# Patient Record
Sex: Female | Born: 1960
Health system: Southern US, Community
[De-identification: ages and names within clinical notes are randomized; demographics above are authoritative.]

## PROBLEM LIST (undated history)

## (undated) DIAGNOSIS — F1020 Alcohol dependence, uncomplicated: Secondary | ICD-10-CM

## (undated) DIAGNOSIS — Z87898 Personal history of other specified conditions: Secondary | ICD-10-CM

## (undated) DIAGNOSIS — F17200 Nicotine dependence, unspecified, uncomplicated: Secondary | ICD-10-CM

## (undated) HISTORY — PX: CRANIOTOMY FOR TUMOR: SUR345

---

## 2019-12-10 ENCOUNTER — Telehealth: Payer: Self-pay | Admitting: Physician Assistant

## 2019-12-10 ENCOUNTER — Inpatient Hospital Stay (HOSPITAL_COMMUNITY): Payer: Self-pay

## 2019-12-10 ENCOUNTER — Emergency Department (HOSPITAL_COMMUNITY): Payer: Self-pay

## 2019-12-10 ENCOUNTER — Encounter (HOSPITAL_COMMUNITY): Payer: Self-pay | Admitting: Neurology

## 2019-12-10 ENCOUNTER — Inpatient Hospital Stay (HOSPITAL_COMMUNITY)
Admission: EM | Admit: 2019-12-10 | Discharge: 2019-12-14 | DRG: 065 | Disposition: A | Discharge status: Rehabilitation Facility | Payer: Self-pay | Attending: Internal Medicine | Admitting: Internal Medicine

## 2019-12-10 DIAGNOSIS — F1020 Alcohol dependence, uncomplicated: Secondary | ICD-10-CM | POA: Diagnosis present

## 2019-12-10 DIAGNOSIS — R000 Tachycardia, unspecified: Secondary | ICD-10-CM | POA: Diagnosis present

## 2019-12-10 DIAGNOSIS — R4701 Aphasia: Secondary | ICD-10-CM | POA: Diagnosis present

## 2019-12-10 DIAGNOSIS — I618 Other nontraumatic intracerebral hemorrhage: Principal | ICD-10-CM | POA: Diagnosis present

## 2019-12-10 DIAGNOSIS — Z20822 Contact with and (suspected) exposure to covid-19: Secondary | ICD-10-CM | POA: Diagnosis present

## 2019-12-10 DIAGNOSIS — F17200 Nicotine dependence, unspecified, uncomplicated: Secondary | ICD-10-CM | POA: Diagnosis present

## 2019-12-10 DIAGNOSIS — Z8669 Personal history of other diseases of the nervous system and sense organs: Secondary | ICD-10-CM

## 2019-12-10 DIAGNOSIS — F17210 Nicotine dependence, cigarettes, uncomplicated: Secondary | ICD-10-CM | POA: Diagnosis present

## 2019-12-10 DIAGNOSIS — I629 Nontraumatic intracranial hemorrhage, unspecified: Secondary | ICD-10-CM

## 2019-12-10 DIAGNOSIS — R29712 NIHSS score 12: Secondary | ICD-10-CM | POA: Diagnosis present

## 2019-12-10 DIAGNOSIS — R29810 Facial weakness: Secondary | ICD-10-CM | POA: Diagnosis present

## 2019-12-10 DIAGNOSIS — I671 Cerebral aneurysm, nonruptured: Secondary | ICD-10-CM | POA: Diagnosis present

## 2019-12-10 DIAGNOSIS — Z833 Family history of diabetes mellitus: Secondary | ICD-10-CM

## 2019-12-10 DIAGNOSIS — I610 Nontraumatic intracerebral hemorrhage in hemisphere, subcortical: Secondary | ICD-10-CM

## 2019-12-10 DIAGNOSIS — E785 Hyperlipidemia, unspecified: Secondary | ICD-10-CM | POA: Diagnosis present

## 2019-12-10 DIAGNOSIS — G9389 Other specified disorders of brain: Secondary | ICD-10-CM | POA: Diagnosis present

## 2019-12-10 DIAGNOSIS — G8191 Hemiplegia, unspecified affecting right dominant side: Secondary | ICD-10-CM | POA: Diagnosis present

## 2019-12-10 DIAGNOSIS — R471 Dysarthria and anarthria: Secondary | ICD-10-CM | POA: Diagnosis present

## 2019-12-10 DIAGNOSIS — I619 Nontraumatic intracerebral hemorrhage, unspecified: Secondary | ICD-10-CM | POA: Diagnosis present

## 2019-12-10 DIAGNOSIS — I161 Hypertensive emergency: Secondary | ICD-10-CM | POA: Diagnosis present

## 2019-12-10 DIAGNOSIS — Z8673 Personal history of transient ischemic attack (TIA), and cerebral infarction without residual deficits: Secondary | ICD-10-CM

## 2019-12-10 DIAGNOSIS — E871 Hypo-osmolality and hyponatremia: Secondary | ICD-10-CM | POA: Diagnosis present

## 2019-12-10 DIAGNOSIS — I10 Essential (primary) hypertension: Secondary | ICD-10-CM | POA: Diagnosis present

## 2019-12-10 HISTORY — DX: Alcohol dependence, uncomplicated: F10.20

## 2019-12-10 HISTORY — DX: Nicotine dependence, unspecified, uncomplicated: F17.200

## 2019-12-10 HISTORY — DX: Personal history of other specified conditions: Z87.898

## 2019-12-10 LAB — I-STAT CHEM 8, ED
BUN: 15 mg/dL (ref 6–20)
Calcium, Ion: 1.07 mmol/L — ABNORMAL LOW (ref 1.15–1.40)
Chloride: 103 mmol/L (ref 98–111)
Creatinine, Ser: 0.6 mg/dL (ref 0.44–1.00)
Glucose, Bld: 94 mg/dL (ref 70–99)
HCT: 36 % (ref 36.0–46.0)
Hemoglobin: 12.2 g/dL (ref 12.0–15.0)
Potassium: 4.7 mmol/L (ref 3.5–5.1)
Sodium: 132 mmol/L — ABNORMAL LOW (ref 135–145)
TCO2: 21 mmol/L — ABNORMAL LOW (ref 22–32)

## 2019-12-10 LAB — COMPREHENSIVE METABOLIC PANEL
ALT: 14 U/L (ref 0–44)
AST: 20 U/L (ref 15–41)
Albumin: 3.8 g/dL (ref 3.5–5.0)
Alkaline Phosphatase: 60 U/L (ref 38–126)
Anion gap: 12 (ref 5–15)
BUN: 13 mg/dL (ref 6–20)
CO2: 20 mmol/L — ABNORMAL LOW (ref 22–32)
Calcium: 9 mg/dL (ref 8.9–10.3)
Chloride: 99 mmol/L (ref 98–111)
Creatinine, Ser: 0.72 mg/dL (ref 0.44–1.00)
GFR, Estimated: >60 mL/min (ref >60)
Glucose, Bld: 98 mg/dL (ref 70–99)
Potassium: 4.3 mmol/L (ref 3.5–5.1)
Sodium: 131 mmol/L — ABNORMAL LOW (ref 135–145)
Total Bilirubin: 0.6 mg/dL (ref 0.3–1.2)
Total Protein: 6.7 g/dL (ref 6.5–8.1)

## 2019-12-10 LAB — DIFFERENTIAL
Abs Immature Granulocytes: 0.01 10*3/uL (ref 0.00–0.07)
Basophils Absolute: 0.1 10*3/uL (ref 0.0–0.1)
Basophils Relative: 1 %
Eosinophils Absolute: 0.1 10*3/uL (ref 0.0–0.5)
Eosinophils Relative: 1 %
Immature Granulocytes: 0 %
Lymphocytes Relative: 37 %
Lymphs Abs: 2.3 10*3/uL (ref 0.7–4.0)
Monocytes Absolute: 0.6 10*3/uL (ref 0.1–1.0)
Monocytes Relative: 10 %
Neutro Abs: 3.2 10*3/uL (ref 1.7–7.7)
Neutrophils Relative %: 51 %

## 2019-12-10 LAB — CBC
HCT: 35.2 % — ABNORMAL LOW (ref 36.0–46.0)
Hemoglobin: 11.6 g/dL — ABNORMAL LOW (ref 12.0–15.0)
MCH: 31.2 pg (ref 26.0–34.0)
MCHC: 33 g/dL (ref 30.0–36.0)
MCV: 94.6 fL (ref 80.0–100.0)
Platelets: 229 10*3/uL (ref 150–400)
RBC: 3.72 MIL/uL — ABNORMAL LOW (ref 3.87–5.11)
RDW: 14.2 % (ref 11.5–15.5)
WBC: 6.2 10*3/uL (ref 4.0–10.5)
nRBC: 0 % (ref 0.0–0.2)

## 2019-12-10 LAB — RESP PANEL BY RT-PCR (FLU A&B, COVID) ARPGX2
Influenza A by PCR: NEGATIVE
Influenza B by PCR: NEGATIVE
SARS Coronavirus 2 by RT PCR: NEGATIVE

## 2019-12-10 LAB — ETHANOL: Alcohol, Ethyl (B): <10 mg/dL (ref <10)

## 2019-12-10 LAB — PROTIME-INR
INR: 1 (ref 0.8–1.2)
Prothrombin Time: 12.3 seconds (ref 11.4–15.2)

## 2019-12-10 LAB — APTT: aPTT: 29 seconds (ref 24–36)

## 2019-12-10 LAB — MRSA PCR SCREENING: MRSA by PCR: NEGATIVE

## 2019-12-10 LAB — I-STAT BETA HCG BLOOD, ED (MC, WL, AP ONLY): I-stat hCG, quantitative: 6 m[IU]/mL — ABNORMAL HIGH (ref <5)

## 2019-12-10 LAB — CBG MONITORING, ED: Glucose-Capillary: 93 mg/dL (ref 70–99)

## 2019-12-10 LAB — HIV ANTIBODY (ROUTINE TESTING W REFLEX): HIV Screen 4th Generation wRfx: NONREACTIVE

## 2019-12-10 MED ORDER — LORAZEPAM 2 MG/ML IJ SOLN: 1.0000 mg | INTRAMUSCULAR | Status: DC | PRN

## 2019-12-10 MED ORDER — FOLIC ACID 1 MG PO TABS
1.0000 mg | ORAL_TABLET | Freq: Every day | ORAL | Status: DC
  Administered 2019-12-11 – 2019-12-14 (×4): 1 mg via ORAL
  Filled 2019-12-10 (×4): qty 1

## 2019-12-10 MED ORDER — ADULT MULTIVITAMIN W/MINERALS CH
1.0000 | ORAL_TABLET | Freq: Every day | ORAL | Status: DC
  Administered 2019-12-11 – 2019-12-14 (×4): 1 via ORAL
  Filled 2019-12-10 (×4): qty 1

## 2019-12-10 MED ORDER — DEXMEDETOMIDINE HCL IN NACL 400 MCG/100ML IV SOLN
0.2000 ug/kg/h | INTRAVENOUS | Status: DC
  Filled 2019-12-10: qty 100

## 2019-12-10 MED ORDER — NICOTINE 14 MG/24HR TD PT24
14.0000 mg | MEDICATED_PATCH | Freq: Every day | TRANSDERMAL | Status: DC
  Administered 2019-12-10 – 2019-12-13 (×4): 14 mg via TRANSDERMAL
  Filled 2019-12-10 (×6): qty 1

## 2019-12-10 MED ORDER — CLEVIDIPINE BUTYRATE 0.5 MG/ML IV EMUL
INTRAVENOUS | Status: AC
  Administered 2019-12-10: 2 mg/h via INTRAVENOUS
  Filled 2019-12-10: qty 50

## 2019-12-10 MED ORDER — SENNOSIDES-DOCUSATE SODIUM 8.6-50 MG PO TABS
1.0000 | ORAL_TABLET | Freq: Two times a day (BID) | ORAL | Status: DC
  Administered 2019-12-11 – 2019-12-14 (×7): 1 via ORAL
  Filled 2019-12-10 (×7): qty 1

## 2019-12-10 MED ORDER — THIAMINE HCL 100 MG PO TABS
100.0000 mg | ORAL_TABLET | Freq: Every day | ORAL | Status: DC
  Administered 2019-12-11 – 2019-12-14 (×4): 100 mg via ORAL
  Filled 2019-12-10 (×4): qty 1

## 2019-12-10 MED ORDER — CLEVIDIPINE BUTYRATE 0.5 MG/ML IV EMUL: 0.0000 mg/h | INTRAVENOUS | Status: DC

## 2019-12-10 MED ORDER — CHLORHEXIDINE GLUCONATE CLOTH 2 % EX PADS
6.0000 | MEDICATED_PAD | Freq: Every day | CUTANEOUS | Status: DC
  Administered 2019-12-10 – 2019-12-12 (×3): 6 via TOPICAL

## 2019-12-10 MED ORDER — ACETAMINOPHEN 325 MG PO TABS
650.0000 mg | ORAL_TABLET | ORAL | Status: DC | PRN
  Administered 2019-12-12 – 2019-12-13 (×2): 650 mg via ORAL
  Filled 2019-12-10 (×2): qty 2

## 2019-12-10 MED ORDER — STROKE: EARLY STAGES OF RECOVERY BOOK: Freq: Once | Status: DC

## 2019-12-10 MED ORDER — ACETAMINOPHEN 650 MG RE SUPP: 650.0000 mg | RECTAL | Status: DC | PRN

## 2019-12-10 MED ORDER — ACETAMINOPHEN 160 MG/5ML PO SOLN: 650.0000 mg | ORAL | Status: DC | PRN

## 2019-12-10 MED ORDER — PANTOPRAZOLE SODIUM 40 MG IV SOLR
40.0000 mg | Freq: Every day | INTRAVENOUS | Status: DC
  Administered 2019-12-10: 40 mg via INTRAVENOUS

## 2019-12-10 NOTE — Code Documentation (Addendum)
Stroke Response Nurse Documentation Code Documentation  Melissa Colon is a 59 y.o. female arriving to [facility] ED via [facility] on 12/10/2019. Code stroke was activated by ED. Patient from home where she was LKW at 0430 and now complaining of Right hemiparesis. On No antithrombotic. Stroke team at the bedside on patient arrival. Labs drawn and patient cleared for CT by Dr. Hyacinth Meeker. Patient to CT with team. NIHSS 17, see documentation for details and code stroke times. Patient with left gaze preference , right hemianopia, right facial droop, right arm weakness, right leg weakness, right decreased sensation, Expressive aphasia , dysarthria  and right neglect on exam. The following imaging was completed:  CT. Patient is not a candidate for tPA due to Intercranial hemorrhage.  20mg  Labetalol given IV and Cleviprex gtt started for BP control. Care/Plan. Bedside handoff with ED RN.     Stroke Response RN

## 2019-12-10 NOTE — Telephone Encounter (Signed)
   Received page to the answering service from "April Manyon" phone [phone] regarding this patient. Pager states "regarding cold stroke." Patient does not to appear to be a patient on file. Chart review indicates the patient is actively being evaluated in the [facility] emergency department room 17 for a code stroke.  I tried to call the number back but got voicemail. LMOM to call us back if needed. I called our answering service to get more information - they listened to the message and April had identified herself as an Dance movement psychotherapist. I tried to call April's number again and she answered. She stated they'd had our phone number in their truck as a number they could call to notify about a code stroke but were mistaken. They have since dropped off the patient for evaluation and no further action is needed from our end.  Laurann Montana, PA-C

## 2019-12-10 NOTE — ED Notes (Signed)
20mg  labetalol given per MD verbal order

## 2019-12-10 NOTE — ED Provider Notes (Signed)
[facility] EMERGENCY DEPARTMENT Provider Note   CSN: [id number] Arrival date & time: 12/10/19  1457     History Chief Complaint  Patient presents with  . Code Stroke    Melissa Colon is a 59 y.o. female.  HPI   59 year old female, there is no clear medical history, there is no prior medical records on this patient, but according to the paramedics evaluation at the scene the patient was last seen around 4:30 in the morning by a child, she was unresponsive with right-sided weakness when they next saw her a short time ago.  The patient cannot speak, she has dense hemiparesis, level 5 caveat applies.  No past medical history on file.  There are no problems to display for this patient.     OB History   No obstetric history on file.     No family history on file.  Social History   Tobacco Use  . Smoking status: Not on file  Substance Use Topics  . Alcohol use: Not on file  . Drug use: Not on file    Home Medications Prior to Admission medications   Not on File    Allergies    Patient has no allergy information on record.  Review of Systems   Review of Systems  Unable to perform ROS: Acuity of condition    Physical Exam Updated Vital Signs There were no vitals taken for this visit.  Physical Exam Vitals and nursing note reviewed.  Constitutional:      General: She is in acute distress.     Appearance: She is well-developed.  HENT:     Head: Normocephalic and atraumatic.     Mouth/Throat:     Pharynx: No oropharyngeal exudate.  Eyes:     General: No scleral icterus.       Right eye: No discharge.        Left eye: No discharge.     Conjunctiva/sclera: Conjunctivae normal.     Pupils: Pupils are equal, round, and reactive to light.  Neck:     Thyroid: No thyromegaly.     Vascular: No JVD.     Comments: No carotid bruit auscultated Cardiovascular:     Rate and Rhythm: Normal rate and regular rhythm.     Heart sounds: Normal  heart sounds. No murmur heard.  No friction rub. No gallop.   Pulmonary:     Effort: Pulmonary effort is normal. No respiratory distress.     Breath sounds: Normal breath sounds. No wheezing or rales.  Abdominal:     General: Bowel sounds are normal. There is no distension.     Palpations: Abdomen is soft. There is no mass.     Tenderness: There is no abdominal tenderness.  Musculoskeletal:        General: No tenderness. Normal range of motion.     Cervical back: Normal range of motion and neck supple.     Right lower leg: No edema.     Left lower leg: No edema.  Lymphadenopathy:     Cervical: No cervical adenopathy.  Skin:    General: Skin is warm and dry.     Findings: No erythema or rash.  Neurological:     Mental Status: She is alert.     Coordination: Coordination normal.     Comments: The patient has right-sided facial droop, she has no gaze preference, she is unable to answer questions or speak at all, she has right arm weakness though she  does have a mild preserved grip, she cannot lift her arm off the bed, she has no movement in the right leg.  Left lower and upper extremity are normal  Psychiatric:        Behavior: Behavior normal.     ED Results / Procedures / Treatments   Labs (all labs ordered are listed, but only abnormal results are displayed) Labs Reviewed  CBC - Abnormal; Notable for the following components:      Result Value   RBC 3.72 (*)    Hemoglobin 11.6 (*)    HCT 35.2 (*)    All other components within normal limits  I-STAT CHEM 8, ED - Abnormal; Notable for the following components:   Sodium 132 (*)    Calcium, Ion 1.07 (*)    TCO2 21 (*)    All other components within normal limits  I-STAT BETA HCG BLOOD, ED (MC, WL, AP ONLY) - Abnormal; Notable for the following components:   I-stat hCG, quantitative 6.0 (*)    All other components within normal limits  RESP PANEL BY RT-PCR (FLU A&B, COVID) ARPGX2  ETHANOL  PROTIME-INR  APTT  DIFFERENTIAL   COMPREHENSIVE METABOLIC PANEL  RAPID URINE DRUG SCREEN, HOSP PERFORMED  URINALYSIS, ROUTINE W REFLEX MICROSCOPIC  HIV ANTIBODY (ROUTINE TESTING W REFLEX)  CBG MONITORING, ED    EKG EKG Interpretation  Date/Time:  Thursday December 10 2019 15:29:12 EST Ventricular Rate:  88 PR Interval:    QRS Duration: 89 QT Interval:  364 QTC Calculation: 441 R Axis:   81 Text Interpretation: Sinus rhythm No old tracing to compare Confirmed by Noemi Chapel [phone]) on 12/10/2019 3:33:53 PM   Radiology CT HEAD CODE STROKE WO CONTRAST  Result Date: 12/10/2019 CLINICAL DATA:  Code stroke.  Neurological deficit, not specified. EXAM: CT HEAD WITHOUT CONTRAST TECHNIQUE: Contiguous axial images were obtained from the base of the skull through the vertex without intravenous contrast. COMPARISON:  None. FINDINGS: Brain: Distant right occipital craniectomy. Encephalomalacia the right cerebellar hemisphere. Chronic calcification of the left cerebellar nuclei. Acute intraparenchymal hemorrhage in the left thalamus measuring approximately 2.5 x 1.3 x 2.1 Cm (volume = 3.6 cm^3) mild surrounding edema. Elsewhere, cerebral hemispheres show generalized atrophy, markedly advanced for age. Chronic small-vessel ischemic changes of the deep white matter. Old bilateral occipital insults with dystrophic calcification. 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 No hydrocephalus or extra-axial collection. Vascular: There is atherosclerotic calcification of the major vessels at the base of the brain. Skull: Right occipital craniectomy as previously noted. Sinuses/Orbits: Clear/normal Other: None ASPECTS ([place] Stroke Program Early CT Score) - Ganglionic level infarction (caudate, lentiform nuclei, internal capsule, insula, M1-M3 cortex): 7 - Supraganglionic infarction (M4-M6 cortex): 3 Total score (0-10 with 10 being normal): 10 IMPRESSION: 1. Acute intraparenchymal hemorrhage in the left thalamus measuring  approximately 2.5 x 1.3 x 2.1 cm (volume 3.6 cm^3). Mild surrounding edema. 2. Old bilateral occipital insults with dystrophic calcification. 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 Chronic small-vessel changes of the hemispheric white matter. 3. Distant right occipital craniectomy. Encephalomalacia the right cerebellar hemisphere. 4. ASPECTS is 10. 5. These results were communicated to Dr. Lorrin Goodell At 3:12 pmon 11/25/2021by text page via the [facility] messaging system. Electronically Signed   By: Nelson Chimes M.D.   On: 12/10/2019 15:14    Procedures .Critical Care Performed by: Noemi Chapel, MD Authorized by: Noemi Chapel, MD   Critical care provider statement:    Critical care time (minutes):  35   Critical  care time was exclusive of:  Separately billable procedures and treating other patients and teaching time   Critical care was necessary to treat or prevent imminent or life-threatening deterioration of the following conditions:  CNS failure or compromise   Critical care was time spent personally by me on the following activities:  Blood draw for specimens, development of treatment plan with patient or surrogate, discussions with consultants, evaluation of patient's response to treatment, examination of patient, obtaining history from patient or surrogate, ordering and performing treatments and interventions, ordering and review of laboratory studies, ordering and review of radiographic studies, pulse oximetry, re-evaluation of patient's condition and review of old charts   (including critical care time)  Medications Ordered in ED Medications  clevidipine (CLEVIPREX) 0.5 MG/ML infusion (has no administration in time range)    ED Course  I have reviewed the triage vital signs and the nursing notes.  Pertinent labs & imaging results that were available during my care of the patient were reviewed by me and considered in my medical decision making (see chart for details).     MDM Rules/Calculators/A&P                          This patient is critically ill and activated as a code stroke on arrival, she met criteria for large vessel occlusion however when she went to the CT scanner it was clear that she had an intracranial hemorrhage of her left thalamus.  This explains the patient's right-sided deficits.  Neurology is at the bedside recommending Cleviprex for strict blood pressure control, she will need to go to the ICU this evening.  Labs pending  Pt EKG is normal  Pt is now speaking as best she can - answering questions with some slurred speech - occasionally having trouble findings words, BP is currently around 013 systolic on the cleviprex drip.  White blood cell count is normal, hemoglobin 11.6, lytes are normal.  Pt states not on daily meds - no ASA / no thinners, no headaches.  Final Clinical Impression(s) / ED Diagnoses Final diagnoses:  Intracranial hemorrhage ([place])  Hemorrhagic stroke [facility])    Rx / DC Orders ED Discharge Orders    None       Noemi Chapel, MD 12/10/19 1537

## 2019-12-10 NOTE — H&P (Addendum)
Admission H&P    Chief Complaint: code stroke, right sided weakness and aphasia  HPI: Melissa BailiffDebbie Colon is an 59 y.o. 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 Apparently, the code stroke was not called or there was a glitch in the system. Neuro team was activated by ED charge nurse and pt was already in CT scan.  Pt has right sided weakness and left gaze preference. She is dysarthric but not aphasic. She is able to answer questions correctly.  The patient denied being on any blood thinners at home. NP went through entire list of blood thinners and she answered no.   LKN: 0430 tPA Given: no, left thalamus hemorrhage noted on CT.  ICH score is 0 NIHSS: 12   Past Medical History:  Diagnosis Date  . Alcohol dependence (HCC)   . H/O brain tumor   . Smoker     Family History  Problem Relation Age of Onset  . Diabetes Mother      Social History:  reports that she has been smoking. She has never used smokeless tobacco. She reports current alcohol use of about 8.0 standard drinks of alcohol per week. No history on file for drug use.  Allergies: No Known Allergies  (Not in a hospital admission)   ROS: Right sided weakness  Physical Examination: Blood pressure 122/66, pulse 81, temperature 98.2 F (36.8 C), temperature source Oral, resp. rate 16, SpO2 100 %.  HEENT-  Normocephalic, no lesions, without obvious abnormality.  Normal external eye and conjunctiva. Normal external ears. Normal external nose, mucus membranes. Normal pharynx. Neck supple with no masses, nodes, nodules or enlargement. Cardiovascular - RRR  Lungs - normal respiratory effort.  Abdomen -soft Extremities -warm, good perfusion  Neurologic Examination: II-PERRL III, IV, VI-ocular mobility normal with left gaze preference but able to cross midline. VSensation in intact BL VII-smile is not symmetrical  with noted right droop.  VIII-hearing intact to voice IX, X-palate elevates, phonation intact with dysarthria XI- XII-tongue is midline without fasciculations. Motor-Moves left side spontaneously. No spontaneous movements RUE, only able to wiggles her toes in RLE. Babinski does not provoke right leg movement. Bulk is normal. RUE and RLE tone was flaccid.  DTRs 1+ all over and symmetric. Cerebellar: no tremors, no ataxia. Gait-deferred.   Results for orders placed or performed during the hospital encounter of 12/10/19 (from the past 48 hour(s))  CBG monitoring, ED     Status: None   Collection Time: 12/10/19  3:00 PM  Result Value Ref Range   Glucose-Capillary 93 70 - 99 mg/dL    Comment: Glucose reference range applies only to samples taken after fasting for at least 8 hours.  Ethanol     Status: None   Collection Time: 12/10/19  3:01 PM  Result Value Ref Range   Alcohol, Ethyl (B) <10 <10 mg/dL    Comment: (NOTE) Lowest detectable limit for serum alcohol is 10 mg/dL.  For medical purposes only. Performed at [facility], [address] [place], [place] [address]   Protime-INR     Status: None   Collection Time: 12/10/19  3:01 PM  Result Value Ref Range   Prothrombin Time 12.3 11.4 - 15.2 seconds   INR 1.0 0.8 - 1.2    Comment: (NOTE) INR goal varies based on device and disease states. Performed at [facility], [address] [place], [place] [address]   APTT     Status: None   Collection Time: 12/10/19  3:01 PM  Result Value Ref Range   aPTT 29 24 - 36 seconds    Comment: Performed at [facility], [address], [place], [place] [address]  CBC     Status: Abnormal   Collection Time: 12/10/19  3:01 PM  Result Value Ref Range   WBC 6.2 4.0 - 10.5 K/uL   RBC 3.72 (L) 3.87 - 5.11 MIL/uL   Hemoglobin 11.6 (L) 12.0 - 15.0 g/dL   HCT 50.0 (L) 36 - 46 %   MCV 94.6 80.0 - 100.0 fL   MCH 31.2 26.0 - 34.0 pg   MCHC 33.0 30.0 - 36.0 g/dL   RDW 37.0 48.8  - 89.1 %   Platelets 229 150 - 400 K/uL   nRBC 0.0 0.0 - 0.2 %    Comment: Performed at [facility], [address] [place], [place] [address]  Differential     Status: None   Collection Time: 12/10/19  3:01 PM  Result Value Ref Range   Neutrophils Relative % 51 %   Neutro Abs 3.2 1.7 - 7.7 K/uL   Lymphocytes Relative 37 %   Lymphs Abs 2.3 0.7 - 4.0 K/uL   Monocytes Relative 10 %   Monocytes Absolute 0.6 0.1 - 1.0 K/uL   Eosinophils Relative 1 %   Eosinophils Absolute 0.1 0.0 - 0.5 K/uL   Basophils Relative 1 %   Basophils Absolute 0.1 0.0 - 0.1 K/uL   Immature Granulocytes 0 %   Abs Immature Granulocytes 0.01 0.00 - 0.07 K/uL    Comment: Performed at [facility], [address], [place], [place] [address]  Comprehensive metabolic panel     Status: Abnormal   Collection Time: 12/10/19  3:01 PM  Result Value Ref Range   Sodium 131 (L) 135 - 145 mmol/L   Potassium 4.3 3.5 - 5.1 mmol/L   Chloride 99 98 - 111 mmol/L   CO2 20 (L) 22 - 32 mmol/L   Glucose, Bld 98 70 - 99 mg/dL    Comment: Glucose reference range applies only to samples taken after fasting for at least 8 hours.   BUN 13 6 - 20 mg/dL   Creatinine, Ser 8.00 0.44 - 1.00 mg/dL   Calcium 9.0 8.9 - 34.9 mg/dL   Total Protein 6.7 6.5 - 8.1 g/dL   Albumin 3.8 3.5 - 5.0 g/dL   AST 20 15 - 41 U/L   ALT 14 0 - 44 U/L   Alkaline Phosphatase 60 38 - 126 U/L   Total Bilirubin 0.6 0.3 - 1.2 mg/dL   GFR, Estimated >17 >91 mL/min    Comment: (NOTE) Calculated using the CKD-EPI Creatinine Equation (2021)    Anion gap 12 5 - 15    Comment: Performed at [facility], [address] [place], [place] [address]  I-Stat beta hCG blood, ED     Status: Abnormal   Collection Time: 12/10/19  3:05 PM  Result Value Ref Range   I-stat hCG, quantitative 6.0 (H) <5 mIU/mL   Comment 3            Comment:   GEST. AGE      CONC.  (mIU/mL)   <=1 WEEK        5 - 50     2 WEEKS       50 - 500  3 WEEKS       100 -  10,000     4 WEEKS     1,000 - 30,000        FEMALE AND NON-PREGNANT FEMALE:     LESS THAN 5 mIU/mL   I-stat chem 8, ED     Status: Abnormal   Collection Time: 12/10/19  3:10 PM  Result Value Ref Range   Sodium 132 (L) 135 - 145 mmol/L   Potassium 4.7 3.5 - 5.1 mmol/L   Chloride 103 98 - 111 mmol/L   BUN 15 6 - 20 mg/dL   Creatinine, Ser 3.81 0.44 - 1.00 mg/dL   Glucose, Bld 94 70 - 99 mg/dL    Comment: Glucose reference range applies only to samples taken after fasting for at least 8 hours.   Calcium, Ion 1.07 (L) 1.15 - 1.40 mmol/L   TCO2 21 (L) 22 - 32 mmol/L   Hemoglobin 12.2 12.0 - 15.0 g/dL   HCT 01.7 36 - 46 %   CT HEAD CODE STROKE WO CONTRAST  Result Date: 12/10/2019 CLINICAL DATA:  Code stroke.  Neurological deficit, not specified. EXAM: CT HEAD WITHOUT CONTRAST TECHNIQUE: Contiguous axial images were obtained from the base of the skull through the vertex without intravenous contrast. COMPARISON:  None. FINDINGS: Brain: Distant right occipital craniectomy. Encephalomalacia the right cerebellar hemisphere. Chronic calcification of the left cerebellar nuclei. Acute intraparenchymal hemorrhage in the left thalamus measuring approximately 2.5 x 1.3 x 2.1 Cm (volume = 3.6 cm^3) mild surrounding edema. Elsewhere, cerebral hemispheres show generalized atrophy, markedly advanced for age. Chronic small-vessel ischemic changes of the deep white matter. Old bilateral occipital insults with dystrophic calcification. 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 No hydrocephalus or extra-axial collection. Vascular: There is atherosclerotic calcification of the major vessels at the base of the brain. Skull: Right occipital craniectomy as previously noted. Sinuses/Orbits: Clear/normal Other: None ASPECTS ([place] Stroke Program Early CT Score) - Ganglionic level infarction (caudate, lentiform nuclei, internal capsule, insula, M1-M3 cortex): 7 - Supraganglionic infarction (M4-M6 cortex): 3  Total score (0-10 with 10 being normal): 10 IMPRESSION: 1. Acute intraparenchymal hemorrhage in the left thalamus measuring approximately 2.5 x 1.3 x 2.1 cm (volume 3.6 cm^3). Mild surrounding edema. 2. Old bilateral occipital insults with dystrophic calcification. 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 Chronic small-vessel changes of the hemispheric white matter. 3. Distant right occipital craniectomy. Encephalomalacia the right cerebellar hemisphere. 4. ASPECTS is 10. 5. These results were communicated to Dr. Derry Lory At 3:12 pmon 11/25/2021by text page via the [facility] messaging system. Electronically Signed   By: Paulina Fusi M.D.   On: 12/10/2019 15:14    Assessment: 59 y.o. female presenting with L thalamic hemorrhage about 3.6cc on CT Head with an ICH score of 0. No anticoagulation.  Left Thalamus ICH with an ICH score of 0. - HgbA1c, fasting lipid panel -  MRI, MRA  of the brain without contrast - PT consult, OT consult, Speech consult - Echocardiogram - Carotid dopplers - SBP less than 140-on Cleveprex, Platelets above 100K and INR less than 1.4 (is 1.0). - Telemetry - NPO until swallow eval - Repeat CTH in 6 hours, around 9pm.  EtOH dependence: Drinks 8 beers a day - CIWA protocol with Thiamine - Counseled on the improtance of quitting  Smoker: - Nicotine patch PRN   Note reviewed and edited by Dr. Rutha Bouchard Jimmye Norman, NP 12/10/2019, 4:47 PM   This patient is critically ill and at significant risk  of neurological worsening, death and care requires constant monitoring of vital signs, hemodynamics,respiratory and cardiac monitoring, neurological assessment, discussion with family, other specialists and medical decision making of high complexity. I spent 40 minutes of neurocritical care time  in the care of  this patient. This was time spent independent of any time provided by nurse practitioner or PA.  Erick Blinks [facility] Pager Number  [phone] 12/10/2019  4:53 PM

## 2019-12-11 ENCOUNTER — Inpatient Hospital Stay (HOSPITAL_COMMUNITY): Payer: Self-pay

## 2019-12-11 DIAGNOSIS — I6389 Other cerebral infarction: Secondary | ICD-10-CM

## 2019-12-11 DIAGNOSIS — I161 Hypertensive emergency: Secondary | ICD-10-CM | POA: Diagnosis present

## 2019-12-11 DIAGNOSIS — F1020 Alcohol dependence, uncomplicated: Secondary | ICD-10-CM | POA: Diagnosis present

## 2019-12-11 DIAGNOSIS — F17200 Nicotine dependence, unspecified, uncomplicated: Secondary | ICD-10-CM | POA: Diagnosis present

## 2019-12-11 DIAGNOSIS — I340 Nonrheumatic mitral (valve) insufficiency: Secondary | ICD-10-CM

## 2019-12-11 DIAGNOSIS — I610 Nontraumatic intracerebral hemorrhage in hemisphere, subcortical: Secondary | ICD-10-CM

## 2019-12-11 LAB — URINALYSIS, ROUTINE W REFLEX MICROSCOPIC
Bilirubin Urine: NEGATIVE
Glucose, UA: NEGATIVE mg/dL
Hgb urine dipstick: NEGATIVE
Ketones, ur: NEGATIVE mg/dL
Leukocytes,Ua: NEGATIVE
Nitrite: NEGATIVE
Protein, ur: NEGATIVE mg/dL
Specific Gravity, Urine: 1.012 (ref 1.005–1.030)
pH: 6 (ref 5.0–8.0)

## 2019-12-11 LAB — RAPID URINE DRUG SCREEN, HOSP PERFORMED
Amphetamines: NOT DETECTED
Barbiturates: NOT DETECTED
Benzodiazepines: NOT DETECTED
Cocaine: NOT DETECTED
Opiates: NOT DETECTED
Tetrahydrocannabinol: NOT DETECTED

## 2019-12-11 LAB — ECHOCARDIOGRAM COMPLETE
Area-P 1/2: 2.56 cm2
S' Lateral: 2.5 cm
Weight: 1760.15 oz

## 2019-12-11 MED ORDER — PANTOPRAZOLE SODIUM 40 MG PO TBEC
40.0000 mg | DELAYED_RELEASE_TABLET | Freq: Every day | ORAL | Status: DC
  Administered 2019-12-11 – 2019-12-13 (×3): 40 mg via ORAL
  Filled 2019-12-11 (×3): qty 1

## 2019-12-11 MED ORDER — HYDRALAZINE HCL 20 MG/ML IJ SOLN: 10.0000 mg | Freq: Three times a day (TID) | INTRAMUSCULAR | Status: DC | PRN

## 2019-12-11 MED ORDER — AMLODIPINE BESYLATE 5 MG PO TABS
5.0000 mg | ORAL_TABLET | Freq: Every day | ORAL | Status: DC
  Administered 2019-12-11 – 2019-12-14 (×4): 5 mg via ORAL
  Filled 2019-12-11 (×4): qty 1

## 2019-12-11 MED ORDER — LABETALOL HCL 5 MG/ML IV SOLN
20.0000 mg | INTRAVENOUS | Status: DC | PRN
  Administered 2019-12-11: 20 mg via INTRAVENOUS
  Filled 2019-12-11: qty 4

## 2019-12-11 NOTE — Evaluation (Signed)
Physical Therapy Evaluation Patient Details Name: Melissa Colon MRN: [id number] DOB: 1960/08/01 Today's Date: 12/11/2019   History of Present Illness  59 yo female admitted to ED on 11/25 with dense R hemiparesis, Imaging reveals ICH in L thalamus. 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  Clinical Impression   Pt presents with R sided weakness, impaired RUE/LE coordination, difficulty performing mobility tasks, impaired sitting and standing balance, and decreased activity tolerance. Pt to benefit from acute PT to address deficits. Pt ambulated short room distance, requiring min-mod +2 for all mobility very limited by RLE incoordination and impaired balance in standing activity. PT recommending CIR post-acutely to maximize functional recovery. PT to progress mobility as tolerated, and will continue to follow acutely.     Follow Up Recommendations CIR    Equipment Recommendations  Other (comment) (TBD next session)    Recommendations for Other Services       Precautions / Restrictions Precautions Precautions: (P) Fall Precaution Comments: (P) R hemiparesis and incoordination Restrictions Weight Bearing Restrictions: No      Mobility  Bed Mobility Overal bed mobility: Needs Assistance Bed Mobility: Supine to Sit     Supine to sit: Min assist;HOB elevated;+2 for safety/equipment     General bed mobility comments: Min assist for completing translation of RLE to EOB, placing RUE next to trunk.    Transfers Overall transfer level: Needs assistance Equipment used: 2 person hand held assist Transfers: Sit to/from Stand Sit to Stand: Mod assist;+2 physical assistance         General transfer comment: Mod +2 for power up, steadying, and RLE blocking. R lateral leaning corrected with VCs.  Ambulation/Gait Ambulation/Gait assistance: Mod assist;+2 physical assistance Gait Distance (Feet): 5 Feet Assistive device: 2 person hand  held assist Gait Pattern/deviations: Step-to pattern;Decreased step length - right;Decreased weight shift to right;Narrow base of support Gait velocity: decr   General Gait Details: Mod +2 for steadying, weight shifting L and R for forward progression LEs, RLE blocking during RLE stance phase, physically assisting RLE during swing phase especially with fatigue. VC for upright posture as pt with preference for R lateral leaning, placing R foot flat during R stance as pt holds foot in supination. Very incoordinated R swing phase.  Stairs            Wheelchair Mobility    Modified Rankin (Stroke Patients Only) Modified Rankin (Stroke Patients Only) Pre-Morbid Rankin Score: No symptoms Modified Rankin: Moderately severe disability     Balance Overall balance assessment: Needs assistance Sitting-balance support: No upper extremity supported;Feet supported Sitting balance-Leahy Scale: Fair Sitting balance - Comments: posterior leaning with donning socks, requiring min-mod assist to correct Postural control: Posterior lean Standing balance support: Bilateral upper extremity supported;During functional activity Standing balance-Leahy Scale: Poor Standing balance comment: reliant on external assist, R lateral leaning                             Pertinent Vitals/Pain Pain Assessment: (P) No/denies pain    Home Living Family/patient expects to be discharged to:: (P) Private residence Living Arrangements: (P) Spouse/significant other Available Help at Discharge: (P) Family;Available 24 hours/day Type of Home: (P) Mobile home Home Access: (P) Stairs to enter Entrance Stairs-Rails: (P) Right;Left Entrance Stairs-Number of Steps: (P) 5 Home Layout: (P) One level Home Equipment: (P) Cane - single point Additional Comments: (P) Lives with her spouse, son, brother in Social worker and sister in Social worker.  Pt reports that none of them work during the day, and can be available to assist her as  needed     Prior Function Level of Independence: (P) Independent         Comments: (P) Pt reports she was fully independent.  Does not drive, does not work      Higher education careers adviser   Dominant Hand: (P) Right    Extremity/Trunk Assessment   Upper Extremity Assessment Upper Extremity Assessment: (P) RUE deficits/detail RUE Deficits / Details: (P) full AROM, however, she demonstrates significant deficits with proprioception.  She is unable to modulate the amount of force she needs to perform a task.  overshoots and undershoots - ataxia noted  RUE Sensation: (P) decreased light touch;decreased proprioception RUE Coordination: (P) decreased fine motor;decreased gross motor    Lower Extremity Assessment Lower Extremity Assessment:  RLE Deficits / Details: at least 3/5 strength knee flex/ext, hip flex/ex, DF/PF. Pt limited by RLE incoordination: + heel-to-shin incoordination, poor proprioceptive control in swing and stance phase of gait RLE Sensation: decreased proprioception;decreased light touch RLE Coordination: decreased gross motor;decreased fine motor    Cervical / Trunk Assessment Cervical / Trunk Assessment: (P) Normal  Communication   Communication: (P) Expressive difficulties  Cognition Arousal/Alertness: (P) Awake/alert Behavior During Therapy: (P) WFL for tasks assessed/performed;Anxious Overall Cognitive Status: (P) Impaired/Different from baseline Area of Impairment: (P) Orientation;Following commands;Safety/judgement;Problem solving                 Orientation Level: (P) Disoriented to;Place     Following Commands: (P) Follows one step commands with increased time Safety/Judgement: (P) Decreased awareness of safety   Problem Solving: (P) Slow processing;Decreased initiation;Requires verbal cues;Requires tactile cues;Difficulty sequencing General Comments: (P) Pt oriented to being in a hospital, not [facility] or [facility]. Pt requires increased time and effort to  verbally and physically respond to questions and mobility commands, respectively. Demonstrates decreased responsiveness and command following when up, suspect due to anxiety.      General Comments General comments (skin integrity, edema, etc.): vss    Exercises     Assessment/Plan    PT Assessment Patient needs continued PT services  PT Problem List Decreased strength;Decreased mobility;Decreased activity tolerance;Decreased balance;Decreased coordination;Decreased cognition;Decreased knowledge of use of DME;Impaired sensation       PT Treatment Interventions DME instruction;Therapeutic activities;Gait training;Therapeutic exercise;Patient/family education;Balance training;Functional mobility training;Neuromuscular re-education    PT Goals (Current goals can be found in the Care Plan section)  Acute Rehab PT Goals Patient Stated Goal: get better PT Goal Formulation: With patient Time For Goal Achievement: 12/25/19 Potential to Achieve Goals: Good    Frequency Min 4X/week   Barriers to discharge        Co-evaluation PT/OT/SLP Co-Evaluation/Treatment: Yes Reason for Co-Treatment: (P) For patient/therapist safety;To address functional/ADL transfers PT goals addressed during session: Mobility/safety with mobility;Balance OT goals addressed during session: (P) ADL's and self-care       AM-PAC PT "6 Clicks" Mobility  Outcome Measure Help needed turning from your back to your side while in a flat bed without using bedrails?: A Little Help needed moving from lying on your back to sitting on the side of a flat bed without using bedrails?: A Little Help needed moving to and from a bed to a chair (including a wheelchair)?: A Lot Help needed standing up from a chair using your arms (e.g., wheelchair or bedside chair)?: A Lot Help needed to walk in hospital room?: A Lot Help needed climbing 3-5 steps with a railing? :  Total 6 Click Score: 13    End of Session Equipment Utilized  During Treatment: Gait belt Activity Tolerance: Patient limited by fatigue Patient left: in chair;with call bell/phone within reach;with chair alarm set Nurse Communication: Mobility status PT Visit Diagnosis: Other abnormalities of gait and mobility (R26.89);Hemiplegia and hemiparesis Hemiplegia - Right/Left: Right Hemiplegia - dominant/non-dominant: Dominant Hemiplegia - caused by: Nontraumatic intracerebral hemorrhage    Time: 1011-1040 PT Time Calculation (min) (ACUTE ONLY): 29 min   Charges:   PT Evaluation $PT Eval Moderate Complexity: 1 Mod           E, PT Acute Rehabilitation Services Pager [phone]  Office [phone]     D  12/11/2019, 11:30 AM

## 2019-12-11 NOTE — Progress Notes (Signed)
STROKE TEAM PROGRESS NOTE   INTERVAL HISTORY I personally reviewed history of presenting illness, electronic medical records and imaging films in PACS.  She presented with sudden onset of slurred speech and right hemiparesis and CT scan of the head showed 2.5 x 1.3 x 2.1 cm left thalamic hemorrhage with mild cytotoxic edema.  Old encephalomalacia in the right cerebellar hemisphere from prior occipital craniectomy for tumor.  Blood pressure was mildly elevated and she briefly required Cardene drip is presently of.  He states she is improving but still has mild dysarthria and right hemiparesis She denies any prior history of hypertension and was on no medications prior to admission.  Urine drug screen was negative Vitals:   12/11/19 0500 12/11/19 0600 12/11/19 0700 12/11/19 0800  BP: 139/70 138/73 139/66 140/83  Pulse: 60 65 (!) 57 79  Resp: 14 14 12 15   Temp:      TempSrc:      SpO2: 100% 100% 100% 98%  Weight:       CBC:  Recent Labs  Lab 12/10/19 1501 12/10/19 1510  WBC 6.2  --   NEUTROABS 3.2  --   HGB 11.6* 12.2  HCT 35.2* 36.0  MCV 94.6  --   PLT 229  --    Basic Metabolic Panel:  Recent Labs  Lab 12/10/19 1501 12/10/19 1510  NA 131* 132*  K 4.3 4.7  CL 99 103  CO2 20*  --   GLUCOSE 98 94  BUN 13 15  CREATININE 0.72 0.60  CALCIUM 9.0  --    Lipid Panel: No results for input(s): CHOL, TRIG, HDL, CHOLHDL, VLDL, LDLCALC in the last 168 hours. HgbA1c: No results for input(s): HGBA1C in the last 168 hours. Urine Drug Screen:  Recent Labs  Lab 12/11/19 0620  LABOPIA NONE DETECTED  COCAINSCRNUR NONE DETECTED  LABBENZ NONE DETECTED  AMPHETMU NONE DETECTED  THCU NONE DETECTED  LABBARB NONE DETECTED    Alcohol Level  Recent Labs  Lab 12/10/19 1501  ETH <10    IMAGING past 24 hours CT HEAD WO CONTRAST  Result Date: 12/10/2019 CLINICAL DATA:  Intracranial hemorrhage.  Headache. EXAM: CT HEAD WITHOUT CONTRAST TECHNIQUE: Contiguous axial images were obtained  from the base of the skull through the vertex without intravenous contrast. COMPARISON:  CT head without contrast 12/10/2019. MR head without contrast 12/10/2019 FINDINGS: Brain: Left thalamic hemorrhage is stable, measuring 2.4 cm maximally. Surrounding edema is as expected. No new hemorrhage or expansion is present. Moderate atrophy and white matter changes are present. Remote infarct of the left parietal lobe is again noted. Postsurgical encephalomalacia is present in the right cerebellum. Left cerebellar calcifications are stable. The ventricles are proportionate to the degree of atrophy. No significant extraaxial fluid collection is present. Vascular: Atherosclerotic calcifications are present within the cavernous internal carotid arteries. No hyperdense vessel is present. Skull: Right occipital craniotomy is noted. Left parietal burr hole noted. Calvarium otherwise intact. No significant extracranial soft tissue lesion is present. Sinuses/Orbits: The paranasal sinuses and mastoid air cells are clear. The globes and orbits are within normal limits. IMPRESSION: 1. Stable left thalamic hemorrhage. 2. No new hemorrhage or expansion. 3. Stable atrophy and white matter disease. 4. Remote infarct of the left parietal lobe. 5. Postsurgical encephalomalacia in the right cerebellum. Electronically Signed   By: 12/12/2019 M.D.   On: 12/10/2019 21:23   MR MRA HEAD WO CONTRAST  Result Date: 12/10/2019 CLINICAL DATA:  Code stroke. Neurologic deficit. Cerebral hemorrhage. Right hemiparesis.  Left gaze preference. EXAM: MRI HEAD WITHOUT CONTRAST MRA HEAD WITHOUT CONTRAST TECHNIQUE: Multiplanar, multiecho pulse sequences of the brain and surrounding structures were obtained without intravenous contrast. Angiographic images of the head were obtained using MRA technique without contrast. COMPARISON:  None. CT head without contrast 12/10/2019 FINDINGS: MRI HEAD FINDINGS Brain: Acute hemorrhage in the left thalamus  measuring 2.4 cm maximally is stable. Surrounding edema extends into the left cerebral peduncle and superiorly into the posterior limb left internal capsule. There is some focal mass effect. No additional hemorrhage is evident. Mass lesion is evident. Moderate generalized atrophy is present. Periventricular and subcortical T2 hyperintensities are present bilaterally. White matter changes extend into the brainstem. Chronic encephalomalacia is noted in the right cerebellum with prior craniotomy. Calcifications are again noted at the undersurface of the left cerebellum. The ventricles are proportionate to the degree of atrophy. No significant extraaxial fluid collection is present. Internal auditory canals are unremarkable. Vascular: Flow is present in the major intracranial arteries. Skull and upper cervical spine: Marrow spaces demonstrate extensive T1 shortening. Question prior radiation. Craniocervical junction is otherwise normal. Sinuses/Orbits: Mild mucosal thickening is present throughout the anterior paranasal sinuses. Fluid is present in the mastoid air cells bilaterally, left greater than right. The globes and orbits are within normal limits. MRA HEAD FINDINGS Internal carotid minimal atherosclerotic irregularity is present within arteries bilaterally the cavernous. 1.5 mm posterior communicating artery aneurysms are noted bilaterally. ICA termini are otherwise normal. The A1 and M1 segments are within normal limits. The anterior communicating artery is patent. ACA and MCA branch vessels are within normal limits. The right vertebral artery is dominant. Left V4 segment is hypoplastic. PICA origin is noted on the left. Basilar artery is within limits. High-grade proximal left P2 segment stenosis is present. Distal left PCA branch vessels are not visualized. Mild distal atherosclerotic changes are present in the right PCA branches which are otherwise intact. IMPRESSION: 1. Stable 2.4 cm acute hemorrhage in the  left thalamus with edema extending into the left cerebral peduncle and posterior limb left internal capsule. 2. Moderate generalized atrophy and white matter disease likely reflects the sequela of chronic microvascular ischemia. 3. 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 4. High-grade proximal left P2 segment stenosis. This could correlate with the thalamic hemorrhage. No infarct present in the more distal left PCA territory. 5. No other significant proximal stenosis, aneurysm, or branch vessel occlusion within the Circle of Willis. No vascular malformation. Electronically Signed   By: Marin Robertshristopher  Mattern M.D.   On: 12/10/2019 18:30   MR BRAIN WO CONTRAST  Result Date: 12/10/2019 CLINICAL DATA:  Code stroke. Neurologic deficit. Cerebral hemorrhage. Right hemiparesis. Left gaze preference. EXAM: MRI HEAD WITHOUT CONTRAST MRA HEAD WITHOUT CONTRAST TECHNIQUE: Multiplanar, multiecho pulse sequences of the brain and surrounding structures were obtained without intravenous contrast. Angiographic images of the head were obtained using MRA technique without contrast. COMPARISON:  None. CT head without contrast 12/10/2019 FINDINGS: MRI HEAD FINDINGS Brain: Acute hemorrhage in the left thalamus measuring 2.4 cm maximally is stable. Surrounding edema extends into the left cerebral peduncle and superiorly into the posterior limb left internal capsule. There is some focal mass effect. No additional hemorrhage is evident. Mass lesion is evident. Moderate generalized atrophy is present. Periventricular and subcortical T2 hyperintensities are present bilaterally. White matter changes extend into the brainstem. Chronic encephalomalacia is noted in the right cerebellum with prior craniotomy. Calcifications are again noted at the undersurface of the left cerebellum. The ventricles are proportionate to  the degree of atrophy. No significant extraaxial fluid collection is present. Internal auditory canals are  unremarkable. Vascular: Flow is present in the major intracranial arteries. Skull and upper cervical spine: Marrow spaces demonstrate extensive T1 shortening. Question prior radiation. Craniocervical junction is otherwise normal. Sinuses/Orbits: Mild mucosal thickening is present throughout the anterior paranasal sinuses. Fluid is present in the mastoid air cells bilaterally, left greater than right. The globes and orbits are within normal limits. MRA HEAD FINDINGS Internal carotid minimal atherosclerotic irregularity is present within arteries bilaterally the cavernous. 1.5 mm posterior communicating artery aneurysms are noted bilaterally. ICA termini are otherwise normal. The A1 and M1 segments are within normal limits. The anterior communicating artery is patent. ACA and MCA branch vessels are within normal limits. The right vertebral artery is dominant. Left V4 segment is hypoplastic. PICA origin is noted on the left. Basilar artery is within limits. High-grade proximal left P2 segment stenosis is present. Distal left PCA branch vessels are not visualized. Mild distal atherosclerotic changes are present in the right PCA branches which are otherwise intact. IMPRESSION: 1. Stable 2.4 cm acute hemorrhage in the left thalamus with edema extending into the left cerebral peduncle and posterior limb left internal capsule. 2. Moderate generalized atrophy and white matter disease likely reflects the sequela of chronic microvascular ischemia. 3. 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 4. High-grade proximal left P2 segment stenosis. This could correlate with the thalamic hemorrhage. No infarct present in the more distal left PCA territory. 5. No other significant proximal stenosis, aneurysm, or branch vessel occlusion within the Circle of Willis. No vascular malformation. Electronically Signed   By: Marin Roberts M.D.   On: 12/10/2019 18:30   CT HEAD CODE STROKE WO CONTRAST  Result Date:  12/10/2019 CLINICAL DATA:  Code stroke.  Neurological deficit, not specified. EXAM: CT HEAD WITHOUT CONTRAST TECHNIQUE: Contiguous axial images were obtained from the base of the skull through the vertex without intravenous contrast. COMPARISON:  None. FINDINGS: Brain: Distant right occipital craniectomy. Encephalomalacia the right cerebellar hemisphere. Chronic calcification of the left cerebellar nuclei. Acute intraparenchymal hemorrhage in the left thalamus measuring approximately 2.5 x 1.3 x 2.1 Cm (volume = 3.6 cm^3) mild surrounding edema. Elsewhere, cerebral hemispheres show generalized atrophy, markedly advanced for age. Chronic small-vessel ischemic changes of the deep white matter. Old bilateral occipital insults with dystrophic calcification. 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 No hydrocephalus or extra-axial collection. Vascular: There is atherosclerotic calcification of the major vessels at the base of the brain. Skull: Right occipital craniectomy as previously noted. Sinuses/Orbits: Clear/normal Other: None ASPECTS (Alberta Stroke Program Early CT Score) - Ganglionic level infarction (caudate, lentiform nuclei, internal capsule, insula, M1-M3 cortex): 7 - Supraganglionic infarction (M4-M6 cortex): 3 Total score (0-10 with 10 being normal): 10 IMPRESSION: 1. Acute intraparenchymal hemorrhage in the left thalamus measuring approximately 2.5 x 1.3 x 2.1 cm (volume 3.6 cm^3). Mild surrounding edema. 2. Old bilateral occipital insults with dystrophic calcification. 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 Chronic small-vessel changes of the hemispheric white matter. 3. Distant right occipital craniectomy. Encephalomalacia the right cerebellar hemisphere. 4. ASPECTS is 10. 5. These results were communicated to Dr. Derry Lory At 3:12 pmon 11/25/2021by text page via the [facility] messaging system. Electronically Signed   By: Paulina Fusi M.D.   On: 12/10/2019 15:14    PHYSICAL  EXAM Pleasant middle-age Caucasian lady not in distress. . Afebrile. Head is nontraumatic. Neck is supple without bruit.    Cardiac exam no murmur  or gallop. Lungs are clear to auscultation. Distal pulses are well felt. Neurological Exam :  Awake alert oriented to time place and person.  Mild dysarthria.  No aphasia.  Follows commands well.  Extraocular movements full range without nystagmus.  Blinks to threat bilaterally.  Right lower facial weakness.  Tongue midline.  Motor system exam shows right hemiplegia with 2/5 right upper extremity and 3/5 right lower extremity strength.  Sensation is diminished on the right compared to the left.  Coordination impaired on the right proportionate to the degree of weakness.  Right plantar upgoing left downgoing.  Gait not tested. ASSESSMENT/PLAN Melissa Colon is a 59 y.o. 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 CT showed L thalamic hemorrhage.   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 Old B occipital insults w/ calcification. 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 Small vessel disease. 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   MRI  Stable L thalamic hemorrhage w/ edema into L cerebral peduncle and PLIC. Moderate Small vessel disease. Atrophy.   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 High-grade L P2 stenosis.    CT head repeat stable  Carotid Doppler  pending   2D Echo pending   LDL pending  HgbA1c pending  VTE prophylaxis - SCDs   No antithrombotic prior to admission  Therapy recommendations:  pending   Disposition:  pending   Monitor in ICU x 24h  Hypertension  Home meds:  None, no hx HTN  BP as high as 174/78 on admission   SBP goal < 140  Treated w/ Cleviprex, wean as able  Added Norvasc 5  prn  hydralazine, labetalol   BP Stable . Long-term BP goal normotensive  Other Stroke Risk Factors  Cigarette smoker, advised to stop smoking. On nicotine patch  ETOH abuse, alcohol level <10, advised to drink no more than 1 drink(s) a day. On CIWA protocol.  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  Start Norvasc 5 mg daily and add IV labetalol 20 mg and hydralazine 10 mg as needed to meet goal.  Physical occupational therapy.  Mobilize out of bed.  Check echocardiogram, lipid profile, hemoglobin A1c and carotid Dopplers.  Patient counseled to quit smoking.  Use nicotine patch.  Long discussion patient and RN at the bedside and answered questions. This patient is critically ill and at significant risk of neurological worsening, death and care requires constant monitoring of vital signs, hemodynamics,respiratory and cardiac monitoring, extensive review of multiple databases, frequent neurological assessment, discussion with family, other specialists and medical decision making of high complexity.I have made any additions or clarifications directly to the above note.This critical care time does not reflect procedure time, or teaching time or supervisory time of PA/NP/Med Resident etc but could involve care discussion time.  I spent 30 minutes of neurocritical care time  in the care of  this patient.    Delia Heady, MD  To contact Stroke Continuity provider, please refer to [email or web address]. After hours, contact General Neurology

## 2019-12-11 NOTE — Progress Notes (Signed)
Carotid artery duplex completed. Refer to "CV Proc" under chart review to view preliminary results.  12/11/2019 3:22 PM Eula Fried., MHA, RVT, RDCS, RDMS

## 2019-12-11 NOTE — Progress Notes (Signed)
  Echocardiogram 2D Echocardiogram has been performed.  Pieter Partridge 12/11/2019, 9:48 AM

## 2019-12-11 NOTE — Progress Notes (Signed)
Rehab Admissions Coordinator Note:  Patient was screened by Clois Dupes for appropriateness for an Inpatient Acute Rehab Consult per therapy recommendation. .  At this time, we are recommending Inpatient Rehab consult. I will place order per protocol.  Clois Dupes RN MSN 12/11/2019, 12:27 PM  I can be reached at [phone].

## 2019-12-11 NOTE — Evaluation (Signed)
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   Clinical Impression   Pt admitted with above. She demonstrates the below listed deficits and will benefit from continued OT to maximize safety and independence with BADLs.  Pt presents to OT with Rt hemiparesis with ataxia, and impaired sensation, impaired balance, visual deficits, impaired cognition, impaired balance.  She currently requires set up assist to max A for ADLs and mod A +2 for functional mobility.  She lives with her spouse, son, brother in law, and sister in Social worker.  SHe reports she was fully independent with ADLs and functional mobility PTA.  She does not drive, and does not work.  Recommend CIR level rehab at discharge.  Will follow.       Follow Up Recommendations  CIR;Supervision/Assistance - 24 hour    Equipment Recommendations  3 in 1 bedside commode;Tub/shower seat    Recommendations for Other Services Rehab consult     Precautions / Restrictions Precautions Precautions: Fall Precaution Comments: R hemiparesis and incoordination Restrictions Weight Bearing Restrictions: No      Mobility Bed Mobility Overal bed mobility: Needs Assistance Bed Mobility: Supine to Sit     Supine to sit: Min assist;HOB elevated;+2 for safety/equipment     General bed mobility comments: Min assist for completing translation of RLE to EOB, placing RUE next to trunk.    Transfers Overall transfer level: Needs assistance Equipment used: 2 person hand held assist Transfers: Sit to/from Stand Sit to Stand: Mod assist;+2 physical assistance         General transfer comment: Mod +2 for power up, steadying, and RLE blocking. R lateral leaning  corrected with VCs.    Balance Overall balance assessment: Needs assistance Sitting-balance support: No upper extremity supported;Feet supported Sitting balance-Leahy Scale: Fair Sitting balance - Comments: posterior leaning with donning socks, requiring min-mod assist to correct Postural control: Posterior lean Standing balance support: Bilateral upper extremity supported;During functional activity Standing balance-Leahy Scale: Poor Standing balance comment: reliant on external assist, R lateral leaning                           ADL either performed or assessed with clinical judgement   ADL Overall ADL's : Needs assistance/impaired Eating/Feeding: Set up Eating/Feeding Details (indicate cue type and reason): frequent spills  Grooming: Wash/dry hands;Wash/dry face;Oral care;Brushing hair;Minimal assistance;Sitting   Upper Body Bathing: Moderate assistance;Sitting   Lower Body Bathing: Moderate assistance;Sit to/from stand Lower Body Bathing Details (indicate cue type and reason): for balance in sitting and standing  Upper Body Dressing : Moderate assistance;Sitting   Lower Body Dressing: Moderate assistance;Sit to/from stand Lower Body Dressing Details (indicate cue type and reason): able to don socks with  up to mod A to sitting balance  Toilet Transfer: Moderate assistance;+2 for physical assistance;+2 for safety/equipment;BSC   Toileting- Clothing Manipulation and Hygiene: Maximal assistance;Sit to/from stand       Functional mobility during ADLs: Moderate assistance;+2 for safety/equipment;Total assistance       Vision Baseline Vision/History: Wears glasses Wears Glasses: At all times Patient Visual Report: Blurring of vision Vision Assessment?: Yes Eye Alignment: Impaired (comment) Ocular Range of Motion: Restricted looking up Tracking/Visual Pursuits: Decreased smoothness of horizontal tracking  Visual Fields: No apparent deficits Diplopia Assessment:  Disappears with one eye closed Depth Perception: Overshoots Additional Comments: Pt demonstrates nystagmus with all gaze Rt eye.  Decreased vertical eye movements Rt eye.  Pt endorses horizontal diplopia with all directions of gaze      Perception Perception Perception Tested?: Yes   Praxis Praxis Praxis tested?: Within functional limits    Pertinent Vitals/Pain Pain Assessment: No/denies pain     Hand Dominance Right   Extremity/Trunk Assessment Upper Extremity Assessment Upper Extremity Assessment: RUE deficits/detail RUE Deficits / Details: full AROM, however, she demonstrates significant deficits with proprioception.  She is unable to modulate the amount of force she needs to perform a task.  overshoots and undershoots - ataxia noted  RUE Sensation: decreased light touch;decreased proprioception RUE Coordination: decreased fine motor;decreased gross motor   Lower Extremity Assessment Lower Extremity Assessment: Defer to PT evaluation RLE Deficits / Details: at least 3/5 strength knee flex/ext, hip flex/ex, DF/PF. Pt limited by RLE incoordination: + heel-to-shin incoordination, poor proprioceptive control in swing and stance phase of gait RLE Sensation: decreased proprioception;decreased light touch RLE Coordination: decreased gross motor;decreased fine motor   Cervical / Trunk Assessment Cervical / Trunk Assessment: Normal   Communication Communication Communication: Expressive difficulties   Cognition Arousal/Alertness: Awake/alert Behavior During Therapy: WFL for tasks assessed/performed;Anxious Overall Cognitive Status: Impaired/Different from baseline Area of Impairment: Orientation;Following commands;Safety/judgement;Problem solving                 Orientation Level: Disoriented to;Place     Following Commands: Follows one step commands with increased time Safety/Judgement: Decreased awareness of safety   Problem Solving: Slow processing;Decreased  initiation;Requires verbal cues;Requires tactile cues;Difficulty sequencing General Comments: Pt oriented to being in a hospital, not [facility] or [facility]. Pt requires increased time and effort to verbally and physically respond to questions and mobility commands, respectively. Demonstrates decreased responsiveness and command following when up, suspect due to anxiety.   General Comments  vss    Exercises Exercises: Other exercises Other Exercises Other Exercises: Nasal occlusion performed to Rt lens of glasses to reduce diplopia while allowing for binocular eye movements. Pt reports decreased diplopia with glasses in place    Shoulder Instructions      Home Living Family/patient expects to be discharged to:: Private residence Living Arrangements: Spouse/significant other Available Help at Discharge: Family;Available 24 hours/day Type of Home: Mobile home Home Access: Stairs to enter Entrance Stairs-Number of Steps: 5 Entrance Stairs-Rails: Right;Left Home Layout: One level     Bathroom Shower/Tub: Walk-in shower;Tub only   Bathroom Toilet: Standard     Home Equipment: [employer] - single point   Additional Comments: Lives with her spouse, son, brother in Social worker and sister in Social worker.  Pt reports that none of them work during the day, and can be available to assist her as needed       Prior Functioning/Environment Level of Independence: Independent        Comments: Pt reports she was fully independent.  Does not drive, does not work         OT Problem List: Decreased activity tolerance;Impaired balance (sitting and/or standing);Impaired vision/perception;Decreased coordination;Decreased cognition;Decreased safety awareness;Decreased knowledge of use of DME or AE;Impaired sensation;Impaired UE functional use      OT Treatment/Interventions: Self-care/ADL training;Therapeutic exercise;Neuromuscular education;DME and/or AE instruction;Therapeutic activities;Cognitive  remediation/compensation;Visual/perceptual remediation/compensation;Patient/family education;Balance training    OT Goals(Current goals can be found in the care plan section) Acute Rehab OT Goals Patient Stated Goal: get better OT Goal Formulation: With  patient Time For Goal Achievement: 12/25/19 Potential to Achieve Goals: Good ADL Goals Pt Will Perform Eating: with set-up;sitting;with adaptive utensils Pt Will Perform Grooming: with min assist;standing Pt Will Perform Upper Body Bathing: with set-up;with supervision;sitting Pt Will Perform Lower Body Bathing: with min assist;sit to/from stand Pt Will Perform Upper Body Dressing: with min assist;sitting Pt Will Perform Lower Body Dressing: with min assist;sit to/from stand Pt Will Transfer to Toilet: with min assist;ambulating;regular height toilet;grab bars Pt Will Perform Toileting - Clothing Manipulation and hygiene: with min assist;sit to/from stand Additional ADL Goal #1: Pt will be independent with exercises for vision  OT Frequency: Min 2X/week   Barriers to D/C:            Co-evaluation PT/OT/SLP Co-Evaluation/Treatment: Yes Reason for Co-Treatment: For patient/therapist safety;To address functional/ADL transfers PT goals addressed during session: Mobility/safety with mobility;Balance OT goals addressed during session: ADL's and self-care      AM-PAC OT "6 Clicks" Daily Activity     Outcome Measure Help from another person eating meals?: A Little Help from another person taking care of personal grooming?: A Little Help from another person toileting, which includes using toliet, bedpan, or urinal?: A Lot Help from another person bathing (including washing, rinsing, drying)?: A Lot Help from another person to put on and taking off regular upper body clothing?: A Lot Help from another person to put on and taking off regular lower body clothing?: A Lot 6 Click Score: 14   End of Session Equipment Utilized During  Treatment: Gait belt Nurse Communication: Mobility status  Activity Tolerance: Patient tolerated treatment well Patient left: in chair;with call bell/phone within reach;with chair alarm set  OT Visit Diagnosis: Unsteadiness on feet (R26.81);Ataxia, unspecified (R27.0)                Time: 1014-1040 OT Time Calculation (min): 26 min Charges:  OT General Charges $OT Visit: 1 Visit OT Evaluation $OT Eval Moderate Complexity: 1 Mod  Eber Jones., OTR/L Acute Rehabilitation Services Pager [phone] Office [phone]   Jeani Hawking M 12/11/2019, 11:38 AM

## 2019-12-11 NOTE — Progress Notes (Signed)
Pt belongings upon arriving to the floor are:  Clothes (shirt, pants, sweater, socks) Shoes Earrings (placed in denture cup) Glasses

## 2019-12-12 LAB — LIPID PANEL
Cholesterol: 179 mg/dL (ref 0–200)
HDL: 69 mg/dL (ref >40)
LDL Cholesterol: 92 mg/dL (ref 0–99)
Total CHOL/HDL Ratio: 2.6 RATIO
Triglycerides: 90 mg/dL (ref <150)
VLDL: 18 mg/dL (ref 0–40)

## 2019-12-12 LAB — BASIC METABOLIC PANEL
Anion gap: 10 (ref 5–15)
BUN: 9 mg/dL (ref 6–20)
CO2: 21 mmol/L — ABNORMAL LOW (ref 22–32)
Calcium: 8.6 mg/dL — ABNORMAL LOW (ref 8.9–10.3)
Chloride: 103 mmol/L (ref 98–111)
Creatinine, Ser: 0.6 mg/dL (ref 0.44–1.00)
GFR, Estimated: >60 mL/min (ref >60)
Glucose, Bld: 123 mg/dL — ABNORMAL HIGH (ref 70–99)
Potassium: 4 mmol/L (ref 3.5–5.1)
Sodium: 134 mmol/L — ABNORMAL LOW (ref 135–145)

## 2019-12-12 LAB — CBC
HCT: 33.9 % — ABNORMAL LOW (ref 36.0–46.0)
Hemoglobin: 11.3 g/dL — ABNORMAL LOW (ref 12.0–15.0)
MCH: 31.5 pg (ref 26.0–34.0)
MCHC: 33.3 g/dL (ref 30.0–36.0)
MCV: 94.4 fL (ref 80.0–100.0)
Platelets: 228 10*3/uL (ref 150–400)
RBC: 3.59 MIL/uL — ABNORMAL LOW (ref 3.87–5.11)
RDW: 14.7 % (ref 11.5–15.5)
WBC: 5.5 10*3/uL (ref 4.0–10.5)
nRBC: 0 % (ref 0.0–0.2)

## 2019-12-12 LAB — HEMOGLOBIN A1C
Hgb A1c MFr Bld: 5.3 % (ref 4.8–5.6)
Mean Plasma Glucose: 105.41 mg/dL

## 2019-12-12 MED ORDER — HEPARIN SODIUM (PORCINE) 5000 UNIT/ML IJ SOLN
5000.0000 [IU] | Freq: Two times a day (BID) | INTRAMUSCULAR | Status: DC
  Administered 2019-12-12 – 2019-12-14 (×4): 5000 [IU] via SUBCUTANEOUS
  Filled 2019-12-12 (×4): qty 1

## 2019-12-12 NOTE — Evaluation (Signed)
Speech Language Pathology Evaluation Patient Details Name: Melissa Colon MRN: [id number] DOB: 10-01-1960 Today's Date: 12/12/2019 Time:  -     Problem List:  Patient Active Problem List   Diagnosis Date Noted  . Hypertensive emergency 12/11/2019  . Alcohol dependence (HCC) 12/11/2019  . Tobacco use disorder 12/11/2019  . 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 Alcohol dependence (HCC)   . H/O brain tumor   . Smoker    Past Surgical History:  Past Surgical History:  Procedure Laterality Date  . CRANIOTOMY FOR TUMOR     HPI:  [name] is an 59 y.o. 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 Surrounding edema extends into the left cerebral peduncle and superiorly into the posterior limb left internal capsule. There is some focal mass effect. No additional hemorrhage is evident. Mass lesion is evident  Assessment / Plan / Recommendation Clinical Impression  Pt administered the SLUMS ([employer] [place] [employer] Mental Status Examination) with a score of 17/26 yielded eliminating the written portion of the examination d/t dominant hand affected by CVA.  Pt exhibited deficits in the following areas: attention, memory, organization, and expression/comprehension.  Pt had difficulty repeating numbers backwards greater than 3 numbers, naming various animals within time constraint, and following multi-step directives without repetition and/or extended time.  She exhibited expressive aphasia during initiation tasks and/or requiring a certain term during sentences-conversation.  Noted phonemic and semantic paraphasias within speech with transposed words such as "[facility]" for hospital name  being "[facility]" and awareness fluctuated within tasks.  Pt was able to complete auditory comprehension tasks such as answering questions from a paragraph, but  processing of information was delayed overall.  Slight dysarthria present during communicative interactions, so intelligibility judged to be 75% accurate overall for functional communication within simple paragraphs.  ST recommended to follow up for deficits listed above while in acute setting.  Thank you for this consult.    SLP Assessment  SLP Recommendation/Assessment: Patient needs continued Speech Lanaguage Pathology Services SLP Visit Diagnosis: Aphasia (R47.01);Dysarthria and anarthria (R47.1);Cognitive communication deficit (R41.841)    Follow Up Recommendations  Inpatient Rehab;Other (comment) (if patient in agreement )    Frequency and Duration min 2x/week  1 week      SLP Evaluation Cognition  Overall Cognitive Status: No family/caregiver present to determine baseline cognitive functioning Arousal/Alertness: Awake/alert Attention: Sustained Sustained Attention: Impaired Sustained Attention Impairment: Verbal basic;Functional basic Memory: Impaired Memory Impairment: Retrieval deficit;Decreased recall of new information;Decreased short term memory Decreased Short Term Memory: Verbal basic;Functional basic Immediate Memory Recall: Sock;Blue;Bed Memory Recall Sock: With Cue Memory Recall Blue: With Cue Memory Recall Bed: With Cue Awareness: Appears intact Behaviors: Perseveration;Confabulation       Comprehension  Auditory Comprehension Overall Auditory Comprehension: Impaired Yes/No Questions: Within Functional Limits Commands: Impaired Conversation: Simple Other Conversation Comments: articulatory errors, phonemic/semantic paraphasias Interfering Components: Processing speed;Working Radio broadcast assistant: Dietitian: Exceptions to  [employer] Reading Comprehension Reading Status: Not tested    Expression Expression Primary Mode of Expression: Verbal Verbal Expression Overall Verbal Expression: Impaired Initiation: Impaired Level of Generative/Spontaneous Verbalization: Phrase;Sentence Repetition: Impaired Level of Impairment: Phrase level Naming: Impairment Responsive: 76-100% accurate Confrontation: Within functional  limits Convergent: 50-74% accurate Divergent: 50-74% accurate Verbal Errors: Semantic paraphasias;Phonemic paraphasias;Perseveration Non-Verbal Means of Communication: Not applicable Written Expression Dominant Hand: Right Written Expression: Unable to assess (comment) (dominant hand affected)   Oral / Motor  Oral Motor/Sensory Function Overall Oral Motor/Sensory Function: Mild impairment Facial ROM: Reduced right Facial Symmetry: Abnormal symmetry right Facial Strength: Within Functional Limits Facial Sensation: Reduced right Lingual ROM: Reduced right Lingual Symmetry: Abnormal symmetry right Lingual Strength: Reduced Lingual Sensation: Reduced Motor Speech Overall Motor Speech: Appears within functional limits for tasks assessed Respiration: Within functional limits Phonation: Normal Resonance: Within functional limits Articulation: Impaired Level of Impairment: Sentence Intelligibility: Intelligibility reduced Word: 75-100% accurate Phrase: 50-74% accurate Sentence: 50-74% accurate Conversation: 50-74% accurate Motor Planning: Witnin functional limits Motor Speech Errors: Not applicable                      Tressie Stalker, M.S., CCC-SLP 12/12/2019, 5:02 PM

## 2019-12-12 NOTE — Progress Notes (Signed)
Inpatient Rehab Admissions Coordinator:   Met with patient at bedside to discuss potential CIR admission. Pt. Stated interest, but wants time to think about the financial aspect. I will have financial counselor reach out to her and follow up with pt. Tomorrow.   Clemens Catholic, [place], [place] Admissions Coordinator  [phone] ([place]) [phone] (office)

## 2019-12-12 NOTE — PMR Pre-admission (Signed)
PMR Admission Coordinator Pre-Admission Assessment  Patient: Melissa Colon is an 59 y.o., female MRN: [id number] DOB: 12-25-1960 Height:   Weight: 49.9 kg              Insurance Information HMO:     PPO:      PCP:      IPA:      80/20:      OTHER:  PRIMARY: Uninsured *pt and her husband have been informed of daily cost of care ($3,500). They would like to proceed.   Financial Counselor: Cathlean Marseilles      Phone#: [phone]  *Per [facility] note, pt has been referred to [facility]  The "Data Collection Information Summary" for patients in Inpatient Rehabilitation Facilities with attached "Privacy Act Statement-Health Care Records" was provided and verbally reviewed with: N/A  Emergency Contact Information Contact Information    Name Relation Home Work Mobile   Melissa Colon Brother   [phone]   Madaline Guthrie Niece   [phone]     Current Medical History  Patient Admitting Diagnosis: ICH History of Present Illness: Melissa Colon is an 59 y.o. 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 Pt. Presented to ED with right sided weakness, dysarthria and left gaze preference. Code stroke was called. 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 This could correlate with the thalamic hemorrhage. No infarct present in the more distal left PCA territory.  Carotid Doppler was unremarkable.  2D Echo was performed and revealed  EF 60 - 65%.No cardiac source of emboli identified. Pt. Presents with functional deficits in mobility and ADLs, dysarthria,  and mild cognitive impairment and therapies are recommending CIR following discharge. Pt is to admit to CIR on 12/14/19.  Complete NIHSS TOTAL: 13 Glasgow Coma Scale Score: 15  Past Medical History  Past Medical History:  Diagnosis Date  . Alcohol dependence (HCC)   . H/O brain tumor   . Smoker     Family History  family history includes Diabetes in her mother.  Prior Rehab/Hospitalizations:  Has the patient had prior rehab or hospitalizations prior to admission? No  Has the patient had major surgery during 100 days prior to admission? No  Current Medications   Current Facility-Administered Medications:  .   stroke: mapping our early stages of recovery book, , Does not apply, Once, Erick Blinks, MD .  acetaminophen (TYLENOL) tablet 650 mg, 650 mg, Oral, Q4H PRN, 650 mg at 12/13/19 0625 **OR** acetaminophen (TYLENOL) 160 MG/5ML solution 650 mg, 650 mg, Per Tube, Q4H PRN **OR** acetaminophen (TYLENOL) suppository 650 mg, 650 mg, Rectal, Q4H PRN, Erick Blinks, MD .  amLODipine (NORVASC) tablet 5 mg, 5 mg, Oral, Daily, Micki Riley, MD, 5 mg at 12/14/19 0921 .  dextromethorphan-guaiFENesin (MUCINEX DM) 30-600 MG per 12 hr tablet 1 tablet, 1 tablet, Oral, BID PRN, Amin, Ankit Chirag, MD .  folic acid (FOLVITE) tablet 1 mg, 1 mg, Oral, Daily, Kirby-Graham, Beather Arbour, NP, 1 mg at 12/14/19 0920 .  heparin injection 5,000 Units, 5,000 Units, Subcutaneous, Q12H, Marvel Plan, MD, 5,000 Units at 12/14/19 0920 .  hydrALAZINE (APRESOLINE) injection 10 mg, 10 mg,  Intravenous, Q8H PRN, Micki RileySethi, Pramod S, MD .  labetalol (NORMODYNE) injection 20 mg, 20 mg, Intravenous, Q2H PRN, Micki RileySethi, Pramod S, MD, 20 mg at 12/11/19 0805 .  LORazepam (ATIVAN) injection 1-2 mg, 1-2 mg, Intravenous, Q1H PRN, Kirby-Graham, Beather ArbourKaren J, NP .  multivitamin with minerals tablet 1 tablet, 1 tablet, Oral, Daily, Kirby-Graham, Beather ArbourKaren J, NP, 1 tablet at 12/14/19 0921 .  nicotine (NICODERM CQ - dosed in mg/24 hours)  patch 14 mg, 14 mg, Transdermal, Daily, Kirby-Graham, Beather ArbourKaren J, NP, 14 mg at 12/13/19 2217 .  pantoprazole (PROTONIX) EC tablet 40 mg, 40 mg, Oral, QHS, Micki RileySethi, Pramod S, MD, 40 mg at 12/13/19 2216 .  senna-docusate (Senokot-S) tablet 1 tablet, 1 tablet, Oral, BID, Erick BlinksKhaliqdina, Salman, MD, 1 tablet at 12/14/19 0920 .  senna-docusate (Senokot-S) tablet 1 tablet, 1 tablet, Oral, QHS PRN, Amin, Ankit Chirag, MD .  thiamine tablet 100 mg, 100 mg, Oral, Daily, Kirby-Graham, Beather ArbourKaren J, NP, 100 mg at 12/14/19 0920  Patients Current Diet:  Diet Order            Diet regular Room service appropriate? Yes with Assist; Fluid consistency: Thin  Diet effective now                 Precautions / Restrictions Precautions Precautions: Fall Precaution Comments: R hemiparesis and incoordination Restrictions Weight Bearing Restrictions: No   Has the patient had 2 or more falls or a fall with injury in the past year?No  Prior Activity Level Limited Community (1-2x/wk): Went out for appointments and errands  Prior Functional Level Prior Function Level of Independence: Independent Comments: Pt reports she was fully independent.  Does not drive, does not work   Self Care: Did the patient need help bathing, dressing, using the toilet or eating?  Independent  Indoor Mobility: Did the patient need assistance with walking from room to room (with or without device)? Independent  Stairs: Did the patient need assistance with internal or external stairs (with or without device)? Independent  Functional Cognition: Did the patient need help planning regular tasks such as shopping or remembering to take medications? Independent  Home Assistive Devices / Equipment Home Assistive Devices/Equipment: None Home Equipment: Cane - single point  Prior Device Use: Indicate devices/aids used by the patient prior to current illness, exacerbation or injury? None of the above  Current Functional Level Cognition   Arousal/Alertness: Awake/alert Overall Cognitive Status: No family/caregiver present to determine baseline cognitive functioning Orientation Level: Oriented X4 Following Commands: Follows one step commands with increased time Safety/Judgement: Decreased awareness of safety General Comments: Pt oriented to [facility] a [facility], [facility]. Pt requires increased time and effort to verbally and physically respond to questions and mobility commands, respectively. Demonstrates decreased responsiveness and command following when up, suspect due to anxiety. Attention: Sustained Sustained Attention: Impaired Sustained Attention Impairment: Verbal basic, Functional basic Memory: Impaired Memory Impairment: Retrieval deficit, Decreased recall of new information, Decreased short term memory Decreased Short Term Memory: Verbal basic, Functional basic Awareness: Appears intact Behaviors: Perseveration, Confabulation    Extremity Assessment (includes Sensation/Coordination)  Upper Extremity Assessment: RUE deficits/detail RUE Deficits / Details: full AROM, however, she demonstrates significant deficits with proprioception.  She is unable to modulate the amount of force she needs to perform a task.  overshoots and undershoots - ataxia noted  RUE Sensation: decreased light touch, decreased proprioception RUE Coordination: decreased fine motor, decreased gross motor  Lower Extremity Assessment: Defer to PT evaluation RLE Deficits / Details: at least  3/5 strength knee flex/ext, hip flex/ex, DF/PF. Pt limited by RLE incoordination: + heel-to-shin incoordination, poor proprioceptive control in swing and stance phase of gait RLE Sensation: decreased proprioception, decreased light touch RLE Coordination: decreased gross motor, decreased fine motor    ADLs  Overall ADL's : Needs assistance/impaired Eating/Feeding: Set up Eating/Feeding Details (indicate cue type and reason): frequent spills   Grooming: Wash/dry hands, Wash/dry face, Oral care, Brushing hair, Minimal assistance, Sitting Upper Body Bathing: Moderate assistance, Sitting Lower Body Bathing: Moderate assistance, Sit to/from stand Lower Body Bathing Details (indicate cue type and reason): for balance in sitting and standing  Upper Body Dressing : Moderate assistance, Sitting Lower Body Dressing: Moderate assistance, Sit to/from stand Lower Body Dressing Details (indicate cue type and reason): able to don socks with  up to mod A to sitting balance  Toilet Transfer: Moderate assistance, +2 for physical assistance, +2 for safety/equipment, BSC Toileting- Clothing Manipulation and Hygiene: Maximal assistance, Sit to/from stand Functional mobility during ADLs: Moderate assistance, +2 for safety/equipment, Total assistance    Mobility  Overal bed mobility: Needs Assistance Bed Mobility: Supine to Sit Supine to sit: Min assist, HOB elevated, +2 for safety/equipment General bed mobility comments: Min assist for completing translation of RLE to EOB, placing RUE next to trunk.    Transfers  Overall transfer level: Needs assistance Equipment used: 2 person hand held assist Transfers: Sit to/from Stand Sit to Stand: Mod assist, +2 physical assistance General transfer comment: Mod +2 for power up, steadying, and RLE blocking. R lateral leaning corrected with VCs.    Ambulation / Gait / Stairs / Wheelchair Mobility  Ambulation/Gait Ambulation/Gait assistance: Mod assist, +2 physical assistance Gait Distance (Feet): 5 Feet Assistive device: 2 person hand held assist Gait Pattern/deviations: Step-to pattern, Decreased step length - right, Decreased weight shift to right, Narrow base of support General Gait Details: Mod +2 for steadying, weight shifting L and R for forward progression LEs, RLE blocking during RLE stance phase, physically assisting RLE during swing phase especially with fatigue. VC for upright posture as pt with  preference for R lateral leaning, placing R foot flat during R stance as pt holds foot in supination. Very incoordinated R swing phase. Gait velocity: decr    Posture / Balance Dynamic Sitting Balance Sitting balance - Comments: posterior leaning with donning socks, requiring min-mod assist to correct Balance Overall balance assessment: Needs assistance Sitting-balance support: No upper extremity supported, Feet supported Sitting balance-Leahy Scale: Fair Sitting balance - Comments: posterior leaning with donning socks, requiring min-mod assist to correct Postural control: Posterior lean Standing balance support: Bilateral upper extremity supported, During functional activity Standing balance-Leahy Scale: Poor Standing balance comment: reliant on external assist, R lateral leaning    Special needs/care consideration Pt. Is on CIWA protocol but has demonstrated no signs of withdrawal and has not required ativan since admission.      Previous Home Environment (from acute therapy documentation) Living Arrangements: Spouse/significant other  Lives With: Spouse, Family Available Help at Discharge: Family, Available 24 hours/day Type of Home: House Home Layout: One level Home Access: Stairs to enter Entrance Stairs-Rails: Right, Left Entrance Stairs-Number of Steps: 5 Bathroom Shower/Tub: Walk-in shower, Tub only Firefighter: Standard Bathroom Accessibility: Yes How Accessible: Accessible via walker Home Care Services: No Additional Comments: Lives with her spouse, son, brother in law and sister in Social worker.  Pt reports that none of them work during the day, and can be available to assist her as needed   Discharge Living  Setting Plans for Discharge Living Setting: Patient's home Type of Home at Discharge: Mobile home Discharge Home Layout: One level Discharge Home Access: Stairs to enter Entrance Stairs-Rails: Can reach both Entrance Stairs-Number of Steps: 5 (5) Discharge Bathroom  Shower/Tub: Walk-in shower Discharge Bathroom Toilet: Standard Discharge Bathroom Accessibility: Yes How Accessible: Accessible via walker Does the patient have any problems obtaining your medications?: No  Social/Family/Support Systems Patient Roles: Other (Comment) Contact Information: [phone]  Anticipated Caregiver: Melissa Colon (brother in law) Anticipated Caregiver's Contact Information: [phone] Ability/Limitations of Caregiver: none Caregiver Availability: 24/7 Discharge Plan Discussed with Primary Caregiver: Yes Is Caregiver In Agreement with Plan?: Yes Does Caregiver/Family have Issues with Lodging/Transportation while Pt is in Rehab?: No   Goals Patient/Family Goal for Rehab: PT/OT/SLP Min A Expected length of stay: 12-14 days Pt/Family Agrees to Admission and willing to participate: Yes Program Orientation Provided & Reviewed with Pt/Caregiver Including Roles  & Responsibilities: Yes   Decrease burden of Care through IP rehab admission: NA   Possible need for SNF placement upon discharge: not anticipated; pt has great support from husband and brother at DC.   Patient Condition: This patient's condition remains as documented in the consult dated 12/14/19, in which the Rehabilitation Physician determined and documented that the patient's condition is appropriate for intensive rehabilitative care in an inpatient rehabilitation facility. Will admit to inpatient rehab today.  Preadmission Screen Completed By: Megan Salon with day of admit updates provided by:  Cheri Rous, OT, 12/14/2019 10:32 AM ______________________________________________________________________   Discussed status with Dr. Carlis Abbott on 12/14/19 at 10:31AM and received approval for admission today.  Admission Coordinator: Day of admit updates provided by: Cheri Rous, time 10:31AM/Date 12/14/19

## 2019-12-12 NOTE — Progress Notes (Signed)
STROKE TEAM PROGRESS NOTE   INTERVAL HISTORY No family at the bedside. Pt lying in bed, awake alert, orientated. Still has right facial droop and right sided weakness numbness, but stable. Denies HA. She denies hx of HTN, currently BP well controlled on norvasc 5mg .   Vitals:   12/12/19 0400 12/12/19 0500 12/12/19 0600 12/12/19 0700  BP: 109/67 124/78 (!) 82/45 108/70  Pulse: 66 75 89 77  Resp: 18 14 18 15   Temp: 97.8 F (36.6 C)     TempSrc: Oral     SpO2: 98% 100% 99% 100%  Weight:       CBC:  Recent Labs  Lab 12/10/19 1501 12/10/19 1510  WBC 6.2  --   NEUTROABS 3.2  --   HGB 11.6* 12.2  HCT 35.2* 36.0  MCV 94.6  --   PLT 229  --    Basic Metabolic Panel:  Recent Labs  Lab 12/10/19 1501 12/10/19 1510  NA 131* 132*  K 4.3 4.7  CL 99 103  CO2 20*  --   GLUCOSE 98 94  BUN 13 15  CREATININE 0.72 0.60  CALCIUM 9.0  --    Lipid Panel: No results for input(s): CHOL, TRIG, HDL, CHOLHDL, VLDL, LDLCALC in the last 168 hours. HgbA1c: No results for input(s): HGBA1C in the last 168 hours. Urine Drug Screen:  Recent Labs  Lab 12/11/19 0620  LABOPIA NONE DETECTED  COCAINSCRNUR NONE DETECTED  LABBENZ NONE DETECTED  AMPHETMU NONE DETECTED  THCU NONE DETECTED  LABBARB NONE DETECTED    Alcohol Level  Recent Labs  Lab 12/10/19 1501  ETH <10    IMAGING past 24 hours ECHOCARDIOGRAM COMPLETE  Result Date: 12/11/2019    ECHOCARDIOGRAM REPORT   Patient Name:   Melissa Colon Date of Exam: 12/11/2019 Medical Rec #:  Dayton Bailiff         Height: Accession #:    12/13/2019        Weight:       110.0 lb Date of Birth:  [birth year]         BSA:          1.356 m Patient Age:    59 years          BP:           140/83 mmHg Patient Gender: F                 HR:           79 bpm. Exam Location:  Inpatient Procedure: 2D Echo, Cardiac Doppler and Color Doppler Indications:    CVA  History:        Patient has no prior history of Echocardiogram examinations.                 Stroke; Risk  Factors:Current Smoker. ETOH dependencce.  Sonographer:    [id number] Referring Phys: 08/15/[address] [facility]  Sonographer Comments: Image acquisition challenging due to respiratory motion. IMPRESSIONS  1. Left ventricular ejection fraction, by estimation, is 60 to 65%. The left ventricle has normal function. The left ventricle has no regional wall motion abnormalities. Left ventricular diastolic parameters are consistent with Grade I diastolic dysfunction (impaired relaxation).  2. Right ventricular systolic function is normal. The right ventricular size is normal. There is normal pulmonary artery systolic pressure.  3. The mitral valve is normal in structure. Mild mitral valve regurgitation. No evidence of mitral stenosis.  4. The aortic valve is normal in structure. Aortic valve  regurgitation is not visualized. No aortic stenosis is present.  5. The inferior vena cava is normal in size with greater than 50% respiratory variability, suggesting right atrial pressure of 3 mmHg. FINDINGS  Left Ventricle: Left ventricular ejection fraction, by estimation, is 60 to 65%. The left ventricle has normal function. The left ventricle has no regional wall motion abnormalities. The left ventricular internal cavity size was normal in size. There is  no left ventricular hypertrophy. Left ventricular diastolic parameters are consistent with Grade I diastolic dysfunction (impaired relaxation). Normal left ventricular filling pressure. Right Ventricle: The right ventricular size is normal. No increase in right ventricular wall thickness. Right ventricular systolic function is normal. There is normal pulmonary artery systolic pressure. The tricuspid regurgitant velocity is 2.47 m/s, and  with an assumed right atrial pressure of 3 mmHg, the estimated right ventricular systolic pressure is 27.4 mmHg. Left Atrium: Left atrial size was normal in size. Right Atrium: Right atrial size was normal in size. Pericardium: There is no  evidence of pericardial effusion. Mitral Valve: The mitral valve is normal in structure. Mild mitral valve regurgitation, with centrally-directed jet. No evidence of mitral valve stenosis. Tricuspid Valve: The tricuspid valve is normal in structure. Tricuspid valve regurgitation is trivial. No evidence of tricuspid stenosis. Aortic Valve: The aortic valve is normal in structure. Aortic valve regurgitation is not visualized. No aortic stenosis is present. Pulmonic Valve: The pulmonic valve was normal in structure. Pulmonic valve regurgitation is not visualized. No evidence of pulmonic stenosis. Aorta: The aortic root is normal in size and structure. Venous: The inferior vena cava is normal in size with greater than 50% respiratory variability, suggesting right atrial pressure of 3 mmHg. IAS/Shunts: No atrial level shunt detected by color flow Doppler.  LEFT VENTRICLE PLAX 2D LVIDd:         3.70 cm  Diastology LVIDs:         2.50 cm  LV e' medial:    5.66 cm/s LV PW:         1.00 cm  LV E/e' medial:  11.7 LV IVS:        0.90 cm  LV e' lateral:   9.03 cm/s LVOT diam:     2.10 cm  LV E/e' lateral: 7.3 LV SV:         88 LV SV Index:   65 LVOT Area:     3.46 cm  RIGHT VENTRICLE RV Basal diam:  2.60 cm RV S prime:     7.94 cm/s TAPSE (M-mode): 2.4 cm LEFT ATRIUM             Index       RIGHT ATRIUM          Index LA diam:        2.40 cm 1.77 cm/m  RA Area:     9.05 cm LA Vol (A2C):   24.4 ml 18.00 ml/m RA Volume:   17.10 ml 12.61 ml/m LA Vol (A4C):   19.3 ml 14.24 ml/m LA Biplane Vol: 22.1 ml 16.30 ml/m  AORTIC VALVE LVOT Vmax:   102.00 cm/s LVOT Vmean:  72.900 cm/s LVOT VTI:    0.253 m  AORTA Ao Root diam: 2.90 cm MITRAL VALVE               TRICUSPID VALVE MV Area (PHT): 2.56 cm    TR Peak grad:   24.4 mmHg MV Decel Time: 296 msec    TR Vmax:  247.00 cm/s MV E velocity: 66.00 cm/s MV A velocity: 75.80 cm/s  SHUNTS MV E/A ratio:  0.87        Systemic VTI:  0.25 m                            Systemic Diam: 2.10  cm Thurmon FairMihai Croitoru MD Electronically signed by Thurmon FairMihai Croitoru MD Signature Date/Time: 12/11/2019/11:35:52 AM    Final    VAS US CAROTID  Result Date: 12/11/2019 Carotid Arterial Duplex Study Indications:  Left thalamus ICH. Risk Factors: Current smoker. Performing Technologist: Gertie FeyMichelle Simonetti MHA, RDMS, RVT, RDCS  Examination Guidelines: A complete evaluation includes B-mode imaging, spectral Doppler, color Doppler, and power Doppler as needed of all accessible portions of each vessel. Bilateral testing is considered an integral part of a complete examination. Limited examinations for reoccurring indications may be performed as noted.  Right Carotid Findings: +----------+--------+--------+--------+-----------------------+--------+           PSV cm/sEDV cm/sStenosisPlaque Description     Comments +----------+--------+--------+--------+-----------------------+--------+ CCA Prox  109     16                                              +----------+--------+--------+--------+-----------------------+--------+ CCA Distal99      27              smooth and heterogenous         +----------+--------+--------+--------+-----------------------+--------+ ICA Prox  70      28              smooth and heterogenous         +----------+--------+--------+--------+-----------------------+--------+ ICA Distal116     42                                              +----------+--------+--------+--------+-----------------------+--------+ ECA       96      11                                              +----------+--------+--------+--------+-----------------------+--------+ +----------+--------+-------+----------------+-------------------+           PSV cm/sEDV cmsDescribe        Arm Pressure (mmHG) +----------+--------+-------+----------------+-------------------+ Subclavian150            Multiphasic, WNL                     +----------+--------+-------+----------------+-------------------+ +---------+--------+--+--------+--+---------+ VertebralPSV cm/s68EDV cm/s23Antegrade +---------+--------+--+--------+--+---------+  Left Carotid Findings: +----------+--------+--------+--------+-----------------------+--------+           PSV cm/sEDV cm/sStenosisPlaque Description     Comments +----------+--------+--------+--------+-----------------------+--------+ CCA Prox  123     23                                              +----------+--------+--------+--------+-----------------------+--------+ CCA Distal91      28                                              +----------+--------+--------+--------+-----------------------+--------+  ICA Prox  60      18              smooth and heterogenous         +----------+--------+--------+--------+-----------------------+--------+ ICA Distal80      29                                              +----------+--------+--------+--------+-----------------------+--------+ ECA       81      14                                              +----------+--------+--------+--------+-----------------------+--------+ +----------+--------+--------+----------------+-------------------+           PSV cm/sEDV cm/sDescribe        Arm Pressure (mmHG) +----------+--------+--------+----------------+-------------------+ [id number]              Multiphasic, WNL                    +----------+--------+--------+----------------+-------------------+ +---------+--------+--+--------+-+---------+ VertebralPSV cm/s50EDV cm/s8Antegrade +---------+--------+--+--------+-+---------+   Summary: Right Carotid: Velocities in the right ICA are consistent with a 1-39% stenosis. Left Carotid: Velocities in the left ICA are consistent with a 1-39% stenosis. Vertebrals:  Bilateral vertebral arteries demonstrate antegrade flow. Subclavians: Normal flow hemodynamics were seen in  bilateral subclavian              arteries. *See table(s) above for measurements and observations.     Preliminary     PHYSICAL EXAM   Temp:  [97.7 F (36.5 C)-98 F (36.7 C)] 97.8 F (36.6 C) (11/27 0400) Pulse Rate:  [66-95] 95 (11/27 0900) Resp:  [13-21] 21 (11/27 0900) BP: (82-137)/(45-99) 137/69 (11/27 0900) SpO2:  [97 %-100 %] 97 % (11/27 0900)  General - Well nourished, well developed, in no apparent distress.  Ophthalmologic - fundi not visualized due to noncooperation.  Cardiovascular - Regular rhythm and rate.  Neuro - Awake alert oriented to time, place and person.  Mild dysarthria.  No aphasia.  Follows commands well.  Extraocular movements full range without nystagmus, seems to have mild left gaze preference.  Visual field full.  Right lower facial weakness.  Tongue midline.  Motor system exam shows right 3/5 right UE proximal and 4/5 distal finger gripping. RLE 3/5 proximal and 4+/5 distal ankle PF/DF.  Sensation is decreased on the right compared to the left.  FTN ataxia on the right out of proportion to the degree of weakness.  Right plantar upgoing left downgoing.  Gait not tested.   ASSESSMENT/PLAN Melissa Colon is a 59 y.o. 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 CT showed L thalamic hemorrhage.   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   MRI  Stable L thalamic hemorrhage w/ edema into L cerebral peduncle and PLIC. Moderate Small vessel disease. Atrophy.   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    CT head repeat stable  Carotid Doppler - unremarkable  2D Echo -  EF 60 - 65%. No cardiac source of emboli identified.   LDL -  92  HgbA1c - 5.3  VTE prophylaxis - heparin subq   No antithrombotic prior to admission, no antithrombotics now due to ICH  Therapy recommendations: CIR  Disposition:  pending   ? Hypertension  Home meds:  None, no hx HTN  BP as high as 174/78 on admission   SBP goal < 140  on Norvasc 5  BP Stable . Long-term BP goal normotensive  ?  Hemorrhagic conversion  No history of hypertension  BP well controlled  CT head old left parietal cortical and subcortical infarcts  MRA head showed high-grade left P2 stenosis  Cannot rule out possible left PCA/thalamic infarct with hemorrhagic conversion  Hyperlipidemia  Home meds, none  LDL 92, goal less than 70  No statin now given ICH  Consider statin on discharge  Tobacco abuse  Current smoker  Smoking cessation counseling provided  Nicotine patch provided  Pt is willing to quit  Other Stroke Risk Factors  ETOH abuse, alcohol level <10, advised to drink no more than 1 drink(s) a day. On CIWA protocol.  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 This patient's care requires constant monitoring of vital signs, hemodynamics, respiratory and cardiac monitoring, review of multiple databases, neurological assessment, discussion with family, other specialists and medical decision making of high complexity. I spent 30 minutes of neurocritical care time in the care of this patient.   Marvel Plan, MD PhD Stroke Neurology 12/12/2019 5:04 PM    To contact Stroke Continuity provider, please refer to [email or web address]. After hours, contact General Neurology

## 2019-12-13 DIAGNOSIS — F1029 Alcohol dependence with unspecified alcohol-induced disorder: Secondary | ICD-10-CM

## 2019-12-13 DIAGNOSIS — F17200 Nicotine dependence, unspecified, uncomplicated: Secondary | ICD-10-CM

## 2019-12-13 LAB — BASIC METABOLIC PANEL
Anion gap: 11 (ref 5–15)
BUN: 14 mg/dL (ref 6–20)
CO2: 21 mmol/L — ABNORMAL LOW (ref 22–32)
Calcium: 8.8 mg/dL — ABNORMAL LOW (ref 8.9–10.3)
Chloride: 106 mmol/L (ref 98–111)
Creatinine, Ser: 0.59 mg/dL (ref 0.44–1.00)
GFR, Estimated: >60 mL/min (ref >60)
Glucose, Bld: 98 mg/dL (ref 70–99)
Potassium: 4.3 mmol/L (ref 3.5–5.1)
Sodium: 138 mmol/L (ref 135–145)

## 2019-12-13 LAB — CBC
HCT: 32.8 % — ABNORMAL LOW (ref 36.0–46.0)
Hemoglobin: 10.9 g/dL — ABNORMAL LOW (ref 12.0–15.0)
MCH: 31.5 pg (ref 26.0–34.0)
MCHC: 33.2 g/dL (ref 30.0–36.0)
MCV: 94.8 fL (ref 80.0–100.0)
Platelets: 236 10*3/uL (ref 150–400)
RBC: 3.46 MIL/uL — ABNORMAL LOW (ref 3.87–5.11)
RDW: 14.9 % (ref 11.5–15.5)
WBC: 5.7 10*3/uL (ref 4.0–10.5)
nRBC: 0 % (ref 0.0–0.2)

## 2019-12-13 MED ORDER — DM-GUAIFENESIN ER 30-600 MG PO TB12: 1.0000 | ORAL_TABLET | Freq: Two times a day (BID) | ORAL | Status: DC | PRN

## 2019-12-13 MED ORDER — SENNOSIDES-DOCUSATE SODIUM 8.6-50 MG PO TABS: 1.0000 | ORAL_TABLET | Freq: Every evening | ORAL | Status: DC | PRN

## 2019-12-13 NOTE — Progress Notes (Signed)
STROKE TEAM PROGRESS NOTE   INTERVAL HISTORY No family at bedside.  Patient neuro stable, no acute event overnight, no complaints.  PT/OT recommend CIR.  Vitals:   12/12/19 2000 12/12/19 2117 12/13/19 0038 12/13/19 0419  BP: (!) 142/79 122/82 114/64 131/72  Pulse: (!) 114 89 88 88  Resp: 18 18 18 18   Temp: 97.7 F (36.5 C) 97.8 F (36.6 C) 98.9 F (37.2 C) 98.8 F (37.1 C)  TempSrc: Oral Oral Oral Oral  SpO2: 96% 98% 97% 97%  Weight:       CBC:  Recent Labs  Lab 12/10/19 1501 12/10/19 1510 12/12/19 0721 12/13/19 0255  WBC 6.2   < > 5.5 5.7  NEUTROABS 3.2  --   --   --   HGB 11.6*   < > 11.3* 10.9*  HCT 35.2*   < > 33.9* 32.8*  MCV 94.6   < > 94.4 94.8  PLT 229   < > 228 236   < > = values in this interval not displayed.   Basic Metabolic Panel:  Recent Labs  Lab 12/12/19 0721 12/13/19 0255  NA 134* 138  K 4.0 4.3  CL 103 106  CO2 21* 21*  GLUCOSE 123* 98  BUN 9 14  CREATININE 0.60 0.59  CALCIUM 8.6* 8.8*   Lipid Panel:  Recent Labs  Lab 12/12/19 0721  CHOL 179  TRIG 90  HDL 69  CHOLHDL 2.6  VLDL 18  LDLCALC 92   HgbA1c:  Recent Labs  Lab 12/12/19 0721  HGBA1C 5.3   Urine Drug Screen:  Recent Labs  Lab 12/11/19 0620  LABOPIA NONE DETECTED  COCAINSCRNUR NONE DETECTED  LABBENZ NONE DETECTED  AMPHETMU NONE DETECTED  THCU NONE DETECTED  LABBARB NONE DETECTED    Alcohol Level  Recent Labs  Lab 12/10/19 1501  ETH <10    IMAGING past 24 hours No results found.  PHYSICAL EXAM   Temp:  [97.7 F (36.5 C)-99.2 F (37.3 C)] 98.8 F (37.1 C) (11/28 0419) Pulse Rate:  [77-118] 88 (11/28 0419) Resp:  [12-24] 18 (11/28 0419) BP: (108-150)/(61-93) 131/72 (11/28 0419) SpO2:  [96 %-100 %] 97 % (11/28 0419)  General - Well nourished, well developed, in no apparent distress.  Ophthalmologic - fundi not visualized due to noncooperation.  Cardiovascular - Regular rhythm and rate.  Neuro - Awake alert oriented to time, place and person.   Mild dysarthria.  No aphasia.  Follows commands well.  Extraocular movements full range without nystagmus, seems to have mild left gaze preference.  Visual field full.  Right lower facial weakness.  Tongue midline.  Motor system exam shows right 3/5 right UE proximal and 4/5 distal finger gripping. RLE 3/5 proximal and 4+/5 distal ankle PF/DF.  Sensation is decreased on the right compared to the left.  FTN ataxia on the right out of proportion to the degree of weakness.  Right plantar upgoing left downgoing.  Gait not tested.   ASSESSMENT/PLAN [name] is a 59 y.o. 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 CT showed L thalamic hemorrhage.   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 hemorrhagic conversion  CT head L thalamic hemorrhage w/ mild edema. 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   MRI  Stable L thalamic hemorrhage w/ edema into L  cerebral peduncle and PLIC. Moderate Small vessel disease. Atrophy.   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 High-grade L P2 stenosis.    CT head repeat stable  Carotid Doppler - unremarkable  2D Echo - EF 60 - 65%. No cardiac source of emboli identified.   LDL - 92  HgbA1c - 5.3  VTE prophylaxis - heparin subq   No antithrombotic prior to admission, no antithrombotics now due to ICH  Therapy recommendations: CIR  Disposition:  pending   ? Hypertension  Home meds:  None, no hx HTN  BP as high as 174/78 on admission   SBP goal < 160  On Norvasc 5  BP Stable . Long-term BP goal normotensive  ?  Hemorrhagic conversion  No history of hypertension  BP well controlled  CT head old left parietal cortical and subcortical infarcts  MRA head showed high-grade left P2 stenosis  Cannot rule out possible left PCA/thalamic infarct  with hemorrhagic conversion  Hyperlipidemia  Home meds, none  LDL 92, goal less than 70  No statin now given ICH  Consider statin on discharge  Tobacco abuse  Current smoker  Smoking cessation counseling provided  Nicotine patch provided  Pt is willing to quit  Other Stroke Risk Factors  ETOH abuse, alcohol level <10, advised to drink no more than 1 drink(s) a day. On CIWA protocol.  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 Please call with questions. Pt will follow up with stroke clinic NP at [facility] in about 4 weeks. Thanks for the consult.  Marvel Plan, MD PhD Stroke Neurology 12/13/2019 5:47 PM  To contact Stroke Continuity provider, please refer to [email or web address]. After hours, contact General Neurology

## 2019-12-13 NOTE — Progress Notes (Signed)
Inpatient Rehab Admissions Coordinator:    I offered a CIR bed to this patient this AM and she declined. She changed her mind shortly after, but I do not have a bed available for her any longer today. Will plan to admit her tomorrow.   Megan Salon, MS, CCC-SLP Rehab Admissions Coordinator  [phone] (celll) [phone] (office)

## 2019-12-13 NOTE — Progress Notes (Addendum)
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  CT head showed left-sided thalamic stroke confirmed on MRI brain.  Repeat CT head was stable.  MRI showed high-grade P2 stenosis.  Echocardiogram EF 60 to 65%, LDL 92, A1c 5.2, carotid Dopplers unremarkable.  PT recommended CIR therefore awaiting placement.  Possible statin at discharge.   Assessment & Plan:   Principal Problem:   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 Active Problems:   Hypertensive emergency   Alcohol dependence (HCC)   Tobacco use disorder   Left-sided thalamic stroke -CT head showed left-sided thalamic stroke confirmed on MRI brain.  Repeat CT head was stable.  MRI showed high-grade P2 stenosis.  Echocardiogram EF 60 to 65%, LDL 92, A1c 5.2, carotid Dopplers unremarkable.  PT recommended CIR therefore awaiting placement.  Possible statin at discharge. -Awaiting CIR placement, CIR team following  Essential hypertension -No prior history.  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  Pending placement CIR, patient unable to make decision.  TOC team  following Spoke with patient's brother, if patient does not go to CIR by tomorrow he will be able to take her home tomorrow      There is no height or weight on file to calculate BMI.      Subjective: Feels okay no complaints  Examination:  Constitutional: Not in acute distress Respiratory: Clear to auscultation bilaterally Cardiovascular: Normal sinus rhythm, no rubs Abdomen: Nontender nondistended good bowel sounds Musculoskeletal: No edema noted Skin: No rashes seen Neurologic: CN 2-12 grossly intact.  Right upper extremity strength in right lower extremity strength 3/5, left upper and lower extremity strength 4/5 Psychiatric: Normal judgment and insight. Alert and oriented x 3. Normal mood.  Objective: Vitals:   12/12/19 2000 12/12/19 2117 12/13/19 0038 12/13/19 0419  BP: (!) 142/79 122/82 114/64 131/72  Pulse: (!) 114 89 88 88  Resp: 18 18 18 18   Temp: 97.7 F (36.5 C) 97.8 F (36.6 C) 98.9 F (37.2 C) 98.8 F (37.1 C)  TempSrc: Oral Oral Oral Oral  SpO2: 96% 98% 97% 97%  Weight:        Intake/Output Summary (Last 24 hours) at 12/13/2019 0806 Last data filed at 12/13/2019 0600 Gross per 24 hour  Intake 120 ml  Output --  Net 120 ml   Filed Weights   12/10/19 1700  Weight: 49.9 kg     Data Reviewed:   CBC: Recent Labs  Lab 12/10/19 1501 12/10/19 1510 12/12/19 0721 12/13/19  0255  WBC 6.2  --  5.5 5.7  NEUTROABS 3.2  --   --   --   HGB 11.6* 12.2 11.3* 10.9*  HCT 35.2* 36.0 33.9* 32.8*  MCV 94.6  --  94.4 94.8  PLT 229  --  228 236   Basic Metabolic Panel: Recent Labs  Lab 12/10/19 1501 12/10/19 1510 12/12/19 0721 12/13/19 0255  NA 131* 132* 134* 138  K 4.3 4.7 4.0 4.3  CL 99 103 103 106  CO2 20*  --  21* 21*  GLUCOSE 98 94 123* 98  BUN 13 15 9 14   CREATININE 0.72 0.60 0.60 0.59  CALCIUM 9.0  --  8.6* 8.8*   GFR: CrCl cannot be calculated (Unknown ideal weight.). Liver Function Tests: Recent Labs  Lab 12/10/19 1501  AST 20   ALT 14  ALKPHOS 60  BILITOT 0.6  PROT 6.7  ALBUMIN 3.8   No results for input(s): LIPASE, AMYLASE in the last 168 hours. No results for input(s): AMMONIA in the last 168 hours. Coagulation Profile: Recent Labs  Lab 12/10/19 1501  INR 1.0   Cardiac Enzymes: No results for input(s): CKTOTAL, CKMB, CKMBINDEX, TROPONINI in the last 168 hours. BNP (last 3 results) No results for input(s): PROBNP in the last 8760 hours. HbA1C: Recent Labs    12/12/19 0721  HGBA1C 5.3   CBG: Recent Labs  Lab 12/10/19 1500  GLUCAP 93   Lipid Profile: Recent Labs    12/12/19 0721  CHOL 179  HDL 69  LDLCALC 92  TRIG 90  CHOLHDL 2.6   Thyroid Function Tests: No results for input(s): TSH, T4TOTAL, FREET4, T3FREE, THYROIDAB in the last 72 hours. Anemia Panel: No results for input(s): VITAMINB12, FOLATE, FERRITIN, TIBC, IRON, RETICCTPCT in the last 72 hours. Sepsis Labs: No results for input(s): PROCALCITON, LATICACIDVEN in the last 168 hours.  Recent Results (from the past 240 hour(s))  Resp Panel by RT-PCR (Flu A&B, Covid) Nasopharyngeal Swab     Status: None   Collection Time: 12/10/19  3:05 PM   Specimen: Nasopharyngeal Swab; Nasopharyngeal(NP) swabs in vial transport medium  Result Value Ref Range Status   SARS Coronavirus 2 by RT PCR NEGATIVE NEGATIVE Final    Comment: (NOTE) SARS-CoV-2 target nucleic acids are NOT DETECTED.  The SARS-CoV-2 RNA is generally detectable in upper respiratory specimens during the acute phase of infection. The lowest concentration of SARS-CoV-2 viral copies this assay can detect is 138 copies/mL. A negative result does not preclude SARS-Cov-2 infection and should not be used as the sole basis for treatment or other patient management decisions. A negative result may occur with  improper specimen collection/handling, submission of specimen other than nasopharyngeal swab, presence of viral mutation(s) within the areas targeted by this assay, and  inadequate number of viral copies(<138 copies/mL). A negative result must be combined with clinical observations, patient history, and epidemiological information. The expected result is Negative.  Fact Sheet for Patients:  12/12/19  Fact Sheet for Healthcare Providers:  BloggerCourse.com  This test is no t yet approved or cleared by the [employer] FDA and  has been authorized for detection and/or diagnosis of SARS-CoV-2 by FDA under an Emergency Use Authorization (EUA). This EUA will remain  in effect (meaning this test can be used) for the duration of the COVID-19 declaration under Section 564(b)(1) of the Act, 21 U.S.C.section 360bbb-3(b)(1), unless the authorization is terminated  or revoked sooner.       Influenza A by PCR NEGATIVE NEGATIVE  Final   Influenza B by PCR NEGATIVE NEGATIVE Final    Comment: (NOTE) The Xpert Xpress SARS-CoV-2/FLU/RSV plus assay is intended as an aid in the diagnosis of influenza from Nasopharyngeal swab specimens and should not be used as a sole basis for treatment. Nasal washings and aspirates are unacceptable for Xpert Xpress SARS-CoV-2/FLU/RSV testing.  Fact Sheet for Patients: BloggerCourse.com  Fact Sheet for Healthcare Providers: SeriousBroker.it  This test is not yet approved or cleared by the [place] FDA and has been authorized for detection and/or diagnosis of SARS-CoV-2 by FDA under an Emergency Use Authorization (EUA). This EUA will remain in effect (meaning this test can be used) for the duration of the COVID-19 declaration under Section 564(b)(1) of the Act, 21 U.S.C. section 360bbb-3(b)(1), unless the authorization is terminated or revoked.  Performed at [facility], [address], [place], [place] [address]   MRSA PCR Screening     Status: None   Collection Time: 12/10/19  6:42 PM   Specimen: Nasal  Mucosa; Nasopharyngeal  Result Value Ref Range Status   MRSA by PCR NEGATIVE NEGATIVE Final    Comment:        The GeneXpert MRSA Assay (FDA approved for NASAL specimens only), is one component of a comprehensive MRSA colonization surveillance program. It is not intended to diagnose MRSA infection nor to guide or monitor treatment for MRSA infections. Performed at [facility], [address], [place], [place] [address]          Radiology Studies: ECHOCARDIOGRAM COMPLETE  Result Date: 12/11/2019    ECHOCARDIOGRAM REPORT   Patient Name:   ANALAYA HOEY Date of Exam: 12/11/2019 Medical Rec #:  [id number]         Height: Accession #:    [id number]        Weight:       110.0 lb Date of Birth:  Oct 27, 1960         BSA:          1.356 m Patient Age:    59 years          BP:           140/83 mmHg Patient Gender: F                 HR:           79 bpm. Exam Location:  Inpatient Procedure: 2D Echo, Cardiac Doppler and Color Doppler Indications:    CVA  History:        Patient has no prior history of Echocardiogram examinations.                 Stroke; Risk Factors:Current Smoker. ETOH dependencce.  Sonographer:    Lavenia Atlas Referring Phys: [id number] [facility]  Sonographer Comments: Image acquisition challenging due to respiratory motion. IMPRESSIONS  1. Left ventricular ejection fraction, by estimation, is 60 to 65%. The left ventricle has normal function. The left ventricle has no regional wall motion abnormalities. Left ventricular diastolic parameters are consistent with Grade I diastolic dysfunction (impaired relaxation).  2. Right ventricular systolic function is normal. The right ventricular size is normal. There is normal pulmonary artery systolic pressure.  3. The mitral valve is normal in structure. Mild mitral valve regurgitation. No evidence of mitral stenosis.  4. The aortic valve is normal in structure. Aortic valve regurgitation is not visualized. No aortic  stenosis is present.  5. The inferior vena cava is normal in size with greater  than 50% respiratory variability, suggesting right atrial pressure of 3 mmHg. FINDINGS  Left Ventricle: Left ventricular ejection fraction, by estimation, is 60 to 65%. The left ventricle has normal function. The left ventricle has no regional wall motion abnormalities. The left ventricular internal cavity size was normal in size. There is  no left ventricular hypertrophy. Left ventricular diastolic parameters are consistent with Grade I diastolic dysfunction (impaired relaxation). Normal left ventricular filling pressure. Right Ventricle: The right ventricular size is normal. No increase in right ventricular wall thickness. Right ventricular systolic function is normal. There is normal pulmonary artery systolic pressure. The tricuspid regurgitant velocity is 2.47 m/s, and  with an assumed right atrial pressure of 3 mmHg, the estimated right ventricular systolic pressure is 27.4 mmHg. Left Atrium: Left atrial size was normal in size. Right Atrium: Right atrial size was normal in size. Pericardium: There is no evidence of pericardial effusion. Mitral Valve: The mitral valve is normal in structure. Mild mitral valve regurgitation, with centrally-directed jet. No evidence of mitral valve stenosis. Tricuspid Valve: The tricuspid valve is normal in structure. Tricuspid valve regurgitation is trivial. No evidence of tricuspid stenosis. Aortic Valve: The aortic valve is normal in structure. Aortic valve regurgitation is not visualized. No aortic stenosis is present. Pulmonic Valve: The pulmonic valve was normal in structure. Pulmonic valve regurgitation is not visualized. No evidence of pulmonic stenosis. Aorta: The aortic root is normal in size and structure. Venous: The inferior vena cava is normal in size with greater than 50% respiratory variability, suggesting right atrial pressure of 3 mmHg. IAS/Shunts: No atrial level shunt detected by  color flow Doppler.  LEFT VENTRICLE PLAX 2D LVIDd:         3.70 cm  Diastology LVIDs:         2.50 cm  LV e' medial:    5.66 cm/s LV PW:         1.00 cm  LV E/e' medial:  11.7 LV IVS:        0.90 cm  LV e' lateral:   9.03 cm/s LVOT diam:     2.10 cm  LV E/e' lateral: 7.3 LV SV:         88 LV SV Index:   65 LVOT Area:     3.46 cm  RIGHT VENTRICLE RV Basal diam:  2.60 cm RV S prime:     7.94 cm/s TAPSE (M-mode): 2.4 cm LEFT ATRIUM             Index       RIGHT ATRIUM          Index LA diam:        2.40 cm 1.77 cm/m  RA Area:     9.05 cm LA Vol (A2C):   24.4 ml 18.00 ml/m RA Volume:   17.10 ml 12.61 ml/m LA Vol (A4C):   19.3 ml 14.24 ml/m LA Biplane Vol: 22.1 ml 16.30 ml/m  AORTIC VALVE LVOT Vmax:   102.00 cm/s LVOT Vmean:  72.900 cm/s LVOT VTI:    0.253 m  AORTA Ao Root diam: 2.90 cm MITRAL VALVE               TRICUSPID VALVE MV Area (PHT): 2.56 cm    TR Peak grad:   24.4 mmHg MV Decel Time: 296 msec    TR Vmax:        247.00 cm/s MV E velocity: 66.00 cm/s MV A velocity: 75.80 cm/s  SHUNTS MV E/A ratio:  0.87  Systemic VTI:  0.25 m                            Systemic Diam: 2.10 cm Thurmon Fair MD Electronically signed by Thurmon Fair MD Signature Date/Time: 12/11/2019/11:35:52 AM    Final    VAS US CAROTID  Result Date: 12/11/2019 Carotid Arterial Duplex Study Indications:  Left thalamus ICH. Risk Factors: Current smoker. Performing Technologist: Gertie Fey MHA, RDMS, RVT, RDCS  Examination Guidelines: A complete evaluation includes B-mode imaging, spectral Doppler, color Doppler, and power Doppler as needed of all accessible portions of each vessel. Bilateral testing is considered an integral part of a complete examination. Limited examinations for reoccurring indications may be performed as noted.  Right Carotid Findings: +----------+--------+--------+--------+-----------------------+--------+           PSV cm/sEDV cm/sStenosisPlaque Description     Comments  +----------+--------+--------+--------+-----------------------+--------+ CCA Prox  109     16                                              +----------+--------+--------+--------+-----------------------+--------+ CCA Distal99      27              smooth and heterogenous         +----------+--------+--------+--------+-----------------------+--------+ ICA Prox  70      28              smooth and heterogenous         +----------+--------+--------+--------+-----------------------+--------+ ICA Distal116     42                                              +----------+--------+--------+--------+-----------------------+--------+ ECA       96      11                                              +----------+--------+--------+--------+-----------------------+--------+ +----------+--------+-------+----------------+-------------------+           PSV cm/sEDV cmsDescribe        Arm Pressure (mmHG) +----------+--------+-------+----------------+-------------------+ Subclavian150            Multiphasic, WNL                    +----------+--------+-------+----------------+-------------------+ +---------+--------+--+--------+--+---------+ VertebralPSV cm/s68EDV cm/s23Antegrade +---------+--------+--+--------+--+---------+  Left Carotid Findings: +----------+--------+--------+--------+-----------------------+--------+           PSV cm/sEDV cm/sStenosisPlaque Description     Comments +----------+--------+--------+--------+-----------------------+--------+ CCA Prox  123     23                                              +----------+--------+--------+--------+-----------------------+--------+ CCA Distal91      28                                              +----------+--------+--------+--------+-----------------------+--------+ ICA Prox  60      18  smooth and heterogenous          +----------+--------+--------+--------+-----------------------+--------+ ICA Distal80      29                                              +----------+--------+--------+--------+-----------------------+--------+ ECA       81      14                                              +----------+--------+--------+--------+-----------------------+--------+ +----------+--------+--------+----------------+-------------------+           PSV cm/sEDV cm/sDescribe        Arm Pressure (mmHG) +----------+--------+--------+----------------+-------------------+ [id number]              Multiphasic, WNL                    +----------+--------+--------+----------------+-------------------+ +---------+--------+--+--------+-+---------+ VertebralPSV cm/s50EDV cm/s8Antegrade +---------+--------+--+--------+-+---------+   Summary: Right Carotid: Velocities in the right ICA are consistent with a 1-39% stenosis. Left Carotid: Velocities in the left ICA are consistent with a 1-39% stenosis. Vertebrals:  Bilateral vertebral arteries demonstrate antegrade flow. Subclavians: Normal flow hemodynamics were seen in bilateral subclavian              arteries. *See table(s) above for measurements and observations.     Preliminary         Scheduled Meds: .  stroke: mapping our early stages of recovery book   Does not apply Once  . amLODipine  5 mg Oral Daily  . Chlorhexidine Gluconate Cloth  6 each Topical Daily  . folic acid  1 mg Oral Daily  . heparin injection (subcutaneous)  5,000 Units Subcutaneous Q12H  . multivitamin with minerals  1 tablet Oral Daily  . nicotine  14 mg Transdermal Daily  . pantoprazole  40 mg Oral QHS  . senna-docusate  1 tablet Oral BID  . thiamine  100 mg Oral Daily   Continuous Infusions:   LOS: 3 days   Time spent= 35 mins     Joline Maxcy, MD [facility]  If 7PM-7AM, please contact night-coverage  12/13/2019, 8:06 AM

## 2019-12-13 NOTE — TOC Initial Note (Signed)
Transition of Care [facility]) - Initial/Assessment Note    Patient Details  Name: Melissa Colon MRN: [id number] Date of Birth: [birth year]  [facility] CM/SW Contact:    Kermit Balo, RN Phone Number: 12/13/2019, 12:31 PM  Clinical Narrative:                 Pt and spouse are wanting CIR. CIR will have a bed for her tomorrow.  TOC following for any further needs.   Expected Discharge Plan: IP Rehab Facility Barriers to Discharge: Inadequate or no insurance, Barriers Unresolved (comment), Continued Medical Work up   Patient Goals and CMS Choice   CMS Medicare.gov Compare Post Acute Care list provided to:: Patient Choice offered to / list presented to : Patient, Spouse  Expected Discharge Plan and Services Expected Discharge Plan: IP Rehab Facility   Discharge Planning Services: CM Consult Post Acute Care Choice: IP Rehab                                        Prior Living Arrangements/Services   Lives with:: Spouse                   Activities of Daily Living Home Assistive Devices/Equipment: None ADL Screening (condition at time of admission) Patient's cognitive ability adequate to safely complete daily activities?: Yes Is the patient deaf or have difficulty hearing?: No Does the patient have difficulty seeing, even when wearing glasses/contacts?: No Does the patient have difficulty concentrating, remembering, or making decisions?: No Patient able to express need for assistance with ADLs?: No Does the patient have difficulty dressing or bathing?: No Independently performs ADLs?: Yes (appropriate for developmental age) Does the patient have difficulty walking or climbing stairs?: Yes Weakness of Legs: Right Weakness of Arms/Hands: Right  Permission Sought/Granted                  Emotional Assessment              Admission diagnosis:  Intracranial hemorrhage (HCC) [I62.9] Hemorrhagic stroke (HCC) [I61.9] ICH  (intracerebral hemorrhage) (HCC) [I61.9] Patient Active Problem List   Diagnosis Date Noted  . Hypertensive emergency 12/11/2019  . Alcohol dependence (HCC) 12/11/2019  . Tobacco use disorder 12/11/2019  . 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 12/10/2019   PCP:  Pcp, No Pharmacy:   [facility] - [place], [place] - [address] [place] [address] Phone: [phone] Fax: [phone]     Social Determinants of Health (SDOH) Interventions    Readmission Risk Interventions No flowsheet data found.

## 2019-12-14 ENCOUNTER — Encounter (HOSPITAL_COMMUNITY): Payer: Self-pay | Admitting: Physical Medicine & Rehabilitation

## 2019-12-14 ENCOUNTER — Other Ambulatory Visit: Payer: Self-pay

## 2019-12-14 ENCOUNTER — Inpatient Hospital Stay (HOSPITAL_COMMUNITY)
Admission: RE | Admit: 2019-12-14 | Discharge: 2020-01-05 | DRG: 056 | Disposition: A | Discharge status: Home / Self Care | Payer: Self-pay | Source: Intra-hospital | Attending: Physical Medicine & Rehabilitation | Admitting: Physical Medicine & Rehabilitation

## 2019-12-14 DIAGNOSIS — Z6820 Body mass index (BMI) 20.0-20.9, adult: Secondary | ICD-10-CM

## 2019-12-14 DIAGNOSIS — Z8673 Personal history of transient ischemic attack (TIA), and cerebral infarction without residual deficits: Secondary | ICD-10-CM

## 2019-12-14 DIAGNOSIS — Z716 Tobacco abuse counseling: Secondary | ICD-10-CM

## 2019-12-14 DIAGNOSIS — Z7141 Alcohol abuse counseling and surveillance of alcoholic: Secondary | ICD-10-CM

## 2019-12-14 DIAGNOSIS — F1020 Alcohol dependence, uncomplicated: Secondary | ICD-10-CM | POA: Diagnosis present

## 2019-12-14 DIAGNOSIS — E43 Unspecified severe protein-calorie malnutrition: Secondary | ICD-10-CM | POA: Diagnosis present

## 2019-12-14 DIAGNOSIS — I610 Nontraumatic intracerebral hemorrhage in hemisphere, subcortical: Secondary | ICD-10-CM

## 2019-12-14 DIAGNOSIS — Z86011 Personal history of benign neoplasm of the brain: Secondary | ICD-10-CM

## 2019-12-14 DIAGNOSIS — I10 Essential (primary) hypertension: Secondary | ICD-10-CM | POA: Diagnosis present

## 2019-12-14 DIAGNOSIS — I69292 Facial weakness following other nontraumatic intracranial hemorrhage: Secondary | ICD-10-CM

## 2019-12-14 DIAGNOSIS — I69251 Hemiplegia and hemiparesis following other nontraumatic intracranial hemorrhage affecting right dominant side: Principal | ICD-10-CM

## 2019-12-14 DIAGNOSIS — I69222 Dysarthria following other nontraumatic intracranial hemorrhage: Secondary | ICD-10-CM

## 2019-12-14 DIAGNOSIS — I619 Nontraumatic intracerebral hemorrhage, unspecified: Secondary | ICD-10-CM | POA: Diagnosis present

## 2019-12-14 DIAGNOSIS — I69220 Aphasia following other nontraumatic intracranial hemorrhage: Secondary | ICD-10-CM

## 2019-12-14 DIAGNOSIS — D62 Acute posthemorrhagic anemia: Secondary | ICD-10-CM | POA: Diagnosis present

## 2019-12-14 DIAGNOSIS — B952 Enterococcus as the cause of diseases classified elsewhere: Secondary | ICD-10-CM | POA: Diagnosis present

## 2019-12-14 DIAGNOSIS — H532 Diplopia: Secondary | ICD-10-CM | POA: Diagnosis present

## 2019-12-14 DIAGNOSIS — K029 Dental caries, unspecified: Secondary | ICD-10-CM | POA: Diagnosis present

## 2019-12-14 DIAGNOSIS — F17210 Nicotine dependence, cigarettes, uncomplicated: Secondary | ICD-10-CM | POA: Diagnosis present

## 2019-12-14 DIAGNOSIS — Z79899 Other long term (current) drug therapy: Secondary | ICD-10-CM

## 2019-12-14 DIAGNOSIS — N390 Urinary tract infection, site not specified: Secondary | ICD-10-CM | POA: Diagnosis present

## 2019-12-14 LAB — CBC
HCT: 35.1 % — ABNORMAL LOW (ref 36.0–46.0)
Hemoglobin: 11.3 g/dL — ABNORMAL LOW (ref 12.0–15.0)
MCH: 30.9 pg (ref 26.0–34.0)
MCHC: 32.2 g/dL (ref 30.0–36.0)
MCV: 95.9 fL (ref 80.0–100.0)
Platelets: 262 10*3/uL (ref 150–400)
RBC: 3.66 MIL/uL — ABNORMAL LOW (ref 3.87–5.11)
RDW: 14.9 % (ref 11.5–15.5)
WBC: 5.5 10*3/uL (ref 4.0–10.5)
nRBC: 0 % (ref 0.0–0.2)

## 2019-12-14 LAB — BASIC METABOLIC PANEL
Anion gap: 11 (ref 5–15)
BUN: 13 mg/dL (ref 6–20)
CO2: 22 mmol/L (ref 22–32)
Calcium: 8.8 mg/dL — ABNORMAL LOW (ref 8.9–10.3)
Chloride: 104 mmol/L (ref 98–111)
Creatinine, Ser: 0.49 mg/dL (ref 0.44–1.00)
GFR, Estimated: >60 mL/min (ref >60)
Glucose, Bld: 91 mg/dL (ref 70–99)
Potassium: 3.6 mmol/L (ref 3.5–5.1)
Sodium: 137 mmol/L (ref 135–145)

## 2019-12-14 LAB — MAGNESIUM: Magnesium: 2.1 mg/dL (ref 1.7–2.4)

## 2019-12-14 MED ORDER — ACETAMINOPHEN 325 MG PO TABS
650.0000 mg | ORAL_TABLET | ORAL | Status: DC | PRN
  Administered 2019-12-16 – 2019-12-30 (×15): 650 mg via ORAL
  Filled 2019-12-14 (×15): qty 2

## 2019-12-14 MED ORDER — THIAMINE HCL 100 MG PO TABS
100.0000 mg | ORAL_TABLET | Freq: Every day | ORAL | Status: DC
Start: 2019-12-14 — End: 2020-01-05

## 2019-12-14 MED ORDER — ADULT MULTIVITAMIN W/MINERALS CH
1.0000 | ORAL_TABLET | Freq: Every day | ORAL | Status: DC
  Administered 2019-12-15 – 2020-01-05 (×22): 1 via ORAL
  Filled 2019-12-14 (×22): qty 1

## 2019-12-14 MED ORDER — HEPARIN SODIUM (PORCINE) 5000 UNIT/ML IJ SOLN: 5000.0000 [IU] | Freq: Three times a day (TID) | INTRAMUSCULAR | Status: DC

## 2019-12-14 MED ORDER — ADULT MULTIVITAMIN W/MINERALS CH
1.0000 | ORAL_TABLET | Freq: Every day | ORAL | Status: DC
Start: 2019-12-14 — End: 2022-07-27

## 2019-12-14 MED ORDER — HEPARIN SODIUM (PORCINE) 5000 UNIT/ML IJ SOLN
5000.0000 [IU] | Freq: Two times a day (BID) | INTRAMUSCULAR | Status: DC
  Administered 2019-12-14 – 2020-01-05 (×44): 5000 [IU] via SUBCUTANEOUS
  Filled 2019-12-14 (×44): qty 1

## 2019-12-14 MED ORDER — DM-GUAIFENESIN ER 30-600 MG PO TB12: 1.0000 | ORAL_TABLET | Freq: Two times a day (BID) | ORAL | Status: DC | PRN

## 2019-12-14 MED ORDER — SORBITOL 70 % SOLN: 30.0000 mL | Freq: Every day | Status: DC | PRN

## 2019-12-14 MED ORDER — NICOTINE 14 MG/24HR TD PT24
14.0000 mg | MEDICATED_PATCH | Freq: Every day | TRANSDERMAL | 0 refills | Status: DC
Start: 2019-12-14 — End: 2020-01-04

## 2019-12-14 MED ORDER — ACETAMINOPHEN 325 MG PO TABS
650.0000 mg | ORAL_TABLET | ORAL | Status: DC | PRN
Start: 2019-12-14 — End: 2022-07-27

## 2019-12-14 MED ORDER — SENNOSIDES-DOCUSATE SODIUM 8.6-50 MG PO TABS: 2.0000 | ORAL_TABLET | Freq: Every evening | ORAL | Status: DC | PRN

## 2019-12-14 MED ORDER — AMLODIPINE BESYLATE 5 MG PO TABS
5.0000 mg | ORAL_TABLET | Freq: Every day | ORAL | Status: DC
  Administered 2019-12-15 – 2019-12-26 (×12): 5 mg via ORAL
  Filled 2019-12-14 (×12): qty 1

## 2019-12-14 MED ORDER — PANTOPRAZOLE SODIUM 40 MG PO TBEC
40.0000 mg | DELAYED_RELEASE_TABLET | Freq: Every day | ORAL | Status: DC
Start: 2019-12-14 — End: 2020-01-04

## 2019-12-14 MED ORDER — FOLIC ACID 1 MG PO TABS
1.0000 mg | ORAL_TABLET | Freq: Every day | ORAL | Status: DC
Start: 2019-12-14 — End: 2020-01-04

## 2019-12-14 MED ORDER — SENNOSIDES-DOCUSATE SODIUM 8.6-50 MG PO TABS: 1.0000 | ORAL_TABLET | Freq: Every evening | ORAL | Status: DC | PRN

## 2019-12-14 MED ORDER — AMLODIPINE BESYLATE 5 MG PO TABS
5.0000 mg | ORAL_TABLET | Freq: Every day | ORAL | Status: DC
Start: 2019-12-14 — End: 2020-01-05

## 2019-12-14 MED ORDER — PANTOPRAZOLE SODIUM 40 MG PO TBEC
40.0000 mg | DELAYED_RELEASE_TABLET | Freq: Every day | ORAL | Status: DC
  Administered 2019-12-14 – 2020-01-04 (×22): 40 mg via ORAL
  Filled 2019-12-14 (×8): qty 1
  Filled 2019-12-14: qty 2
  Filled 2019-12-14 (×8): qty 1
  Filled 2019-12-14: qty 2
  Filled 2019-12-14 (×5): qty 1

## 2019-12-14 MED ORDER — SENNOSIDES-DOCUSATE SODIUM 8.6-50 MG PO TABS
1.0000 | ORAL_TABLET | Freq: Two times a day (BID) | ORAL | Status: DC
  Administered 2019-12-14: 1 via ORAL
  Filled 2019-12-14 (×2): qty 1

## 2019-12-14 MED ORDER — ACETAMINOPHEN 650 MG RE SUPP: 650.0000 mg | RECTAL | Status: DC | PRN

## 2019-12-14 MED ORDER — ATORVASTATIN CALCIUM 40 MG PO TABS
40.0000 mg | ORAL_TABLET | Freq: Every day | ORAL | Status: DC
  Administered 2019-12-14 – 2020-01-05 (×23): 40 mg via ORAL
  Filled 2019-12-14 (×24): qty 1

## 2019-12-14 MED ORDER — ATORVASTATIN CALCIUM 40 MG PO TABS: 40.0000 mg | ORAL_TABLET | Freq: Every day | ORAL | 11 refills | Status: DC

## 2019-12-14 MED ORDER — FOLIC ACID 1 MG PO TABS
1.0000 mg | ORAL_TABLET | Freq: Every day | ORAL | Status: DC
  Administered 2019-12-15 – 2019-12-24 (×10): 1 mg via ORAL
  Filled 2019-12-14 (×10): qty 1

## 2019-12-14 MED ORDER — POTASSIUM CHLORIDE CRYS ER 20 MEQ PO TBCR
40.0000 meq | EXTENDED_RELEASE_TABLET | Freq: Once | ORAL | Status: AC
  Administered 2019-12-14: 40 meq via ORAL
  Filled 2019-12-14: qty 2

## 2019-12-14 MED ORDER — ACETAMINOPHEN 160 MG/5ML PO SOLN: 650.0000 mg | ORAL | Status: DC | PRN

## 2019-12-14 MED ORDER — NICOTINE 14 MG/24HR TD PT24
14.0000 mg | MEDICATED_PATCH | Freq: Every day | TRANSDERMAL | Status: DC
  Administered 2019-12-14 – 2020-01-04 (×22): 14 mg via TRANSDERMAL
  Filled 2019-12-14 (×22): qty 1

## 2019-12-14 MED ORDER — THIAMINE HCL 100 MG PO TABS
100.0000 mg | ORAL_TABLET | Freq: Every day | ORAL | Status: DC
  Administered 2019-12-15 – 2019-12-24 (×10): 100 mg via ORAL
  Filled 2019-12-14 (×10): qty 1

## 2019-12-14 NOTE — Progress Notes (Signed)
Horton Chin, MD  Physician  Physical Medicine and Rehabilitation  PMR Pre-admission     Signed  Date of Service:  12/12/2019  2:30 PM      Related encounter: ED to Hosp-Admission (Current) from 12/10/2019 in [facility]      Signed       PMR Admission Coordinator Pre-Admission Assessment   Patient: Melissa Colon is an 59 y.o., female MRN: [id number] DOB: 1960/06/16 Height:   Weight: 49.9 kg                                                                                                                                                  Insurance Information HMO:     PPO:      PCP:      IPA:      80/20:      OTHER:  PRIMARY: Uninsured *pt and her husband have been informed of daily cost of care ($3,500). They would like to proceed.    Financial Counselor: Cathlean Marseilles      Phone#: [phone]   *Per [facility] note, pt has been referred to [facility]   The "Data Collection Information Summary" for patients in Inpatient Rehabilitation Facilities with attached "Privacy Act Statement-Health Care Records" was provided and verbally reviewed with: N/A   Emergency Contact Information         Contact Information     Name Relation Home Work Mobile    [name] Brother     [phone]    Madaline Guthrie Niece     [phone]       Current Medical History  Patient Admitting Diagnosis: ICH History of Present Illness: [name] is an 59 y.o. 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 Pt. Presented to ED with right sided weakness, dysarthria and left gaze preference. Code stroke was called. 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 This could correlate with the thalamic hemorrhage. No infarct present in the more distal left PCA territory.  Carotid Doppler was unremarkable.  2D Echo was performed and revealed  EF 60 - 65%.No cardiac source of emboli identified. Pt. Presents with functional deficits in mobility and ADLs, dysarthria, and mild cognitive impairment and therapies are recommending CIR following discharge. Pt is to admit to CIR on 12/14/19.   Complete NIHSS TOTAL: 13 Glasgow Coma Scale Score: 15   Past Medical History      Past Medical History:  Diagnosis Date  . Alcohol  dependence (HCC)    . H/O brain tumor    . Smoker        Family History  family history includes Diabetes in her mother.   Prior Rehab/Hospitalizations:  Has the patient had prior rehab or hospitalizations prior to admission? No   Has the patient had major surgery during 100 days prior to admission? No   Current Medications    Current Facility-Administered Medications:  .   stroke: mapping our early stages of recovery book, , Does not apply, Once, Erick Blinks, MD .  acetaminophen (TYLENOL) tablet 650 mg, 650 mg, Oral, Q4H PRN, 650 mg at 12/13/19 0625 **OR** acetaminophen (TYLENOL) 160 MG/5ML solution 650 mg, 650 mg, Per Tube, Q4H PRN **OR** acetaminophen (TYLENOL) suppository 650 mg, 650 mg, Rectal, Q4H PRN, Erick Blinks, MD .  amLODipine (NORVASC) tablet 5 mg, 5 mg, Oral, Daily, Micki Riley, MD, 5 mg at 12/14/19 0921 .  dextromethorphan-guaiFENesin (MUCINEX DM) 30-600 MG per 12 hr tablet 1 tablet, 1 tablet, Oral, BID PRN, Amin, Ankit Chirag, MD .  folic acid (FOLVITE) tablet 1 mg, 1 mg, Oral, Daily, Kirby-Graham, Beather Arbour, NP, 1 mg at 12/14/19 0920 .  heparin injection 5,000 Units, 5,000 Units, Subcutaneous, Q12H, Marvel Plan, MD, 5,000 Units at 12/14/19 0920 .  hydrALAZINE (APRESOLINE)  injection 10 mg, 10 mg, Intravenous, Q8H PRN, Delia Heady S, MD .  labetalol (NORMODYNE) injection 20 mg, 20 mg, Intravenous, Q2H PRN, Micki Riley, MD, 20 mg at 12/11/19 0805 .  LORazepam (ATIVAN) injection 1-2 mg, 1-2 mg, Intravenous, Q1H PRN, Kirby-Graham, Beather Arbour, NP .  multivitamin with minerals tablet 1 tablet, 1 tablet, Oral, Daily, Kirby-Graham, Beather Arbour, NP, 1 tablet at 12/14/19 0921 .  nicotine (NICODERM CQ - dosed in mg/24 hours) patch 14 mg, 14 mg, Transdermal, Daily, Kirby-Graham, Beather Arbour, NP, 14 mg at 12/13/19 2217 .  pantoprazole (PROTONIX) EC tablet 40 mg, 40 mg, Oral, QHS, Micki Riley, MD, 40 mg at 12/13/19 2216 .  senna-docusate (Senokot-S) tablet 1 tablet, 1 tablet, Oral, BID, Erick Blinks, MD, 1 tablet at 12/14/19 0920 .  senna-docusate (Senokot-S) tablet 1 tablet, 1 tablet, Oral, QHS PRN, Amin, Ankit Chirag, MD .  thiamine tablet 100 mg, 100 mg, Oral, Daily, Kirby-Graham, Beather Arbour, NP, 100 mg at 12/14/19 0920   Patients Current Diet:     Diet Order                      Diet regular Room service appropriate? Yes with Assist; Fluid consistency: Thin  Diet effective now                      Precautions / Restrictions Precautions Precautions: Fall Precaution Comments: R hemiparesis and incoordination Restrictions Weight Bearing Restrictions: No    Has the patient had 2 or more falls or a fall with injury in the past year?No   Prior Activity Level Limited Community (1-2x/wk): Went out for appointments and errands   Prior Functional Level Prior Function Level of Independence: Independent Comments: Pt reports she was fully independent.  Does not drive, does not work    Self Care: Did the patient need help bathing, dressing, using the toilet or eating?  Independent   Indoor Mobility: Did the patient need assistance with walking from room to room (with or without device)? Independent   Stairs: Did the patient need assistance with internal or  external stairs (with or without device)? Independent   Functional  Cognition: Did the patient need help planning regular tasks such as shopping or remembering to take medications? Independent   Home Assistive Devices / Equipment Home Assistive Devices/Equipment: None Home Equipment: Cane - single point   Prior Device Use: Indicate devices/aids used by the patient prior to current illness, exacerbation or injury? None of the above   Current Functional Level Cognition   Arousal/Alertness: Awake/alert Overall Cognitive Status: No family/caregiver present to determine baseline cognitive functioning Orientation Level: Oriented X4 Following Commands: Follows one step commands with increased time Safety/Judgement: Decreased awareness of safety General Comments: Pt oriented to being in a hospital, not [facility] or [facility]. Pt requires increased time and effort to verbally and physically respond to questions and mobility commands, respectively. Demonstrates decreased responsiveness and command following when up, suspect due to anxiety. Attention: Sustained Sustained Attention: Impaired Sustained Attention Impairment: Verbal basic, Functional basic Memory: Impaired Memory Impairment: Retrieval deficit, Decreased recall of new information, Decreased short term memory Decreased Short Term Memory: Verbal basic, Functional basic Awareness: Appears intact Behaviors: Perseveration, Confabulation    Extremity Assessment (includes Sensation/Coordination)   Upper Extremity Assessment: RUE deficits/detail RUE Deficits / Details: full AROM, however, she demonstrates significant deficits with proprioception.  She is unable to modulate the amount of force she needs to perform a task.  overshoots and undershoots - ataxia noted  RUE Sensation: decreased light touch, decreased proprioception RUE Coordination: decreased fine motor, decreased gross motor  Lower Extremity Assessment: Defer to PT evaluation RLE  Deficits / Details: at least 3/5 strength knee flex/ext, hip flex/ex, DF/PF. Pt limited by RLE incoordination: + heel-to-shin incoordination, poor proprioceptive control in swing and stance phase of gait RLE Sensation: decreased proprioception, decreased light touch RLE Coordination: decreased gross motor, decreased fine motor     ADLs   Overall ADL's : Needs assistance/impaired Eating/Feeding: Set up Eating/Feeding Details (indicate cue type and reason): frequent spills  Grooming: Wash/dry hands, Wash/dry face, Oral care, Brushing hair, Minimal assistance, Sitting Upper Body Bathing: Moderate assistance, Sitting Lower Body Bathing: Moderate assistance, Sit to/from stand Lower Body Bathing Details (indicate cue type and reason): for balance in sitting and standing  Upper Body Dressing : Moderate assistance, Sitting Lower Body Dressing: Moderate assistance, Sit to/from stand Lower Body Dressing Details (indicate cue type and reason): able to don socks with  up to mod A to sitting balance  Toilet Transfer: Moderate assistance, +2 for physical assistance, +2 for safety/equipment, BSC Toileting- Clothing Manipulation and Hygiene: Maximal assistance, Sit to/from stand Functional mobility during ADLs: Moderate assistance, +2 for safety/equipment, Total assistance     Mobility   Overal bed mobility: Needs Assistance Bed Mobility: Supine to Sit Supine to sit: Min assist, HOB elevated, +2 for safety/equipment General bed mobility comments: Min assist for completing translation of RLE to EOB, placing RUE next to trunk.     Transfers   Overall transfer level: Needs assistance Equipment used: 2 person hand held assist Transfers: Sit to/from Stand Sit to Stand: Mod assist, +2 physical assistance General transfer comment: Mod +2 for power up, steadying, and RLE blocking. R lateral leaning corrected with VCs.     Ambulation / Gait / Stairs / Wheelchair Mobility   Ambulation/Gait Ambulation/Gait  assistance: Mod assist, +2 physical assistance Gait Distance (Feet): 5 Feet Assistive device: 2 person hand held assist Gait Pattern/deviations: Step-to pattern, Decreased step length - right, Decreased weight shift to right, Narrow base of support General Gait Details: Mod +2 for steadying, weight shifting L and R for forward progression  LEs, RLE blocking during RLE stance phase, physically assisting RLE during swing phase especially with fatigue. VC for upright posture as pt with preference for R lateral leaning, placing R foot flat during R stance as pt holds foot in supination. Very incoordinated R swing phase. Gait velocity: decr     Posture / Balance Dynamic Sitting Balance Sitting balance - Comments: posterior leaning with donning socks, requiring min-mod assist to correct Balance Overall balance assessment: Needs assistance Sitting-balance support: No upper extremity supported, Feet supported Sitting balance-Leahy Scale: Fair Sitting balance - Comments: posterior leaning with donning socks, requiring min-mod assist to correct Postural control: Posterior lean Standing balance support: Bilateral upper extremity supported, During functional activity Standing balance-Leahy Scale: Poor Standing balance comment: reliant on external assist, R lateral leaning     Special needs/care consideration Pt. Is on CIWA protocol but has demonstrated no signs of withdrawal and has not required ativan since admission.         Previous Home Environment (from acute therapy documentation) Living Arrangements: Spouse/significant other  Lives With: Spouse, Family Available Help at Discharge: Family, Available 24 hours/day Type of Home: House Home Layout: One level Home Access: Stairs to enter Entrance Stairs-Rails: Right, Left Entrance Stairs-Number of Steps: 5 Bathroom Shower/Tub: Walk-in shower, Tub only Firefighter: Standard Bathroom Accessibility: Yes How Accessible: Accessible via  walker Home Care Services: No Additional Comments: Lives with her spouse, son, brother in law and sister in Social worker.  Pt reports that none of them work during the day, and can be available to assist her as needed    Discharge Living Setting Plans for Discharge Living Setting: Patient's home Type of Home at Discharge: Mobile home Discharge Home Layout: One level Discharge Home Access: Stairs to enter Entrance Stairs-Rails: Can reach both Entrance Stairs-Number of Steps: 5 (5) Discharge Bathroom Shower/Tub: Walk-in shower Discharge Bathroom Toilet: Standard Discharge Bathroom Accessibility: Yes How Accessible: Accessible via walker Does the patient have any problems obtaining your medications?: No   Social/Family/Support Systems Patient Roles: Other (Comment) Contact Information: [phone]  Anticipated Caregiver: [name] (brother in law) Anticipated Caregiver's Contact Information: [phone] Ability/Limitations of Caregiver: none Caregiver Availability: 24/7 Discharge Plan Discussed with Primary Caregiver: Yes Is Caregiver In Agreement with Plan?: Yes Does Caregiver/Family have Issues with Lodging/Transportation while Pt is in Rehab?: No     Goals Patient/Family Goal for Rehab: PT/OT/SLP Min A Expected length of stay: 12-14 days Pt/Family Agrees to Admission and willing to participate: Yes Program Orientation Provided & Reviewed with Pt/Caregiver Including Roles  & Responsibilities: Yes     Decrease burden of Care through IP rehab admission: NA     Possible need for SNF placement upon discharge: not anticipated; pt has great support from husband and brother at DC.    Patient Condition: This patient's condition remains as documented in the consult dated 12/14/19, in which the Rehabilitation Physician determined and documented that the patient's condition is appropriate for intensive rehabilitative care in an inpatient rehabilitation facility. Will admit to inpatient  rehab today.   Preadmission Screen Completed By: Megan Salon with day of admit updates provided by:  Cheri Rous, OT, 12/14/2019 10:32 AM ______________________________________________________________________   Discussed status with Dr. Carlis Abbott on 12/14/19 at 10:31AM and received approval for admission today.   Admission Coordinator: Day of admit updates provided by: Cheri Rous, time 10:31AM/Date 12/14/19             Revision History  Note Details  Author Carlis Abbottaulkar, Drema PryKrutika P, MD File Time 12/14/2019 11:11 AM  Author Type Physician Status Signed  Last Editor Horton Chinaulkar, Krutika P, MD Service Physical Medicine and Rehabilitation

## 2019-12-14 NOTE — Evaluation (Signed)
Speech Language Pathology Assessment and Plan  Patient Details  Name: Melissa Colon MRN: [id number] Date of Birth: 11-27-1960  SLP Diagnosis: Cognitive Impairments;Dysarthria;Aphasia  Rehab Potential: Good ELOS: 2.5-3 weeks    Today's Date: 12/15/2019 SLP Individual Time: 1100-1155 SLP Individual Time Calculation (min): 55 min   Hospital Problem: Principal Problem:   ICH (intracerebral hemorrhage) ([place]) L thalamic HTN HMG  Past Medical History:  Past Medical History:  Diagnosis Date  . Alcohol dependence ([place])   . H/O brain tumor   . Smoker    Past Surgical History:  Past Surgical History:  Procedure Laterality Date  . 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  Per chart review patient lives with spouse, son, brother-in-law and sister-in-law.  Reportedly independent prior to admission.  Presented 12/10/2019 with right side weakness and mild aphasia.  Cranial CT scan showed acute intraparenchymal hemorrhage in the left thalamus measuring 2.5 x 1.3 x 2.1 cm.  Mild surrounding edema.  Old bilateral occipital insults with dystrophic calcification.  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  MRA of the head high-grade proximal left P2 segment stenosis.  No other significant proximal stenosis aneurysm or branch vessel occlusion.  A follow-up MRI showed a stable 2.4 cm acute hemorrhage left thalamus with edema extending into the left cerebral peduncle and posterior limb left internal capsule.  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   Neurology follow-up close monitoring of blood pressure.  Patient was cleared to begin subcutaneous heparin for DVT prophylaxis.  Tolerating a regular diet.  Therapy evaluations completed and patient was admitted for a comprehensive rehab program  12/14/19.  Patient was administered the Cognistat and scored WFL on all subtests with the exception of moderate deficits in short-term recall. Mild deficits in higher-level problem solving were also noted during informal assessment. Patient also demonstrates a mild dysarthria due to imprecise consonants which impacts her overall intelligibility at the sentence level. Throughout a functional conversation, mild dysfluencies were noted with decreased word-finding and intermittent phonemic paraphasias which impacted her overall verbal expression at the sentence level. Patient would benefit from skilled SLP intervention to maximize her speech, language and cognitive functioning prior to discharge.    Skilled Therapeutic Interventions          Administered a cognitive-linguistic evaluation, please see above for details.   SLP Assessment  Patient will need skilled [facility] during CIR admission    Recommendations  Oral Care Recommendations: Oral care BID Recommendations for Other Services: Neuropsych consult Patient destination: Home Follow up Recommendations: Home Health SLP;24 hour supervision/assistance Equipment Recommended: None recommended by SLP    SLP Frequency 3 to 5 out of 7 days   SLP Duration  SLP Intensity  SLP Treatment/Interventions 2.5-3 weeks  Minumum of 1-2 x/day, 30 to 90 minutes  Cognitive remediation/compensation;Internal/external aids;Speech/Language facilitation;Therapeutic Activities;Environmental controls;Cueing hierarchy;Functional tasks;Patient/family education    Pain No/Denies Pain   Prior Functioning Type of Home: House  Lives With: Spouse;Family Available Help at Discharge: Family;Available 24 hours/day  SLP Evaluation Cognition Overall Cognitive Status: Impaired/Different from baseline Arousal/Alertness: Awake/alert Orientation Level: Oriented X4 Attention: Sustained Focused Attention: Appears intact Sustained Attention:  Appears intact Memory: Impaired Memory Impairment: Decreased short term memory Awareness: Appears intact Problem Solving: Impaired Problem Solving Impairment: Functional complex Safety/Judgment: Appears intact  Comprehension Auditory Comprehension Overall Auditory Comprehension: Appears  within functional limits for tasks assessed Visual Recognition/Discrimination Discrimination: Not tested Reading Comprehension Reading Status: Not tested Expression Expression Primary Mode of Expression: Verbal Verbal Expression Overall Verbal Expression: Impaired Initiation: No impairment Automatic Speech: Name;Social Response Level of Generative/Spontaneous Verbalization: Phrase;Sentence Repetition: Impaired Level of Impairment: Phrase level Naming: No impairment Responsive: 76-100% accurate Confrontation: Within functional limits Verbal Errors: Phonemic paraphasias Written Expression Dominant Hand: Right Written Expression: Unable to assess (comment) Oral Motor Oral Motor/Sensory Function Overall Oral Motor/Sensory Function: Mild impairment Facial ROM: Reduced right Facial Symmetry: Abnormal symmetry right Facial Strength: Within Functional Limits Facial Sensation: Reduced right Lingual ROM: Reduced right Lingual Symmetry: Abnormal symmetry right Lingual Strength: Reduced Lingual Sensation: Reduced Motor Speech Overall Motor Speech: Impaired Respiration: Within functional limits Phonation: Normal Resonance: Within functional limits Articulation: Impaired Level of Impairment: Sentence Intelligibility: Intelligibility reduced Word: 75-100% accurate Phrase: 75-100% accurate Sentence: 75-100% accurate Conversation: 75-100% accurate Motor Planning: Impaired  Care Tool Care Tool Cognition Expression of Ideas and Wants Expression of Ideas and Wants: Some difficulty - exhibits some difficulty with expressing needs and ideas (e.g, some words or finishing thoughts) or speech is not  clear   Understanding Verbal and Non-Verbal Content Understanding Verbal and Non-Verbal Content: Usually understands - understands most conversations, but misses some part/intent of message. Requires cues at times to understand   Memory/Recall Ability *first 3 days only Memory/Recall Ability *first 3 days only: That he or she is in a hospital/hospital unit;Current season     Short Term Goals: Week 1: SLP Short Term Goal 1 (Week 1): Patient will recall new, daily information with Min verbal cues for use of memory compensatory strategies. SLP Short Term Goal 2 (Week 1): Patient will utilize speech intelligibility strategies at the sentence level ot achieve ~90% intelligibility with supervision verbal cues. SLP Short Term Goal 3 (Week 1): Patient will self-monitor and correct errors during verbal expression with overall supervision level verbal cues. SLP Short Term Goal 4 (Week 1): Patient will demonstrate functional problem solving for mildly complex tasks with Min verbal cues.  Refer to Care Plan for Long Term Goals  Recommendations for other services: Neuropsych  Discharge Criteria: Patient will be discharged from SLP if patient refuses treatment 3 consecutive times without medical reason, if treatment goals not met, if there is a change in medical status, if patient makes no progress towards goals or if patient is discharged from hospital.  The above assessment, treatment plan, treatment alternatives and goals were discussed and mutually agreed upon: by patient  ,  12/15/2019, 1:27 PM

## 2019-12-14 NOTE — H&P (Signed)
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  Per chart review patient lives with spouse, son, brother-in-law and sister-in-law.  1 level home 5 steps to entry.  Reportedly independent prior to admission.  Presented 12/10/2019 with right side weakness and mild aphasia.  Cranial CT scan showed acute intraparenchymal hemorrhage in the left thalamus measuring 2.5 x 1.3 x 2.1 cm.  Mild surrounding edema.  Old bilateral occipital insults with dystrophic calcification.  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  MRA of the head high-grade proximal left P2 segment stenosis.  No other significant proximal stenosis aneurysm or branch vessel occlusion.  A follow-up MRI showed a stable 2.4 cm acute hemorrhage left thalamus with edema extending into the left cerebral peduncle and posterior limb left internal capsule.  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  Admission chemistries alcohol negative sodium 131 hemoglobin 11.6 urine drug screen negative.  Echocardiogram with ejection fraction of 60 to 65% no wall motion abnormalities grade 1 diastolic dysfunction.  Neurology follow-up close monitoring of blood pressure.  Patient was cleared to begin subcutaneous heparin for DVT prophylaxis.  Tolerating a regular diet.  Therapy evaluations completed and patient was admitted for a comprehensive rehab program.  Review of Systems  Constitutional: Negative for chills and fever.  HENT: Negative for hearing loss.   Eyes: Negative for blurred vision and double vision.  Respiratory: Negative for cough and shortness of breath.   Cardiovascular: Negative for chest pain, palpitations and leg swelling.  Gastrointestinal: Positive for constipation. Negative for heartburn, nausea and vomiting.  Genitourinary: Negative for  dysuria, flank pain and hematuria.  Musculoskeletal: Positive for myalgias.  Skin: Negative for rash.  Neurological: Positive for speech change, weakness and headaches.  All other systems reviewed and are negative.  Past Medical History:  Diagnosis Date  . Alcohol dependence (HCC)   . H/O brain tumor   . Smoker    Past Surgical History:  Procedure Laterality Date  . CRANIOTOMY FOR TUMOR     Family History  Problem Relation Age of Onset  . Diabetes Mother    Social History:  reports that she has been smoking cigarettes. She has never used smokeless tobacco. She reports current alcohol use of about 8.0 standard drinks of alcohol per week. She reports previous drug use. Allergies: No Known Allergies Medications Prior to Admission  Medication Sig Dispense Refill  . acetaminophen (TYLENOL) 325 MG tablet Take 2 tablets (650 mg total) by mouth every 4 (four) hours as needed for mild pain (or temp > 37.5 C (99.5 F)).    Marland Kitchen amLODipine (NORVASC) 5 MG tablet Take 1 tablet (5 mg total) by mouth daily.    Marland Kitchen atorvastatin (LIPITOR) 40 MG tablet Take 1 tablet (40 mg total) by mouth daily. 30 tablet 11  . folic acid (FOLVITE) 1 MG tablet Take 1 tablet (1 mg total) by mouth daily.    . Multiple Vitamin (MULTIVITAMIN WITH MINERALS) TABS tablet Take 1 tablet by mouth daily.    . nicotine (NICODERM CQ - DOSED IN MG/24 HOURS) 14 mg/24hr patch Place 1 patch (14 mg total) onto the skin daily. 28 patch 0  . pantoprazole (PROTONIX) 40 MG tablet Take 1 tablet (40 mg total) by mouth daily before breakfast.    . senna-docusate (SENOKOT-S) 8.6-50 MG tablet Take 2 tablets by mouth at bedtime as  needed for moderate constipation.    . thiamine 100 MG tablet Take 1 tablet (100 mg total) by mouth daily.      Drug Regimen Review Drug regimen was reviewed and remains appropriate with no significant issues identified  Home: Home Living Family/patient expects to be discharged to:: Private residence Living  Arrangements: Spouse/significant other Available Help at Discharge: Family, Available 24 hours/day Type of Home: House Home Access: Stairs to enter [employer] of Steps: 5 Entrance Stairs-Rails: Right, Left Home Layout: One level Bathroom Shower/Tub: Walk-in shower, Tub only Firefighter: Standard Bathroom Accessibility: Yes Home Equipment: Gilmer Mor - single point Additional Comments: Lives with her spouse, son, brother in Social worker and sister in Social worker.  Pt reports that none of them work during the day, and can be available to assist her as needed   Lives With: Spouse, Family   Functional History: Prior Function Level of Independence: Independent Comments: Pt reports she was fully independent.  Does not drive, does not work   Functional Status:  Mobility: Bed Mobility Overal bed mobility: Needs Assistance Bed Mobility: Supine to Sit Supine to sit: Min assist, HOB elevated, +2 for safety/equipment General bed mobility comments: Min assist for completing translation of RLE to EOB, placing RUE next to trunk. Transfers Overall transfer level: Needs assistance Equipment used: 2 person hand held assist Transfers: Sit to/from Stand Sit to Stand: Mod assist, +2 physical assistance General transfer comment: Mod +2 for power up, steadying, and RLE blocking. R lateral leaning corrected with VCs. Ambulation/Gait Ambulation/Gait assistance: Mod assist, +2 physical assistance Gait Distance (Feet): 5 Feet Assistive device: 2 person hand held assist Gait Pattern/deviations: Step-to pattern, Decreased step length - right, Decreased weight shift to right, Narrow base of support General Gait Details: Mod +2 for steadying, weight shifting L and R for forward progression LEs, RLE blocking during RLE stance phase, physically assisting RLE during swing phase especially with fatigue. VC for upright posture as pt with preference for R lateral leaning, placing R foot flat during R stance as pt holds  foot in supination. Very incoordinated R swing phase. Gait velocity: decr  ADL: ADL Overall ADL's : Needs assistance/impaired Eating/Feeding: Set up Eating/Feeding Details (indicate cue type and reason): frequent spills  Grooming: Wash/dry hands, Wash/dry face, Oral care, Brushing hair, Minimal assistance, Sitting Upper Body Bathing: Moderate assistance, Sitting Lower Body Bathing: Moderate assistance, Sit to/from stand Lower Body Bathing Details (indicate cue type and reason): for balance in sitting and standing  Upper Body Dressing : Moderate assistance, Sitting Lower Body Dressing: Moderate assistance, Sit to/from stand Lower Body Dressing Details (indicate cue type and reason): able to don socks with  up to mod A to sitting balance  Toilet Transfer: Moderate assistance, +2 for physical assistance, +2 for safety/equipment, BSC Toileting- Clothing Manipulation and Hygiene: Maximal assistance, Sit to/from stand Functional mobility during ADLs: Moderate assistance, +2 for safety/equipment, Total assistance  Cognition: Cognition Overall Cognitive Status: No family/caregiver present to determine baseline cognitive functioning Arousal/Alertness: Awake/alert Orientation Level: Oriented X4 Attention: Sustained Sustained Attention: Impaired Sustained Attention Impairment: Verbal basic, Functional basic Memory: Impaired Memory Impairment: Retrieval deficit, Decreased recall of new information, Decreased short term memory Decreased Short Term Memory: Verbal basic, Functional basic Immediate Memory Recall: Sock, Blue, Bed Memory Recall Sock: With Cue Memory Recall Blue: With Cue Memory Recall Bed: With Cue Awareness: Appears intact Behaviors: Perseveration, Confabulation Cognition Arousal/Alertness: Awake/alert Behavior During Therapy: WFL for tasks assessed/performed, Anxious Overall Cognitive Status: No family/caregiver present to determine baseline cognitive functioning  Area of  Impairment: Orientation, Following commands, Safety/judgement, Problem solving Orientation Level: Disoriented to, Place Following Commands: Follows one step commands with increased time Safety/Judgement: Decreased awareness of safety Problem Solving: Slow processing, Decreased initiation, Requires verbal cues, Requires tactile cues, Difficulty sequencing General Comments: Pt oriented to being in a hospital, not [facility] or [facility]. Pt requires increased time and effort to verbally and physically respond to questions and mobility commands, respectively. Demonstrates decreased responsiveness and command following when up, suspect due to anxiety.  Physical Exam: Blood pressure 118/76, pulse (!) 104, temperature 98.2 F (36.8 C), resp. rate 18, height 5\' 2"  (1.575 m), SpO2 96 %. Physical Exam General: Alert and oriented x 3, No apparent distress HEENT: Head is normocephalic, left gaze preference Neck: Supple without JVD or lymphadenopathy Heart: Reg rate and rhythm. No murmurs rubs or gallops Chest: CTA bilaterally without wheezes, rales, or rhonchi; no distress Abdomen: Soft, non-tender, non-distended, bowel sounds positive. Extremities: No clubbing, cyanosis, or edema. Pulses are 2+ Skin: Clean and intact without signs of breakdown Neuro: Patient is alert in no acute distress.  R facial droop. She made eye contact with examiner and follows commands.  Provides name and age. Slow speech. RUE 3/4 SA, EE, EF, 4-/5 hand grip and WE RLE 3/5 HF, KE, 4/5 DF, PF Decreased sensation throughout right side.  Psych: Pt's affect is appropriate. Pt is cooperative   Results for orders placed or performed during the hospital encounter of 12/10/19 (from the past 48 hour(s))  CBC     Status: Abnormal   Collection Time: 12/13/19  2:55 AM  Result Value Ref Range   WBC 5.7 4.0 - 10.5 K/uL   RBC 3.46 (L) 3.87 - 5.11 MIL/uL   Hemoglobin 10.9 (L) 12.0 - 15.0 g/dL   HCT 16.132.8 (L) 36 - 46 %   MCV 94.8 80.0 - 100.0  fL   MCH 31.5 26.0 - 34.0 pg   MCHC 33.2 30.0 - 36.0 g/dL   RDW 09.614.9 04.511.5 - 40.915.5 %   Platelets 236 150 - 400 K/uL   nRBC 0.0 0.0 - 0.2 %    Comment: Performed at [facility], [address] [place], [place] [address]  Basic metabolic panel     Status: Abnormal   Collection Time: 12/13/19  2:55 AM  Result Value Ref Range   Sodium 138 135 - 145 mmol/L   Potassium 4.3 3.5 - 5.1 mmol/L   Chloride 106 98 - 111 mmol/L   CO2 21 (L) 22 - 32 mmol/L   Glucose, Bld 98 70 - 99 mg/dL    Comment: Glucose reference range applies only to samples taken after fasting for at least 8 hours.   BUN 14 6 - 20 mg/dL   Creatinine, Ser 4.780.59 0.44 - 1.00 mg/dL   Calcium 8.8 (L) 8.9 - 10.3 mg/dL   GFR, Estimated >29>60 >56>60 mL/min    Comment: (NOTE) Calculated using the CKD-EPI Creatinine Equation (2021)    Anion gap 11 5 - 15    Comment: Performed at [facility], [address] [place], [place] [address]  CBC     Status: Abnormal   Collection Time: 12/14/19  4:49 AM  Result Value Ref Range   WBC 5.5 4.0 - 10.5 K/uL   RBC 3.66 (L) 3.87 - 5.11 MIL/uL   Hemoglobin 11.3 (L) 12.0 - 15.0 g/dL   HCT 65.735.1 (L) 36 - 46 %   MCV 95.9 80.0 - 100.0 fL   MCH  30.9 26.0 - 34.0 pg   MCHC 32.2 30.0 - 36.0 g/dL   RDW 24.4 01.0 - 27.2 %   Platelets 262 150 - 400 K/uL   nRBC 0.0 0.0 - 0.2 %    Comment: Performed at [facility], [address], [place], [place] [address]  Basic metabolic panel     Status: Abnormal   Collection Time: 12/14/19  4:49 AM  Result Value Ref Range   Sodium 137 135 - 145 mmol/L   Potassium 3.6 3.5 - 5.1 mmol/L   Chloride 104 98 - 111 mmol/L   CO2 22 22 - 32 mmol/L   Glucose, Bld 91 70 - 99 mg/dL    Comment: Glucose reference range applies only to samples taken after fasting for at least 8 hours.   BUN 13 6 - 20 mg/dL   Creatinine, Ser 4.03 0.44 - 1.00 mg/dL   Calcium 8.8 (L) 8.9 - 10.3 mg/dL   GFR, Estimated >47 >42 mL/min    Comment: (NOTE) Calculated using the CKD-EPI  Creatinine Equation (2021)    Anion gap 11 5 - 15    Comment: Performed at [facility], [address], [place], [place] [address]  Magnesium     Status: None   Collection Time: 12/14/19  4:49 AM  Result Value Ref Range   Magnesium 2.1 1.7 - 2.4 mg/dL    Comment: Performed at [facility], [address] [place], [place] [address]   No results found.     Medical Problem List and Plan: 1.  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  Antithrombotics: -DVT/anticoagulation: Subcutaneous heparin  -antiplatelet therapy: N/A 3. Pain Management: Tylenol as needed 4. Mood: Provide emotional support  -antipsychotic agents: N/A 5. Neuropsych: This patient is capable of making decisions on her own behalf. 6. Skin/Wound Care: Routine skin checks 7. Fluids/Electrolytes/Nutrition: Routine in and outs with follow-up chemistries 8.  Hypertension.  Norvasc 5 mg daily.  Monitor with increased mobility. Well controlled. 9.  Tobacco abuse.  NicoDerm patch.  Provide counseling 10.  History of alcohol use.  Provide counseling. Continue folic acid supplement 11. Tachycardic to 103 on 11/29- monitor TID.   Mcarthur Rossetti Angiulli, PA-C 12/14/2019   I have personally performed a face to face diagnostic evaluation, including, but not limited to relevant history and physical exam findings, of this patient and developed relevant assessment and plan.  Additionally, I have reviewed and concur with the physician assistant's documentation above.  Sula Soda, MD

## 2019-12-14 NOTE — Progress Notes (Addendum)
Inpatient Rehabilitation Medication Review by a Pharmacist  A complete drug regimen review was completed for this patient to identify any potential clinically significant medication issues.  Clinically significant medication issues were identified:  Yes   Type of Medication Issue Identified Description of Issue Urgent (address now) Non-Urgent (address on AM team rounds) Plan Plan Accepted by Provider? (Yes / No / Pending AM Rounds)  Drug Interaction(s) (clinically significant)       Duplicate Therapy  Heparin 5000 units SQ Q 12 hrs and heparin 5000 units SQ Q 8 hrs are both ordered   Duplicate PRNs (Senna S, sorbital) for moderate constipation Urgent       Non urgent Contact Dan Angiulli, PA, to d/c one of the heparin orders and one of duplicate PRNs for moderate constipation Heparin order from acute (5000 units SQ Q 12 hrs) will be CIR heparin regimen, per Deatra Ina, PA; Senna S order PRN moderate constipation d/c'd by Deatra Ina  Allergy       No Medication Administration End Date       Incorrect Dose       Additional Drug Therapy Needed  [facility] discharge summary listed atorvastatin 40 mg daily; med not ordered on CIR admission Non urgent Contact Deatra Ina, PA, to suggest adding atorvastatin, as recommended by discharging provider Yes  Other         Name of provider notified for urgent issues identified:  Deatra Ina, PA  Provider Method of Notification:  Telephone  For non-urgent medication issues to be resolved on team rounds tomorrow morning a [facility] Secure Chat Handoff was sent to: N/A (spoke with Deatra Ina by phone)  Time spent performing this drug regimen review (minutes):  20 mins  Vicki Mallet, PharmD, BCPS, [facility] Clinical Pharmacist 12/14/2019 2:08 PM

## 2019-12-14 NOTE — Progress Notes (Signed)
Inpatient Rehabilitation-Admissions Coordinator   Notified pt of bed offer in CIR today and she has accepted. Reviewed cost of care due to being uninsured and she is aware. She requested I review consent forms and insurance benefit letter with her husband Vonna Kotyk. I spoke with him and he provided consent. Received medical clearance from Dr. Nelson Chimes for admit to CIR today. RN and [facility] team aware of plan.   Please call if questions.   Cheri Rous, OTR/L  Rehab Admissions Coordinator  [phone] 12/14/2019 10:27 AM

## 2019-12-14 NOTE — H&P (Signed)
Physical Medicine and Rehabilitation Admission H&P    Chief Complaint  Patient presents with  . 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  Per chart review patient lives with spouse, son, brother-in-law and sister-in-law.  1 level home 5 steps to entry.  Reportedly independent prior to admission.  Presented 12/10/2019 with right side weakness and mild aphasia.  Cranial CT scan showed acute intraparenchymal hemorrhage in the left thalamus measuring 2.5 x 1.3 x 2.1 cm.  Mild surrounding edema.  Old bilateral occipital insults with dystrophic calcification.  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  MRA of the head high-grade proximal left P2 segment stenosis.  No other significant proximal stenosis aneurysm or branch vessel occlusion.  A follow-up MRI showed a stable 2.4 cm acute hemorrhage left thalamus with edema extending into the left cerebral peduncle and posterior limb left internal capsule.  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  Admission chemistries alcohol negative sodium 131 hemoglobin 11.6 urine drug screen negative.  Echocardiogram with ejection fraction of 60 to 65% no wall motion abnormalities grade 1 diastolic dysfunction.  Neurology follow-up close monitoring of blood pressure.  Patient was cleared to begin subcutaneous heparin for DVT prophylaxis.  Tolerating a regular diet.  Therapy evaluations completed and patient was admitted for a comprehensive rehab program.  Review of Systems  Constitutional: Negative for chills and fever.  HENT: Negative for hearing loss.   Eyes: Negative for blurred vision and double vision.  Respiratory: Negative for cough and shortness of breath.   Cardiovascular: Negative for chest pain, palpitations and leg swelling.  Gastrointestinal: Positive for constipation. Negative for heartburn,  nausea and vomiting.  Genitourinary: Negative for dysuria, flank pain and hematuria.  Musculoskeletal: Positive for myalgias.  Skin: Negative for rash.  Neurological: Positive for speech change, weakness and headaches.  All other systems reviewed and are negative.  Past Medical History:  Diagnosis Date  . Alcohol dependence (HCC)   . H/O brain tumor   . Smoker    Past Surgical History:  Procedure Laterality Date  . CRANIOTOMY FOR TUMOR     Family History  Problem Relation Age of Onset  . Diabetes Mother    Social History:  reports that she has been smoking. She has never used smokeless tobacco. She reports current alcohol use of about 8.0 standard drinks of alcohol per week. No history on file for drug use. Allergies: No Known Allergies Medications Prior to Admission  Medication Sig Dispense Refill  . naproxen sodium (ALEVE) 220 MG tablet Take 220-440 mg by mouth 2 (two) times daily as needed (for pain or headaches).       Drug Regimen Review Drug regimen was reviewed and remains appropriate with no significant issues identified  Home: Home Living Family/patient expects to be discharged to:: Private residence Living Arrangements: Spouse/significant other Available Help at Discharge: Family, Available 24 hours/day Type of Home: House Home Access: Stairs to enter [employer] of Steps: 5 Entrance Stairs-Rails: Right, Left Home Layout: One level Bathroom Shower/Tub: Walk-in shower, Tub only Firefighter: Standard Bathroom Accessibility: Yes Home Equipment: Gilmer Mor - single point Additional Comments: Lives with her spouse, son, brother in Social worker and sister in Social worker.  Pt reports that none of them work during the day, and can be available to assist her as needed   Lives With: Spouse, Family   Functional History: Prior Function Level  of Independence: Independent Comments: Pt reports she was fully independent.  Does not drive, does not work   Functional Status:   Mobility: Bed Mobility Overal bed mobility: Needs Assistance Bed Mobility: Supine to Sit Supine to sit: Min assist, HOB elevated, +2 for safety/equipment General bed mobility comments: Min assist for completing translation of RLE to EOB, placing RUE next to trunk. Transfers Overall transfer level: Needs assistance Equipment used: 2 person hand held assist Transfers: Sit to/from Stand Sit to Stand: Mod assist, +2 physical assistance General transfer comment: Mod +2 for power up, steadying, and RLE blocking. R lateral leaning corrected with VCs. Ambulation/Gait Ambulation/Gait assistance: Mod assist, +2 physical assistance Gait Distance (Feet): 5 Feet Assistive device: 2 person hand held assist Gait Pattern/deviations: Step-to pattern, Decreased step length - right, Decreased weight shift to right, Narrow base of support General Gait Details: Mod +2 for steadying, weight shifting L and R for forward progression LEs, RLE blocking during RLE stance phase, physically assisting RLE during swing phase especially with fatigue. VC for upright posture as pt with preference for R lateral leaning, placing R foot flat during R stance as pt holds foot in supination. Very incoordinated R swing phase. Gait velocity: decr    ADL: ADL Overall ADL's : Needs assistance/impaired Eating/Feeding: Set up Eating/Feeding Details (indicate cue type and reason): frequent spills  Grooming: Wash/dry hands, Wash/dry face, Oral care, Brushing hair, Minimal assistance, Sitting Upper Body Bathing: Moderate assistance, Sitting Lower Body Bathing: Moderate assistance, Sit to/from stand Lower Body Bathing Details (indicate cue type and reason): for balance in sitting and standing  Upper Body Dressing : Moderate assistance, Sitting Lower Body Dressing: Moderate assistance, Sit to/from stand Lower Body Dressing Details (indicate cue type and reason): able to don socks with  up to mod A to sitting balance  Toilet  Transfer: Moderate assistance, +2 for physical assistance, +2 for safety/equipment, BSC Toileting- Clothing Manipulation and Hygiene: Maximal assistance, Sit to/from stand Functional mobility during ADLs: Moderate assistance, +2 for safety/equipment, Total assistance  Cognition: Cognition Overall Cognitive Status: No family/caregiver present to determine baseline cognitive functioning Arousal/Alertness: Awake/alert Orientation Level: Oriented X4 Attention: Sustained Sustained Attention: Impaired Sustained Attention Impairment: Verbal basic, Functional basic Memory: Impaired Memory Impairment: Retrieval deficit, Decreased recall of new information, Decreased short term memory Decreased Short Term Memory: Verbal basic, Functional basic Immediate Memory Recall: Sock, Blue, Bed Memory Recall Sock: With Cue Memory Recall Blue: With Cue Memory Recall Bed: With Cue Awareness: Appears intact Behaviors: Perseveration, Confabulation Cognition Arousal/Alertness: Awake/alert Behavior During Therapy: WFL for tasks assessed/performed, Anxious Overall Cognitive Status: No family/caregiver present to determine baseline cognitive functioning Area of Impairment: Orientation, Following commands, Safety/judgement, Problem solving Orientation Level: Disoriented to, Place Following Commands: Follows one step commands with increased time Safety/Judgement: Decreased awareness of safety Problem Solving: Slow processing, Decreased initiation, Requires verbal cues, Requires tactile cues, Difficulty sequencing General Comments: Pt oriented to being in a hospital, not [facility]  or [facility] Pt requires increased time and effort to verbally and physically respond to questions and mobility commands, respectively. Demonstrates decreased responsiveness and command following when up, suspect due to anxiety.  Physical Exam: Blood pressure 137/76, pulse 90, temperature 99.4 F (37.4 C), temperature source Oral, resp. rate  19, weight 49.9 kg, SpO2 97 %. Physical Exam General: Alert and oriented x 3, No apparent distress HEENT: Head is normocephalic, left gaze preference Neck: Supple without JVD or lymphadenopathy Heart: Reg rate and rhythm. No murmurs rubs or gallops Chest: CTA bilaterally without wheezes, rales,  or rhonchi; no distress Abdomen: Soft, non-tender, non-distended, bowel sounds positive. Extremities: No clubbing, cyanosis, or edema. Pulses are 2+ Skin: Clean and intact without signs of breakdown Neuro: Patient is alert in no acute distress.  R facial droop. She made eye contact with examiner and follows commands.  Provides name and age. Slow speech. RUE 3/4 SA, EE, EF, 4-/5 hand grip and WE RLE 3/5 HF, KE, 4/5 DF, PF Decreased sensation throughout right side.  Psych: Pt's affect is appropriate. Pt is cooperative   Results for orders placed or performed during the hospital encounter of 12/10/19 (from the past 48 hour(s))  CBC     Status: Abnormal   Collection Time: 12/13/19  2:55 AM  Result Value Ref Range   WBC 5.7 4.0 - 10.5 K/uL   RBC 3.46 (L) 3.87 - 5.11 MIL/uL   Hemoglobin 10.9 (L) 12.0 - 15.0 g/dL   HCT 16.132.8 (L) 36 - 46 %   MCV 94.8 80.0 - 100.0 fL   MCH 31.5 26.0 - 34.0 pg   MCHC 33.2 30.0 - 36.0 g/dL   RDW 09.614.9 04.511.5 - 40.915.5 %   Platelets 236 150 - 400 K/uL   nRBC 0.0 0.0 - 0.2 %    Comment: Performed at [facility], [address] [place], [place] [address]  Basic metabolic panel     Status: Abnormal   Collection Time: 12/13/19  2:55 AM  Result Value Ref Range   Sodium 138 135 - 145 mmol/L   Potassium 4.3 3.5 - 5.1 mmol/L   Chloride 106 98 - 111 mmol/L   CO2 21 (L) 22 - 32 mmol/L   Glucose, Bld 98 70 - 99 mg/dL    Comment: Glucose reference range applies only to samples taken after fasting for at least 8 hours.   BUN 14 6 - 20 mg/dL   Creatinine, Ser 4.780.59 0.44 - 1.00 mg/dL   Calcium 8.8 (L) 8.9 - 10.3 mg/dL   GFR, Estimated >29>60 >56>60 mL/min    Comment:  (NOTE) Calculated using the CKD-EPI Creatinine Equation (2021)    Anion gap 11 5 - 15    Comment: Performed at [facility], [address] [place], [place] [address]  CBC     Status: Abnormal   Collection Time: 12/14/19  4:49 AM  Result Value Ref Range   WBC 5.5 4.0 - 10.5 K/uL   RBC 3.66 (L) 3.87 - 5.11 MIL/uL   Hemoglobin 11.3 (L) 12.0 - 15.0 g/dL   HCT 65.735.1 (L) 36 - 46 %   MCV 95.9 80.0 - 100.0 fL   MCH 30.9 26.0 - 34.0 pg   MCHC 32.2 30.0 - 36.0 g/dL   RDW 84.614.9 96.211.5 - 95.215.5 %   Platelets 262 150 - 400 K/uL   nRBC 0.0 0.0 - 0.2 %    Comment: Performed at [facility], [address] [place], [place] [address]  Basic metabolic panel     Status: Abnormal   Collection Time: 12/14/19  4:49 AM  Result Value Ref Range   Sodium 137 135 - 145 mmol/L   Potassium 3.6 3.5 - 5.1 mmol/L   Chloride 104 98 - 111 mmol/L   CO2 22 22 - 32 mmol/L   Glucose, Bld 91 70 - 99 mg/dL    Comment: Glucose reference range applies only to samples taken after fasting for at least 8 hours.   BUN 13 6 - 20 mg/dL   Creatinine, Ser 4.400.49  0.44 - 1.00 mg/dL   Calcium 8.8 (L) 8.9 - 10.3 mg/dL   GFR, Estimated >01 >77 mL/min    Comment: (NOTE) Calculated using the CKD-EPI Creatinine Equation (2021)    Anion gap 11 5 - 15    Comment: Performed at [facility], [address], [place], [place] [address]  Magnesium     Status: None   Collection Time: 12/14/19  4:49 AM  Result Value Ref Range   Magnesium 2.1 1.7 - 2.4 mg/dL    Comment: Performed at [facility], [address] [place], [place] [address]   No results found.     Medical Problem List and Plan: 1.  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  Antithrombotics: -DVT/anticoagulation: Subcutaneous heparin  -antiplatelet therapy: N/A 3.  Pain Management: Tylenol as needed 4. Mood: Provide emotional support  -antipsychotic agents: N/A 5. Neuropsych: This patient is capable of making decisions on her own behalf. 6. Skin/Wound Care: Routine skin checks 7. Fluids/Electrolytes/Nutrition: Routine in and outs with follow-up chemistries 8.  Hypertension.  Norvasc 5 mg daily.  Monitor with increased mobility. Well controlled. 9.  Tobacco abuse.  NicoDerm patch.  Provide counseling 10.  History of alcohol use.  Provide counseling. Continue folic acid supplement 11. Tachycardic to 103 on 11/29- monitor TID.   Mcarthur Rossetti Angiulli, PA-C 12/14/2019   I have personally performed a face to face diagnostic evaluation, including, but not limited to relevant history and physical exam findings, of this patient and developed relevant assessment and plan.  Additionally, I have reviewed and concur with the physician assistant's documentation above.  Sula Soda, MD

## 2019-12-14 NOTE — Progress Notes (Signed)
Physical Therapy Treatment Patient Details Name: Melissa Colon MRN: [id number] DOB: 02-23-1960 Today's Date: 12/14/2019    History of Present Illness 59 yo female admitted to ED on 11/25 with dense R hemiparesis, Imaging reveals ICH in L thalamus. 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    PT Comments    Focused session primarily on muscle activation and midline orientation in the standing position. Pt does not push or lean drastically to either side in sitting, but when coming to stand and maintaining standing balance she pushes strongly to her R. When attempting to correct the pt physically she would resist weight shift over to L leg and actually lift 1 or both legs off the ground, resulting in mod-maxA to maintain her standing balance and safely return her to sit on EOB. During final standing bouts, placed a mirror anterior to the pt to encourage midline orientation, with noted success. She was able to transfer to stand more in midline with modA and maintain her standing balance for increased periods of time with only minA. Educated pt on necessity to retrain brain and on pushing syndrome with pt's with strokes. Pt would greatly benefit from intensive therapy sessions in the CIR setting to maximize her independence and safety with functional mobility. Will continue to follow acutely.  Follow Up Recommendations  CIR     Equipment Recommendations  3in1 (PT);Wheelchair (measurements PT);Wheelchair cushion (measurements PT) (R arm platform walker)    Recommendations for Other Services       Precautions / Restrictions Precautions Precautions: Fall Precaution Comments: R hemiparesis and incoordination, pushing to R in stand Restrictions Weight Bearing Restrictions: No    Mobility  Bed Mobility Overal bed mobility: Needs Assistance Bed Mobility: Supine to Sit;Sit to Supine     Supine to sit: HOB elevated;Min assist Sit to supine: Min  assist   General bed mobility comments: MinA to manage legs with bed mob and cues to transition to R elbow > hand with use of L to push up off bed to ascend trunk.  Transfers Overall transfer level: Needs assistance Equipment used: 1 person hand held assist;Rolling walker (2 wheeled) Transfers: Sit to/from Stand Sit to Stand: Mod assist         General transfer comment: Pt's R elbow resting on therapist's arm to allow push up to stand with L hand on bed and transitioning to therapist or RW placed to L of pt. ModA and R knee block provided to power up to stand. Improved midline positioning during transfer with mirror anterior to pt.  Ambulation/Gait             General Gait Details: Did not ambulate this date   Stairs             Wheelchair Mobility    Modified Rankin (Stroke Patients Only) Modified Rankin (Stroke Patients Only) Pre-Morbid Rankin Score: No symptoms Modified Rankin: Moderately severe disability     Balance Overall balance assessment: Needs assistance Sitting-balance support: No upper extremity supported;Feet supported Sitting balance-Leahy Scale: Fair Sitting balance - Comments: No LOB sitting statically EOB, supervision for safety, Postural control: Right lateral lean Standing balance support: Bilateral upper extremity supported Standing balance-Leahy Scale: Poor Standing balance comment: During first 3 standing bouts pt pushed strongly to R despite max cues to lean to L. Any time L lateral lean was encouraged she would resist and lift 1 or both legs, requiring mod-maxA to maintain balance or return to sit due to lifting  of legs and LOB. During final 2 standing bouts mirror was anterior to pt and pt was able to maintain midline posture with minA and stand for increased periods of time of ~30 sec each bout with R knee block intermittently.                            Cognition Arousal/Alertness: Awake/alert Behavior During Therapy: WFL  for tasks assessed/performed;Anxious Overall Cognitive Status: No family/caregiver present to determine baseline cognitive functioning Area of Impairment: Orientation;Following commands;Safety/judgement;Awareness;Problem solving                 Orientation Level:  (several attempts to identify correct year)     Following Commands: Follows one step commands consistently;Follows one step commands with increased time Safety/Judgement: Decreased awareness of safety;Decreased awareness of deficits Awareness: Emergent Problem Solving: Slow processing;Decreased initiation;Requires verbal cues;Requires tactile cues;Difficulty sequencing General Comments: Pt A&Ox4 but required mutliple attempts to identify correct year. Pt required cues and education to acknowledge she was pushing to R in stand, placing her at risk for falls.      Exercises      General Comments        Pertinent Vitals/Pain Pain Assessment: Faces Faces Pain Scale: Hurts a little bit Pain Location: R leg and arm Pain Descriptors / Indicators: Discomfort;Grimacing Pain Intervention(s): Limited activity within patient's tolerance;Monitored during session;Repositioned    Home Living                      Prior Function            PT Goals (current goals can now be found in the care plan section) Acute Rehab PT Goals Patient Stated Goal: get better PT Goal Formulation: With patient Time For Goal Achievement: 12/25/19 Potential to Achieve Goals: Good Progress towards PT goals: Progressing toward goals    Frequency    Min 4X/week      PT Plan Current plan remains appropriate    Co-evaluation              AM-PAC PT "6 Clicks" Mobility   Outcome Measure  Help needed turning from your back to your side while in a flat bed without using bedrails?: A Little Help needed moving from lying on your back to sitting on the side of a flat bed without using bedrails?: A Little Help needed moving to  and from a bed to a chair (including a wheelchair)?: A Lot Help needed standing up from a chair using your arms (e.g., wheelchair or bedside chair)?: A Lot Help needed to walk in hospital room?: A Lot Help needed climbing 3-5 steps with a railing? : Total 6 Click Score: 13    End of Session Equipment Utilized During Treatment: Gait belt Activity Tolerance: Patient tolerated treatment well Patient left: in bed;with call bell/phone within reach;with bed alarm set   PT Visit Diagnosis: Other abnormalities of gait and mobility (R26.89);Hemiplegia and hemiparesis;Unsteadiness on feet (R26.81);Muscle weakness (generalized) (M62.81);Difficulty in walking, not elsewhere classified (R26.2);Other symptoms and signs involving the nervous system (R29.898) Hemiplegia - Right/Left: Right Hemiplegia - dominant/non-dominant: Dominant Hemiplegia - caused by: Nontraumatic intracerebral hemorrhage     Time: 1142-1208 PT Time Calculation (min) (ACUTE ONLY): 26 min  Charges:  $Neuromuscular Re-education: 23-37 mins                     Raymond Gurney, PT, DPT Acute Rehabilitation Services  Pager: [phone] Office:  [phone]    Henrene Dodge Pettis 12/14/2019, 12:19 PM

## 2019-12-14 NOTE — TOC Transition Note (Signed)
Transition of Care [facility]) - CM/SW Discharge Note   Patient Details  Name: Mehgan Santmyer MRN: [id number] Date of Birth: 1961/01/15  Transition of Care [facility]) CM/SW Contact:  Kermit Balo, RN Phone Number: 12/14/2019, 11:14 AM   Clinical Narrative:    Pt discharging to CIR today. CM signing off.   Final next level of care: IP Rehab Facility Barriers to Discharge: Inadequate or no insurance, Barriers Unresolved (comment)   Patient Goals and CMS Choice   CMS Medicare.gov Compare Post Acute Care list provided to:: Patient Choice offered to / list presented to : Patient  Discharge Placement                       Discharge Plan and Services   Discharge Planning Services: CM Consult Post Acute Care Choice: IP Rehab                               Social Determinants of Health (SDOH) Interventions     Readmission Risk Interventions No flowsheet data found.

## 2019-12-14 NOTE — Progress Notes (Signed)
Pt. Admitted to 4W01. Oriented to floor, rehab schedule, reviewed medication and plan of care. Denies pain or discomfort at this time.   Marny Lowenstein, RN

## 2019-12-14 NOTE — Discharge Summary (Signed)
Physician Discharge Summary  Melissa Colon JYN:[id number] DOB: Jun 18, 1960 DOA: 12/10/2019  PCP: Pcp, No  Admit date: 12/10/2019 Discharge date: 12/14/2019  Admitted From: Home Disposition: CIR  Recommendations for Outpatient Follow-up:  1. Follow up with PCP in 1-2 weeks 2. Please obtain BMP/CBC in one week your next doctors visit.  3. Follow-up outpatient neurology in 3-4 weeks 4. Counseled to quit using alcohol.  Continue taking multivitamin, folic acid and thiamine daily 5. Nicotine patch prescribed 6. Norvasc 5 mg daily, atorvastatin 40 mg daily prescribed 7. Needs to have daily 1-2 soft bowel movements.  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  CT head showed left-sided thalamic stroke confirmed on MRI brain.  Repeat CT head was stable.  MRI showed high-grade P2 stenosis.  Echocardiogram EF 60 to 65%, LDL 92, A1c 5.2, carotid Dopplers unremarkable.  PT recommended CIR therefore awaiting placement.    Lipitor 40 mg daily prescribed at discharge. Her brother has been updated by me yesterday regarding her hospital stay and care plan.  Assessment & Plan:   Principal Problem:   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 Active Problems:   Hypertensive emergency   Alcohol dependence (HCC)   Tobacco use disorder   Left-sided thalamic stroke -CT head showed left-sided thalamic stroke confirmed on MRI brain.  Repeat CT head was stable.  MRI showed high-grade P2 stenosis.  Echocardiogram EF 60 to 65%, LDL 92, A1c 5.2, carotid Dopplers unremarkable.  PT recommended CIR therefore awaiting placement.  Possible statin at discharge. -Plans to  transfer patient to CIR, hopefully today  Essential hypertension -No prior history.    Now on daily 5 mg Norvasc.  2 g salt diet  Hyperlipidemia -LDL 92, Lipitor 40 mg daily at discharge  Alcohol abuse -Closely monitor for any signs of withdrawal.  Counseled to quit using alcohol. 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         Discharge Diagnoses:  Principal Problem:   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 Active Problems:   Hypertensive emergency   Alcohol dependence (HCC)   Tobacco use disorder     Subjective: Feels okay no complaints.  No acute events overnight.  Discharge Exam: Vitals:   12/14/19 0012 12/14/19 0405  BP: 123/73 133/72  Pulse: 70 80  Resp:  20  Temp: 97.9 F (36.6 C) 98 F (36.7 C)  SpO2: 100% 100%   Vitals:   12/13/19 1611 12/13/19 2040 12/14/19 0012 12/14/19 0405  BP: 123/75 116/74 123/73 133/72  Pulse: 87 80 70 80  Resp: 18 20  20   Temp: 98.1 F (36.7 C) 97.8 F (36.6 C) 97.9 F (36.6 C) 98 F (36.7 C)  TempSrc: Oral Oral Oral Oral  SpO2: 97% 97% 100% 100%  Weight:        General: Pt is alert, awake, not in acute distress Cardiovascular: RRR, S1/S2 +, no rubs, no gallops Respiratory: CTA bilaterally, no wheezing, no rhonchi Abdominal: Soft, NT, ND, bowel sounds + Extremities: no edema, no cyanosis Still having right upper and lower extremity weakness strength is  3/5 in both right and lower extremity.  Left upper and lower extremity strength 4+/5  Discharge Instructions  Discharge Instructions    Ambulatory referral to Neurology   Complete by: As directed    Follow up with stroke clinic NP (Jessica Vanschaick or Darrol Angel, if both not available, consider Manson Allan, or [facility]) at [facility] in about 4 weeks. Thanks.     Allergies as of 12/14/2019   No Known Allergies      Medication List    STOP taking these medications   naproxen sodium 220 MG tablet Commonly known as: ALEVE     TAKE these medications   acetaminophen 325 MG tablet Commonly known as: TYLENOL Take 2 tablets (650 mg total) by mouth every 4 (four) hours as needed for mild pain (or temp > 37.5 C (99.5 F)).   amLODipine 5 MG tablet Commonly known as: NORVASC Take 1 tablet (5 mg total) by mouth daily.   atorvastatin 40 MG tablet Commonly known as: Lipitor Take 1 tablet (40 mg total) by mouth daily.   folic acid 1 MG tablet Commonly known as: FOLVITE Take 1 tablet (1 mg total) by mouth daily.   multivitamin with minerals Tabs tablet Take 1 tablet by mouth daily.   nicotine 14 mg/24hr patch Commonly known as: NICODERM CQ - dosed in mg/24 hours Place 1 patch (14 mg total) onto the skin daily.   pantoprazole 40 MG tablet Commonly known as: PROTONIX Take 1 tablet (40 mg total) by mouth daily before breakfast.   senna-docusate 8.6-50 MG tablet Commonly known as: Senokot-S Take 2 tablets by mouth at bedtime as needed for moderate constipation.   thiamine 100 MG tablet Take 1 tablet (100 mg total) by mouth daily.       Follow-up Information    [facility]. Schedule an appointment as soon as possible for a visit in 4 week(s).   Specialty: Neurology Contact information: [address] [place] [address] [phone]             No Known Allergies  You were cared for by a hospitalist during your hospital stay. If you have any questions about your discharge medications or the care you received while you were in the hospital after you are discharged, you can call the unit and asked to speak with the hospitalist on call if the hospitalist that took care of you is not available. Once you are discharged, your primary care physician will handle any further medical issues. Please note that no refills for any discharge medications will  be authorized once you are discharged, as it is imperative that you return to your primary care physician (or establish a relationship with a primary care physician if you do not have one) for your aftercare needs so that they can reassess your need for medications and monitor your lab values.   Procedures/Studies: CT HEAD WO CONTRAST  Result Date: 12/10/2019 CLINICAL DATA:  Intracranial hemorrhage.  Headache. EXAM: CT HEAD WITHOUT CONTRAST TECHNIQUE: Contiguous axial images were obtained from the base of the skull through the vertex without intravenous contrast. COMPARISON:  CT head without contrast 12/10/2019. MR head without contrast 12/10/2019 FINDINGS: Brain: Left thalamic hemorrhage is stable, measuring 2.4 cm maximally. Surrounding edema is as expected. No new hemorrhage or expansion is present. Moderate atrophy and white matter changes are present. Remote infarct of the left parietal lobe is again noted. Postsurgical encephalomalacia is present in the right cerebellum. Left cerebellar calcifications are stable.  The ventricles are proportionate to the degree of atrophy. No significant extraaxial fluid collection is present. Vascular: Atherosclerotic calcifications are present within the cavernous internal carotid arteries. No hyperdense vessel is present. Skull: Right occipital craniotomy is noted. Left parietal burr hole noted. Calvarium otherwise intact. No significant extracranial soft tissue lesion is present. Sinuses/Orbits: The paranasal sinuses and mastoid air cells are clear. The globes and orbits are within normal limits. IMPRESSION: 1. Stable left thalamic hemorrhage. 2. No new hemorrhage or expansion. 3. Stable atrophy and white matter disease. 4. Remote infarct of the left parietal lobe. 5. Postsurgical encephalomalacia in the right cerebellum. Electronically Signed   By: Marin Roberts M.D.   On: 12/10/2019 21:23   MR MRA HEAD WO CONTRAST  Result Date: 12/10/2019 CLINICAL DATA:   Code stroke. Neurologic deficit. Cerebral hemorrhage. Right hemiparesis. Left gaze preference. EXAM: MRI HEAD WITHOUT CONTRAST MRA HEAD WITHOUT CONTRAST TECHNIQUE: Multiplanar, multiecho pulse sequences of the brain and surrounding structures were obtained without intravenous contrast. Angiographic images of the head were obtained using MRA technique without contrast. COMPARISON:  None. CT head without contrast 12/10/2019 FINDINGS: MRI HEAD FINDINGS Brain: Acute hemorrhage in the left thalamus measuring 2.4 cm maximally is stable. Surrounding edema extends into the left cerebral peduncle and superiorly into the posterior limb left internal capsule. There is some focal mass effect. No additional hemorrhage is evident. Mass lesion is evident. Moderate generalized atrophy is present. Periventricular and subcortical T2 hyperintensities are present bilaterally. White matter changes extend into the brainstem. Chronic encephalomalacia is noted in the right cerebellum with prior craniotomy. Calcifications are again noted at the undersurface of the left cerebellum. The ventricles are proportionate to the degree of atrophy. No significant extraaxial fluid collection is present. Internal auditory canals are unremarkable. Vascular: Flow is present in the major intracranial arteries. Skull and upper cervical spine: Marrow spaces demonstrate extensive T1 shortening. Question prior radiation. Craniocervical junction is otherwise normal. Sinuses/Orbits: Mild mucosal thickening is present throughout the anterior paranasal sinuses. Fluid is present in the mastoid air cells bilaterally, left greater than right. The globes and orbits are within normal limits. MRA HEAD FINDINGS Internal carotid minimal atherosclerotic irregularity is present within arteries bilaterally the cavernous. 1.5 mm posterior communicating artery aneurysms are noted bilaterally. ICA termini are otherwise normal. The A1 and M1 segments are within normal limits.  The anterior communicating artery is patent. ACA and MCA branch vessels are within normal limits. The right vertebral artery is dominant. Left V4 segment is hypoplastic. PICA origin is noted on the left. Basilar artery is within limits. High-grade proximal left P2 segment stenosis is present. Distal left PCA branch vessels are not visualized. Mild distal atherosclerotic changes are present in the right PCA branches which are otherwise intact. IMPRESSION: 1. Stable 2.4 cm acute hemorrhage in the left thalamus with edema extending into the left cerebral peduncle and posterior limb left internal capsule. 2. Moderate generalized atrophy and white matter disease likely reflects the sequela of chronic microvascular ischemia. 3. 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 4. High-grade proximal left P2 segment stenosis. This could correlate with the thalamic hemorrhage. No infarct present in the more distal left PCA territory. 5. No other significant proximal stenosis, aneurysm, or branch vessel occlusion within the Circle of Willis. No vascular malformation. Electronically Signed   By: Marin Roberts M.D.   On: 12/10/2019 18:30   MR BRAIN WO CONTRAST  Result Date: 12/10/2019 CLINICAL DATA:  Code stroke. Neurologic deficit. Cerebral hemorrhage. Right hemiparesis. Left gaze  preference. EXAM: MRI HEAD WITHOUT CONTRAST MRA HEAD WITHOUT CONTRAST TECHNIQUE: Multiplanar, multiecho pulse sequences of the brain and surrounding structures were obtained without intravenous contrast. Angiographic images of the head were obtained using MRA technique without contrast. COMPARISON:  None. CT head without contrast 12/10/2019 FINDINGS: MRI HEAD FINDINGS Brain: Acute hemorrhage in the left thalamus measuring 2.4 cm maximally is stable. Surrounding edema extends into the left cerebral peduncle and superiorly into the posterior limb left internal capsule. There is some focal mass effect. No additional hemorrhage is  evident. Mass lesion is evident. Moderate generalized atrophy is present. Periventricular and subcortical T2 hyperintensities are present bilaterally. White matter changes extend into the brainstem. Chronic encephalomalacia is noted in the right cerebellum with prior craniotomy. Calcifications are again noted at the undersurface of the left cerebellum. The ventricles are proportionate to the degree of atrophy. No significant extraaxial fluid collection is present. Internal auditory canals are unremarkable. Vascular: Flow is present in the major intracranial arteries. Skull and upper cervical spine: Marrow spaces demonstrate extensive T1 shortening. Question prior radiation. Craniocervical junction is otherwise normal. Sinuses/Orbits: Mild mucosal thickening is present throughout the anterior paranasal sinuses. Fluid is present in the mastoid air cells bilaterally, left greater than right. The globes and orbits are within normal limits. MRA HEAD FINDINGS Internal carotid minimal atherosclerotic irregularity is present within arteries bilaterally the cavernous. 1.5 mm posterior communicating artery aneurysms are noted bilaterally. ICA termini are otherwise normal. The A1 and M1 segments are within normal limits. The anterior communicating artery is patent. ACA and MCA branch vessels are within normal limits. The right vertebral artery is dominant. Left V4 segment is hypoplastic. PICA origin is noted on the left. Basilar artery is within limits. High-grade proximal left P2 segment stenosis is present. Distal left PCA branch vessels are not visualized. Mild distal atherosclerotic changes are present in the right PCA branches which are otherwise intact. IMPRESSION: 1. Stable 2.4 cm acute hemorrhage in the left thalamus with edema extending into the left cerebral peduncle and posterior limb left internal capsule. 2. Moderate generalized atrophy and white matter disease likely reflects the sequela of chronic microvascular  ischemia. 3. 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 4. High-grade proximal left P2 segment stenosis. This could correlate with the thalamic hemorrhage. No infarct present in the more distal left PCA territory. 5. No other significant proximal stenosis, aneurysm, or branch vessel occlusion within the Circle of Willis. No vascular malformation. Electronically Signed   By: Marin Robertshristopher  Mattern M.D.   On: 12/10/2019 18:30   ECHOCARDIOGRAM COMPLETE  Result Date: 12/11/2019    ECHOCARDIOGRAM REPORT   Patient Name:   Melissa Colon Date of Exam: 12/11/2019 Medical Rec #:  [id number]         Height: Accession #:    [id number]        Weight:       110.0 lb Date of Birth:  1960-10-21         BSA:          1.356 m Patient Age:    59 years          BP:           140/83 mmHg Patient Gender: F                 HR:           79 bpm. Exam Location:  Inpatient Procedure: 2D Echo, Cardiac Doppler and Color Doppler Indications:    CVA  History:  Patient has no prior history of Echocardiogram examinations.                 Stroke; Risk Factors:Current Smoker. ETOH dependencce.  Sonographer:    Lavenia Atlas Referring Phys: [id number] [facility]  Sonographer Comments: Image acquisition challenging due to respiratory motion. IMPRESSIONS  1. Left ventricular ejection fraction, by estimation, is 60 to 65%. The left ventricle has normal function. The left ventricle has no regional wall motion abnormalities. Left ventricular diastolic parameters are consistent with Grade I diastolic dysfunction (impaired relaxation).  2. Right ventricular systolic function is normal. The right ventricular size is normal. There is normal pulmonary artery systolic pressure.  3. The mitral valve is normal in structure. Mild mitral valve regurgitation. No evidence of mitral stenosis.  4. The aortic valve is normal in structure. Aortic valve regurgitation is not visualized. No aortic stenosis is present.  5. The inferior  vena cava is normal in size with greater than 50% respiratory variability, suggesting right atrial pressure of 3 mmHg. FINDINGS  Left Ventricle: Left ventricular ejection fraction, by estimation, is 60 to 65%. The left ventricle has normal function. The left ventricle has no regional wall motion abnormalities. The left ventricular internal cavity size was normal in size. There is  no left ventricular hypertrophy. Left ventricular diastolic parameters are consistent with Grade I diastolic dysfunction (impaired relaxation). Normal left ventricular filling pressure. Right Ventricle: The right ventricular size is normal. No increase in right ventricular wall thickness. Right ventricular systolic function is normal. There is normal pulmonary artery systolic pressure. The tricuspid regurgitant velocity is 2.47 m/s, and  with an assumed right atrial pressure of 3 mmHg, the estimated right ventricular systolic pressure is 27.4 mmHg. Left Atrium: Left atrial size was normal in size. Right Atrium: Right atrial size was normal in size. Pericardium: There is no evidence of pericardial effusion. Mitral Valve: The mitral valve is normal in structure. Mild mitral valve regurgitation, with centrally-directed jet. No evidence of mitral valve stenosis. Tricuspid Valve: The tricuspid valve is normal in structure. Tricuspid valve regurgitation is trivial. No evidence of tricuspid stenosis. Aortic Valve: The aortic valve is normal in structure. Aortic valve regurgitation is not visualized. No aortic stenosis is present. Pulmonic Valve: The pulmonic valve was normal in structure. Pulmonic valve regurgitation is not visualized. No evidence of pulmonic stenosis. Aorta: The aortic root is normal in size and structure. Venous: The inferior vena cava is normal in size with greater than 50% respiratory variability, suggesting right atrial pressure of 3 mmHg. IAS/Shunts: No atrial level shunt detected by color flow Doppler.  LEFT VENTRICLE PLAX  2D LVIDd:         3.70 cm  Diastology LVIDs:         2.50 cm  LV e' medial:    5.66 cm/s LV PW:         1.00 cm  LV E/e' medial:  11.7 LV IVS:        0.90 cm  LV e' lateral:   9.03 cm/s LVOT diam:     2.10 cm  LV E/e' lateral: 7.3 LV SV:         88 LV SV Index:   65 LVOT Area:     3.46 cm  RIGHT VENTRICLE RV Basal diam:  2.60 cm RV S prime:     7.94 cm/s TAPSE (M-mode): 2.4 cm LEFT ATRIUM             Index  RIGHT ATRIUM          Index LA diam:        2.40 cm 1.77 cm/m  RA Area:     9.05 cm LA Vol (A2C):   24.4 ml 18.00 ml/m RA Volume:   17.10 ml 12.61 ml/m LA Vol (A4C):   19.3 ml 14.24 ml/m LA Biplane Vol: 22.1 ml 16.30 ml/m  AORTIC VALVE LVOT Vmax:   102.00 cm/s LVOT Vmean:  72.900 cm/s LVOT VTI:    0.253 m  AORTA Ao Root diam: 2.90 cm MITRAL VALVE               TRICUSPID VALVE MV Area (PHT): 2.56 cm    TR Peak grad:   24.4 mmHg MV Decel Time: 296 msec    TR Vmax:        247.00 cm/s MV E velocity: 66.00 cm/s MV A velocity: 75.80 cm/s  SHUNTS MV E/A ratio:  0.87        Systemic VTI:  0.25 m                            Systemic Diam: 2.10 cm Rachelle Hora Croitoru MD Electronically signed by Thurmon Fair MD Signature Date/Time: 12/11/2019/11:35:52 AM    Final    CT HEAD CODE STROKE WO CONTRAST  Result Date: 12/10/2019 CLINICAL DATA:  Code stroke.  Neurological deficit, not specified. EXAM: CT HEAD WITHOUT CONTRAST TECHNIQUE: Contiguous axial images were obtained from the base of the skull through the vertex without intravenous contrast. COMPARISON:  None. FINDINGS: Brain: Distant right occipital craniectomy. Encephalomalacia the right cerebellar hemisphere. Chronic calcification of the left cerebellar nuclei. Acute intraparenchymal hemorrhage in the left thalamus measuring approximately 2.5 x 1.3 x 2.1 Cm (volume = 3.6 cm^3) mild surrounding edema. Elsewhere, cerebral hemispheres show generalized atrophy, markedly advanced for age. Chronic small-vessel ischemic changes of the deep white matter. Old  bilateral occipital insults with dystrophic calcification. 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 No hydrocephalus or extra-axial collection. Vascular: There is atherosclerotic calcification of the major vessels at the base of the brain. Skull: Right occipital craniectomy as previously noted. Sinuses/Orbits: Clear/normal Other: None ASPECTS (Alberta Stroke Program Early CT Score) - Ganglionic level infarction (caudate, lentiform nuclei, internal capsule, insula, M1-M3 cortex): 7 - Supraganglionic infarction (M4-M6 cortex): 3 Total score (0-10 with 10 being normal): 10 IMPRESSION: 1. Acute intraparenchymal hemorrhage in the left thalamus measuring approximately 2.5 x 1.3 x 2.1 cm (volume 3.6 cm^3). Mild surrounding edema. 2. Old bilateral occipital insults with dystrophic calcification. 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 Chronic small-vessel changes of the hemispheric white matter. 3. Distant right occipital craniectomy. Encephalomalacia the right cerebellar hemisphere. 4. ASPECTS is 10. 5. These results were communicated to Dr. Derry Lory At 3:12 pmon 11/25/2021by text page via the [facility] messaging system. Electronically Signed   By: Paulina Fusi M.D.   On: 12/10/2019 15:14   VAS US CAROTID  Result Date: 12/11/2019 Carotid Arterial Duplex Study Indications:  Left thalamus ICH. Risk Factors: Current smoker. Performing Technologist: Gertie Fey MHA, RDMS, RVT, RDCS  Examination Guidelines: A complete evaluation includes B-mode imaging, spectral Doppler, color Doppler, and power Doppler as needed of all accessible portions of each vessel. Bilateral testing is considered an integral part of a complete examination. Limited examinations for reoccurring indications may be performed as noted.  Right Carotid Findings: +----------+--------+--------+--------+-----------------------+--------+           PSV cm/sEDV cm/sStenosisPlaque Description  Comments  +----------+--------+--------+--------+-----------------------+--------+ CCA Prox  109     16                                              +----------+--------+--------+--------+-----------------------+--------+ CCA Distal99      27              smooth and heterogenous         +----------+--------+--------+--------+-----------------------+--------+ ICA Prox  70      28              smooth and heterogenous         +----------+--------+--------+--------+-----------------------+--------+ ICA Distal116     42                                              +----------+--------+--------+--------+-----------------------+--------+ ECA       96      11                                              +----------+--------+--------+--------+-----------------------+--------+ +----------+--------+-------+----------------+-------------------+           PSV cm/sEDV cmsDescribe        Arm Pressure (mmHG) +----------+--------+-------+----------------+-------------------+ Subclavian150            Multiphasic, WNL                    +----------+--------+-------+----------------+-------------------+ +---------+--------+--+--------+--+---------+ VertebralPSV cm/s68EDV cm/s23Antegrade +---------+--------+--+--------+--+---------+  Left Carotid Findings: +----------+--------+--------+--------+-----------------------+--------+           PSV cm/sEDV cm/sStenosisPlaque Description     Comments +----------+--------+--------+--------+-----------------------+--------+ CCA Prox  123     23                                              +----------+--------+--------+--------+-----------------------+--------+ CCA Distal91      28                                              +----------+--------+--------+--------+-----------------------+--------+ ICA Prox  60      18              smooth and heterogenous          +----------+--------+--------+--------+-----------------------+--------+ ICA Distal80      29                                              +----------+--------+--------+--------+-----------------------+--------+ ECA       81      14                                              +----------+--------+--------+--------+-----------------------+--------+ +----------+--------+--------+----------------+-------------------+           PSV cm/sEDV  cm/sDescribe        Arm Pressure (mmHG) +----------+--------+--------+----------------+-------------------+ [id number]              Multiphasic, WNL                    +----------+--------+--------+----------------+-------------------+ +---------+--------+--+--------+-+---------+ VertebralPSV cm/s50EDV cm/s8Antegrade +---------+--------+--+--------+-+---------+   Summary: Right Carotid: Velocities in the right ICA are consistent with a 1-39% stenosis. Left Carotid: Velocities in the left ICA are consistent with a 1-39% stenosis. Vertebrals:  Bilateral vertebral arteries demonstrate antegrade flow. Subclavians: Normal flow hemodynamics were seen in bilateral subclavian              arteries. *See table(s) above for measurements and observations.     Preliminary       The results of significant diagnostics from this hospitalization (including imaging, microbiology, ancillary and laboratory) are listed below for reference.     Microbiology: Recent Results (from the past 240 hour(s))  Resp Panel by RT-PCR (Flu A&B, Covid) Nasopharyngeal Swab     Status: None   Collection Time: 12/10/19  3:05 PM   Specimen: Nasopharyngeal Swab; Nasopharyngeal(NP) swabs in vial transport medium  Result Value Ref Range Status   SARS Coronavirus 2 by RT PCR NEGATIVE NEGATIVE Final    Comment: (NOTE) SARS-CoV-2 target nucleic acids are NOT DETECTED.  The SARS-CoV-2 RNA is generally detectable in upper respiratory specimens during the acute phase of  infection. The lowest concentration of SARS-CoV-2 viral copies this assay can detect is 138 copies/mL. A negative result does not preclude SARS-Cov-2 infection and should not be used as the sole basis for treatment or other patient management decisions. A negative result may occur with  improper specimen collection/handling, submission of specimen other than nasopharyngeal swab, presence of viral mutation(s) within the areas targeted by this assay, and inadequate number of viral copies(<138 copies/mL). A negative result must be combined with clinical observations, patient history, and epidemiological information. The expected result is Negative.  Fact Sheet for Patients:  BloggerCourse.com  Fact Sheet for Healthcare Providers:  SeriousBroker.it  This test is no t yet approved or cleared by the [place] FDA and  has been authorized for detection and/or diagnosis of SARS-CoV-2 by FDA under an Emergency Use Authorization (EUA). This EUA will remain  in effect (meaning this test can be used) for the duration of the COVID-19 declaration under Section 564(b)(1) of the Act, 21 U.S.C.section 360bbb-3(b)(1), unless the authorization is terminated  or revoked sooner.       Influenza A by PCR NEGATIVE NEGATIVE Final   Influenza B by PCR NEGATIVE NEGATIVE Final    Comment: (NOTE) The Xpert Xpress SARS-CoV-2/FLU/RSV plus assay is intended as an aid in the diagnosis of influenza from Nasopharyngeal swab specimens and should not be used as a sole basis for treatment. Nasal washings and aspirates are unacceptable for Xpert Xpress SARS-CoV-2/FLU/RSV testing.  Fact Sheet for Patients: BloggerCourse.com  Fact Sheet for Healthcare Providers: SeriousBroker.it  This test is not yet approved or cleared by the [place] FDA and has been authorized for detection and/or diagnosis of SARS-CoV-2  by FDA under an Emergency Use Authorization (EUA). This EUA will remain in effect (meaning this test can be used) for the duration of the COVID-19 declaration under Section 564(b)(1) of the Act, 21 U.S.C. section 360bbb-3(b)(1), unless the authorization is terminated or revoked.  Performed at [facility], [address] [place], [place] [address]   MRSA PCR Screening     Status: None  Collection Time: 12/10/19  6:42 PM   Specimen: Nasal Mucosa; Nasopharyngeal  Result Value Ref Range Status   MRSA by PCR NEGATIVE NEGATIVE Final    Comment:        The GeneXpert MRSA Assay (FDA approved for NASAL specimens only), is one component of a comprehensive MRSA colonization surveillance program. It is not intended to diagnose MRSA infection nor to guide or monitor treatment for MRSA infections. Performed at [facility], [address] [place], [place] [address]      Labs: BNP (last 3 results) No results for input(s): BNP in the last 8760 hours. Basic Metabolic Panel: Recent Labs  Lab 12/10/19 1501 12/10/19 1510 12/12/19 0721 12/13/19 0255 12/14/19 0449  NA 131* 132* 134* 138 137  K 4.3 4.7 4.0 4.3 3.6  CL 99 103 103 106 104  CO2 20*  --  21* 21* 22  GLUCOSE 98 94 123* 98 91  BUN 13 15 9 14 13   CREATININE 0.72 0.60 0.60 0.59 0.49  CALCIUM 9.0  --  8.6* 8.8* 8.8*  MG  --   --   --   --  2.1   Liver Function Tests: Recent Labs  Lab 12/10/19 1501  AST 20  ALT 14  ALKPHOS 60  BILITOT 0.6  PROT 6.7  ALBUMIN 3.8   No results for input(s): LIPASE, AMYLASE in the last 168 hours. No results for input(s): AMMONIA in the last 168 hours. CBC: Recent Labs  Lab 12/10/19 1501 12/10/19 1510 12/12/19 0721 12/13/19 0255 12/14/19 0449  WBC 6.2  --  5.5 5.7 5.5  NEUTROABS 3.2  --   --   --   --   HGB 11.6* 12.2 11.3* 10.9* 11.3*  HCT 35.2* 36.0 33.9* 32.8* 35.1*  MCV 94.6  --  94.4 94.8 95.9  PLT 229  --  228 236 262   Cardiac Enzymes: No results for  input(s): CKTOTAL, CKMB, CKMBINDEX, TROPONINI in the last 168 hours. BNP: Invalid input(s): POCBNP CBG: Recent Labs  Lab 12/10/19 1500  GLUCAP 93   D-Dimer No results for input(s): DDIMER in the last 72 hours. Hgb A1c Recent Labs    12/12/19 0721  HGBA1C 5.3   Lipid Profile Recent Labs    12/12/19 0721  CHOL 179  HDL 69  LDLCALC 92  TRIG 90  CHOLHDL 2.6   Thyroid function studies No results for input(s): TSH, T4TOTAL, T3FREE, THYROIDAB in the last 72 hours.  Invalid input(s): FREET3 Anemia work up No results for input(s): VITAMINB12, FOLATE, FERRITIN, TIBC, IRON, RETICCTPCT in the last 72 hours. Urinalysis    Component Value Date/Time   COLORURINE YELLOW 12/11/2019 0620   APPEARANCEUR CLEAR 12/11/2019 0620   LABSPEC 1.012 12/11/2019 0620   PHURINE 6.0 12/11/2019 0620   GLUCOSEU NEGATIVE 12/11/2019 0620   HGBUR NEGATIVE 12/11/2019 0620   BILIRUBINUR NEGATIVE 12/11/2019 0620   KETONESUR NEGATIVE 12/11/2019 0620   PROTEINUR NEGATIVE 12/11/2019 0620   NITRITE NEGATIVE 12/11/2019 0620   LEUKOCYTESUR NEGATIVE 12/11/2019 0620   Sepsis Labs Invalid input(s): PROCALCITONIN,  WBC,  LACTICIDVEN Microbiology Recent Results (from the past 240 hour(s))  Resp Panel by RT-PCR (Flu A&B, Covid) Nasopharyngeal Swab     Status: None   Collection Time: 12/10/19  3:05 PM   Specimen: Nasopharyngeal Swab; Nasopharyngeal(NP) swabs in vial transport medium  Result Value Ref Range Status   SARS Coronavirus 2 by RT PCR NEGATIVE NEGATIVE Final    Comment: (NOTE) SARS-CoV-2 target nucleic acids are NOT  DETECTED.  The SARS-CoV-2 RNA is generally detectable in upper respiratory specimens during the acute phase of infection. The lowest concentration of SARS-CoV-2 viral copies this assay can detect is 138 copies/mL. A negative result does not preclude SARS-Cov-2 infection and should not be used as the sole basis for treatment or other patient management decisions. A negative result may  occur with  improper specimen collection/handling, submission of specimen other than nasopharyngeal swab, presence of viral mutation(s) within the areas targeted by this assay, and inadequate number of viral copies(<138 copies/mL). A negative result must be combined with clinical observations, patient history, and epidemiological information. The expected result is Negative.  Fact Sheet for Patients:  BloggerCourse.com  Fact Sheet for Healthcare Providers:  SeriousBroker.it  This test is no t yet approved or cleared by the [place] FDA and  has been authorized for detection and/or diagnosis of SARS-CoV-2 by FDA under an Emergency Use Authorization (EUA). This EUA will remain  in effect (meaning this test can be used) for the duration of the COVID-19 declaration under Section 564(b)(1) of the Act, 21 U.S.C.section 360bbb-3(b)(1), unless the authorization is terminated  or revoked sooner.       Influenza A by PCR NEGATIVE NEGATIVE Final   Influenza B by PCR NEGATIVE NEGATIVE Final    Comment: (NOTE) The Xpert Xpress SARS-CoV-2/FLU/RSV plus assay is intended as an aid in the diagnosis of influenza from Nasopharyngeal swab specimens and should not be used as a sole basis for treatment. Nasal washings and aspirates are unacceptable for Xpert Xpress SARS-CoV-2/FLU/RSV testing.  Fact Sheet for Patients: BloggerCourse.com  Fact Sheet for Healthcare Providers: SeriousBroker.it  This test is not yet approved or cleared by the [place] FDA and has been authorized for detection and/or diagnosis of SARS-CoV-2 by FDA under an Emergency Use Authorization (EUA). This EUA will remain in effect (meaning this test can be used) for the duration of the COVID-19 declaration under Section 564(b)(1) of the Act, 21 U.S.C. section 360bbb-3(b)(1), unless the authorization is terminated  or revoked.  Performed at [facility], [address], [place], [place] [address]   MRSA PCR Screening     Status: None   Collection Time: 12/10/19  6:42 PM   Specimen: Nasal Mucosa; Nasopharyngeal  Result Value Ref Range Status   MRSA by PCR NEGATIVE NEGATIVE Final    Comment:        The GeneXpert MRSA Assay (FDA approved for NASAL specimens only), is one component of a comprehensive MRSA colonization surveillance program. It is not intended to diagnose MRSA infection nor to guide or monitor treatment for MRSA infections. Performed at [facility], [address], [place], [place] [address]      Time coordinating discharge:  I have spent 35 minutes face to face with the patient and on the ward discussing the patients care, assessment, plan and disposition with other care givers. >50% of the time was devoted counseling the patient about the risks and benefits of treatment/Discharge disposition and coordinating care.   SIGNED:   Dimple Nanas, MD  [facility] 12/14/2019, 7:49 AM   If 7PM-7AM, please contact night-coverage

## 2019-12-15 ENCOUNTER — Inpatient Hospital Stay (HOSPITAL_COMMUNITY): Payer: Self-pay

## 2019-12-15 ENCOUNTER — Inpatient Hospital Stay (HOSPITAL_COMMUNITY): Payer: Self-pay | Admitting: Speech Pathology

## 2019-12-15 DIAGNOSIS — I610 Nontraumatic intracerebral hemorrhage in hemisphere, subcortical: Secondary | ICD-10-CM

## 2019-12-15 LAB — URINALYSIS, ROUTINE W REFLEX MICROSCOPIC
Bilirubin Urine: NEGATIVE
Glucose, UA: NEGATIVE mg/dL
Hgb urine dipstick: NEGATIVE
Ketones, ur: NEGATIVE mg/dL
Nitrite: NEGATIVE
Protein, ur: NEGATIVE mg/dL
Specific Gravity, Urine: 1.025 (ref 1.005–1.030)
pH: 5 (ref 5.0–8.0)

## 2019-12-15 LAB — CBC WITH DIFFERENTIAL/PLATELET
Abs Immature Granulocytes: 0.02 10*3/uL (ref 0.00–0.07)
Basophils Absolute: 0.1 10*3/uL (ref 0.0–0.1)
Basophils Relative: 1 %
Eosinophils Absolute: 0.1 10*3/uL (ref 0.0–0.5)
Eosinophils Relative: 2 %
HCT: 37.2 % (ref 36.0–46.0)
Hemoglobin: 11.8 g/dL — ABNORMAL LOW (ref 12.0–15.0)
Immature Granulocytes: 0 %
Lymphocytes Relative: 31 %
Lymphs Abs: 2.1 10*3/uL (ref 0.7–4.0)
MCH: 31.1 pg (ref 26.0–34.0)
MCHC: 31.7 g/dL (ref 30.0–36.0)
MCV: 98.2 fL (ref 80.0–100.0)
Monocytes Absolute: 0.6 10*3/uL (ref 0.1–1.0)
Monocytes Relative: 10 %
Neutro Abs: 3.7 10*3/uL (ref 1.7–7.7)
Neutrophils Relative %: 56 %
Platelets: 291 10*3/uL (ref 150–400)
RBC: 3.79 MIL/uL — ABNORMAL LOW (ref 3.87–5.11)
RDW: 15 % (ref 11.5–15.5)
WBC: 6.7 10*3/uL (ref 4.0–10.5)
nRBC: 0 % (ref 0.0–0.2)

## 2019-12-15 LAB — COMPREHENSIVE METABOLIC PANEL
ALT: 27 U/L (ref 0–44)
AST: 29 U/L (ref 15–41)
Albumin: 3.4 g/dL — ABNORMAL LOW (ref 3.5–5.0)
Alkaline Phosphatase: 61 U/L (ref 38–126)
Anion gap: 11 (ref 5–15)
BUN: 16 mg/dL (ref 6–20)
CO2: 23 mmol/L (ref 22–32)
Calcium: 9.2 mg/dL (ref 8.9–10.3)
Chloride: 105 mmol/L (ref 98–111)
Creatinine, Ser: 0.58 mg/dL (ref 0.44–1.00)
GFR, Estimated: >60 mL/min (ref >60)
Glucose, Bld: 91 mg/dL (ref 70–99)
Potassium: 4 mmol/L (ref 3.5–5.1)
Sodium: 139 mmol/L (ref 135–145)
Total Bilirubin: 0.7 mg/dL (ref 0.3–1.2)
Total Protein: 6.9 g/dL (ref 6.5–8.1)

## 2019-12-15 MED ORDER — SENNOSIDES-DOCUSATE SODIUM 8.6-50 MG PO TABS
1.0000 | ORAL_TABLET | Freq: Every day | ORAL | Status: DC
  Filled 2019-12-15: qty 1

## 2019-12-15 MED ORDER — ENSURE ENLIVE PO LIQD
237.0000 mL | Freq: Two times a day (BID) | ORAL | Status: DC
  Administered 2019-12-15 – 2019-12-28 (×20): 237 mL via ORAL
  Filled 2019-12-15 (×3): qty 237

## 2019-12-15 NOTE — Progress Notes (Signed)
Inpatient Rehabilitation  Patient information reviewed and entered into eRehab system by  M. , M.A., CCC/SLP, PPS Coordinator.  Information including medical coding, functional ability and quality indicators will be reviewed and updated through discharge.    

## 2019-12-15 NOTE — Evaluation (Signed)
Occupational Therapy Assessment and Plan  Patient Details  Name: Melissa Colon MRN: [id number] Date of Birth: August 02, 1960  OT Diagnosis: abnormal posture, ataxia, blindness and low vision, cognitive deficits and hemiplegia affecting dominant side Rehab Potential: Rehab Potential (ACUTE ONLY): Good ELOS: 2.5- 3 weeks   Today's Date: 12/15/2019 OT Individual Time: 1300-1415 OT Individual Time Calculation (min): 75 min     Hospital Problem: Principal Problem:   ICH (intracerebral hemorrhage) ([place]) L thalamic HTN HMG   Past Medical History:  Past Medical History:  Diagnosis Date  . Alcohol dependence ([place])   . H/O brain tumor   . Smoker    Past Surgical History:  Past Surgical History:  Procedure Laterality Date  . 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  Per chart review patient lives with spouse, son, brother-in-law and sister-in-law.  1 level home 5 steps to entry.  Reportedly independent prior to admission.  Presented 12/10/2019 with right side weakness and mild aphasia.  Cranial CT scan showed acute intraparenchymal hemorrhage in the left thalamus measuring 2.5 x 1.3 x 2.1 cm.  Mild surrounding edema.  Old bilateral occipital insults with dystrophic calcification.  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  MRA of the head high-grade proximal left P2 segment stenosis.  No other significant proximal stenosis aneurysm or branch vessel occlusion.  A follow-up MRI showed a stable 2.4 cm acute hemorrhage left thalamus with edema extending into the left cerebral peduncle and posterior limb left internal capsule.  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  Admission chemistries alcohol negative sodium 131 hemoglobin 11.6 urine drug screen negative.  Echocardiogram with ejection fraction  of 60 to 65% no wall motion abnormalities grade 1 diastolic dysfunction.  Neurology follow-up close monitoring of blood pressure.  Patient was cleared to begin subcutaneous heparin for DVT prophylaxis.  Tolerating a regular diet.  Therapy evaluations completed and patient was admitted for a comprehensive rehab program. Patient transferred to CIR on 12/14/2019 .    Patient currently requires mod with basic self-care skills secondary to muscle weakness, decreased cardiorespiratoy endurance, abnormal tone, ataxia, decreased coordination and decreased motor planning, decreased awareness and decreased safety awareness and decreased sitting balance, decreased standing balance, decreased postural control, hemiplegia and decreased balance strategies.  Prior to hospitalization, patient could complete ADLs with independent .  Patient will benefit from skilled intervention to decrease level of assist with basic self-care skills prior to discharge home with care partner.  Anticipate patient will require intermittent supervision and minimal physical assistance and follow up home health.  OT - End of Session Activity Tolerance: Tolerates 10 - 20 min activity with multiple rests Endurance Deficit: Yes Endurance Deficit Description: generalized weakness OT Assessment Rehab Potential (ACUTE ONLY): Good OT Barriers to Discharge: Inaccessible home environment OT Barriers to Discharge Comments: stairs to enter OT Patient demonstrates impairments in the following area(s): Balance;Perception;Safety;Cognition;Sensory;Endurance;Motor;Vision OT Basic ADL's Functional Problem(s): Eating;Grooming;Dressing;Bathing;Toileting OT Transfers Functional Problem(s): Toilet;Tub/Shower OT Additional Impairment(s): Fuctional Use of Upper Extremity OT Plan OT Intensity: Minimum of 1-2 x/day, 45 to 90 minutes OT Frequency: 5 out of 7 days OT Duration/Estimated Length of Stay: 2.5- 3 weeks OT Treatment/Interventions:  Balance/vestibular training;Discharge planning;Functional electrical stimulation;Pain management;Self Care/advanced ADL retraining;Therapeutic Activities;UE/LE Coordination activities;Visual/perceptual remediation/compensation;Therapeutic Exercise;Patient/family education;Functional mobility training;Disease mangement/prevention;Cognitive remediation/compensation;Community reintegration;DME/adaptive equipment instruction;Psychosocial support;Neuromuscular re-education;Splinting/orthotics;UE/LE Strength taining/ROM;Wheelchair propulsion/positioning OT Self Feeding Anticipated Outcome(s): (  S) OT Basic Self-Care Anticipated Outcome(s): CGA OT Toileting Anticipated Outcome(s): CGA OT Bathroom Transfers Anticipated Outcome(s): CGA OT Recommendation Recommendations for Other Services: Neuropsych consult Patient destination: Home Follow Up Recommendations: Home health OT Equipment Recommended: To be determined   OT Evaluation Precautions/Restrictions  Precautions Precautions: Fall Precaution Comments: R hemiparesis and incoordination, pushing to R in stand Restrictions Weight Bearing Restrictions: No General Chart Reviewed: Yes Family/Caregiver Present: Yes (husband arrived at end of session) Vital Signs   Pain Pain Assessment Pain Scale: 0-10 Pain Score: 0-No pain Home Living/Prior Functioning Home Living Family/patient expects to be discharged to:: Private residence Living Arrangements: Spouse/significant other Available Help at Discharge: Family, Available 24 hours/day Type of Home: House Home Access: Stairs to enter [employer] of Steps: 4-5 1 rail Entrance Stairs-Rails: Right, Left Home Layout: One level Bathroom Shower/Tub: Gaffer, Chiropodist: Standard Bathroom Accessibility: Yes Additional Comments: Reports she lives with her husband, brother, and niece  Lives With: Spouse, Family IADL History Homemaking Responsibilities: Yes Meal  Prep Responsibility: Secondary Laundry Responsibility: Secondary Cleaning Responsibility: Secondary Bill Paying/Finance Responsibility: Secondary Shopping Responsibility: Secondary Child Care Responsibility: No Current License: No Occupation: Unemployed Leisure and Hobbies: Enjoyed crocheting Prior Function Level of Independence: Independent with gait, Independent with transfers, Independent with homemaking with ambulation, Independent with basic ADLs  Able to Take Stairs?: Yes Driving: No Vocation: Retired Comments: Pt reports she was fully independent.  Does not drive, does not work  Vision Baseline Vision/History: Wears glasses Wears Glasses: At all times Patient Visual Report: Diplopia Vision Assessment?: Yes Eye Alignment: Impaired (comment) (deviated R) Ocular Range of Motion: Restricted on the right Alignment/Gaze Preference: Head turned Tracking/Visual Pursuits: Decreased smoothness of horizontal tracking;Decreased smoothness of vertical tracking;Right eye does not track laterally Saccades: Decreased speed of saccadic movement Convergence: Impaired (comment) (diplopia present all the time) Visual Fields: No apparent deficits Diplopia Assessment: Disappears with one eye closed;Objects split side to side Depth Perception: Overshoots Perception  Perception: Impaired Inattention/Neglect: Does not attend to right visual field;Does not attend to right side of body Praxis Praxis: Impaired Praxis Impairment Details: Motor planning Cognition Overall Cognitive Status: Impaired/Different from baseline Arousal/Alertness: Awake/alert Orientation Level: Person;Place;Situation Person: Oriented Place: Oriented Situation: Oriented Year: 2021 Month: November Day of Week: Correct Memory: Impaired Memory Impairment: Decreased short term memory Decreased Short Term Memory: Verbal basic;Functional basic Immediate Memory Recall: Sock;Blue;Bed Memory Recall Sock: Not able to  recall Memory Recall Blue: Without Cue Memory Recall Bed: Without Cue Attention: Sustained Focused Attention: Appears intact Sustained Attention: Appears intact Awareness: Appears intact Problem Solving: Impaired Problem Solving Impairment: Functional complex Safety/Judgment: Appears intact Sensation Sensation Light Touch: Impaired Detail Light Touch Impaired Details: Absent RLE;Absent RUE Proprioception: Impaired by gross assessment;Impaired Detail Proprioception Impaired Details: Absent RUE;Absent RLE Coordination Gross Motor Movements are Fluid and Coordinated: No Fine Motor Movements are Fluid and Coordinated: No Coordination and Movement Description: R hemi with R inattention, as well as ataxia Finger Nose Finger Test: overshooting and dysmetric Motor  Motor Motor: Hemiplegia;Abnormal postural alignment and control;Abnormal tone Motor - Skilled Clinical Observations: R hemi with ataxia and inattention  Trunk/Postural Assessment  Cervical Assessment Cervical Assessment: Exceptions to [facility] (forward head) Thoracic Assessment Thoracic Assessment: Exceptions to [facility] (rounded shoulders, kyphotic posture) Lumbar Assessment Lumbar Assessment: Exceptions to [facility] (posterior pelvic tilt) Postural Control Postural Control: Deficits on evaluation Righting Reactions: delayed  Balance Balance Balance Assessed: Yes Static Sitting Balance Static Sitting - Balance Support: No upper extremity supported;Feet supported Static Sitting - Level of  Assistance: 5: Stand by assistance Dynamic Sitting Balance Dynamic Sitting - Balance Support: No upper extremity supported;Feet supported Dynamic Sitting - Level of Assistance: 4: Min assist Static Standing Balance Static Standing - Balance Support: No upper extremity supported;During functional activity Static Standing - Level of Assistance: 3: Mod assist Dynamic Standing Balance Dynamic Standing - Balance Support: No upper extremity  supported;During functional activity Dynamic Standing - Level of Assistance: 2: Max assist Extremity/Trunk Assessment RUE Assessment RUE Assessment: Exceptions to [facility] General Strength Comments: Ataxic movements with diminished sensation RUE Body System: Neuro Brunstrum levels for arm and hand: Arm;Hand Brunstrum level for arm: Stage IV Movement is deviating from synergy Brunstrum level for hand: Stage V Independence from basic synergies LUE Assessment LUE Assessment: Within Functional Limits  Care Tool Care Tool Self Care Eating   Eating Assist Level: Minimal Assistance - Patient > 75%    Oral Care    Oral Care Assist Level: Supervision/Verbal cueing    Bathing   Body parts bathed by patient: Right arm;Left arm;Chest;Abdomen;Front perineal area;Right upper leg;Left upper leg;Face Body parts bathed by helper: Buttocks;Right lower leg;Left lower leg   Assist Level: Moderate Assistance - Patient 50 - 74%    Upper Body Dressing(including orthotics)   What is the patient wearing?: Pull over shirt   Assist Level: Minimal Assistance - Patient > 75%    Lower Body Dressing (excluding footwear)   What is the patient wearing?: Underwear/pull up;Pants Assist for lower body dressing: Moderate Assistance - Patient 50 - 74%    Putting on/Taking off footwear   What is the patient wearing?: Non-skid slipper socks Assist for footwear: Maximal Assistance - Patient 25 - 49%       Care Tool Toileting Toileting activity   Assist for toileting: Maximal Assistance - Patient 25 - 49%     Care Tool Bed Mobility Roll left and right activity   Roll left and right assist level: Minimal Assistance - Patient > 75%    Sit to lying activity   Sit to lying assist level: Minimal Assistance - Patient > 75%    Lying to sitting edge of bed activity   Lying to sitting edge of bed assist level: Minimal Assistance - Patient > 75%     Care Tool Transfers Sit to stand transfer   Sit to stand assist  level: Moderate Assistance - Patient 50 - 74%    Chair/bed transfer   Chair/bed transfer assist level: Maximal Assistance - Patient 25 - 49%     Toilet transfer   Assist Level: Maximal Assistance - Patient 24 - 49%     Care Tool Cognition Expression of Ideas and Wants Expression of Ideas and Wants: Some difficulty - exhibits some difficulty with expressing needs and ideas (e.g, some words or finishing thoughts) or speech is not clear   Understanding Verbal and Non-Verbal Content Understanding Verbal and Non-Verbal Content: Usually understands - understands most conversations, but misses some part/intent of message. Requires cues at times to understand   Memory/Recall Ability *first 3 days only Memory/Recall Ability *first 3 days only: That he or she is in a hospital/hospital unit;Current season    Refer to Care Plan for Long Term Goals  SHORT TERM GOAL WEEK 1 OT Short Term Goal 1 (Week 1): Pt will don shirt with CGA OT Short Term Goal 2 (Week 1): Pt will require no more than min cueing for positioning of her RUE OT Short Term Goal 3 (Week 1): Pt will complete toilet transfer with  mod A  Recommendations for other services: Neuropsych   Skilled Therapeutic Intervention ADL ADL Eating: Minimal assistance;Minimal cueing Where Assessed-Eating: Bed level Grooming: Setup Where Assessed-Grooming: Sitting at sink Upper Body Bathing: Minimal assistance;Minimal cueing Where Assessed-Upper Body Bathing: Edge of bed Lower Body Bathing: Moderate assistance Where Assessed-Lower Body Bathing: Edge of bed Upper Body Dressing: Minimal assistance;Minimal cueing Where Assessed-Upper Body Dressing: Edge of bed Lower Body Dressing: Moderate assistance;Minimal cueing Where Assessed-Lower Body Dressing: Edge of bed Toileting: Maximal assistance;Minimal cueing Where Assessed-Toileting: Bedside Commode Toilet Transfer: Maximal assistance Toilet Transfer Method: Stand pivot Toilet Transfer  Equipment: Drop arm bedside commode Mobility  Bed Mobility Bed Mobility: Rolling Right;Rolling Left;Supine to Sit;Sit to Supine Rolling Right: Supervision/verbal cueing Rolling Left: Minimal Assistance - Patient > 75% Supine to Sit: Minimal Assistance - Patient > 75% Sit to Supine: Minimal Assistance - Patient > 75% Transfers Sit to Stand: Moderate Assistance - Patient 50-74% Stand to Sit: Moderate Assistance - Patient 50-74%  Skilled OT intervention: Pt received supine with no c/o pain. Discussed OT POC, ELOS, CLOF, and rehab expectations. Pt limited by R pushing in standing, ataxia, visual deficits, and mild cognitive deficits. ADLs and transfers completed as descibred above.   Discharge Criteria: Patient will be discharged from OT if patient refuses treatment 3 consecutive times without medical reason, if treatment goals not met, if there is a change in medical status, if patient makes no progress towards goals or if patient is discharged from hospital.  The above assessment, treatment plan, treatment alternatives and goals were discussed and mutually agreed upon: by patient  Curtis Sites 12/15/2019, 3:30 PM

## 2019-12-15 NOTE — Patient Care Conference (Signed)
Inpatient RehabilitationTeam Conference and Plan of Care Update Date: 12/15/2019   Time: 10:21 AM    Patient Name: Melissa Colon      Medical Record Number: [id number]  Date of Birth: 11-23-1960 Sex: Female         Room/Bed: 4W01C/4W01C-01 Payor Info: Payor: /    Admit Date/Time:  12/14/2019  1:33 PM  Primary Diagnosis:  ICH (intracerebral hemorrhage) [facility])  Hospital Problems: Principal Problem:   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    Expected Discharge Date: Expected Discharge Date:  (2-3 weeks)  Team Members Present: Physician leading conference: Dr. Sula Soda Care Coodinator Present: Cecile Sheerer, LCSWA; Marlyne Beards, RN, BSN, CRRN Nurse Present: Luevenia Maxin, LPN PT Present: Serina Cowper, PT OT Present: Jake Shark, OT SLP Present: Feliberto Gottron, SLP PPS Coordinator present : Edson Snowball, Park Breed, SLP     Current Status/Progress Goal Weekly Team Focus  Bowel/Bladder   Pt continent of B/B. LBM 12/14/2019  Pt to remain continent of B/B  Assess B/B every shift and PRN   Swallow/Nutrition/ Hydration             ADL's   eval pending         Mobility             Communication   Eval Pending         Safety/Cognition/ Behavioral Observations  Eval Pending         Pain   Pt denies pain at this time  Pain level <3/10  Assess pain every shift and PRN   Skin   Ecchymosis to left lower extremity  No new skin breakdown  Assess skin every shift and PRN     Discharge Planning:  Pt to be assessed. Per EMR, pt lives with s/o, son, and brother and sister in law-none of which work and able to provide 24/7 care.   Team Discussion: Continent B/B, urine cloudy. OT, SLP evals pending. PT reports patient has right inattention, neglect, is a strong right pusher. Patient is a Stedy +2 for transfers. No goals set as of yet. Patient on target to meet rehab goals: Evals and goals still pending.  *See Care Plan and progress  notes for long and short-term goals.   Revisions to Treatment Plan:  MD put in for a UA, C&S  Teaching Needs: Begin family education when appropriate.  Current Barriers to Discharge: Inaccessible home environment, Decreased caregiver support, Medical stability, Home enviroment access/layout and Lack of/limited family support  Possible Resolutions to Barriers: Continue current medications, provide intermittent catheterization when unable to void and provide education, provide emotional support to patient and family.     Medical Summary Current Status: Unable to void this morning and urine was cloudy with odor, right sided neglect, poor motor planning  Barriers to Discharge: Medical stability  Barriers to Discharge Comments: Unable to void this morning and urine was cloudy with odor, right sided neglect, poor motor planning Possible Resolutions to Barriers/Weekly Focus: UA/UC this morning, intermittent catheterization as needed, taping glasses   Continued Need for Acute Rehabilitation Level of Care: The patient requires daily medical management by a physician with specialized training in physical medicine and rehabilitation for the following reasons: Direction of a multidisciplinary physical rehabilitation program to maximize functional independence : Yes Medical management of patient stability for increased activity during participation in an intensive rehabilitation regime.: Yes Analysis of laboratory values and/or radiology reports with any subsequent need for medication adjustment and/or medical intervention. : Yes  I attest that I was present, lead the team conference, and concur with the assessment and plan of the team.   Kennyth Arnold G 12/15/2019, 1:11 PM

## 2019-12-15 NOTE — Progress Notes (Signed)
Patient ID: Melissa Colon, female   DOB: Jan 23, 1960, 59 y.o.   MRN: [id number]  SW met with pt in room to complete assessment. SW called pt brother in law Melissa Colon [phone]) to inform on updates from team conference with ELOS 2.5-3 weeks. SW informed there will be follow-up once there is more information. He confirms that pt will have 24/7 care at discharge.   Loralee Pacas, MSW, [place] Office: [phone] Cell: [phone] Fax: [phone]

## 2019-12-15 NOTE — Progress Notes (Signed)
Pt with a liquid incontinent stool. Pt not aware whether she voided with stool. Bladder scanned pt twice for 143.

## 2019-12-15 NOTE — Progress Notes (Addendum)
[place] PHYSICAL MEDICINE & REHABILITATION PROGRESS NOTE   Subjective/Complaints: Unable to void this morning- required straight catheterization. RN reports urine was cloudy with foul order- UA/UC ordered.  HR and BP under good control Admission labs stable  ROS: + "blow-out" BM this morning   Objective:   No results found. Recent Labs    12/14/19 0449 12/15/19 0721  WBC 5.5 6.7  HGB 11.3* 11.8*  HCT 35.1* 37.2  PLT 262 291   Recent Labs    12/14/19 0449 12/15/19 0721  NA 137 139  K 3.6 4.0  CL 104 105  CO2 22 23  GLUCOSE 91 91  BUN 13 16  CREATININE 0.49 0.58  CALCIUM 8.8* 9.2    Intake/Output Summary (Last 24 hours) at 12/15/2019 1115 Last data filed at 12/15/2019 0931 Gross per 24 hour  Intake 100 ml  Output 700 ml  Net -600 ml        Physical Exam: Vital Signs Blood pressure 121/76, pulse 86, temperature 98.2 F (36.8 C), temperature source Oral, resp. rate 18, height 5\' 2"  (1.575 m), SpO2 99 %. Gen: no distress, normal appearing HEENT: oral mucosa pink and moist, left gaze preference Cardio: Reg rate Chest: normal effort, normal rate of breathing Abd: soft, non-distended Ext: no edema Skin: intact Neuro: Patient is alert in no acute distress.  R facial droop. She made eye contact with examiner and follows commands.  Provides name and age. Slow speech. RUE 3/4 SA, EE, EF, 4-/5 hand grip and WE RLE 3/5 HF, KE, 4/5 DF, PF Decreased sensation throughout right side.  Psych: Pt's affect is appropriate. Pt is cooperative   Assessment/Plan: 1. Functional deficits which require 3+ hours per day of interdisciplinary therapy in a comprehensive inpatient rehab setting.  Physiatrist is providing close team supervision and 24 hour management of active medical problems listed below.  Physiatrist and rehab team continue to assess barriers to discharge/monitor patient progress toward functional and medical goals  Care Tool:  Bathing               Bathing assist       Upper Body Dressing/Undressing Upper body dressing        Upper body assist      Lower Body Dressing/Undressing Lower body dressing            Lower body assist       Toileting Toileting    Toileting assist       Transfers Chair/bed transfer  Transfers assist           Locomotion Ambulation   Ambulation assist              Walk 10 feet activity   Assist           Walk 50 feet activity   Assist           Walk 150 feet activity   Assist           Walk 10 feet on uneven surface  activity   Assist           Wheelchair     Assist               Wheelchair 50 feet with 2 turns activity    Assist            Wheelchair 150 feet activity     Assist          Blood pressure 121/76, pulse 86, temperature 98.2 F (36.8  C), temperature source Oral, resp. rate 18, height 5\' 2"  (1.575 m), SpO2 99 %.  Medical Problem List and Plan: 1.  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  Antithrombotics: -DVT/anticoagulation: Subcutaneous heparin             -antiplatelet therapy: N/A 3. Pain Management: Tylenol as needed 4. Mood: Provide emotional support             -antipsychotic agents: N/A 5. Neuropsych: This patient is capable of making decisions on her own behalf. 6. Skin/Wound Care: Routine skin checks 7. Fluids/Electrolytes/Nutrition: Routine in and outs with follow-up chemistries 8.  Hypertension.  Norvasc 5 mg daily.  Monitor with increased mobility. Well controlled. 9.  Tobacco abuse.  NicoDerm patch.  Provide counseling 10.  History of alcohol use.  Provide counseling. Continue folic acid supplement 11. Tachycardic to 103 on admission- monitor TID.   11/30: under good  control.  12. Urinary retention: Unable to void 11/30- required straight catheterization. RN reports urine was cloudy with foul order- UA/UC ordered 13. Acute blood loss anemia: Monitor Hgb weekly. 11.8 on 11/30 14. "Blow-out" BM on 11/30: decrease senna-docusate to daily     LOS: 1 days A FACE TO FACE EVALUATION WAS PERFORMED   P  12/15/2019, 11:15 AM

## 2019-12-15 NOTE — Progress Notes (Signed)
Initial Nutrition Assessment  DOCUMENTATION CODES:   Severe malnutrition in context of social or environmental circumstances  INTERVENTION:   - Continue MVI with minerals daily  - Magic cup BID with meals, each supplement provides 290 kcal and 9 grams of protein  - Ensure Enlive po BID, each supplement provides 350 kcal and 20 grams of protein  NUTRITION DIAGNOSIS:   Severe Malnutrition related to social / environmental circumstances (EtOH abuse) as evidenced by severe muscle depletion, severe fat depletion.  GOAL:   Patient will meet greater than or equal to 90% of their needs  MONITOR:   PO intake, Supplement acceptance, Labs, Weight trends  REASON FOR ASSESSMENT:   Malnutrition Screening Tool    ASSESSMENT:   59 year old female with PMH of EtOH and tobacco use, right cerebellar brain tumor with craniotomy as a teenager. Presented 12/10/19 with right-sided weakness and mild aphasia. Cranial CT scan showed acute intraparenchymal hemorrhage in the left thalamus. Admitted to CIR on 11/29.   Spoke with pt at bedside. Pt reports that her appetite is "really good" and that she is eating well. She reports that for breakfast today she had scrambled eggs, bacon, and breakfast potatoes. Meal completion for breakfast today charted as 50%.  Pt reports that at home she also eats well. She has not noticed a decrease in appetite or PO intake. Pt reports that she has noticed that she has lost weight. She does not know what she normally weighs and does not know her current weight. She states that her clothes have been fitting more loosely and that her friends and family have commented on her weight loss. No weight history available in chart.  Pt is willing to consume oral nutrition supplements during admission to aid in meeting kcal and protein needs. Discussed with pt the importance of adequate PO intake in maintaining weight and lean muscle mass and promoting healing. RD to order [employer]  and [employer].  Meal Completion: 10-50%  Medications reviewed and include: folic acid, MVI with minerals, protonix, senna, thiamine  Labs reviewed.  NUTRITION - FOCUSED PHYSICAL EXAM:    Most Recent Value  Orbital Region Moderate depletion  Upper Arm Region Severe depletion  Thoracic and Lumbar Region Severe depletion  Buccal Region Moderate depletion  Temple Region Moderate depletion  Clavicle Bone Region Severe depletion  Clavicle and Acromion Bone Region Severe depletion  Scapular Bone Region Moderate depletion  Dorsal Hand Severe depletion  Patellar Region Severe depletion  Anterior Thigh Region Severe depletion  Posterior Calf Region Moderate depletion  Edema (RD Assessment) None  Hair Reviewed  Eyes Reviewed  Mouth Reviewed  Skin Reviewed  Nails Reviewed       Diet Order:   Diet Order            Diet regular Room service appropriate? Yes with Assist; Fluid consistency: Thin  Diet effective now                 EDUCATION NEEDS:   Education needs have been addressed  Skin:  Skin Assessment: Reviewed RN Assessment  Last BM:  12/15/19 large type 6  Height:   Ht Readings from Last 1 Encounters:  12/14/19 5\' 2"  (1.575 m)    Weight:   Wt Readings from Last 1 Encounters:  12/10/19 49.9 kg    BMI:  Body mass index is 20.12 kg/m.  Estimated Nutritional Needs:   Kcal:  1550-1750  Protein:  70-85 grams  Fluid:  1.5-1.7 L    12/12/19  Vertell Limber, MS, RD, LDN Inpatient Clinical Dietitian Please see [facility] for contact information.

## 2019-12-15 NOTE — Progress Notes (Signed)
   Patient Details  Name: Melissa Colon MRN: [id number] Date of Birth: [birth year]  Today's Date: 12/15/2019  Hospital Problems: Principal Problem:   ICH (intracerebral hemorrhage) ([place]) L thalamic HTN HMG  Past Medical History:  Past Medical History:  Diagnosis Date  . Alcohol dependence ([place])   . H/O brain tumor   . Smoker    Past Surgical History:  Past Surgical History:  Procedure Laterality Date  . CRANIOTOMY FOR TUMOR     Social History:  reports that she has been smoking cigarettes. She has never used smokeless tobacco. She reports current alcohol use of about 8.0 standard drinks of alcohol per week. She reports previous drug use.  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 Recent Psychosocial Issues: Denies Psychiatric History: Denies Substance Abuse History: Denies. Pt admits she smokes cigarettes 1ppd, and drinks 3 12oz cans of beer daily  Patient / Family Perceptions, Expectations & Goals Pt/Family  understanding of illness & functional limitations: Pt and family have general understanding of care needs Premorbid pt/family roles/activities: Independent Anticipated changes in roles/activities/participation: Assistance with ADLs/IADLs Pt/family expectations/goals: Pt reports she would like to "work on the R side and getting it back to working up to par."  [employer]: None Premorbid Home Care/DME Agencies: None Transportation available at discharge: family Resource referrals recommended: Neuropsychology  Discharge Planning Living Arrangements: Spouse/significant other, Children, Other relatives Support Systems: Children, Spouse/significant other, Other relatives Type of Residence: Private residence Insurance Resources: Teacher, adult education Resources: Family Support Financial Screen Referred: Yes Living Expenses: Rent Money Management: Spouse Does the patient have any problems obtaining your medications?: No Home Management: Pt reports that brother in law Paullina prepares all meals. States she does all housekeeping needs Care Coordinator Anticipated Follow Up Needs: HH/OP  Clinical Impression SW met with pt in room at bedside to introduce self, explain role, and discuss discharge process. Pt is uninsured. Pt has no HCPOA. Not veteran.   A  12/15/2019, 3:40 PM

## 2019-12-15 NOTE — Evaluation (Signed)
Physical Therapy Assessment and Plan  Patient Details  Name: Melissa Colon MRN: [id number] Date of Birth: [birth year]  PT Diagnosis: Abnormal posture, Abnormality of gait, Cognitive deficits, Coordination disorder, Difficulty walking, Hemiparesis dominant, Hemiplegia dominant, Hypertonia, Hypotonia and Impaired sensation Rehab Potential: Good ELOS: 2.5-3 weeks   Today's Date: 12/15/2019 PT Individual Time: 0800-0916 PT Individual Time Calculation: 76 min  Hospital Problem: Principal Problem:   ICH (intracerebral hemorrhage) ([place]) L thalamic HTN HMG   Past Medical History:  Past Medical History:  Diagnosis Date  . Alcohol dependence ([place])   . H/O brain tumor   . Smoker    Past Surgical History:  Past Surgical History:  Procedure Laterality Date  . CRANIOTOMY FOR TUMOR      Assessment & Plan Clinical Impression: Patient is a 59 y.o. 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  Per chart review patient lives with spouse, son, brother-in-law and sister-in-law.  1 level home 5 steps to entry.  Reportedly independent prior to admission.  Presented 12/10/2019 with right side weakness and mild aphasia.  Cranial CT scan showed acute intraparenchymal hemorrhage in the left thalamus measuring 2.5 x 1.3 x 2.1 cm.  Mild surrounding edema.  Old bilateral occipital insults with dystrophic calcification.  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  MRA of the head high-grade proximal left P2 segment stenosis.  No other significant proximal stenosis aneurysm or branch vessel occlusion.  A follow-up MRI showed a stable 2.4 cm acute hemorrhage left thalamus with edema extending into the left cerebral peduncle and posterior limb left internal capsule.  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  Admission chemistries alcohol negative sodium 131 hemoglobin 11.6 urine drug screen negative.   Echocardiogram with ejection fraction of 60 to 65% no wall motion abnormalities grade 1 diastolic dysfunction.  Neurology follow-up close monitoring of blood pressure.  Patient was cleared to begin subcutaneous heparin for DVT prophylaxis.  Tolerating a regular diet.  Therapy evaluations completed and patient was admitted for a comprehensive rehab program.  Patient transferred to CIR on 12/14/2019 .   Patient currently requires max with mobility secondary to muscle weakness, decreased cardiorespiratoy endurance, impaired timing and sequencing, abnormal tone, unbalanced muscle activation, decreased coordination and decreased motor planning, decreased visual acuity, decreased visual perceptual skills and decreased visual motor skills, decreased midline orientation, decreased attention to right, right side neglect and decreased motor planning, decreased initiation, decreased attention, decreased awareness, decreased problem solving and decreased safety awareness and decreased sitting balance, decreased standing balance, decreased postural control, hemiplegia and decreased balance strategies.  Prior to hospitalization, patient was independent  with mobility and lived with Spouse, Family in a House home.  Home access is 4-5 1 railStairs to enter.  Patient will benefit from skilled PT intervention to maximize safe functional mobility, minimize fall risk and decrease caregiver burden for planned discharge home with 24 hour assist.  Anticipate patient will benefit from follow up [facility] at discharge.  PT - End of Session Activity Tolerance: Tolerates 30+ min activity with multiple rests Endurance Deficit: Yes PT Assessment Rehab Potential (ACUTE/IP ONLY): Good PT Barriers to Discharge: Inaccessible home environment;Behavior PT Barriers to Discharge Comments: New R hemi with R inattention and motor planning deficits, has 5 STE home with 1 rail, some self limitiing behaviors and anxiety with mobility PT Patient  demonstrates impairments in the following area(s): Balance;Behavior;Edema;Endurance;Motor;Nutrition;Pain;Perception;Safety;Sensory;Skin Integrity PT Transfers Functional Problem(s): Bed Mobility;Bed to Chair;Car;Furniture PT Locomotion Functional  Problem(s): Ambulation;Wheelchair Mobility;Stairs PT Plan PT Intensity: Minimum of 1-2 x/day ,45 to 90 minutes PT Frequency: 5 out of 7 days PT Duration Estimated Length of Stay: 2.5-3 weeks PT Treatment/Interventions: Ambulation/gait training;Balance/vestibular training;Cognitive remediation/compensation;Community reintegration;Discharge planning;Disease management/prevention;DME/adaptive equipment instruction;Functional mobility training;Pain management;Psychosocial support;Splinting/orthotics;Therapeutic Activities;UE/LE Strength taining/ROM;Visual/perceptual remediation/compensation;Wheelchair propulsion/positioning;UE/LE Coordination activities;Therapeutic Exercise;Stair training;Skin care/wound management;Patient/family education;Neuromuscular re-education;Functional electrical stimulation PT Transfers Anticipated Outcome(s): min A using LRAD PT Locomotion Anticipated Outcome(s): min A 50 ft using LRAD PT Recommendation Recommendations for Other Services: Neuropsych consult Follow Up Recommendations: Home health PT Patient destination: Home Equipment Recommended: To be determined Equipment Details: has SPC, TBD based on patient's progress   PT Evaluation Precautions/Restrictions Precautions Precautions: Fall Precaution Comments: R hemiparesis and incoordination, pushing to R in stand Restrictions Weight Bearing Restrictions: No Home Living/Prior Functioning Home Living Available Help at Discharge: Family;Available 24 hours/day Type of Home: House Home Access: Stairs to enter [employer] of Steps: 4-5 1 rail Home Layout: One level Additional Comments: Reports she lives with her husband, brother, and niece, need to confirm due  to discrepency with previous information in the chart  Lives With: Spouse;Family Prior Function Level of Independence: Independent with gait;Independent with transfers;Independent with homemaking with ambulation;Independent with basic ADLs  Able to Take Stairs?: Yes Vision/Perception  Vision - Assessment Eye Alignment: Impaired (comment) Ocular Range of Motion: Restricted on the right Alignment/Gaze Preference: Head turned Tracking/Visual Pursuits: Impaired - to be further tested in functional context (unable to follow cues for scanning R and head movement during testing) Diplopia Assessment: Disappears with one eye closed;Objects split side to side Perception Perception: Impaired Inattention/Neglect: Does not attend to right visual field;Does not attend to right side of body Praxis Praxis: Impaired Praxis Impairment Details: Motor planning  Cognition Overall Cognitive Status: Impaired/Different from baseline Arousal/Alertness: Awake/alert Orientation Level: Oriented X4 Attention: Sustained Focused Attention: Appears intact Sustained Attention: Appears intact Memory: Impaired Memory Impairment: Decreased short term memory Decreased Short Term Memory: Verbal basic;Functional basic Awareness: Appears intact Problem Solving: Impaired Problem Solving Impairment: Functional complex Safety/Judgment: Appears intact Sensation Sensation Light Touch: Impaired Detail Light Touch Impaired Details: Absent RLE;Absent RUE Proprioception: Impaired by gross assessment;Impaired Detail Proprioception Impaired Details: Absent RUE;Absent RLE Coordination Gross Motor Movements are Fluid and Coordinated: No Fine Motor Movements are Fluid and Coordinated: No Coordination and Movement Description: R hemi with R inattention Heel Shin Test: slow and deliberate on L, unable to locate shin with R foot without looking and poor motor control on R Motor  Motor Motor: Hemiplegia;Abnormal postural  alignment and control;Abnormal tone   Trunk/Postural Assessment  Cervical Assessment Cervical Assessment: Exceptions to [facility] (forward head) Thoracic Assessment Thoracic Assessment: Exceptions to [facility] (kyphosis) Lumbar Assessment Lumbar Assessment: Exceptions to [facility] (posterior pelvic tilt) Postural Control Postural Control: Deficits on evaluation (decreased/delayed)  Balance Balance Balance Assessed: Yes Static Sitting Balance Static Sitting - Balance Support: No upper extremity supported;Feet supported Static Sitting - Level of Assistance: 5: Stand by assistance Dynamic Sitting Balance Dynamic Sitting - Balance Support: No upper extremity supported;Feet supported Dynamic Sitting - Level of Assistance: 4: Min assist Dynamic Sitting - Balance Activities: Lateral lean/weight shifting;Forward lean/weight shifting Sitting balance - Comments: increased R and posterior lean with fatigue Static Standing Balance Static Standing - Balance Support: No upper extremity supported;During functional activity Static Standing - Level of Assistance: 3: Mod assist Dynamic Standing Balance Dynamic Standing - Balance Support: No upper extremity supported;During functional activity Dynamic Standing - Level of Assistance: 2: Max assist Extremity Assessment  RLE Assessment RLE Assessment:  Exceptions to [facility] Active Range of Motion (AROM) Comments: decreased active DF compared to L RLE Strength Right Hip Flexion: 4-/5 Right Knee Flexion: 4-/5 Right Knee Extension: 3+/5 Right Ankle Dorsiflexion: 3/5 Right Ankle Plantar Flexion: 3/5 RLE Tone RLE Tone: Mild;Hypertonic;Modified Ashworth Body Part - Modified Ashworth Scale: Gastrocnemius;Soleus GASTROCNEMIUS - Modified Ashworth Scale for Grading Hypertonia RLE: Slight increase in muscle tone, manifested by a catch, followed by minimal resistance throughout the remainder (less than half) of the ROM SOLEUS - Modified Ashworth Scale for Grading Hypertonia RLE:  Slight increase in muscle tone, manifested by a catch, followed by minimal resistance throughout the remainder (less than half) of the ROM RLE Tone Comments: equinovarus done with stepping and standing LLE Assessment LLE Assessment: Within Functional Limits Active Range of Motion (AROM) Comments: WFL for all functional mobility General Strength Comments: Grossly 5/5 throughout in sitting  Care Tool Care Tool Bed Mobility Roll left and right activity   Roll left and right assist level: Minimal Assistance - Patient > 75%    Sit to lying activity   Sit to lying assist level: Minimal Assistance - Patient > 75%    Lying to sitting edge of bed activity   Lying to sitting edge of bed assist level: Minimal Assistance - Patient > 75%     Care Tool Transfers Sit to stand transfer   Sit to stand assist level: Moderate Assistance - Patient 50 - 74%    Chair/bed transfer   Chair/bed transfer assist level: Maximal Assistance - Patient 25 - 49%     Toilet transfer    n/a    Geneticist, molecular transfer assist level: Moderate Assistance - Patient 50 - 74%      Care Tool Locomotion Ambulation   Assist level: Maximal Assistance - Patient 25 - 49% Assistive device: No Device Max distance: 6 ft  Walk 10 feet activity Walk 10 feet activity did not occur: Safety/medical concerns (R hemi, R pusher, decreased motor planning, R inattention)       Walk 50 feet with 2 turns activity Walk 50 feet with 2 turns activity did not occur: Safety/medical concerns      Walk 150 feet activity Walk 150 feet activity did not occur: Safety/medical concerns      Walk 10 feet on uneven surfaces activity Walk 10 feet on uneven surfaces activity did not occur: Safety/medical concerns      Stairs Stair activity did not occur: Safety/medical concerns (R hemi, R pusher, decreased motor planning, R inattention)        Walk up/down 1 step activity Walk up/down 1 step or curb (drop down) activity did not occur:  Safety/medical concerns        Walk up/down 4 steps activity   Walk up/down 4 steps activity did not occur: Safety/medical concerns    Walk up/down 12 steps activity Walk up/down 12 steps activity did not occur: Safety/medical concerns      Pick up small objects from floor Pick up small object from the floor (from standing position) activity did not occur: Safety/medical concerns (R hemi, R pusher, decreased motor planning, R inattention)      Wheelchair   Type of Wheelchair: Manual   Wheelchair assist level: Dependent - Patient 0% (patient stated she couldn't and did not know how, required skilled intervention to propel w/c with present deficits) Max wheelchair distance: >150 ft  Wheel 50 feet with 2 turns activity   Assist Level: Dependent - Patient 0%  Wheel 150  feet activity   Assist Level: Dependent - Patient 0%    Refer to Care Plan for Long Term Goals  SHORT TERM GOAL WEEK 1 PT Short Term Goal 1 (Week 1): Patient will perform bed mobility with supervision. PT Short Term Goal 2 (Week 1): Patient will perform basic transfers with mod A consistently. PT Short Term Goal 3 (Week 1): Patient will ambulate 25 ft with mod A using LRAD.  Recommendations for other services: Neuropsych  Skilled Therapeutic Intervention In addition to the PT evaluation above, the patient performed the following skilled PT interventions: Mobility Bed Mobility Bed Mobility: Rolling Right;Rolling Left;Supine to Sit;Sit to Supine Rolling Right: Supervision/verbal cueing (with HOB elevated 15 deg to simulate 5 pillows that she uses at home, no rails) Rolling Left: Minimal Assistance - Patient > 75% (assist for R arm due to decreased awareness, with HOB elevated 15 deg to simulate 5 pillows that she uses at home, no rails) Supine to Sit: Minimal Assistance - Patient > 75% (assist for R LE, trunk, and R UE manamgent, with HOB elevated 15 deg to simulate 5 pillows that she uses at home, no rails) Sit to  Supine: Minimal Assistance - Patient > 75% (assist for R LE, with HOB elevated 15 deg to simulate 5 pillows that she uses at home, no rails) Transfers Transfers: Sit to Stand;Stand to Sit;Stand Pivot Transfers Sit to Stand: Moderate Assistance - Patient 50-74% Stand to Sit: Moderate Assistance - Patient 50-74% Stand Pivot Transfers: Maximal Assistance - Patient 25 - 49% Stand Pivot Transfer Details: Manual facilitation for weight bearing;Manual facilitation for weight shifting;Manual facilitation for placement;Verbal cues for precautions/safety;Verbal cues for sequencing;Verbal cues for technique;Verbal cues for gait pattern Transfer (Assistive device): None (sit to stand x1 with the Stedy, patient with significant R lean causing device to move and PT to slide w/c under patient quickly for safety, may work better with +2 assist) Locomotion  Gait Ambulation: Yes Gait Assistance: Maximal Assistance - Patient 25-49% Gait Distance (Feet): 6 Feet Assistive device: None Gait Assistance Details: Tactile cues for initiation;Tactile cues for weight shifting;Tactile cues for placement;Tactile cues for sequencing;Tactile cues for posture;Tactile cues for weight beaing;Visual cues/gestures for precautions/safety;Verbal cues for precautions/safety;Manual facilitation for weight shifting;Manual facilitation for weight bearing;Manual facilitation for placement;Verbal cues for gait pattern Gait Gait: Yes Gait Pattern: Step-to pattern;Decreased stance time - left;Decreased hip/knee flexion - right;Decreased hip/knee flexion - left;Decreased dorsiflexion - right;Decreased weight shift to left;Wide base of support;Poor foot clearance - left;Poor foot clearance - right Gait velocity: decreased Stairs / Additional Locomotion Stairs: No Wheelchair Mobility Wheelchair Mobility: Yes Wheelchair Assistance: Supervision/Verbal cueing Wheelchair Propulsion: Left upper extremity;Left lower extremity Wheelchair Parts  Management: Needs assistance Distance: 55 ft Instructed pt in results of PT evaluation as detailed above, PT POC, rehab potential, rehab goals, and discharge recommendations. Additionally discussed CIR's policies regarding fall safety and use of chair alarm and/or quick release belt. Pt verbalized understanding and in agreement. Will update pt's family members as they become available.   Discharge Criteria: Patient will be discharged from PT if patient refuses treatment 3 consecutive times without medical reason, if treatment goals not met, if there is a change in medical status, if patient makes no progress towards goals or if patient is discharged from hospital.  The above assessment, treatment plan, treatment alternatives and goals were discussed and mutually agreed upon: by patient   L  PT, DPT  12/15/2019, 10:14 AM

## 2019-12-16 ENCOUNTER — Inpatient Hospital Stay (HOSPITAL_COMMUNITY): Payer: Self-pay

## 2019-12-16 ENCOUNTER — Inpatient Hospital Stay (HOSPITAL_COMMUNITY): Payer: Self-pay | Admitting: Speech Pathology

## 2019-12-16 DIAGNOSIS — E43 Unspecified severe protein-calorie malnutrition: Secondary | ICD-10-CM | POA: Insufficient documentation

## 2019-12-16 NOTE — Care Management (Signed)
[facility] Individual Statement of Services  Patient Name:  Melissa Colon  Date:  12/16/2019  Welcome to the [facility].  Our goal is to provide you with an individualized program based on your diagnosis and situation, designed to meet your specific needs.  With this comprehensive rehabilitation program, you will be expected to participate in at least 3 hours of rehabilitation therapies Monday-Friday, with modified therapy programming on the weekends.  Your rehabilitation program will include the following services:  Physical Therapy (PT), Occupational Therapy (OT), Speech Therapy (ST), 24 hour per day rehabilitation nursing, Therapeutic Recreaction (TR), Psychology, Neuropsychology, Care Coordinator, Rehabilitation Medicine, Nutrition Services, Pharmacy Services and Other  Weekly team conferences will be held on Tuesdays to discuss your progress.  Your Inpatient Rehabilitation Care Coordinator will talk with you frequently to get your input and to update you on team discussions.  Team conferences with you and your family in attendance may also be held.  Expected length of stay: 2.5-3 weeks   Overall anticipated outcome: Minimal Assistance  Depending on your progress and recovery, your program may change. Your Inpatient Rehabilitation Care Coordinator will coordinate services and will keep you informed of any changes. Your Inpatient Rehabilitation Care Coordinator's name and contact numbers are listed  below.  The following services may also be recommended but are not provided by the Inpatient [facility]:   Driving Evaluations  Home Health Rehabiltiation Services  Outpatient Rehabilitation Services  Vocational Rehabilitation   Arrangements will be made to provide these services after discharge if needed.  Arrangements include referral to agencies that provide these services.  Your insurance has been verified to be:   Uninsured  Your primary doctor is:  No PCP  Pertinent information will be shared with your doctor and your insurance company.  Inpatient Rehabilitation Care Coordinator:  Susie Cassette [phone] or [phone]  Information discussed with and copy given to patient by: Gretchen Short, 12/16/2019, 10:11 AM

## 2019-12-16 NOTE — Progress Notes (Signed)
Speech Language Pathology Daily Session Note  Patient Details  Name: Melissa Colon MRN: [id number] Date of Birth: November 26, 1960  Today's Date: 12/16/2019 SLP Individual Time: 1400-1455 SLP Individual Time Calculation (min): 55 min  Short Term Goals: Week 1: SLP Short Term Goal 1 (Week 1): Patient will recall new, daily information with Min verbal cues for use of memory compensatory strategies. SLP Short Term Goal 2 (Week 1): Patient will utilize speech intelligibility strategies at the sentence level ot achieve ~90% intelligibility with supervision verbal cues. SLP Short Term Goal 3 (Week 1): Patient will self-monitor and correct errors during verbal expression with overall supervision level verbal cues. SLP Short Term Goal 4 (Week 1): Patient will demonstrate functional problem solving for mildly complex tasks with Min verbal cues.  Skilled Therapeutic Interventions: Skilled treatment session focused on language and speech goals. SLP facilitated session by providing a novel verbal description task with focus on word-finding. Patient was overall Mod I for word-finding but required supervision verbal cues for specificity of task. Patient also required Min verbal cues to self-monitor and correct speech errors at the sentence level to maximize speech intelligibility with use of over-articulation to 90% accuracy. Patient left upright in bed with alarm on and all needs within reach. Continue with current plan of care.      Pain No/Denies Pain   Therapy/Group: Individual Therapy  ,  12/16/2019, 3:20 PM

## 2019-12-16 NOTE — Progress Notes (Signed)
Physical Therapy Session Note  Patient Details  Name: Melissa Colon MRN: [id number] Date of Birth: [birth year]  Today's Date: 12/16/2019 PT Individual Time: 1005-1100 PT Individual Time Calculation (min): 55 min   Short Term Goals: Week 1:  PT Short Term Goal 1 (Week 1): Patient will perform bed mobility with supervision. PT Short Term Goal 2 (Week 1): Patient will perform basic transfers with mod A consistently. PT Short Term Goal 3 (Week 1): Patient will ambulate 25 ft with mod A using LRAD.  Skilled Therapeutic Interventions/Progress Updates:     Patient in w/c in the room upon PT arrival. Patient alert and agreeable to PT session. Patient denied pain during session. Reported having a headache this morning that has since resolved.  Therapeutic Activity: Bed Mobility: Patient performed sit to supine with close supervision for safety. Provided cues for initiation and bringing her R knee to chest to lift her R leg onto the bed. Transfers: Patient performed sit to/from stand with min A of 1-2 people in preparation for gait training and stand pivot x2 with mod A. Provided verbal cues and facilitation for forward and L weight shift, sequencing for pivot and R foot placement due to increased inversion with tone.  Gait Training:  Trial 1: Patient ambulated 20 feet with max A +2 with 3 Musketeer technique with increased R trunk lean, heavy facilitation for L weight shift and initiation of R limb advancement with max A, and decreased R foot clearance with very limited step length Trial 2: Patient ambulated 28 feet with mod A of 1 person with +2 w/c follow holding a L rail with a mirror in front for visual feedback, with some improvement in R lean, increased initiation for R limb advancement with mod A, and improved weight shift Trial 3: Patient ambulated 28 feet with mod-min A of 1 person with +2 w/c follow holding L rail with a mirror in front for visual feedback, provided cues for maintaining L  shoulder and hip against the wall with significant improvement in trunk control, L weight shift, and reciprocal gait pattern with min A for L limb advancement/foot placement Trial 4: Patient ambulated 15 feet as previous trial with same cues with min A   Wheelchair Mobility:  Patient was transported in the w/c with total A throughout session for energy conservation and time management.  Neuromuscular Re-ed: Patient performed the following sitting and standing balance activities: -sitting balance with supervision working on midline orientation x2 min having patient mirror PT and move L shoulder toward a visual target -standing using 3 musketeers technique with mod A +2 working on midline orientation with cues to bring her L shoulder to therapist on the L and tactile and verbal cues for R knee/hip extension -standing holding L rail working on midline orientation with cues to over correct to leaning on the wall on the L then progress to midline 2x1 min progressed from [employer] supervision for standing balance  Educated patient on her progress with mobility and provided significant positive reinforcement throughout session to promote increased motivation for continuing with mobility.   Patient in bed at end of session with breaks locked, bed alarm set, and all needs within reach.    Therapy Documentation Precautions:  Precautions Precautions: Fall Precaution Comments: R hemiparesis and incoordination, pushing to R in stand Restrictions Weight Bearing Restrictions: No    Therapy/Group: Individual Therapy   L  PT, DPT  12/16/2019, 7:12 PM

## 2019-12-16 NOTE — Progress Notes (Signed)
[facility] PHYSICAL MEDICINE & REHABILITATION PROGRESS NOTE   Subjective/Complaints: Continues to have frequent BM- stopped senna-docusate.  States that she occasionally has some throbbing in left temple and tylenol helps Sleeping well at night  ROS: + multiple BM per day   Objective:   No results found. Recent Labs    12/14/19 0449 12/15/19 0721  WBC 5.5 6.7  HGB 11.3* 11.8*  HCT 35.1* 37.2  PLT 262 291   Recent Labs    12/14/19 0449 12/15/19 0721  NA 137 139  K 3.6 4.0  CL 104 105  CO2 22 23  GLUCOSE 91 91  BUN 13 16  CREATININE 0.49 0.58  CALCIUM 8.8* 9.2    Intake/Output Summary (Last 24 hours) at 12/16/2019 0941 Last data filed at 12/16/2019 0800 Gross per 24 hour  Intake 660 ml  Output 700 ml  Net -40 ml        Physical Exam: Vital Signs Blood pressure 131/75, pulse 81, temperature 97.6 F (36.4 C), resp. rate 16, height 5\' 2"  (1.575 m), SpO2 99 %.  Gen: no distress, normal appearing HEENT: oral mucosa pink and moist, left gaze preference Cardio: Reg rate Chest: normal effort, normal rate of breathing Abd: soft, non-distended Ext: no edema Skin: intact Neuro: Patient is alert in no acute distress.  R facial droop. She made eye contact with examiner and follows commands.  Provides name and age. Slow speech. RUE 3/4 SA, EE, EF, 4-/5 hand grip and WE RLE 3/5 HF, KE, 4/5 DF, PF Decreased sensation throughout right side.  Psych: Pt's affect is appropriate. Pt is cooperative  Assessment/Plan: 1. Functional deficits which require 3+ hours per day of interdisciplinary therapy in a comprehensive inpatient rehab setting.  Physiatrist is providing close team supervision and 24 hour management of active medical problems listed below.  Physiatrist and rehab team continue to assess barriers to discharge/monitor patient progress toward functional and medical goals  Care Tool:  Bathing    Body parts bathed by patient: Right arm, Left arm, Chest,  Abdomen, Front perineal area, Right upper leg, Left upper leg, Face   Body parts bathed by helper: Buttocks, Right lower leg, Left lower leg     Bathing assist Assist Level: Moderate Assistance - Patient 50 - 74%     Upper Body Dressing/Undressing Upper body dressing   What is the patient wearing?: Pull over shirt    Upper body assist Assist Level: Moderate Assistance - Patient 50 - 74%    Lower Body Dressing/Undressing Lower body dressing      What is the patient wearing?: Underwear/pull up     Lower body assist Assist for lower body dressing: Moderate Assistance - Patient 50 - 74%     Toileting Toileting    Toileting assist Assist for toileting: Maximal Assistance - Patient 25 - 49%     Transfers Chair/bed transfer  Transfers assist     Chair/bed transfer assist level: Maximal Assistance - Patient 25 - 49%     Locomotion Ambulation   Ambulation assist      Assist level: Maximal Assistance - Patient 25 - 49% Assistive device: No Device Max distance: 6 ft   Walk 10 feet activity   Assist  Walk 10 feet activity did not occur: Safety/medical concerns (R hemi, R pusher, decreased motor planning, R inattention)        Walk 50 feet activity   Assist Walk 50 feet with 2 turns activity did not occur: Safety/medical concerns  Walk 150 feet activity   Assist Walk 150 feet activity did not occur: Safety/medical concerns         Walk 10 feet on uneven surface  activity   Assist Walk 10 feet on uneven surfaces activity did not occur: Safety/medical concerns         Wheelchair     Assist   Type of Wheelchair: Manual    Wheelchair assist level: Dependent - Patient 0% (patient stated she couldn't and did not know how, required skilled intervention to propel w/c with present deficits) Max wheelchair distance: >150 ft    Wheelchair 50 feet with 2 turns activity    Assist        Assist Level: Dependent - Patient 0%    Wheelchair 150 feet activity     Assist      Assist Level: Dependent - Patient 0%   Blood pressure 131/75, pulse 81, temperature 97.6 F (36.4 C), resp. rate 16, height 5\' 2"  (1.575 m), SpO2 99 %.  Medical Problem List and Plan: 1.  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  Antithrombotics: -DVT/anticoagulation: Subcutaneous heparin             -antiplatelet therapy: N/A 3. Pain Management: Tylenol as needed- this is helping with occasional left sided headache 4. Mood: Provide emotional support             -antipsychotic agents: N/A 5. Neuropsych: This patient is capable of making decisions on her own behalf. 6. Skin/Wound Care: Routine skin checks 7. Fluids/Electrolytes/Nutrition: Routine in and outs with follow-up chemistries 8.  Hypertension.  Norvasc 5 mg daily.  Monitor with increased mobility. Well controlled. 9.  Tobacco abuse.  NicoDerm patch.  Provide counseling 10.  History of alcohol use.  Provide counseling. Continue folic acid supplement 11. Tachycardic to 103 on admission- monitor TID.   11/30-12/1: under good control 12. Urinary retention: Unable to void 11/30- required straight catheterization. RN reports urine was cloudy with foul order- UA/UC ordered 13. Acute blood loss anemia: Monitor Hgb weekly. 11.8 on 11/30 14. Multiple BM per day: stopped senna-docusate    LOS: 2 days A FACE TO FACE EVALUATION WAS PERFORMED  12/30  12/16/2019, 9:41 AM

## 2019-12-16 NOTE — Progress Notes (Signed)
Occupational Therapy Session Note  Patient Details  Name: Melissa Colon MRN: [id number] Date of Birth: Nov 27, 1960  Today's Date: 12/16/2019 OT Individual Time: 5597-4163 OT Individual Time Calculation (min): 75 min    Short Term Goals: Week 1:  OT Short Term Goal 1 (Week 1): Pt will don shirt with CGA OT Short Term Goal 2 (Week 1): Pt will require no more than min cueing for positioning of her RUE OT Short Term Goal 3 (Week 1): Pt will complete toilet transfer with mod A  Skilled Therapeutic Interventions/Progress Updates:    Pt received supine with no c/o pain, ready for OT session. Pt declined ADLs this morning. Pt completed bed mobility with CGA with min cueing. Pt completed squat pivot transfer to the w/c toward the L with mod A, after demo and mod cueing for technique. Pt was taken to the therapy gym via w/c. Orthosis added to pt's RW. Pt completed standing activity focused on improving midline orientation, reducing R pushing and promoting weightbearing through the R and L LE. A mirror was placed in front of pt for feedback 2/2 sensory deficits and for midline orientation. A ball was placed between wall and pt's L shoulder to provide sensory and visual feedback and reduce OT cueing. Pt did very well with this activity, requiring min A to remain standing and no fall of the ball. Second trial graded up with removing the RUE from the RW and reaching forward, holding marker and Ot facilitating wrist extension and supination to draw several lines across mirror. Pt then reached across midline with trunk rotation to touch wall her LUE was resting against. Only min cueing required for midline orientation throughout this activity! From seated pt then completed flat hand wiping of the mirror with min facilitation required for shoulder flexion and elbow extension. Pt then completed standing activity with no feedback for midline orientation and required considerably more support for R pushing. She  required mod facilitation for knee blocking and prevention of R foot inversion. Pt completed R hand/arm AROM over a ball with mod HOH provided to support weightbearing. Pt was given a foam block for independent exercise in the room. Pt was taken back to her room and left sitting up with all needs met, chair alarm set.   Therapy Documentation Precautions:  Precautions Precautions: Fall Precaution Comments: R hemiparesis and incoordination, pushing to R in stand Restrictions Weight Bearing Restrictions: No   Therapy/Group: Individual Therapy  Curtis Sites 12/16/2019, 7:23 AM

## 2019-12-17 ENCOUNTER — Inpatient Hospital Stay (HOSPITAL_COMMUNITY): Payer: Self-pay

## 2019-12-17 ENCOUNTER — Inpatient Hospital Stay (HOSPITAL_COMMUNITY): Payer: Self-pay | Admitting: Speech Pathology

## 2019-12-17 DIAGNOSIS — A499 Bacterial infection, unspecified: Secondary | ICD-10-CM

## 2019-12-17 DIAGNOSIS — I10 Essential (primary) hypertension: Secondary | ICD-10-CM

## 2019-12-17 DIAGNOSIS — D62 Acute posthemorrhagic anemia: Secondary | ICD-10-CM

## 2019-12-17 DIAGNOSIS — N390 Urinary tract infection, site not specified: Secondary | ICD-10-CM

## 2019-12-17 LAB — URINE CULTURE: Culture: >=100000 — AB

## 2019-12-17 MED ORDER — AMOXICILLIN 250 MG PO CAPS
250.0000 mg | ORAL_CAPSULE | Freq: Three times a day (TID) | ORAL | Status: AC
  Administered 2019-12-17 – 2019-12-19 (×9): 250 mg via ORAL
  Filled 2019-12-17 (×9): qty 1

## 2019-12-17 NOTE — Progress Notes (Signed)
Speech Language Pathology Daily Session Note  Patient Details  Name: Mazie Fencl MRN: [id number] Date of Birth: 06/01/1960  Today's Date: 12/17/2019 SLP Individual Time: 1000-1055 SLP Individual Time Calculation (min): 55 min  Short Term Goals: Week 1: SLP Short Term Goal 1 (Week 1): Patient will recall new, daily information with Min verbal cues for use of memory compensatory strategies. SLP Short Term Goal 2 (Week 1): Patient will utilize speech intelligibility strategies at the sentence level ot achieve ~90% intelligibility with supervision verbal cues. SLP Short Term Goal 3 (Week 1): Patient will self-monitor and correct errors during verbal expression with overall supervision level verbal cues. SLP Short Term Goal 4 (Week 1): Patient will demonstrate functional problem solving for mildly complex tasks with Min verbal cues.  Skilled Therapeutic Interventions: Skilled treatment session focused on cognitive goals. SLP facilitated session by providing extra time and Min A verbal and visual cues for functional problem solving during a basic money management task. Mod A verbal cues were needed for a basic medication management task while utilizing a pill chart, suspect due to it being a novel task. Patient left upright in bed with alarm on and all needs within reach. Continue with current plan of care.      Pain No/Denies Pain   Therapy/Group: Individual Therapy  ,  12/17/2019, 12:52 PM

## 2019-12-17 NOTE — Progress Notes (Signed)
Occupational Therapy Session Note  Patient Details  Name: Melissa Colon MRN: [id number] Date of Birth: 1960-06-29  Today's Date: 12/17/2019 OT Individual Time: 0800-0900 OT Individual Time Calculation (min): 60 min    Short Term Goals: Week 1:  OT Short Term Goal 1 (Week 1): Pt will don shirt with CGA OT Short Term Goal 2 (Week 1): Pt will require no more than min cueing for positioning of her RUE OT Short Term Goal 3 (Week 1): Pt will complete toilet transfer with mod A  Skilled Therapeutic Interventions/Progress Updates:    1:1. Pt received in bed agreeable to OT with no pain. Premedicated. Pt changes shirt with CGA for sitting balance and L lean and MIN A to doff/don shirt with MIN VC fo rhemi technique. MOD A squat pivot to/from bed with L knee block. Pt requesitn to work on [employer] and practices reaching at tabletop with focus on grasp/release of bean bags/pegs with step by step cuing for exaggerating movements with ~3 sec delay for pt to execute movement distally. MOD A for maintaining shoulder flexion/abduction when reaching d/t weakness. Pt sit to stand at table with mirror for visual feedback with pt placingL hand on target to improve midline orientation/pushing with pt eventually able to hold midline with MIN A/L knee block. Exited session with pt seated in bed, exit alarm on and call light in reach   Therapy Documentation Precautions:  Precautions Precautions: Fall Precaution Comments: R hemiparesis and incoordination, pushing to R in stand Restrictions Weight Bearing Restrictions: No General:   Vital Signs: Therapy Vitals Temp: 98.1 F (36.7 C) Temp Source: Oral Pulse Rate: 84 Resp: 16 BP: 117/73 Patient Position (if appropriate): Sitting Oxygen Therapy SpO2: 100 % O2 Device: Room Air Pain: Pain Assessment Pain Scale: 0-10 Pain Score: 0-No pain Pain Type: Acute pain Pain Location: Head Pain Intervention(s): Medication (See eMAR) ADL: ADL Eating: Minimal  assistance, Minimal cueing Where Assessed-Eating: Bed level Grooming: Setup Where Assessed-Grooming: Sitting at sink Upper Body Bathing: Minimal assistance, Minimal cueing Where Assessed-Upper Body Bathing: Edge of bed Lower Body Bathing: Moderate assistance Where Assessed-Lower Body Bathing: Edge of bed Upper Body Dressing: Minimal assistance, Minimal cueing Where Assessed-Upper Body Dressing: Edge of bed Lower Body Dressing: Moderate assistance, Minimal cueing Where Assessed-Lower Body Dressing: Edge of bed Toileting: Maximal assistance, Minimal cueing Where Assessed-Toileting: Bedside Commode Toilet Transfer: Maximal assistance Toilet Transfer Method: Stand pivot Toilet Transfer Equipment: Drop arm bedside commode Vision   Perception    Praxis   Exercises:   Other Treatments:     Therapy/Group: Individual Therapy  Shon Hale 12/17/2019, 6:51 AM

## 2019-12-17 NOTE — IPOC Note (Signed)
Overall Plan of Care [facility], [place]) Patient Details Name: Melissa Colon MRN: [id number] DOB: [birth year]  Admitting Diagnosis: ICH (intracerebral hemorrhage) [facility])  Hospital Problems: Principal Problem:   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 Active Problems:   Protein-calorie malnutrition, severe     Functional Problem List: Nursing Endurance, Medication Management, Nutrition, Sensory, Safety, Pain  PT Balance, Behavior, Edema, Endurance, Motor, Nutrition, Pain, Perception, Safety, Sensory, Skin Integrity  OT Balance, Perception, Safety, Cognition, Sensory, Endurance, Motor, Vision  SLP Cognition, Linguistic  TR         Basic ADL's: OT Eating, Grooming, Dressing, Bathing, Toileting     Advanced  ADL's: OT       Transfers: PT Bed Mobility, Bed to Chair, Car, Occupational psychologist, Research scientist (life sciences): PT Ambulation, Psychologist, prison and probation services, Stairs     Additional Impairments: OT Fuctional Use of Upper Extremity  SLP Communication, Social Cognition expression Memory, Problem Solving  TR      Anticipated Outcomes Item Anticipated Outcome  Self Feeding (S)  Swallowing      Basic self-care  CGA  Toileting  CGA   Bathroom Transfers CGA  Bowel/Bladder  Min assist  Transfers  min A using LRAD  Locomotion  min A 50 ft using LRAD  Communication  Mod I  Cognition  Supervision  Pain  <3 on a 0-10 pain scale  Safety/Judgment  Min assist   Therapy Plan: PT Intensity: Minimum of 1-2 x/day ,45 to 90 minutes PT Frequency: 5 out of 7 days PT Duration Estimated Length of Stay: 2.5-3 weeks OT Intensity: Minimum of 1-2 x/day, 45 to 90 minutes OT Frequency: 5 out of 7 days OT Duration/Estimated Length of Stay: 2.5- 3 weeks SLP Intensity: Minumum of 1-2 x/day, 30 to 90 minutes SLP Frequency: 3 to 5 out of 7 days SLP Duration/Estimated Length of Stay: 2.5-3 weeks   Due to the current state of emergency, patients may not be receiving their 3-hours  of Medicare-mandated therapy.   Team Interventions: Nursing Interventions Patient/Family Education, Disease Management/Prevention, Medication Management, Discharge Planning, Psychosocial Support  PT interventions Ambulation/gait training, Warden/ranger, Cognitive remediation/compensation, Community reintegration, Discharge planning, Disease management/prevention, DME/adaptive equipment instruction, Functional mobility training, Pain management, Psychosocial support, Splinting/orthotics, Therapeutic Activities, UE/LE Strength taining/ROM, Visual/perceptual remediation/compensation, Wheelchair propulsion/positioning, UE/LE Coordination activities, Therapeutic Exercise, Stair training, Skin care/wound management, Patient/family education, Neuromuscular re-education, Functional electrical stimulation  OT Interventions Balance/vestibular training, Discharge planning, Functional electrical stimulation, Pain management, Self Care/advanced ADL retraining, Therapeutic Activities, UE/LE Coordination activities, Visual/perceptual remediation/compensation, Therapeutic Exercise, Patient/family education, Functional mobility training, Disease mangement/prevention, Cognitive remediation/compensation, Community reintegration, Fish farm manager, Psychosocial support, Neuromuscular re-education, Splinting/orthotics, UE/LE Strength taining/ROM, Wheelchair propulsion/positioning  SLP Interventions Cognitive remediation/compensation, Internal/external aids, Speech/Language facilitation, Therapeutic Activities, Environmental controls, Cueing hierarchy, Functional tasks, Patient/family education  TR Interventions    SW/CM Interventions Discharge Planning, Patient/Family Education, Psychosocial Support   Barriers to Discharge MD  Medical stability  Nursing Inaccessible home environment, Decreased caregiver support, Home environment access/layout, Lack of/limited family support, Medication  compliance    PT Inaccessible home environment, Behavior New R hemi with R inattention and motor planning deficits, has 5 STE home with 1 rail, some self limitiing behaviors and anxiety with mobility  OT Inaccessible home environment stairs to enter  SLP      SW       Team Discharge Planning: Destination: PT-Home ,OT- Home , SLP-Home Projected Follow-up: PT-Home health PT, OT-  Home health OT, SLP-Home Health SLP, 24 hour supervision/assistance Projected  Equipment Needs: PT-To be determined, OT- To be determined, SLP-None recommended by SLP Equipment Details: PT-has SPC, TBD based on patient's progress, OT-  Patient/family involved in discharge planning: PT- Patient,  OT-Patient, SLP-Patient  MD ELOS: 17-21 days Medical Rehab Prognosis:  Excellent Assessment: The patient has been admitted for CIR therapies with the diagnosis of left thalamic hemorrhage. The team will be addressing functional mobility, strength, stamina, balance, safety, adaptive techniques and equipment, self-care, bowel and bladder mgt, patient and caregiver education, NMR, pain mgt, community reentry, cognition, communication. Goals have been set at min assist to CGA with self-care and mobility and mod I/supervision with cognition and communication.   Due to the current state of emergency, patients may not be receiving their 3 hours per day of Medicare-mandated therapy.    Ranelle Oyster, MD, FAAPMR      See Team Conference Notes for weekly updates to the plan of care

## 2019-12-17 NOTE — Progress Notes (Signed)
[facility] PHYSICAL MEDICINE & REHABILITATION PROGRESS NOTE   Subjective/Complaints: Happy that bowels have slowed down. Really no complaints this morning other than a mild headache once and while which tylenol treats. Up with OT  ROS: Patient denies fever, rash, sore throat, blurred vision, nausea, vomiting, diarrhea, cough, shortness of breath or chest pain, joint or back pain,   or mood change.    Objective:   No results found. Recent Labs    12/15/19 0721  WBC 6.7  HGB 11.8*  HCT 37.2  PLT 291   Recent Labs    12/15/19 0721  NA 139  K 4.0  CL 105  CO2 23  GLUCOSE 91  BUN 16  CREATININE 0.58  CALCIUM 9.2    Intake/Output Summary (Last 24 hours) at 12/17/2019 0916 Last data filed at 12/17/2019 0731 Gross per 24 hour  Intake 535 ml  Output 425 ml  Net 110 ml        Physical Exam: Vital Signs Blood pressure 117/73, pulse 84, temperature 98.1 F (36.7 C), temperature source Oral, resp. rate 16, height 5\' 2"  (1.575 m), SpO2 100 %.  Constitutional: No distress . Vital signs reviewed. HEENT: EOMI, oral membranes moist Neck: supple Cardiovascular: RRR without murmur. No JVD    Respiratory/Chest: CTA Bilaterally without wheezes or rales. Normal effort    GI/Abdomen: BS +, non-tender, non-distended Ext: no clubbing, cyanosis, or edema Psych: pleasant and cooperative, sl flat Skin: intact Neuro: Patient is alert in no acute distress.  R facial droop. She made eye contact with examiner and follows commands. Dysconjugate gaze.  Provides name and age. Slow speech. RUE 3- SA, EE, EF, 4-/5 hand grip and WE RLE 3+/5 HF, KE, 4/5 DF, PF Decreased sensation throughout right side to LT/PP.     Assessment/Plan: 1. Functional deficits which require 3+ hours per day of interdisciplinary therapy in a comprehensive inpatient rehab setting.  Physiatrist is providing close team supervision and 24 hour management of active medical problems listed below.  Physiatrist and rehab  team continue to assess barriers to discharge/monitor patient progress toward functional and medical goals  Care Tool:  Bathing    Body parts bathed by patient: Right arm, Left arm, Chest, Abdomen, Front perineal area, Right upper leg, Left upper leg, Face   Body parts bathed by helper: Buttocks, Right lower leg, Left lower leg     Bathing assist Assist Level: Moderate Assistance - Patient 50 - 74%     Upper Body Dressing/Undressing Upper body dressing   What is the patient wearing?: Pull over shirt    Upper body assist Assist Level: Moderate Assistance - Patient 50 - 74%    Lower Body Dressing/Undressing Lower body dressing      What is the patient wearing?: Incontinence brief, Pants     Lower body assist Assist for lower body dressing: Maximal Assistance - Patient 25 - 49%     Toileting Toileting    Toileting assist Assist for toileting: Maximal Assistance - Patient 25 - 49%     Transfers Chair/bed transfer  Transfers assist     Chair/bed transfer assist level: Moderate Assistance - Patient 50 - 74%     Locomotion Ambulation   Ambulation assist      Assist level: Moderate Assistance - Patient 50 - 74% Assistive device: Other (comment) (L hand rail) Max distance: 28 ft   Walk 10 feet activity   Assist  Walk 10 feet activity did not occur: Safety/medical concerns (R hemi, R pusher,  decreased motor planning, R inattention)  Assist level: Moderate Assistance - Patient - 50 - 74%     Walk 50 feet activity   Assist Walk 50 feet with 2 turns activity did not occur: Safety/medical concerns         Walk 150 feet activity   Assist Walk 150 feet activity did not occur: Safety/medical concerns         Walk 10 feet on uneven surface  activity   Assist Walk 10 feet on uneven surfaces activity did not occur: Safety/medical concerns         Wheelchair     Assist   Type of Wheelchair: Manual    Wheelchair assist level: Dependent  - Patient 0% (patient stated she couldn't and did not know how, required skilled intervention to propel w/c with present deficits) Max wheelchair distance: >150 ft    Wheelchair 50 feet with 2 turns activity    Assist        Assist Level: Dependent - Patient 0%   Wheelchair 150 feet activity     Assist      Assist Level: Dependent - Patient 0%   Blood pressure 117/73, pulse 84, temperature 98.1 F (36.7 C), temperature source Oral, resp. rate 16, height 5\' 2"  (1.575 m), SpO2 100 %.  Medical Problem List and Plan: 1.  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  Antithrombotics: -DVT/anticoagulation: Subcutaneous heparin             -antiplatelet therapy: N/A 3. Pain Management: Tylenol as needed- this is helping with occasional left sided headache 4. Mood: Provide emotional support             -antipsychotic agents: N/A 5. Neuropsych: This patient is capable of making decisions on her own behalf. 6. Skin/Wound Care: Routine skin checks 7. Fluids/Electrolytes/Nutrition: Routine in and outs with follow-up chemistries 8.  Hypertension.  Norvasc 5 mg daily.  Monitor with increased mobility. Well controlled. 9.  Tobacco abuse.  NicoDerm patch.  Provide counseling 10.  History of alcohol use.  Provide counseling. Continue folic acid supplement 11. Tachycardic to 103 on admission- monitor TID.   12/2 resolved 12. Urinary retention: pvr's generally less than 200, urine still cloudy  12/2-ucx with 100k aerococcus urinae.    -will treat empirically with 3 days amoxicillin 13. Acute blood loss anemia: Monitor Hgb weekly. 11.8 on 11/30 14. Multiple BM per day: stopped senna-docusate--improved    LOS: 3 days A FACE TO FACE EVALUATION WAS  PERFORMED  12/30 12/17/2019, 9:16 AM

## 2019-12-17 NOTE — Progress Notes (Signed)
Physical Therapy Session Note  Patient Details  Name: Melissa Colon MRN: [id number] Date of Birth: 1960-03-16  Today's Date: 12/17/2019 PT Individual Time: 1300-1415 PT Individual Time Calculation (min): 75 min   Short Term Goals: Week 1:  PT Short Term Goal 1 (Week 1): Patient will perform bed mobility with supervision. PT Short Term Goal 2 (Week 1): Patient will perform basic transfers with mod A consistently. PT Short Term Goal 3 (Week 1): Patient will ambulate 25 ft with mod A using LRAD.  Skilled Therapeutic Interventions/Progress Updates:     Patient in bed upon PT arrival. Patient alert and agreeable to PT session. Patient denied pain during session.  Therapeutic Activity: Bed Mobility: Patient performed supine to sit with supervision and sit to supine with min A for R lower extremity management in a flat bed without use of bed rails. Provided verbal cues for bringing her knee to chest to lift her leg onto the bed. Transfers: Patient performed sit to/from stand with mod-min A with a L rail x3 and B rails in the // bars x5. Provided verbal cues for R foot placement, forward weight shift, visual target for L hip to reduce pushing R, and facilitation for L hand grip on // bar. She performed stand pivot x2 with min-mod A with improved standing balance today. Facilitated weight shift and patient able to lift B feet for stepping while blocking R knee and facilitating foot placement due to ankle inversion with stepping.  Gait Training:  Patient ambulated 28 feet x2 using L hand rail with mod A for trunk support and min A for L limb advancement on first trial and min A for trunk support and no assist for limb advancement on second trial following NMR, see below. Ambulated with step-to progressing to step-through gait pattern, decreased R hip/knee flexion and DF with increased ankle inversion with swing, decreased R knee control during stance with increased flexion or extensor thrust without  blocking, forward and R trunk flexion/lean, and decreased L weight shift. Provided verbal cues and facilitation for trunk elongation, weight shift to L wall when stepping with R foot, increased R hamstring activation in pre-swing, and facilitated R weight shift to increased L step length.  Wheelchair Mobility:  Patient was transported in the w/c with total A throughout session for energy conservation and time management.  Neuromuscular Re-ed: Patient performed the following standing balance and R lower extremity motor control activities: -standing balance with L rail with cues to lean against the rail/wall on her L to overcompensate for R lean/pushing and progressing to standing in midline -standing in // bars 2x1-2 minholding with B upper extremities, progressing from R lean to leaning against L bar with cues, then midline, progressing from min-mod A for balance and blocking R knee to supervision with cues for R knee/hip/tunk/elbow extension with cues to push to the L bar followed by cues to ease of pushing L to find midline -step taps to 6" step in // bars R/L 2x10 focused on hamstring and hip flexor activation with R taps and R weight shift without pushing R or locking out in hyperextension in stance when L taps, progressed from mod-min A for balance and mod A for R foot placement and max A for R knee control to min A for balance and independent R foot placement and min A for R knee control  Patient in bed at end of session with breaks locked, bed alarm set, and all needs within reach.    Therapy Documentation  Precautions:  Precautions Precautions: Fall Precaution Comments: R hemiparesis and incoordination, pushing to R in stand Restrictions Weight Bearing Restrictions: No   Therapy/Group: Individual Therapy   L  PT, DPT  12/17/2019, 6:02 PM

## 2019-12-18 ENCOUNTER — Inpatient Hospital Stay (HOSPITAL_COMMUNITY): Payer: Self-pay

## 2019-12-18 ENCOUNTER — Inpatient Hospital Stay (HOSPITAL_COMMUNITY): Payer: Self-pay | Admitting: Occupational Therapy

## 2019-12-18 NOTE — Progress Notes (Signed)
[facility] PHYSICAL MEDICINE & REHABILITATION PROGRESS NOTE   Subjective/Complaints: No problems this morning. A little sleepy but denies pain.   ROS: Patient denies fever, rash, sore throat, blurred vision, nausea, vomiting, diarrhea, cough, shortness of breath or chest pain, joint or back pain, headache, or mood change.   Objective:   No results found. No results for input(s): WBC, HGB, HCT, PLT in the last 72 hours. No results for input(s): NA, K, CL, CO2, GLUCOSE, BUN, CREATININE, CALCIUM in the last 72 hours.  Intake/Output Summary (Last 24 hours) at 12/18/2019 1026 Last data filed at 12/18/2019 0715 Gross per 24 hour  Intake 477 ml  Output 420 ml  Net 57 ml        Physical Exam: Vital Signs Blood pressure 107/66, pulse 80, temperature 98.3 F (36.8 C), temperature source Oral, resp. rate 18, height 5\' 2"  (1.575 m), weight 47.2 kg, SpO2 95 %.  Constitutional: No distress . Vital signs reviewed. HEENT: EOMI, oral membranes moist Neck: supple Cardiovascular: RRR without murmur. No JVD    Respiratory/Chest: CTA Bilaterally without wheezes or rales. Normal effort    GI/Abdomen: BS +, non-tender, non-distended Ext: no clubbing, cyanosis, or edema Psych: pleasant, flat Skin: intact Neuro: reasonably alert. R facial droop.  Dysconjugate gaze.  Provides name and age. Slow, measured speech. RUE 3- SA, EE, EF, 4-/5 hand grip and WE RLE 3+/5 HF, KE, 4/5 DF, PF Decreased sensation throughout right side to LT/PP.     Assessment/Plan: 1. Functional deficits which require 3+ hours per day of interdisciplinary therapy in a comprehensive inpatient rehab setting.  Physiatrist is providing close team supervision and 24 hour management of active medical problems listed below.  Physiatrist and rehab team continue to assess barriers to discharge/monitor patient progress toward functional and medical goals  Care Tool:  Bathing    Body parts bathed by patient: Right arm, Left arm,  Chest, Abdomen, Front perineal area, Right upper leg, Left upper leg, Face   Body parts bathed by helper: Buttocks, Right lower leg, Left lower leg     Bathing assist Assist Level: Moderate Assistance - Patient 50 - 74%     Upper Body Dressing/Undressing Upper body dressing   What is the patient wearing?: Pull over shirt    Upper body assist Assist Level: Moderate Assistance - Patient 50 - 74%    Lower Body Dressing/Undressing Lower body dressing      What is the patient wearing?: Incontinence brief, Pants     Lower body assist Assist for lower body dressing: Maximal Assistance - Patient 25 - 49%     Toileting Toileting    Toileting assist Assist for toileting: Maximal Assistance - Patient 25 - 49%     Transfers Chair/bed transfer  Transfers assist     Chair/bed transfer assist level: Moderate Assistance - Patient 50 - 74%     Locomotion Ambulation   Ambulation assist      Assist level: Moderate Assistance - Patient 50 - 74% Assistive device: Other (comment) (L hand rail) Max distance: 28 ft   Walk 10 feet activity   Assist  Walk 10 feet activity did not occur: Safety/medical concerns (R hemi, R pusher, decreased motor planning, R inattention)  Assist level: Moderate Assistance - Patient - 50 - 74%     Walk 50 feet activity   Assist Walk 50 feet with 2 turns activity did not occur: Safety/medical concerns         Walk 150 feet activity  Assist Walk 150 feet activity did not occur: Safety/medical concerns         Walk 10 feet on uneven surface  activity   Assist Walk 10 feet on uneven surfaces activity did not occur: Safety/medical concerns         Wheelchair     Assist Will patient use wheelchair at discharge?: Yes Type of Wheelchair: Manual    Wheelchair assist level: Dependent - Patient 0% (patient stated she couldn't and did not know how, required skilled intervention to propel w/c with present deficits) Max  wheelchair distance: >150 ft    Wheelchair 50 feet with 2 turns activity    Assist        Assist Level: Dependent - Patient 0%   Wheelchair 150 feet activity     Assist      Assist Level: Dependent - Patient 0%   Blood pressure 107/66, pulse 80, temperature 98.3 F (36.8 C), temperature source Oral, resp. rate 18, height 5\' 2"  (1.575 m), weight 47.2 kg, SpO2 95 %.  Medical Problem List and Plan: 1.  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  Antithrombotics: -DVT/anticoagulation: Subcutaneous heparin             -antiplatelet therapy: N/A 3. Pain Management: Tylenol as needed- this is helping with occasional left sided headache 4. Mood: Provide emotional support             -antipsychotic agents: N/A 5. Neuropsych: This patient is capable of making decisions on her own behalf. 6. Skin/Wound Care: Routine skin checks 7. Fluids/Electrolytes/Nutrition: Routine in and outs with follow-up chemistries 8.  Hypertension.  Norvasc 5 mg daily.    12/3 controlled. 9.  Tobacco abuse.  NicoDerm patch.  Provide counseling 10.  History of alcohol use.  Provide counseling. Continue folic acid supplement 11. Tachycardic to 103 on admission- monitor TID.   12/2 resolved 12. Urinary retention: pvr's generally less than 200, urine still cloudy  12/2-ucx with 100k aerococcus urinae.    -amoxil 12/2-12/4 13. Acute blood loss anemia: Monitor Hgb weekly. 11.8 on 11/30 14. Multiple BM per day: stopped senna-docusate--formed stool 12/3    LOS: 4 days A FACE TO FACE EVALUATION WAS PERFORMED  14/3 12/18/2019, 10:26 AM

## 2019-12-18 NOTE — Progress Notes (Signed)
Physical Therapy Session Note  Patient Details  Name: Melissa Colon MRN: [id number] Date of Birth: 20-Oct-1960  Today's Date: 12/18/2019 PT Individual Time: 4656-8127 PT Individual Time Calculation (min): 42 min   Short Term Goals: Week 1:  PT Short Term Goal 1 (Week 1): Patient will perform bed mobility with supervision. PT Short Term Goal 2 (Week 1): Patient will perform basic transfers with mod A consistently. PT Short Term Goal 3 (Week 1): Patient will ambulate 25 ft with mod A using LRAD.  Skilled Therapeutic Interventions/Progress Updates:    Patient received supine in bed, agreeable to PT. She denies pain, but endorses fatigue. Supervision provided for supine > edge of bed with HOB elevated and use of bed rails. ModA squat pivot to wc leading L with evidence of R LE extensor tone complicating transfer. Patient requesting to use bathroom. MaxA stand pivot to toilet with ModA clothing management in standing/ completed in sitting. Patient pushing to the R with L LE in standing when trying to pull down pants. Persistent R lateral lean when sitting on commode requiring consistent CGA from PT for improved postural control. Patient able to void in toilet. MaxA clothing management in standing with MaxA stand pivot back to wc. PT propelling patient in wc to therapy gym for time management and energy conservation. Patient able to transfer to therapy mat via squat pivot with ModA. Dynamic sitting balance task completed with mirror for visual feedback on midline orientation/posture. Patient able to reach outside BOS with L UE initially, crossing midline, and then R UE with limited grasp noted. Patient able to maintain appropriate posture throughout task despite increasing fatigue. Patient transferring back to wc via squat pivot with ModA. She was able to stand in parallel bars with ModA and R knee blocking. B UE support on bars. With standing, R LE moving into increased extensor tone resulting in genu  recurvatum. Patient attempting to take step with R LE. R ankle inversion noted in stance with poor patient awareness of this due to decreased proprioception. Patient unable to accept weight onto R LE to advance L LE at this time. Patient returning to room in wc, seatbelt alarm on, call light within reach.   Therapy Documentation Precautions:  Precautions Precautions: Fall Precaution Comments: R hemiparesis and incoordination, pushing to R in stand Restrictions Weight Bearing Restrictions: No    Therapy/Group: Individual Therapy  Elizebeth Koller, PT, DPT, CBIS  12/18/2019, 7:52 AM

## 2019-12-18 NOTE — Progress Notes (Signed)
Occupational Therapy Session Note  Patient Details  Name: Melissa Colon MRN: [id number] Date of Birth: 04-Jul-1960  Today's Date: 12/18/2019 OT Individual Time: 2542-7062 OT Individual Time Calculation (min): 59 min    Short Term Goals: Week 1:  OT Short Term Goal 1 (Week 1): Pt will don shirt with CGA OT Short Term Goal 2 (Week 1): Pt will require no more than min cueing for positioning of her RUE OT Short Term Goal 3 (Week 1): Pt will complete toilet transfer with mod A  Skilled Therapeutic Interventions/Progress Updates:    Pt received in w/c with safety belt alarm engaged. Pt son present at start and end of session  Toileting: Stand-pivot to toilet with max A for RUE placement, RLE block, and BLE weight shift. Pt req close supervision for posterior peri care but max A for clothing management.   NMR: Stand-pivot from w/c to mat with max A. Maintains static sitting balance with close supervision due 2 R hemiparesis. Targeted RUE WB by practicing R and L lateral trunk flexion in prep for improved bed mobility. Pt req mod A for R elbow extension to push up back to upright position when RUE is placed ~2' away from pelvis. Retrieved clothes pins from L side of body to place on R side of bedside table while maintaining weightbearing through the LUE. Req mod A for R elbow flexion to return to upright position + RLE block upon trunk flexion. Pt initiates/closes R grasp with min VCs for initiation, but cont to have difficulty activating  grasp during functional tasks. Req active assist from LUE to facilitate R shoulder flexion above ~80 degrees.  Squat-pivot  to w/c from mat with max A. Squat-pivot from wc to bed with max A to pivot hips, RLE block, and to facilitate forward trunk flexion.  Pt left in bed with bed alarm engaged, call bell in reach, son present.   Therapy Documentation Precautions:  Precautions Precautions: Fall Precaution Comments: R hemiparesis and incoordination, pushing  to R in stand Restrictions Weight Bearing Restrictions: No Pain: Pain Assessment Pain Scale: 0-10 Pain Score: 0-No pain ADL: See Care Tool for more details.  Therapy/Group: Individual Therapy  Claudie Revering MS, OTR/L  12/18/2019, 3:21 PM

## 2019-12-18 NOTE — Progress Notes (Signed)
Occupational Therapy Session Note  Patient Details  Name: Melissa Colon MRN: [id number] Date of Birth: 14-Feb-1960  Today's Date: 12/18/2019 OT Individual Time: 4970-2637 OT Individual Time Calculation (min): 75 min    Short Term Goals: Week 1:  OT Short Term Goal 1 (Week 1): Pt will don shirt with CGA OT Short Term Goal 2 (Week 1): Pt will require no more than min cueing for positioning of her RUE OT Short Term Goal 3 (Week 1): Pt will complete toilet transfer with mod A  Skilled Therapeutic Interventions/Progress Updates:     Pt received in bed with no pain.  ADL:  Pt completes bathing with MOD A for washing buttocks, HOH A to wash L arm/pit, and A to maintain LE positioned in figure 4; CGA sitting balance intermittently Pt completes UB dressing with S with minimal L lean, VC for correction Pt completes LB dressing with A to advance pants past hips sit to stand with target on L to reach to in standing for midline orientation Pt completes footwear with CGA and pt abl eot demo 1 handed technique Pt completes shower/Tub transfer with MIN A STS In stedy   Therapeutic exercise HOH A (MIN facilitation intermittently) for 1# dowel rod HEP. Pt completes 2x10-15 dowel rod therex for BUE shoulder strengthening required for BADLs/functional transfers/NMR as follows with demo cuing and 1 # dowel rod  Shoulder flex/ext, shoulder press, horizonal ab/adduct Circles in B directions Elbow flex/ext, chest press Wrist flex/ext   Pt left at end of session in Bed with exit alarm on, call light in reach and all needs met   Therapy Documentation Precautions:  Precautions Precautions: Fall Precaution Comments: R hemiparesis and incoordination, pushing to R in stand Restrictions Weight Bearing Restrictions: No General:   Vital Signs: Therapy Vitals Temp: 98.3 F (36.8 C) Temp Source: Oral Pulse Rate: 80 Resp: 18 BP: 107/66 Patient Position (if appropriate): Lying Oxygen  Therapy O2 Device: Room Air Pain:   ADL: ADL Eating: Minimal assistance, Minimal cueing Where Assessed-Eating: Bed level Grooming: Setup Where Assessed-Grooming: Sitting at sink Upper Body Bathing: Minimal assistance, Minimal cueing Where Assessed-Upper Body Bathing: Edge of bed Lower Body Bathing: Moderate assistance Where Assessed-Lower Body Bathing: Edge of bed Upper Body Dressing: Minimal assistance, Minimal cueing Where Assessed-Upper Body Dressing: Edge of bed Lower Body Dressing: Moderate assistance, Minimal cueing Where Assessed-Lower Body Dressing: Edge of bed Toileting: Maximal assistance, Minimal cueing Where Assessed-Toileting: Bedside Commode Toilet Transfer: Maximal assistance Toilet Transfer Method: Stand pivot Toilet Transfer Equipment: Drop arm bedside commode Vision   Perception    Praxis   Exercises:   Other Treatments:     Therapy/Group: Individual Therapy  [name] 12/18/2019, 6:53 AM

## 2019-12-19 NOTE — Progress Notes (Signed)
[facility] PHYSICAL MEDICINE & REHABILITATION PROGRESS NOTE   Subjective/Complaints: Slept off and on. No problems otherwise. Headaches better  ROS: Patient denies fever, rash, sore throat, blurred vision, nausea, vomiting, diarrhea, cough, shortness of breath or chest pain, joint or back pain, headache, or mood change.    Objective:   No results found. No results for input(s): WBC, HGB, HCT, PLT in the last 72 hours. No results for input(s): NA, K, CL, CO2, GLUCOSE, BUN, CREATININE, CALCIUM in the last 72 hours.  Intake/Output Summary (Last 24 hours) at 12/19/2019 0902 Last data filed at 12/19/2019 0820 Gross per 24 hour  Intake 360 ml  Output 425 ml  Net -65 ml        Physical Exam: Vital Signs Blood pressure 124/75, pulse 79, temperature 98.3 F (36.8 C), resp. rate 18, height 5\' 2"  (1.575 m), weight 47.2 kg, SpO2 98 %.  Constitutional: No distress . Vital signs reviewed. HEENT: EOMI, oral membranes moist Neck: supple Cardiovascular: RRR without murmur. No JVD    Respiratory/Chest: CTA Bilaterally without wheezes or rales. Normal effort    GI/Abdomen: BS +, non-tender, non-distended Ext: no clubbing, cyanosis, or edema Psych: pleasant and cooperative Skin: intact Neuro:  alert. R facial droop, mild dysarthria.  Dysconjugate gaze.  Provides name and age. Slow, measured speech. RUE 3- SA, EE, EF, 4-/5 hand grip and WE RLE 3+/5 HF, KE, 4/5 DF, PF Decreased sensation throughout right side to LT/PP.     Assessment/Plan: 1. Functional deficits which require 3+ hours per day of interdisciplinary therapy in a comprehensive inpatient rehab setting.  Physiatrist is providing close team supervision and 24 hour management of active medical problems listed below.  Physiatrist and rehab team continue to assess barriers to discharge/monitor patient progress toward functional and medical goals  Care Tool:  Bathing    Body parts bathed by patient: Right arm, Left arm, Chest,  Abdomen, Front perineal area, Right upper leg, Left upper leg, Face   Body parts bathed by helper: Buttocks, Right lower leg, Left lower leg     Bathing assist Assist Level: Moderate Assistance - Patient 50 - 74%     Upper Body Dressing/Undressing Upper body dressing   What is the patient wearing?: Pull over shirt    Upper body assist Assist Level: Moderate Assistance - Patient 50 - 74%    Lower Body Dressing/Undressing Lower body dressing      What is the patient wearing?: Incontinence brief, Pants     Lower body assist Assist for lower body dressing: Maximal Assistance - Patient 25 - 49%     Toileting Toileting    Toileting assist Assist for toileting: Maximal Assistance - Patient 25 - 49%     Transfers Chair/bed transfer  Transfers assist     Chair/bed transfer assist level: Maximal Assistance - Patient 25 - 49%     Locomotion Ambulation   Ambulation assist      Assist level: Moderate Assistance - Patient 50 - 74% Assistive device: Other (comment) (L hand rail) Max distance: 28 ft   Walk 10 feet activity   Assist  Walk 10 feet activity did not occur: Safety/medical concerns (R hemi, R pusher, decreased motor planning, R inattention)  Assist level: Moderate Assistance - Patient - 50 - 74%     Walk 50 feet activity   Assist Walk 50 feet with 2 turns activity did not occur: Safety/medical concerns         Walk 150 feet activity   Assist  Walk 150 feet activity did not occur: Safety/medical concerns         Walk 10 feet on uneven surface  activity   Assist Walk 10 feet on uneven surfaces activity did not occur: Safety/medical concerns         Wheelchair     Assist Will patient use wheelchair at discharge?: Yes Type of Wheelchair: Manual    Wheelchair assist level: Dependent - Patient 0% (patient stated she couldn't and did not know how, required skilled intervention to propel w/c with present deficits) Max wheelchair  distance: >150 ft    Wheelchair 50 feet with 2 turns activity    Assist        Assist Level: Dependent - Patient 0%   Wheelchair 150 feet activity     Assist      Assist Level: Dependent - Patient 0%   Blood pressure 124/75, pulse 79, temperature 98.3 F (36.8 C), resp. rate 18, height 5\' 2"  (1.575 m), weight 47.2 kg, SpO2 98 %.  Medical Problem List and Plan: 1.  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  Antithrombotics: -DVT/anticoagulation: Subcutaneous heparin             -antiplatelet therapy: N/A 3. Pain Management: Tylenol as needed- for occasional left sided headache 4. Mood: Provide emotional support             -antipsychotic agents: N/A 5. Neuropsych: This patient is capable of making decisions on her own behalf. 6. Skin/Wound Care: Routine skin checks 7. Fluids/Electrolytes/Nutrition: Routine in and outs with follow-up chemistries 8.  Hypertension.  Norvasc 5 mg daily.    12/4 controlled. 9.  Tobacco abuse.  NicoDerm patch.  Provide counseling 10.  History of alcohol use.  Provide counseling. Continue folic acid supplement 11. Tachycardic to 103 on admission-      resolved 12. Urinary retention: pvr's generally less than 200, urine still cloudy  12/2-ucx with 100k aerococcus urinae.    -amoxil 12/2-12/4 13. Acute blood loss anemia: Monitor Hgb weekly. 11.8 on 11/30 14. Multiple BM per day: stopped senna-docusate--formed stool 12/3    LOS: 5 days A FACE TO FACE EVALUATION WAS PERFORMED  14/3 12/19/2019, 9:02 AM

## 2019-12-20 ENCOUNTER — Inpatient Hospital Stay (HOSPITAL_COMMUNITY): Payer: Self-pay

## 2019-12-20 ENCOUNTER — Inpatient Hospital Stay (HOSPITAL_COMMUNITY): Payer: Self-pay | Admitting: Occupational Therapy

## 2019-12-20 ENCOUNTER — Inpatient Hospital Stay (HOSPITAL_COMMUNITY): Payer: Self-pay | Admitting: Speech Pathology

## 2019-12-20 NOTE — Progress Notes (Signed)
Speech Language Pathology Daily Session Note  Patient Details  Name: Melissa Colon MRN: [id number] Date of Birth: 1960/09/29  Today's Date: 12/20/2019 SLP Individual Time: 1420-1500 SLP Individual Time Calculation (min): 40 min  Short Term Goals: Week 1: SLP Short Term Goal 1 (Week 1): Patient will recall new, daily information with Min verbal cues for use of memory compensatory strategies. SLP Short Term Goal 2 (Week 1): Patient will utilize speech intelligibility strategies at the sentence level ot achieve ~90% intelligibility with supervision verbal cues. SLP Short Term Goal 3 (Week 1): Patient will self-monitor and correct errors during verbal expression with overall supervision level verbal cues. SLP Short Term Goal 4 (Week 1): Patient will demonstrate functional problem solving for mildly complex tasks with Min verbal cues.  Skilled Therapeutic Interventions:  Pt was seen for skilled ST targeting goals for speech intelligibility.  During informal conversations with SLP, pt was able to maintain intelligibility in a relatively quiet environment with mod I.  When challenged with a structured verbal description task, pt was again able to maintain intelligibility with mod I.  Pt was left in bed with bed alarm set and call bell within reach.  Continue per current plan of care.    Pain Pain Assessment Pain Scale: 0-10 Pain Score: 0-No pain  Therapy/Group: Individual Therapy  , Melanee Spry 12/20/2019, 4:27 PM

## 2019-12-20 NOTE — Progress Notes (Signed)
Occupational Therapy Session Note  Patient Details  Name: Melissa Colon MRN: [id number] Date of Birth: Nov 30, 1960  Today's Date: 12/20/2019 OT Individual Time: 1105-1201 OT Individual Time Calculation (min): 56 min   Short Term Goals: Week 1:  OT Short Term Goal 1 (Week 1): Pt will don shirt with CGA OT Short Term Goal 2 (Week 1): Pt will require no more than min cueing for positioning of her RUE OT Short Term Goal 3 (Week 1): Pt will complete toilet transfer with mod A  Skilled Therapeutic Interventions/Progress Updates:    Pt greeted in the bed with no c/o pain. Declining bathing. Agreeable to complete toilet transfer per timed toileting schedule. Mod A for supine<sit and CGA for sit<stand in [place]. Pt initiated trying to place her Rt hand onto the [facility] bar. Worked on obtaining neutral midline alignment while semi perched on [place] during toilet transfer + toileting tasks as appropriate (pt with continent bladder void). Increased tone in the Rt LE noted with OT having to place her foot back on [employer]. Visual cuing for neutral alignment when completing hand washing and oral care (using mouth rinse) while semi perched at the sink after. Transitioned to Rt UE NMR activity focusing on gross motor coordination and grasp/release abilities while sitting EOB. Pt with motor apraxia of the Rt hand, required min-mod facilitation from therapist for guided movement and Total A for tenodesis. Significantly increased time for grasp/release motoric response. Supervision-Min A for sitting balance. At end of session pt returned to bed with Mod A and was left with all needs within reach and bed alarm set.   Therapy Documentation Precautions:  Precautions Precautions: Fall Precaution Comments: R hemiparesis and incoordination, pushing to R in stand Restrictions Weight Bearing Restrictions: No Pain: Pain Assessment Pain Score: 0-No pain ADL: ADL Eating: Minimal assistance, Minimal  cueing Where Assessed-Eating: Bed level Grooming: Setup Where Assessed-Grooming: Sitting at sink Upper Body Bathing: Minimal assistance, Minimal cueing Where Assessed-Upper Body Bathing: Edge of bed Lower Body Bathing: Moderate assistance Where Assessed-Lower Body Bathing: Edge of bed Upper Body Dressing: Minimal assistance, Minimal cueing Where Assessed-Upper Body Dressing: Edge of bed Lower Body Dressing: Moderate assistance, Minimal cueing Where Assessed-Lower Body Dressing: Edge of bed Toileting: Maximal assistance, Minimal cueing Where Assessed-Toileting: Bedside Commode Toilet Transfer: Maximal assistance Toilet Transfer Method: Stand pivot Toilet Transfer Equipment: Drop arm bedside commode      Therapy/Group: Individual Therapy   A  12/20/2019, 12:45 PM

## 2019-12-20 NOTE — Progress Notes (Signed)
Physical Therapy Session Note  Patient Details  Name: Melissa Colon MRN: [id number] Date of Birth: 1960/01/22  Today's Date: 12/20/2019 PT Individual Time: 2841-3244 PT Individual Time Calculation (min): 60 min   Short Term Goals: Week 1:  PT Short Term Goal 1 (Week 1): Patient will perform bed mobility with supervision. PT Short Term Goal 2 (Week 1): Patient will perform basic transfers with mod A consistently. PT Short Term Goal 3 (Week 1): Patient will ambulate 25 ft with mod A using LRAD.  Skilled Therapeutic Interventions/Progress Updates:     Patient in bed upon PT arrival. Patient alert and agreeable to PT session. Patient denied pain during session, continues to report early morning headaches that resolve with Tylenol.   Therapeutic Activity: Bed Mobility: Patient performed supine to/from sit with supervision in a flat bed with min use of bed rails. Provided verbal cues for bringing her knee to chest to lift her leg onto the bed. Transfers: Patient performed sit to/from stand with min A with a L rail x2 and B rails in the // bars x4. Provided verbal cues for R foot placement, forward weight shift, visual target for L hip to reduce pushing R, and facilitation for L hand grip on // bar. She performed stand pivot x2 with min-mod A. Facilitated weight shift and patient able to lift B feet for stepping while blocking R knee and facilitating foot placement due to ankle inversion with stepping.  Gait Training:  Patient ambulated 14 feet and 28 feet using L hand rail with min A for trunk support and intermittent min A for L limb advancement/foot placement. She then ambulated with B HHA with min A +2 with continued intermittent assist for R limb advancement/placement. Ambulated with step-to progressing to step-through gait pattern, decreased R hip/knee flexion and DF with increased ankle inversion with swing, decreased R knee control during stance with increased flexion or extensor thrust  without blocking, forward and R trunk flexion/lean, and decreased L weight shift. Provided verbal cues and facilitation for trunk elongation, weight shift to L wall or L when stepping with R foot, increased R hamstring activation in pre-swing, and facilitated R weight shift to increased L step length.  Wheelchair Mobility:  Patient propelled w/c with B lower extremities x35 feet with max cues and demonstration for L lower extremity stepping. She then propelled the w/c another 40 feet using R hemi-technique with supervision for cues. Limited distance due to fatigue at end of session.   Neuromuscular Re-ed: Patient performed the following standing balance and R lower extremity motor control activities: -standing balance with L rail with cues to lean against the rail/wall on her L to overcompensate for R lean/pushing and progressing to standing in midline -standing in // bars 2x1-2 min holding with B upper extremities, progressing from R lean to leaning against L bar with cues, then midline, progressing from min A-close supervision for balance and blocking R knee with multimodal cues for R knee/hip/tunk/elbow extension with cues to push to the L bar followed by cues to ease of pushing L to find midline -step taps to 6" step in // bars R/L x10 focused on hamstring and hip flexor activation with R taps and R weight shift without pushing R or locking out in hyperextension in stance when L taps, progressed from mod-min A for balance and min A-supervision for R foot placement and mod A for R knee control to min A for balance with independent R foot placement  Patient in bed at end  of session with breaks locked, bed alarm set, and all needs within reach.    Therapy Documentation Precautions:  Precautions Precautions: Fall Precaution Comments: R hemiparesis and incoordination, pushing to R in stand Restrictions Weight Bearing Restrictions: No   Therapy/Group: Individual Therapy   L   PT, DPT  12/20/2019, 12:59 PM

## 2019-12-21 ENCOUNTER — Inpatient Hospital Stay (HOSPITAL_COMMUNITY): Payer: Self-pay

## 2019-12-21 NOTE — Progress Notes (Signed)
Speech Language Pathology Daily Session Note  Patient Details  Name: Melissa Colon MRN: [id number] Date of Birth: 06-27-1960  Today's Date: 12/21/2019 SLP Individual Time: 1103-1200 SLP Individual Time Calculation (min): 57 min  Short Term Goals: Week 1: SLP Short Term Goal 1 (Week 1): Patient will recall new, daily information with Min verbal cues for use of memory compensatory strategies. SLP Short Term Goal 2 (Week 1): Patient will utilize speech intelligibility strategies at the sentence level ot achieve ~90% intelligibility with supervision verbal cues. SLP Short Term Goal 3 (Week 1): Patient will self-monitor and correct errors during verbal expression with overall supervision level verbal cues. SLP Short Term Goal 4 (Week 1): Patient will demonstrate functional problem solving for mildly complex tasks with Min verbal cues.  Skilled Therapeutic Interventions:   Skilled ST services focused on cognitive skills. SLP facilitated mildly complex problem solving and error awareness skills in medication TIB task,pt required supervision A verbal fade to mod I. Pt demonstrated ability to recall medication name/function/times per day with use of written aid. Pt expressed greater deficits in word finding versus dysarthria in communicating with others. SLP educated pt word finding strategies in divergent naming task. Pt was able to name 13 animals in 1 minute timeframe independently utilizing alphabetical strategy. After instruction of subcategorizes pt was able to increase divergent naming to 14 animals. Pt was left in room with call bell within reach and bed alarm set. SLP recommends to continue skilled services.   Pain Pain Assessment Pain Scale: 0-10 Pain Score: 0-No pain  Therapy/Group: Individual Therapy    [facility] 12/21/2019, 7:59 AM

## 2019-12-21 NOTE — Progress Notes (Signed)
Occupational Therapy Session Note  Patient Details  Name: Melissa Colon MRN: [id number] Date of Birth: [birth year]  Today's Date: 12/21/2019 OT Individual Time: 1350-1500 OT Individual Time Calculation (min): 70 min    Short Term Goals: Week 1:  OT Short Term Goal 1 (Week 1): Pt will don shirt with CGA OT Short Term Goal 2 (Week 1): Pt will require no more than min cueing for positioning of her RUE OT Short Term Goal 3 (Week 1): Pt will complete toilet transfer with mod A  Skilled Therapeutic Interventions/Progress Updates:    Pt received supine with husband present initially but leaving shortly thereafter. Pt c/o pain in her mouth, 4/10 from a gum infection ,reporting she is premedicated. Pt completed bed mobility with min A to EOB. She completed squat pivot transfer with mod A. Pt was taken via w/c to the therapy gym. She completed ambulatory transfer to the mat, 5 steps with mod A, requiring min facilitation for RLE advancement and ankle support 2/2 inversion. Pt completed seated level closed chain shoulder flexion with ball posterior to support more upright alignment, and facilitation on the RUE to support open hand support on the ball. Pt then completed standing level functional reaching task with focus on midline orientation and reduction of R pushing. Pt required mod A overall with mod cueing for L lean instead of R. Ambulatory transfer to another chair with RW with orthosis, mod A for standing balance and facilitation of weight shift. Pt completed blocked practice of sit <> stands from armless chair with a ball between her L shoulder and the wall to promote maintainance of midline orientation and reduce R lean.Pt required min A overall with improvement of R lean with visual and tactile feedback. Pt completed seated level R UE NMR, with focus on hand prehension, supination/pronation, and wrist stability. Ataxic movements still present, requiring mod facilitation at the elbow. Pt returned to  her w/c and to her room. toileting tasks completed, with mod A for squat pivot transfers. Pt required max A for clothing management, but was able to complete peri hygiene seated. Pt returned to supine with all needs met, bed alarm set.   Therapy Documentation Precautions:  Precautions Precautions: Fall Precaution Comments: R hemiparesis and incoordination, pushing to R in stand Restrictions Weight Bearing Restrictions: No Therapy/Group: Individual Therapy  Curtis Sites 12/21/2019, 6:41 AM

## 2019-12-21 NOTE — Progress Notes (Signed)
Physical Therapy Weekly Progress Note  Patient Details  Name: Melissa Colon MRN: [id number] Date of Birth: 07/26/1960  Beginning of progress report period: December 15, 2019 End of progress report period: December 21, 2019  Today's Date: 12/21/2019 PT Individual Time: 0900-0956 PT Individual Time Calculation (min): 56 min   Patient has met 3 of 3 short term goals.  Patient with good progress with functional mobility this week. Currently requires supervision for bed mobility, mod A for transfer without AD, min A for sit to stand with 1-2 upper extremity support on // bars or L rail, and min-mod A for gait 28 ft using L rail with w/c follow for safety. Patient presents with increased L lower extremity extensor tone, L inattention, decreased midline orientation and decreased motor planning with all mobility.   Patient continues to demonstrate the following deficits muscle weakness, decreased cardiorespiratoy endurance, abnormal tone, decreased coordination and decreased motor planning, decreased visual acuity and decreased visual motor skills, decreased midline orientation, decreased attention to right and decreased motor planning, decreased problem solving and decreased memory and decreased sitting balance, decreased standing balance, decreased postural control, hemiplegia and decreased balance strategies and therefore will continue to benefit from skilled PT intervention to increase functional independence with mobility.  Patient progressing toward long term goals..  Plan of care revisions: upgraded long term goals for mod I bed mobility, and supervision-CGA transfers due to improved balance and motor planning with functional mobility and for reduced caregiver burden..  PT Short Term Goals Week 1:  PT Short Term Goal 1 (Week 1): Patient will perform bed mobility with supervision. PT Short Term Goal 1 - Progress (Week 1): Met PT Short Term Goal 2 (Week 1): Patient will perform basic transfers  with mod A consistently. PT Short Term Goal 2 - Progress (Week 1): Met PT Short Term Goal 3 (Week 1): Patient will ambulate 25 ft with mod A using LRAD. PT Short Term Goal 3 - Progress (Week 1): Met Week 2:  PT Short Term Goal 1 (Week 2): Patient will perform basic transfers with min A consistently using LRAD. PT Short Term Goal 2 (Week 2): Patient will ambulate >50 ft with +1-2 assist using LRAD. PT Short Term Goal 3 (Week 2): Patient will propel w/c >50 ft in household set-up with distant supervision.  Skilled Therapeutic Interventions/Progress Updates:     Patient in bed upon PT arrival. Patient alert and agreeable to PT session. Patient denied pain during session. Reports that she slept poorly last night with increased interruptions and "mind running" about recovery.   Therapeutic Activity: Bed Mobility: Patient performed supine to/from sit with supervision with increased time and min use of bed rail. Provided verbal cues for rolling to the L, setting L elbow and pushing with B upper extremities to come to sitting and bringing L knee to chest to lift her leg onto the bed. Transfers: Patient performed stand pivot bed<>w/c, w/c<>mat table with mod to the R and min A to the L. Provided cues for midline orientation, weight shifting to step with manual facilitation, and sequencing. She performed sit to/from stand using L rail x1 with min A-CGA with cues for leaning toward the L wall for improved L weight shift and midline orientation.   Wheelchair Mobility:  Patient propelled wheelchair 55 feet with CGA-close supervision using R hemi-technique. Provided verbal cues for steering with R foot and avoiding objects on the L.  Neuromuscular Re-ed: Patient performed the following lower extremity motor control activities with functional moiblity: -  sit to/from stand with B upper extremity support on therapist's shoulders x2 with mod A and moderate R lean, placed tall mirror with wood side toward patient  on her L side for visual target, performed sit to/form stand x5 with improved midline orientation progressing to min A-CGA, also provided cues for increased hip extension, R knee extension, and forward pelvic and shoulder rotation on R -ambulated 28 feet using L rail with min A and intermittent facilitation for R foot placement due to mild inversion tone with stepping. Ambulated with mild R lean, delayed initiation for stepping with R, decreased R knee/hip flexion in swing, increased knee flexion on R in stance when blocking to prevent extensor thrust, and performed step-to with intermittent step through gait pattern with cues and facilitation for weight shifting. Provided additional verbal cues for maintaining hips and shoulder toward or against the rail, sequencing, and tactile cues for initiation of stepping with R.  Discussed assist level at home with patient. Patient reports that she typically helps out her husband during the day with providing meals and monitors him at night for sleep apnea. Stated that her brother-in-law or niece will be home at all times, but expressed concerns about them being late sleepers and her not having assist at night or in the morning all the time. Discussed upgrading goals and planning on independent w/c mobility for decreased caregiver burden at d/c. Patient in agreement.   Patient in bed at end of session due to fatigue with breaks locked, bed alarm set, and all needs within reach.   Therapy Documentation Precautions:  Precautions Precautions: Fall Precaution Comments: R hemiparesis and incoordination, pushing to R in stand Restrictions Weight Bearing Restrictions: No  Therapy/Group: Individual Therapy   L  PT, DPT  12/21/2019, 11:55 AM   

## 2019-12-21 NOTE — Plan of Care (Signed)
  Problem: Sit to Stand Goal: LTG:  Patient will perform sit to stand with assistance level (PT) Description: LTG:  Patient will perform sit to stand with assistance level (PT) Flowsheets (Taken 12/21/2019 1252) LTG: PT will perform sit to stand in preparation for functional mobility with assistance level: (upgraded goal due to improved balance and motor planning with functional mobility.) Contact Guard/Touching assist Note: upgraded goal due to improved balance and motor planning with functional mobility.   Problem: RH Bed Mobility Goal: LTG Patient will perform bed mobility with assist (PT) Description: LTG: Patient will perform bed mobility with assistance, with/without cues (PT). Flowsheets (Taken 12/21/2019 1252) LTG: Pt will perform bed mobility with assistance level of: (upgraded goal due to improved balance and motor planning with functional mobility.) Independent with assistive device  Note: upgraded goal due to improved balance and motor planning with functional mobility.   Problem: RH Bed to Chair Transfers Goal: LTG Patient will perform bed/chair transfers w/assist (PT) Description: LTG: Patient will perform bed to chair transfers with assistance (PT). Flowsheets (Taken 12/21/2019 1252) LTG: Pt will perform Bed to Chair Transfers with assistance level: (upgraded goal due to improved balance and motor planning with functional mobility.) Supervision/Verbal cueing Note: upgraded goal due to improved balance and motor planning with functional mobility.

## 2019-12-21 NOTE — Progress Notes (Signed)
Patient ID: Melissa Colon, female   DOB: 10/04/1960, 59 y.o.   MRN: [id number]  SW scheduled hospital follow-up with [facility] health and [facility] [phone]) on Thursday, December 30 at 8:30am with Georgian Co, PA. SW requested for financial assistance, and orange card to be discussed at time of visit.   SW waiting on updates from [place] [facility] to follow-up on if they will make another attempt to see patient again.   Cecile Sheerer, MSW, LCSWA Office: [phone] Cell: [phone] Fax: [phone]

## 2019-12-21 NOTE — Progress Notes (Signed)
[place] PHYSICAL MEDICINE & REHABILITATION PROGRESS NOTE   Subjective/Complaints:  Pt reports occ HA on L side, but small and Tylenol fixes it as of this AM.  LBM yesterday.  R side still weak.    ROS:  Pt denies SOB, abd pain, CP, N/V/C/D, and vision changes  Objective:   No results found. No results for input(s): WBC, HGB, HCT, PLT in the last 72 hours. No results for input(s): NA, K, CL, CO2, GLUCOSE, BUN, CREATININE, CALCIUM in the last 72 hours.  Intake/Output Summary (Last 24 hours) at 12/21/2019 1905 Last data filed at 12/21/2019 1835 Gross per 24 hour  Intake 650 ml  Output 259 ml  Net 391 ml        Physical Exam: Vital Signs Blood pressure (!) 124/93, pulse 100, temperature 98.7 F (37.1 C), temperature source Oral, resp. rate 20, height 5\' 2"  (1.575 m), weight 47.2 kg, SpO2 96 %.  Constitutional: No distress . Vital signs reviewed. Laying in bed- appropriate, NAD HEENT: EOMI, oral membranes moist- eyeglasses taped on R side Neck: supple Cardiovascular: borderline tachycardia- regular rhythm  Respiratory/Chest: CTA B/L- no W/R/R- good air movement   GI/Abdomen: Soft, NT, ND, (+)BS d Ext: no clubbing, cyanosis, or edema Psych: pleasant and cooperative Skin: intact Neuro:  alert. R facial droop, mild dysarthria.  Dysconjugate gaze.  Provides name and age. Slow, measured speech. RUE 3- SA, EE, EF, 4-/5 hand grip and WE RLE 3+/5 HF, KE, 4/5 DF, PF Decreased sensation throughout right side to LT/PP.     Assessment/Plan: 1. Functional deficits which require 3+ hours per day of interdisciplinary therapy in a comprehensive inpatient rehab setting.  Physiatrist is providing close team supervision and 24 hour management of active medical problems listed below.  Physiatrist and rehab team continue to assess barriers to discharge/monitor patient progress toward functional and medical goals  Care Tool:  Bathing    Body parts bathed by patient: Right arm, Left  arm, Chest, Abdomen, Front perineal area, Right upper leg, Left upper leg, Face   Body parts bathed by helper: Buttocks, Right lower leg, Left lower leg     Bathing assist Assist Level: Moderate Assistance - Patient 50 - 74%     Upper Body Dressing/Undressing Upper body dressing   What is the patient wearing?: Pull over shirt    Upper body assist Assist Level: Moderate Assistance - Patient 50 - 74%    Lower Body Dressing/Undressing Lower body dressing      What is the patient wearing?: Incontinence brief, Pants     Lower body assist Assist for lower body dressing: Maximal Assistance - Patient 25 - 49%     Toileting Toileting    Toileting assist Assist for toileting: Dependent - Patient 0% (Stedy 1 assist)     Transfers Chair/bed transfer  Transfers assist     Chair/bed transfer assist level: Maximal Assistance - Patient 25 - 49%     Locomotion Ambulation   Ambulation assist      Assist level: Moderate Assistance - Patient 50 - 74% Assistive device: Other (comment) (L hand rail) Max distance: 28 ft   Walk 10 feet activity   Assist  Walk 10 feet activity did not occur: Safety/medical concerns (R hemi, R pusher, decreased motor planning, R inattention)  Assist level: Moderate Assistance - Patient - 50 - 74%     Walk 50 feet activity   Assist Walk 50 feet with 2 turns activity did not occur: Safety/medical concerns  Walk 150 feet activity   Assist Walk 150 feet activity did not occur: Safety/medical concerns         Walk 10 feet on uneven surface  activity   Assist Walk 10 feet on uneven surfaces activity did not occur: Safety/medical concerns         Wheelchair     Assist Will patient use wheelchair at discharge?: Yes Type of Wheelchair: Manual    Wheelchair assist level: Dependent - Patient 0% (patient stated she couldn't and did not know how, required skilled intervention to propel w/c with present deficits) Max  wheelchair distance: >150 ft    Wheelchair 50 feet with 2 turns activity    Assist        Assist Level: Dependent - Patient 0%   Wheelchair 150 feet activity     Assist      Assist Level: Dependent - Patient 0%   Blood pressure (!) 124/93, pulse 100, temperature 98.7 F (37.1 C), temperature source Oral, resp. rate 20, height 5\' 2"  (1.575 m), weight 47.2 kg, SpO2 96 %.  Medical Problem List and Plan: 1.  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  Antithrombotics: -DVT/anticoagulation: Subcutaneous heparin             -antiplatelet therapy: N/A 3. Pain Management: Tylenol as needed- for occasional left sided headache  12/6- pt reports tylenol works for 14/6 - con't regimen 4. Mood: Provide emotional support             -antipsychotic agents: N/A 5. Neuropsych: This patient is capable of making decisions on her own behalf. 6. Skin/Wound Care: Routine skin checks 7. Fluids/Electrolytes/Nutrition: Routine in and outs with follow-up chemistries 8.  Hypertension.  Norvasc 5 mg daily.    12/4 controlled.  12/6- BP slightly elevated- since has been controlled won't change meds today- will monitor- 124/93 9.  Tobacco abuse.  NicoDerm patch.  Provide counseling 10.  History of alcohol use.  Provide counseling. Continue folic acid supplement 11. Tachycardic to 103 on admission-      resolved  12/6- HR to 100 today- will monitor- has been resolved 12. Urinary retention: pvr's generally less than 200, urine still cloudy  12/2-ucx with 100k aerococcus urinae.    -amoxil 12/2-12/4 13. Acute blood loss anemia: Monitor Hgb weekly. 11.8 on 11/30 14. Multiple BM per day: stopped senna-docusate--formed stool 12/3  12/6- LBM yesterday con't regimen     LOS: 7 days A FACE TO FACE EVALUATION WAS PERFORMED    12/21/2019, 7:05 PM

## 2019-12-22 ENCOUNTER — Inpatient Hospital Stay (HOSPITAL_COMMUNITY): Payer: Self-pay

## 2019-12-22 ENCOUNTER — Encounter (HOSPITAL_COMMUNITY): Payer: Self-pay | Admitting: Psychology

## 2019-12-22 DIAGNOSIS — K029 Dental caries, unspecified: Secondary | ICD-10-CM

## 2019-12-22 MED ORDER — CHLORHEXIDINE GLUCONATE 0.12 % MT SOLN
15.0000 mL | Freq: Two times a day (BID) | OROMUCOSAL | Status: DC
  Administered 2019-12-22 – 2020-01-05 (×25): 15 mL via OROMUCOSAL
  Filled 2019-12-22 (×26): qty 15

## 2019-12-22 MED ORDER — BENZOCAINE 10 % MT GEL
Freq: Three times a day (TID) | OROMUCOSAL | Status: DC
  Administered 2019-12-22 – 2019-12-23 (×3): 1 via OROMUCOSAL
  Filled 2019-12-22 (×2): qty 9

## 2019-12-22 NOTE — Progress Notes (Signed)
Physical Therapy Session Note  Patient Details  Name: Melissa Colon MRN: [id number] Date of Birth: 05-Jan-1961  Today's Date: 12/22/2019 PT Individual Time:1030-1120 and 1315-1415 PT Individual Time Calculation (min): 50 min and 60 min   Short Term Goals: Week 2:  PT Short Term Goal 1 (Week 2): Patient will perform basic transfers with min A consistently using LRAD. PT Short Term Goal 2 (Week 2): Patient will ambulate >50 ft with +1-2 assist using LRAD. PT Short Term Goal 3 (Week 2): Patient will propel w/c >50 ft in household set-up with distant supervision.  Skilled Therapeutic Interventions/Progress Updates:     Session 1: Patient in bed upon PT arrival. Patient alert and agreeable to PT session. Patient reported 4/10 mouth pain during session, RN made aware. PT provided repositioning, rest breaks, and distraction as pain interventions throughout session.   Attempted to locate R lap tray during session for improved upper extremity positioning for patient when sitting in the w/c, however, none available at this time.  Therapeutic Activity: Bed Mobility: Patient performed supine to sit with supervision and increased time. Provided verbal cues for log roll technique to improve leverage with R upper extremity to push to sitting. Transfers: Patient performed sit to/from stand x1 using L rail and x3 using R rail with min A. Provided verbal cues for L weight shift, R foot placement, scooting forward and forward weight shift to stand. She performed a stand pivot bed>w/c with mod A with increased R lean at beginning of session. Provided cues for L weight shift and sequencing.  She performed a stand pivot from a standard chair to w/c. Unable on first attempt due to patient self limiting due to fear of falling. Performed on second attempts, however, patient leaned into therapist propping on therapist's lap half way due to being surprised by her R foot sliding back with increased fear of falling.  Required second person assist to bring patient into the chair from this position.   Patient with significant frustration and negative outlook after poor performance with last transfers. Provided education on stroke recovery and words of encouragement. Patient slightly improved outlook after and agreeable to tray again this afternoon.   Gait Training:  Patient ambulated 28 feet using L rail with min A. Ambulated with reciprocal gait pattern with decreased step length on L and weight shift to the R, decreased knee/hip flexion in swing on R, but able to advance limb independently throughout today. Provided verbal cues for erect posture, B weight shift without pushing with facilitation, increased L step length, and increased hamstring activation in pre-swing on R for improved foot clearance.  Wheelchair Mobility:  Patient was transported in the w/c with total A throughout session for energy conservation and time management.  Neuromuscular Re-ed: Patient performed the following standing balance and R upper extremity motor control activities: -standing balance 3x30-60 sec holding R rail for increased R upper extremity attention and weight bearing, held on to therapist with her L hand initially and progressed to R upper extremity support only with min A throughout, provided cues for R knee/hip/trunk extension and L weight shift for equal weight bearing and improved midline orientation  Patient in w/c in the room, required increased encouragement to stay sitting up, but eventually agreeable at end of session with breaks locked, seat belt alarm set, and all needs within reach.   Session 2: Patient in w/c with her husband in the room upon PT arrival. Patient alert and agreeable to PT session. Patient denied pain during  session.  Discussed d/c planning, home set-up, and patient's goals with patient and her husband. Per her husband, there are 7 steep steps in the front and back of the house with wide B rails.  Unable to build a ramp due to required length due to limited space (~60 ft long, to meet 81ft for every 1" in height). Patient's husband to measure step height, door width, bed and car height, and shower barrier height to simulate home set-up during therapy sessions. Discussed plan for family education prior to d/c, patient and her husband would like him, his brother, his son, and their niece there, as they all will be caregivers for the patient upon d/c. Patient's husband with questions about Medicaid application and medical power of attorney, CSW made aware.  Therapeutic Activity: Bed Mobility: Patient performed sit to supine with min A for lifting her R leg in the bed due to decreased motor control. Provided verbal cues for bringing R knee to chest to lift her leg onto the bed. Transfers: Patient performed sit to/from stand x4 using L rail at the stairs with min A. Provided verbal cues as above.  Wheelchair Mobility:  Patient was transported in the w/c with total A throughout session for energy conservation and time management.  Neuromuscular Re-ed: Patient performed the following pre-stair training and lower extremity motor control activities in preparation for progressing with stair training due to having several steps to enter her home: -step tap to 1-3" step with R x10 focused on L quad and lateral hip activation for increased stability when stepping with her R foot -step-up to 3" step with R foot with B upper extremity support 2x3 -step up 2-3"steps x2 leading with R foot while ascending forwards and L foot while descending backwards, focused on sequencing, L foot placement, maintaining midline, and attention to R hand on the rail -step-tap with L foot to second 3" step with visual target 2x10 focused on increased hip flexor and hamstring activation for improved foot clearance, L foot placement on step and floor, and L weight shift for improved L foot clearance  Patient in bed with her husband  at bedside at end of session with breaks locked, bed alarm set, and all needs within reach.    Therapy Documentation Precautions:  Precautions Precautions: Fall Precaution Comments: R hemiparesis and incoordination, pushing to R in stand Restrictions Weight Bearing Restrictions: No   Therapy/Group: Individual Therapy   L  PT, DPT  12/22/2019, 4:35 PM

## 2019-12-22 NOTE — Patient Care Conference (Signed)
Inpatient RehabilitationTeam Conference and Plan of Care Update Date: 12/22/2019   Time: 10:22 AM    Patient Name: Melissa Colon      Medical Record Number: [id number]  Date of Birth: [birth year] Sex: Female         Room/Bed: 4W01C/4W01C-01 Payor Info: Payor: /    Admit Date/Time:  12/14/2019  1:33 PM  Primary Diagnosis:  ICH (intracerebral hemorrhage) [facility])  Hospital Problems: Principal Problem:   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 Active Problems:   Protein-calorie malnutrition, severe    Expected Discharge Date: Expected Discharge Date: 01/05/20  Team Members Present: Physician leading conference: Dr. Faith Rogue Care Coodinator Present: Cecile Sheerer, LCSWA; Marlyne Beards, RN, BSN, CRRN Nurse Present: Other (comment) Freddrick March, RN) PT Present: Serina Cowper, PT OT Present: Jake Shark, OT SLP Present: Feliberto Gottron, SLP PPS Coordinator present : Edson Snowball, Park Breed, SLP     Current Status/Progress Goal Weekly Team Focus  Bowel/Bladder   Pt continent of bowel and bladder; LBM 12/20/2019  To remain continent of bowel and bladder  Assess bowel and bladder needs q shift and PRN   Swallow/Nutrition/ Hydration             ADL's   CGA UB dressing, min A UB bathing, mod A LB bathing and dressing, mod A transfers, RUE full AROM- ataxic and with no sensation  CGA overall  R UE NMR, Midline orientation, ADL retraining, ADL transfers, functional activity tolerance   Mobility   supervision bed mobility, mod-min A transfers, min-mod A gait 28 ft with L rail with intermittent assist for R limb advancement, supervision w/c 55 feet using L hemi technique  CGA-supervision w/c level, min A gait, mod A stairs  balance, midline orientation, R attention (visual and UE/LE), R UE/LE NMR, functional mobility, gait and stair training, w/c mobility, patient/caregver education   Communication   Supervision-Mod I  Mod I  word-finding  strategies and speech intelligibility strategies   Safety/Cognition/ Behavioral Observations  Supervision-Min A  Supervision  complex problem solving and recall   Pain   Patient denies pain  Pain < or equal to 3/10  Assess pain q shift and PRN   Skin   Skin intact  Prevent skin from breakdown and infection  Assess skin q shift and PRN     Discharge Planning:  Pt is uninsured. Pt lives with husband, son, and brother and niece-none of which work and able to provide 24/7 care. Pt brother in law confirms pt will have 24/7 care at d/c.   Team Discussion: Patient is A&Ox4, continent B/B, I&O cath PRN, skin is CDI. OT reports patient contact guard for upper body dressing, min assist for lower body dressing. Very motivated. Contact guard goals. PT reports patient is working on balance and midline orientation. She is supervision for bed mobility, min to mod assist with left rail for gait of 28'. Right side is limited. SLP reports patient is working on speech intelligibility and word finding strategies. MD reports patient is having some dental pain and she is being treated for a UTI. Patient on target to meet rehab goals: yes  *See Care Plan and progress notes for long and short-term goals.   Revisions to Treatment Plan:  Not at this time.  Teaching Needs: Continue with family education.  Current Barriers to Discharge: Decreased caregiver support, Medical stability, Home enviroment access/layout, Lack of/limited family support and pain.  Possible Resolutions to Barriers: Continue with current medications, follow up outpatient for dental  pain, continue treatment for UTI, provide emotional support to patient and family.     Medical Summary Current Status: left thalamic hermorrhage with right hemiparesis, improved bp, occasional headaches, dental issues  Barriers to Discharge: Medical stability   Possible Resolutions to [employer] Focus: daily review of labs and data, bp control, pain  mgt   Continued Need for Acute Rehabilitation Level of Care: The patient requires daily medical management by a physician with specialized training in physical medicine and rehabilitation for the following reasons: Direction of a multidisciplinary physical rehabilitation program to maximize functional independence : Yes Medical management of patient stability for increased activity during participation in an intensive rehabilitation regime.: Yes Analysis of laboratory values and/or radiology reports with any subsequent need for medication adjustment and/or medical intervention. : Yes   I attest that I was present, lead the team conference, and concur with the assessment and plan of the team.   Tennis Must 12/22/2019, 1:31 PM

## 2019-12-22 NOTE — Consult Note (Signed)
Neuropsychological Consultation   Patient:   Melissa Colon   DOB:   1960-10-22  MR Number:  [id number]  Location:  [facility] [address] CENTER [id number] [place] [address] Dept: [phone] Loc: [phone]           Date of Service:   12/22/2019  Start Time:   3 PM End Time:   4 PM  Provider/Observer:  Arley Phenix, Psy.D.       Clinical Neuropsychologist       Billing Code/Service: [phone]  Chief Complaint:    Melissa Colon is a 59 year old female with history of alcohol and tobacco use, right cerebellar brain tumor with craniotomy as a teenager.  Patient presented on 12/10/2019 with right-sided weakness and mild aphasia.  Cranial CT scan showed acute intraparenchymal hemorrhage in the left thalamus measuring 2.5 x 3.1 x 2.1 cm.  Mild surrounding edema.  There is also an old bilateral occipital insult with dystrophic calcification.  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  Follow-up MRI showed a stable 2.4 cm acute hemorrhage at the left thalamus with edema extending to the left cerebral peduncle and posterior limb left internal capsule.  Reason for Service:  Patient was referred for neuropsychological consultation due to coping and adjustment issues following her intracranial hemorrhage at the left thalamus.  Patient with continued motor deficits.  Below is the HPI for the current admission.  HPI: Melissa Colon is a 59 year old right-handed female with history of alcohol tobacco use, right cerebellar brain tumor with craniotomy as a teenager.  Per chart review patient lives with spouse, son, brother-in-law and sister-in-law.  1 level home 5 steps to entry.  Reportedly independent prior to admission.  Presented 12/10/2019 with right side weakness and mild aphasia.  Cranial CT scan showed acute intraparenchymal hemorrhage in the left thalamus measuring 2.5 x 1.3 x 2.1  cm.  Mild surrounding edema.  Old bilateral occipital insults with dystrophic calcification.  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  MRA of the head high-grade proximal left P2 segment stenosis.  No other significant proximal stenosis aneurysm or branch vessel occlusion.  A follow-up MRI showed a stable 2.4 cm acute hemorrhage left thalamus with edema extending into the left cerebral peduncle and posterior limb left internal capsule.  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  Admission chemistries alcohol negative sodium 131 hemoglobin 11.6 urine drug screen negative.  Echocardiogram with ejection fraction of 60 to 65% no wall motion abnormalities grade 1 diastolic dysfunction.  Neurology follow-up close monitoring of blood pressure.  Patient was cleared to begin subcutaneous heparin for DVT prophylaxis.  Tolerating a regular diet.  Therapy evaluations completed and patient was admitted for a comprehensive rehab program.  Current Status:  Patient was alert and oriented when I entered the room but had some difficulty describing her fully understanding the specifics of her current medical status and events around her ICH.  The patient with residual motor deficits and some tactile sensation changes.  The patient also is having difficulty with motor control of her eyes and is experiencing double vision but reports that it is improving.  Patient denies significant depressive symptomatology but reports that she is quite concerned about her status going forward and the prospects for recovery.  The patient notes significant improvement over the past week and is motivated to continue to actively work on therapeutic efforts.  Behavioral Observation: Melissa Colon  presents as a 59  y.o.-year-old Right Caucasian Female who appeared her stated age. her dress was Appropriate and she was Well Groomed and her manners were Appropriate to the situation.  her participation was indicative of  Appropriate and Redirectable behaviors.  There were any physical disabilities noted.  she displayed an appropriate level of cooperation and motivation.     Interactions:    Active Appropriate and Redirectable  Attention:   abnormal and attention span appeared shorter than expected for age  Memory:   abnormal; remote memory intact, recent memory impaired  Visuo-spatial:  Patient reports experiencing double vision.  This is likely due to motor changes.  Speech (Volume):  low  Speech:   normal; slurred  Thought Process:  Coherent and Relevant  Though Content:  WNL; not suicidal and not homicidal  Orientation:   person, place and time/date  Judgment:   Fair  Planning:   Poor  Affect:    Flat  Mood:    Dysphoric  Insight:   Fair  Intelligence:   normal  Medical History:   Past Medical History:  Diagnosis Date  . Alcohol dependence (HCC)   . H/O brain tumor   . Smoker          Patient Active Problem List   Diagnosis Date Noted  . Protein-calorie malnutrition, severe 12/16/2019  . Hypertensive emergency 12/11/2019  . Alcohol dependence (HCC) 12/11/2019  . Tobacco use disorder 12/11/2019  . 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 12/10/2019     Psychiatric History:  Patient has a history of alcohol abuse and tobacco/alcohol use.  Family Med/Psych History:  Family History  Problem Relation Age of Onset  . Diabetes Mother    Impression/DX:  Melissa Colon is a 59 year old female with history of alcohol and tobacco use, right cerebellar brain tumor with craniotomy as a teenager.  Patient presented on 12/10/2019 with right-sided weakness and mild aphasia.  Cranial CT scan showed acute intraparenchymal hemorrhage in the left thalamus measuring 2.5 x 3.1 x 2.1 cm.  Mild surrounding edema.  There is also an old bilateral occipital insult with dystrophic calcification.  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  Follow-up MRI  showed a stable 2.4 cm acute hemorrhage at the left thalamus with edema extending to the left cerebral peduncle and posterior limb left internal capsule.  Patient was alert and oriented when I entered the room but had some difficulty describing her fully understanding the specifics of her current medical status and events around her ICH.  The patient with residual motor deficits and some tactile sensation changes.  The patient also is having difficulty with motor control of her eyes and is experiencing double vision but reports that it is improving.  Patient denies significant depressive symptomatology but reports that she is quite concerned about her status going forward and the prospects for recovery.  The patient notes significant improvement over the past week and is motivated to continue to actively work on therapeutic efforts.  Disposition/Plan:  Worked on coping and adjustment issues with residual effects of her ICH/left thalamus.  Diagnosis:    Nontraumatic subcortical hemorrhage of left cerebral hemisphere [facility]) - Plan: Ambulatory referral to Neurology         Electronically Signed   _______________________ Arley Phenix, Psy.D. Clinical Neuropsychologist

## 2019-12-22 NOTE — Progress Notes (Signed)
Occupational Therapy Weekly Progress Note  Patient Details  Name: Myah Guynes MRN: [id number] Date of Birth: Jul 09, 1960  Beginning of progress report period: December 15, 2019 End of progress report period: December 22, 2019  Today's Date: 12/22/2019 OT Individual Time: 0347-4259 OT Individual Time Calculation (min): 45 min    Patient has met 3 of 3 short term goals.  Nataki has made good progress this reporting period. Her ADL transfers have progressed from max A to mod A and she has carried over transfer techniques well. She has made improvements with her awareness and inclusion of the RUE. She can complete UB dressing with (S) and LB with mod A. She requires max A for for toileting tasks.   Patient continues to demonstrate the following deficits: muscle weakness, abnormal tone, unbalanced muscle activation, ataxia, decreased coordination and decreased motor planning, decreased midline orientation and decreased attention to right, decreased attention, decreased awareness, decreased safety awareness and delayed processing and decreased sitting balance, decreased standing balance, decreased postural control, hemiplegia and decreased balance strategies and therefore will continue to benefit from skilled OT intervention to enhance overall performance with BADL and Reduce care partner burden.  Patient progressing toward long term goals..  Continue plan of care.  OT Short Term Goals Week 1:  OT Short Term Goal 1 (Week 1): Pt will don shirt with CGA OT Short Term Goal 1 - Progress (Week 1): Met OT Short Term Goal 2 (Week 1): Pt will require no more than min cueing for positioning of her RUE OT Short Term Goal 2 - Progress (Week 1): Met OT Short Term Goal 3 (Week 1): Pt will complete toilet transfer with mod A OT Short Term Goal 3 - Progress (Week 1): Met Week 2:  OT Short Term Goal 1 (Week 2): Pt will complete toileting tasks with mod A OT Short Term Goal 2 (Week 2): Pt will appropriately  incorporate her RUE into grooming tasks with set up assist OT Short Term Goal 3 (Week 2): Pt will complete toilet transfer with min A  Skilled Therapeutic Interventions/Progress Updates:    Pt received supine with 2/10 gum pain in the L side of her mouth. Discussed oral care and hygiene, as well as encouraged pt to bring up pain/concerns with the MD. Pt reported she does not visit a dentist d/t lack of insurance. Pt declined ADLs. Bed mobility with CGA to EOB. Shoes donned max A. Squat pivot with mod A to the w/c. Oral care encouraged at the sink, min cueing for RUE use for stabilizer. In the therapy gym using the [employer] system pt completed functional reach activity with the RUE. She required mod facilitation at the elbow and wrist, with cueing also required for sequencing motor plan, for x25 trials. Trialed with and without .25 wrist weight to test affects on ataxia- no benefit to coordination. Discussed AROM exercises to complete in the room. Pt returned to her room and was left supine with all needs met, bed alarm set. Attempted to encourage pt to stay OOB but she declined.   Therapy Documentation Precautions:  Precautions Precautions: Fall Precaution Comments: R hemiparesis and incoordination, pushing to R in stand Restrictions Weight Bearing Restrictions: No   Therapy/Group: Individual Therapy  Curtis Sites 12/22/2019, 6:49 AM

## 2019-12-22 NOTE — Progress Notes (Signed)
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 Presented 12/10/19 with right-sided weakness and mild aphasia. Cranial CT scan showed acute intraparenchymal hemorrhage in the left thalamus. Admitted to CIR on 11/29.  Unable to speak with pt at time of visit. Pt in with Neuro Psych.  Meal completions have been improving, averaging 85% over the last 3 days of meal completions charted.  Pt accepting Ensure Enlive 100% of the times it is offered/available. Will continue with current supplement regimen.  Meal Completion: 45-100% x last 8 meals (averaging 85%)  Medications reviewed and include: Ensure Enlive BID, folic acid, MVI with minerals daily, protonix, thiamine  Labs reviewed.  Diet Order:   Diet Order            DIET SOFT Room service appropriate? Yes; Fluid consistency: Thin  Diet effective now                 EDUCATION NEEDS:   Education needs have been addressed  Skin:  Skin Assessment: Reviewed RN Assessment  Last BM:  12/20/19  Height:   Ht Readings from Last 1 Encounters:  12/14/19 5\' 2"  (1.575 m)     Weight:   Wt Readings from Last 1 Encounters:  12/18/19 47.2 kg    BMI:  Body mass index is 19.03 kg/m.  Estimated Nutritional Needs:   Kcal:  1550-1750  Protein:  70-85 grams  Fluid:  1.5-1.7 L    14/03/21, MS, RD, LDN Inpatient Clinical Dietitian Please see AMiON for contact information.

## 2019-12-22 NOTE — Progress Notes (Signed)
Patient ID: Melissa Colon, female   DOB: 13-Nov-1960, 59 y.o.   MRN: [id number]  SW met with pt and pt husband Melissa Colon in room to provide updates from team conference, and d/c date 01/05/20. SW explained this SW does not have patient's sign any forms. SW informed pt wife was screened for Medicaid with a Development worker, community. SW discussed charity DME, [employer]ATCH medication assistance program, and family education. No further questions/concerns reported by husband.  Loralee Pacas, MSW, [place] Office: [phone] Cell: [phone] Fax: [phone]

## 2019-12-22 NOTE — Progress Notes (Signed)
[place] PHYSICAL MEDICINE & REHABILITATION PROGRESS NOTE   Subjective/Complaints:  Had more headache last night, but really what seems to be bothering her are lower left teeth. (has had problems with them before)  ROS: Patient denies fever, rash, sore throat, blurred vision, nausea, vomiting, diarrhea, cough, shortness of breath or chest pain, joint or back pain, or mood change.    Objective:   No results found. No results for input(s): WBC, HGB, HCT, PLT in the last 72 hours. No results for input(s): NA, K, CL, CO2, GLUCOSE, BUN, CREATININE, CALCIUM in the last 72 hours.  Intake/Output Summary (Last 24 hours) at 12/22/2019 0904 Last data filed at 12/22/2019 0700 Gross per 24 hour  Intake 900 ml  Output 200 ml  Net 700 ml        Physical Exam: Vital Signs Blood pressure 117/67, pulse 87, temperature 98.6 F (37 C), resp. rate 18, height 5\' 2"  (1.575 m), weight 47.2 kg, SpO2 100 %.  Constitutional: No distress . Vital signs reviewed. HEENT: EOMI, oral membranes moist, dental caries left molars, area sensitive to palpation, sl mouth odor Neck: supple Cardiovascular: RRR without murmur. No JVD    Respiratory/Chest: CTA Bilaterally without wheezes or rales. Normal effort    GI/Abdomen: BS +, non-tender, non-distended Ext: no clubbing, cyanosis, or edema Psych: pleasant and cooperative Skin: intact Neuro:  right central 7, mild dysarthria.  Dysconjugate gaze.  Provides name and age. more alert and aware. RUE 3- SA, EE, EF, 4-/5 hand grip and WE RLE 3+/5 HF, KE, 4/5 DF, PF Decreased sensation throughout right side to LT/PP.     Assessment/Plan: 1. Functional deficits which require 3+ hours per day of interdisciplinary therapy in a comprehensive inpatient rehab setting.  Physiatrist is providing close team supervision and 24 hour management of active medical problems listed below.  Physiatrist and rehab team continue to assess barriers to discharge/monitor patient  progress toward functional and medical goals  Care Tool:  Bathing    Body parts bathed by patient: Right arm, Left arm, Chest, Abdomen, Front perineal area, Right upper leg, Left upper leg, Face   Body parts bathed by helper: Buttocks, Right lower leg, Left lower leg     Bathing assist Assist Level: Moderate Assistance - Patient 50 - 74%     Upper Body Dressing/Undressing Upper body dressing   What is the patient wearing?: Pull over shirt    Upper body assist Assist Level: Moderate Assistance - Patient 50 - 74%    Lower Body Dressing/Undressing Lower body dressing      What is the patient wearing?: Incontinence brief, Pants     Lower body assist Assist for lower body dressing: Maximal Assistance - Patient 25 - 49%     Toileting Toileting    Toileting assist Assist for toileting: Dependent - Patient 0% (Stedy 1 assist)     Transfers Chair/bed transfer  Transfers assist     Chair/bed transfer assist level: Maximal Assistance - Patient 25 - 49%     Locomotion Ambulation   Ambulation assist      Assist level: Moderate Assistance - Patient 50 - 74% Assistive device: Other (comment) (L hand rail) Max distance: 28 ft   Walk 10 feet activity   Assist  Walk 10 feet activity did not occur: Safety/medical concerns (R hemi, R pusher, decreased motor planning, R inattention)  Assist level: Moderate Assistance - Patient - 50 - 74%     Walk 50 feet activity   Assist Walk 50  feet with 2 turns activity did not occur: Safety/medical concerns         Walk 150 feet activity   Assist Walk 150 feet activity did not occur: Safety/medical concerns         Walk 10 feet on uneven surface  activity   Assist Walk 10 feet on uneven surfaces activity did not occur: Safety/medical concerns         Wheelchair     Assist Will patient use wheelchair at discharge?: Yes Type of Wheelchair: Manual    Wheelchair assist level: Dependent - Patient 0%  (patient stated she couldn't and did not know how, required skilled intervention to propel w/c with present deficits) Max wheelchair distance: >150 ft    Wheelchair 50 feet with 2 turns activity    Assist        Assist Level: Dependent - Patient 0%   Wheelchair 150 feet activity     Assist      Assist Level: Dependent - Patient 0%   Blood pressure 117/67, pulse 87, temperature 98.6 F (37 C), resp. rate 18, height 5\' 2"  (1.575 m), weight 47.2 kg, SpO2 100 %.  Medical Problem List and Plan: 1.  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  Antithrombotics: -DVT/anticoagulation: Subcutaneous heparin             -antiplatelet therapy: N/A 3. Pain Management: Tylenol as needed- for occasional left sided headache  12/6- tylenol for vascular h/a   -add chlorhexidine, oral jel for mouth. Will need outpt dentistry f/u at some point (doesn't have dentist) 4. Mood: Provide emotional support             -antipsychotic agents: N/A 5. Neuropsych: This patient is capable of making decisions on her own behalf. 6. Skin/Wound Care: Routine skin checks 7. Fluids/Electrolytes/Nutrition: Routine in and outs with follow-up chemistries 8.  Hypertension.  Norvasc 5 mg daily.    12/6 periodic elevation but generally controlled. No changes today 9.  Tobacco abuse.  NicoDerm patch.  Provide counseling 10.  History of alcohol use.  Provide counseling. Continue folic acid supplement 11. Tachycardic to 103 on admission-      resolved  12/7 HR generally controlled, 80's today 12. Urinary retention: pvr's generally less than 200, urine still cloudy  12/2-ucx with 100k aerococcus urinae.    -amoxil 12/2-12/4 completed 13. Acute blood loss  anemia: Monitor Hgb weekly. 11.8 on 11/30 14. Multiple BM per day: stopped senna-docusate--formed stool 12/3  12/7 bm on 12/5    LOS: 8 days A FACE TO FACE EVALUATION WAS PERFORMED  14/5 12/22/2019, 9:04 AM

## 2019-12-22 NOTE — Progress Notes (Signed)
Speech Language Pathology Weekly Progress and Session Note  Patient Details  Name: Melissa Colon MRN: [id number] Date of Birth: [birth year]  Beginning of progress report period: December 15, 2019 End of progress report period: December 22, 2019  Today's Date: 12/22/2019 SLP Individual Time: 0903-1000 SLP Individual Time Calculation (min): 57 min  Short Term Goals: Week 1: SLP Short Term Goal 1 (Week 1): Patient will recall new, daily information with Min verbal cues for use of memory compensatory strategies. SLP Short Term Goal 1 - Progress (Week 1): Met SLP Short Term Goal 2 (Week 1): Patient will utilize speech intelligibility strategies at the sentence level ot achieve ~90% intelligibility with supervision verbal cues. SLP Short Term Goal 2 - Progress (Week 1): Met SLP Short Term Goal 3 (Week 1): Patient will self-monitor and correct errors during verbal expression with overall supervision level verbal cues. SLP Short Term Goal 3 - Progress (Week 1): Met SLP Short Term Goal 4 (Week 1): Patient will demonstrate functional problem solving for mildly complex tasks with Min verbal cues. SLP Short Term Goal 4 - Progress (Week 1): Met    New Short Term Goals: Week 2: SLP Short Term Goal 1 (Week 2): Patient will recall new, daily information with supervision A verbal cues for use of memory compensatory strategies. SLP Short Term Goal 2 (Week 2): Patient will utilize speech intelligibility strategies at the conversation level ot achieve ~90% intelligibility with supervision verbal cues. SLP Short Term Goal 3 (Week 2): Patient will utilize word finding strategies in higher-level language tasks (ex: divergent, convergent...etc) with supervision A verbal cues. SLP Short Term Goal 4 (Week 2): Patient will demonstrate functional problem solving for complex tasks with Min verbal cues.  Weekly Progress Updates: Pt made great progress meeting 4 out 4 goals this reporting period.. Pt completed  personal medication management with supervision A verbal cues and reports husband manages money/appiontment management. SLP recommends to assess functional complex problem solving skills in upcoming reporting period. Pt demonstrates improvement in recall and use of speech intelligibility strategies, further impacted by dentures not in place. Pt expressed frustration with word finding deficits and SLP will focus on language, speech and cognitive goals going forward. Pt would continue to benefit from skilled ST services in order to maximize functional independence and reduce burden of care, requiring supervision at discharge with continued skilled ST services.     Intensity: Minumum of 1-2 x/day, 30 to 90 minutes Frequency: 3 to 5 out of 7 days Duration/Length of Stay: 12/21 Treatment/Interventions: Cognitive remediation/compensation;Internal/external aids;Speech/Language facilitation;Therapeutic Activities;Environmental controls;Cueing hierarchy;Functional tasks;Patient/family education   Daily Session  Skilled Therapeutic Interventions: Skilled ST services focused on language skills. SLP facilitated word finding skills in structured tasks. Pt was able to name antonyms in 10 out 10 opportunities with supervision A semantic/sentance completion cues, name synonymes in 9 out 10 opportunities with min A semantic/sentence completion cues, name items given 3 word descriptions with extra time, convergent naming with supervision A semantic cues and divergent naming (5 words that start with the letter M in a minute.)  SLP provided education pertaining to associations and self cuing in sentence formation. Pt was left in room with call bell within reach and bed alarm set. SLP recommends to continue skilled services.    General    Pain Pain Assessment Pain Scale: 0-10 Pain Score: 2  Pain Type: Acute pain Pain Location: Jaw Pain Orientation: Left Pain Descriptors / Indicators: Aching;Dull Pain Frequency:  Intermittent Patients Stated Pain Goal: 0 Pain Intervention(s): Medication (  See eMAR)  Therapy/Group: Individual Therapy    [facility] 12/22/2019, 3:36 PM

## 2019-12-23 ENCOUNTER — Inpatient Hospital Stay (HOSPITAL_COMMUNITY): Payer: Self-pay

## 2019-12-23 ENCOUNTER — Inpatient Hospital Stay (HOSPITAL_COMMUNITY): Payer: Self-pay | Admitting: Speech Pathology

## 2019-12-23 MED ORDER — FLEET ENEMA 7-19 GM/118ML RE ENEM
1.0000 | ENEMA | Freq: Once | RECTAL | Status: AC
  Administered 2019-12-23: 1 via RECTAL
  Filled 2019-12-23: qty 1

## 2019-12-23 MED ORDER — SENNOSIDES-DOCUSATE SODIUM 8.6-50 MG PO TABS
2.0000 | ORAL_TABLET | Freq: Every day | ORAL | Status: DC
  Administered 2019-12-23 – 2020-01-04 (×10): 2 via ORAL
  Filled 2019-12-23 (×10): qty 2

## 2019-12-23 MED ORDER — POLYETHYLENE GLYCOL 3350 17 G PO PACK
17.0000 g | PACK | Freq: Every day | ORAL | Status: DC
  Administered 2019-12-23 – 2020-01-05 (×14): 17 g via ORAL
  Filled 2019-12-23 (×14): qty 1

## 2019-12-23 NOTE — Progress Notes (Signed)
Physical Therapy Session Note  Patient Details  Name: Melissa Colon MRN: [id number] Date of Birth: 1960/02/19  Today's Date: 12/23/2019 PT Individual Time: 1000-1100 PT Individual Time Calculation (min): 60 min   Short Term Goals: Week 2:  PT Short Term Goal 1 (Week 2): Patient will perform basic transfers with min A consistently using LRAD. PT Short Term Goal 2 (Week 2): Patient will ambulate >50 ft with +1-2 assist using LRAD. PT Short Term Goal 3 (Week 2): Patient will propel w/c >50 ft in household set-up with distant supervision.  Skilled Therapeutic Interventions/Progress Updates:     Patient in bed asleep upon PT arrival. Patient did not arouse to verbal stimulation, but aroused to tactile stimulation. Reported increased fatigue today, but stated that she slept well last night. Patient agreeable to PT session and denied pain during session.  Therapeutic Activity: Bed Mobility: Patient performed supine to/from sit with min A due to increased fatigue today. Provided verbal cues for sequencing and management of R upper/lower extremity. Transfers: Patient performed squat pivot bed<>w/c and w/c<>NuStep seat with min-mod A. Provided cues for head-hips relationship, hand placement and blocked R foot from pulling under her for safety. She performed sit to/from stand x1 using a RW with R hand splint with mod-max A due to increased R lean and decreased motor planning to improve midline orientation today.   Wheelchair Mobility:  Patient propelled wheelchair 125 feet with supervision using L hemi-technique. Provided verbal cues for steering with her L foot and turning technique. Required increased time due to poor activity tolerance and fatigue.  Neuromuscular Re-ed: Patient performed the following R upper/lower extremity motor control activities: -NuStep 3 min 41 sec with 2 short rest breaks due to increased fatigue, initially placed ACE wrap over her R hand to maintain grip, but able to  progress to holding on without ACE wrap with intermittent assist and cues to attend to R hand grip, placed R foot with elastic band wrapped around to hold her foot in place; focused on control of R hip IR/ER, equal muscle activation R/L with reciprocal pattern -sitting balance EOB with reaching with R hand to visual target 2x2 min with CGA-supervision for sitting balance and LOB x1 to the R with patient able to push herself back up to sitting in midline  Patient in bed due to increased fatigue at end of session with breaks locked, bed alarm set, and all needs within reach.    Therapy Documentation Precautions:  Precautions Precautions: Fall Precaution Comments: R hemiparesis and incoordination, pushing to R in stand Restrictions Weight Bearing Restrictions: No   Therapy/Group: Individual Therapy   L  PT, DPT  12/23/2019, 4:39 PM

## 2019-12-23 NOTE — Progress Notes (Signed)
Occupational Therapy Session Note  Patient Details  Name: Melissa Colon MRN: [id number] Date of Birth: 06-Apr-1960  Today's Date: 12/23/2019 OT Individual Time: 1969-4098 OT Individual Time Calculation (min): 60 min    Short Term Goals: Week 2:  OT Short Term Goal 1 (Week 2): Pt will complete toileting tasks with mod A OT Short Term Goal 2 (Week 2): Pt will appropriately incorporate her RUE into grooming tasks with set up assist OT Short Term Goal 3 (Week 2): Pt will complete toilet transfer with min A  Skilled Therapeutic Interventions/Progress Updates:    Pt received supine with c/o 2/10 gum pain, RN arriving to administer medication. Pt completed bed mobility with extra time and effort, CGA overall. Min A squat pivot transfer to the w/c toward the L. Pt completed oral care at the sink with encouragement. Pt was taken to the therapy gym via w/c. Mod A squat pivot to the mat with mod A, initially attempted a stand pivot but terminated attempt 2/2 pushing and unsafe technique. Pt transitioned to sidelying on mat. Resistive bicep curls in L sidelying to promote visual attention, progressed from 1 lb to 3 lb resistance. Mod facilitation required for wrist stability but pt able to complete curls without assistance. Then in supine pt completed closed chain chest press and shoulder raises with unweighted dowel- min facilitation fading to CGA! Pt then completed 5 min on the [employer] arm bike with ace wrap used to secure and stabilize the RUE. Pt really enjoyed this activity. Pt returned to the room and was left supine with all needs met, bed alarm set.   Therapy Documentation Precautions:  Precautions Precautions: Fall Precaution Comments: R hemiparesis and incoordination, pushing to R in stand Restrictions Weight Bearing Restrictions: No  Therapy/Group: Individual Therapy  Curtis Sites 12/23/2019, 6:27 AM

## 2019-12-23 NOTE — Progress Notes (Signed)
[facility] PHYSICAL MEDICINE & REHABILITATION PROGRESS NOTE   Subjective/Complaints:  Teeth feel better. Was impacted last night. Fleet enema given with good results. Pt felt much better aftewards  ROS: Patient denies fever, rash, sore throat, blurred vision, nausea, vomiting, diarrhea, cough, shortness of breath or chest pain, joint or back pain, headache, or mood change.    Objective:   No results found. No results for input(s): WBC, HGB, HCT, PLT in the last 72 hours. No results for input(s): NA, K, CL, CO2, GLUCOSE, BUN, CREATININE, CALCIUM in the last 72 hours.  Intake/Output Summary (Last 24 hours) at 12/23/2019 0816 Last data filed at 12/22/2019 2012 Gross per 24 hour  Intake 475 ml  Output --  Net 475 ml        Physical Exam: Vital Signs Blood pressure 105/66, pulse 77, temperature 98.4 F (36.9 C), resp. rate 18, height 5\' 2"  (1.575 m), weight 47.2 kg, SpO2 97 %.  Constitutional: No distress . Vital signs reviewed. HEENT: EOMI, oral membranes moist Neck: supple Cardiovascular: RRR without murmur. No JVD    Respiratory/Chest: CTA Bilaterally without wheezes or rales. Normal effort    GI/Abdomen: BS +, non-tender, non-distended Ext: no clubbing, cyanosis, or edema Psych: pleasant and cooperative Skin: intact Neuro:  right central 7, mild dysarthria.  Dysconjugate gaze sl better.  Provides name and age. more alert and aware. RUE 3- SA, EE, EF, 4-/5 hand grip and WE RLE 3+/5 HF, KE, 4/5 DF, PF Decreased sensation throughout right side to LT/PP.     Assessment/Plan: 1. Functional deficits which require 3+ hours per day of interdisciplinary therapy in a comprehensive inpatient rehab setting.  Physiatrist is providing close team supervision and 24 hour management of active medical problems listed below.  Physiatrist and rehab team continue to assess barriers to discharge/monitor patient progress toward functional and medical goals  Care Tool:  Bathing    Body  parts bathed by patient: Right arm, Left arm, Chest, Abdomen, Front perineal area, Right upper leg, Left upper leg, Face   Body parts bathed by helper: Buttocks, Right lower leg, Left lower leg     Bathing assist Assist Level: Moderate Assistance - Patient 50 - 74%     Upper Body Dressing/Undressing Upper body dressing   What is the patient wearing?: Pull over shirt    Upper body assist Assist Level: Moderate Assistance - Patient 50 - 74%    Lower Body Dressing/Undressing Lower body dressing      What is the patient wearing?: Incontinence brief, Pants     Lower body assist Assist for lower body dressing: Maximal Assistance - Patient 25 - 49%     Toileting Toileting    Toileting assist Assist for toileting: Dependent - Patient 0% (Stedy 1 assist)     Transfers Chair/bed transfer  Transfers assist     Chair/bed transfer assist level: Moderate Assistance - Patient 50 - 74%     Locomotion Ambulation   Ambulation assist      Assist level: Minimal Assistance - Patient > 75% Assistive device: Other (comment) (L rail) Max distance: 28 ft   Walk 10 feet activity   Assist  Walk 10 feet activity did not occur: Safety/medical concerns (R hemi, R pusher, decreased motor planning, R inattention)  Assist level: Minimal Assistance - Patient > 75% Assistive device: Other (comment) (L rail)   Walk 50 feet activity   Assist Walk 50 feet with 2 turns activity did not occur: Safety/medical concerns  Walk 150 feet activity   Assist Walk 150 feet activity did not occur: Safety/medical concerns         Walk 10 feet on uneven surface  activity   Assist Walk 10 feet on uneven surfaces activity did not occur: Safety/medical concerns         Wheelchair     Assist Will patient use wheelchair at discharge?: Yes Type of Wheelchair: Manual    Wheelchair assist level: Dependent - Patient 0% (patient stated she couldn't and did not know how,  required skilled intervention to propel w/c with present deficits) Max wheelchair distance: >150 ft    Wheelchair 50 feet with 2 turns activity    Assist        Assist Level: Dependent - Patient 0%   Wheelchair 150 feet activity     Assist      Assist Level: Dependent - Patient 0%   Blood pressure 105/66, pulse 77, temperature 98.4 F (36.9 C), resp. rate 18, height 5\' 2"  (1.575 m), weight 47.2 kg, SpO2 97 %.  Medical Problem List and Plan: 1.  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  Antithrombotics: -DVT/anticoagulation: Subcutaneous heparin             -antiplatelet therapy: N/A 3. Pain Management: Tylenol as needed- for occasional left sided headache  12/6- tylenol for vascular h/a   -added chlorhexidine, oral jel for mouth.  12/8 feels that mouthwash helped!   - Will need outpt dentistry f/u at some point (doesn't have dentist) 4. Mood: Provide emotional support             -antipsychotic agents: N/A 5. Neuropsych: This patient is capable of making decisions on her own behalf. 6. Skin/Wound Care: Routine skin checks 7. Fluids/Electrolytes/Nutrition: Routine in and outs with follow-up chemistries 8.  Hypertension.  Norvasc 5 mg daily.    12/6 periodic elevation but generally controlled. No changes today 9.  Tobacco abuse.  NicoDerm patch.  Provide counseling 10.  History of alcohol use.  Provide counseling. Continue folic acid supplement 11. Tachycardic to 103 on admission-      resolved  12/7 HR generally controlled, 80's today 12. Urinary retention: pvr's generally less than 200, urine still cloudy  12/2-ucx with 100k aerococcus urinae.    -amoxil 12/2-12/4 completed 13. Acute blood loss anemia:  Monitor Hgb weekly. 11.8 on 11/30 14. Multiple BM per day: stopped senna-docusate--formed stool 12/3  12/7 bm on 12/5  12/8 no impacted last night   -resume senna-s, miralax    LOS: 9 days A FACE TO FACE EVALUATION WAS PERFORMED  14/8 12/23/2019, 8:16 AM

## 2019-12-23 NOTE — Progress Notes (Signed)
Nurse bladder scanned patient as 255cc, after patient had not voided in 8hrs. Patient stated " I dont feel like I have to go". Patient I&O  cathed and 100cc collected, nurse attempted to reposition catheter and urine stream stopped after 100cc. Patient bladder scanned again w/ 100cc still showing on bladder scan. PA Gertha Calkin on secure chat and notified of patient not voiding well, and fact that nurse has been cathing patient for the past two days, awaiting reply. Will continue to monitor patient output and bladder scan/I&O cath as needed.

## 2019-12-23 NOTE — Progress Notes (Signed)
At 0330am pt stated, "I need to go to the bathroom. I feel like I have to poop, but it wont come out." Upon assessment , pt had stool stuck in rectum. Pt got up to the toilet with staff and was unsuccessful having a BM. RN not PA on call and received orders for a fleet enema and daily miralax. RN administered enema and pt successfully had a BM. Pt stated, "I feel so much relief, thank you so much." Pt put back in bed by staff. RN will continue to monitor.

## 2019-12-23 NOTE — Progress Notes (Signed)
Patient ID: Melissa Colon, female   DOB: 07-16-1960, 59 y.o.   MRN: [id number]  Per admissions coordinator, [name] left a message with concerns related to patient signing forms.   SW returned phone call to [name] [phone]) to discuss above. Reported that he spoke with his brother in law and all questions were answered by SW yesterday. SW briefly discussed family edu next week. SW to follow-up to discuss dates.   Cecile Sheerer, MSW, LCSWA Office: [phone] Cell: [phone] Fax: [phone]

## 2019-12-23 NOTE — Progress Notes (Signed)
Speech Language Pathology Daily Session Note  Patient Details  Name: Denys Labree MRN: [id number] Date of Birth: [birth year]  Today's Date: 12/23/2019 SLP Individual Time: 1300-1400 SLP Individual Time Calculation (min): 60 min  Short Term Goals: Week 2: SLP Short Term Goal 1 (Week 2): Patient will recall new, daily information with supervision A verbal cues for use of memory compensatory strategies. SLP Short Term Goal 2 (Week 2): Patient will utilize speech intelligibility strategies at the conversation level ot achieve ~90% intelligibility with supervision verbal cues. SLP Short Term Goal 3 (Week 2): Patient will utilize word finding strategies in higher-level language tasks (ex: divergent, convergent...etc) with supervision A verbal cues. SLP Short Term Goal 4 (Week 2): Patient will demonstrate functional problem solving for complex tasks with Min verbal cues.  Skilled Therapeutic Interventions:   Patient seen with niece present in room for skilled ST session focusing on cognitive-linguistic goals. Patient required verbal cues to fully participate but this improved as session progressed. She was only able to name approximately 4 objects in a particular category and required modA cues to elicit more responses. She would initially respond to open ended questions and describe object words with one word response, such as when completing activity of describing an object/place and having SLP guess, for 'bowling alley' she said "pizza" and did not elaborate until SLP asked her to. She had strong negative reactions to things such as music and birds, but she did specify that she likes country music but does not like the music her husband listens to. She was 100% accurate with confrontational naming of object line drawings. She was able to describe some of her therapy tasks from earlier today when SLP provided context cues. Patient continues to benefit from skilled SLP intervention prior to  discharge.  Pain Pain Assessment Pain Scale: 0-10 Pain Score: 0-No pain  Therapy/Group: Individual Therapy  Angela Nevin, MA, CCC-SLP Speech Therapy

## 2019-12-24 ENCOUNTER — Inpatient Hospital Stay (HOSPITAL_COMMUNITY): Payer: Self-pay

## 2019-12-24 ENCOUNTER — Inpatient Hospital Stay (HOSPITAL_COMMUNITY): Payer: Self-pay | Admitting: Physical Therapy

## 2019-12-24 MED ORDER — TRAZODONE HCL 50 MG PO TABS: 25.0000 mg | ORAL_TABLET | Freq: Every evening | ORAL | Status: DC | PRN

## 2019-12-24 NOTE — Progress Notes (Signed)
Physical Therapy Session Note  Patient Details  Name: Melissa Colon MRN: [id number] Date of Birth: 1960-11-20  Today's Date: 12/24/2019 PT Individual Time: 1420-1510 PT Individual Time Calculation (min): 50 min   Short Term Goals: Week 2:  PT Short Term Goal 1 (Week 2): Patient will perform basic transfers with min A consistently using LRAD. PT Short Term Goal 2 (Week 2): Patient will ambulate >50 ft with +1-2 assist using LRAD. PT Short Term Goal 3 (Week 2): Patient will propel w/c >50 ft in household set-up with distant supervision.  Skilled Therapeutic Interventions/Progress Updates:     Patient in w/c in the room upon PT arrival. Patient alert and agreeable to PT session. Patient denied pain during session, however reported increased fatigue due to sitting in the w/c since her last morning session, ending at 11:15. Educated on calling for assist back to bed with nursing staff, patient reported that did not call, not wanting to "bother them." Educated patient on self-advocacy for fatigue management and mobility, patient stated understanding.   Therapeutic Activity: Bed Mobility: Patient performed sit to supine with min A for R leg management due to increased fatigue. Provided verbal cues for bringing her knees to her chest to lift her lower extremities onto the bed with poor coordination of her R leg. Transfers: Patient performed Patient performed a squat pivot transfer w/c<>mat and w/c>bed with min A for scooting forward, foot placement and transfer. Provided cues for hand placement and head-hips relationship for proper technique and decreased assist with transfers, required hand over hand assist for placing/keeping R hand in her lap for safety due to poor motor control and sensation. She performed sit to/from stand x2 using a RW with min/mod A with total A for L hand placement in hand splint on RW and x2 using L hand rail. Provided verbal cues for pre-stand positioning, forward and L  weight shift with visual target on the L, and hand placement for safety.  Gait Training:  Patient ambulated 28 feet forwards with min A and independent R foot placement and 10 feet backwards with mod A and max A for R foot placement using L rail. Ambulated with step-through gait pattern with decreased L step length and decreased B weight shift. Provided multimodal cues for weight shifting with emphasis on keeping hips and shoulders near the wall to reduce R lean, R foot placement (intermittnetly steps in or out of midline), R hamstring activation in pre-swing for improved foot clearance, erect posture, and looking ahead.  Wheelchair Mobility:  Patient was transported in the w/c with total A throughout session for energy conservation and time management.  Neuromuscular Re-ed: Patient performed the following standing balance and pre-gait activities: -standing balance x2 >45 sec with RW with CGA-min A, patient spontaneously letting go with L hand to adjust face mask without LOB, placed visual target on L to cue patient to reduce R lean -stepping forward and backwards with L foot focused on quad and gluteal activation on R in stance and increased R weight shift without R trunk lean x5 with min/mod A for balance and facilitation of weight shift -stepping forward and backwards with R foot focused on L weight and trunk shift to offload R foot and increased R step height, patient with poor weight shift and sliding L foot out to the side instead of shifting L x5 attempts with mod/max A for balance, weight shift, and forward progression of R foot x2  Discussed goals and expected functional mobility at d/c. Plan for  custom w/c evaluation to determine best independent mobility at d/c. Patient stated she was discouraged by needing a w/c at d/c. Educated on use of w/c for energy conservation and fall prevention and focus of therapy on progressing to functional ambulation at home both in inpatient and [facility] settings.  Patient stated understanding and appreciated education. Will continue to reinforce education to manage expectations and improve patient outlook on function during patient's stay.  Patient in bed at end of session with breaks locked, bed alarm set, and all needs within reach.    Therapy Documentation Precautions:  Precautions Precautions: Fall Precaution Comments: R hemiparesis and incoordination, pushing to R in stand Restrictions Weight Bearing Restrictions: No   Therapy/Group: Individual Therapy   L  PT, DPT  12/24/2019, 4:47 PM

## 2019-12-24 NOTE — Progress Notes (Signed)
Occupational Therapy Session Note  Patient Details  Name: Melissa Colon MRN: [id number] Date of Birth: 08-Jan-1961  Today's Date: 12/24/2019 OT Individual Time: 3299-2426 OT Individual Time Calculation (min): 58 min    Short Term Goals: Week 1:  OT Short Term Goal 1 (Week 1): Pt will don shirt with CGA OT Short Term Goal 1 - Progress (Week 1): Met OT Short Term Goal 2 (Week 1): Pt will require no more than min cueing for positioning of her RUE OT Short Term Goal 2 - Progress (Week 1): Met OT Short Term Goal 3 (Week 1): Pt will complete toilet transfer with mod A OT Short Term Goal 3 - Progress (Week 1): Met  Skilled Therapeutic Interventions/Progress Updates:    1:1. Pt received in w/c agreeable to OT. Pt declines bathing and dressing but agreeable to doing it tomorrow. Pt transported to tx gym total A in w/c for time management. Pt attempts to complete box and blocks assessment but only gets 1 block with RUE in 1 min d/t poor shoulder activation and ataxia. Pt completes picking up blocks from tabletop and placing in bed with min A to facilitate shoulder flexion and decrease ataxia when reaching. VC for exaggerated digit extension provided. In supine, pt completes bimanual grasp on baseball bat and OT facilitates motions with min manual resistance from theraband for normalizing motions in gravity reduced or eliminated planes to strengthen RUE for NMR in prp for functional activities. Exited session with pt seated in w/c, exit alarm on and call light in reach   Therapy Documentation Precautions:  Precautions Precautions: Fall Precaution Comments: R hemiparesis and incoordination, pushing to R in stand Restrictions Weight Bearing Restrictions: No General:   Vital Signs: Therapy Vitals Temp: 98.9 F (37.2 C) Temp Source: Oral Pulse Rate: 87 Resp: 18 BP: 117/64 Patient Position (if appropriate): Lying Oxygen Therapy SpO2: 97 % O2 Device: Room Air Pain: Pain Assessment Pain  Scale: 0-10 Pain Score: 1  Pain Type: Acute pain Pain Location: Head Pain Orientation: Left Pain Descriptors / Indicators: Aching;Dull Pain Frequency: Intermittent Pain Onset: Gradual Patients Stated Pain Goal: 1 Pain Intervention(s): Medication (See eMAR);Repositioned ADL: ADL Eating: Minimal assistance,Minimal cueing Where Assessed-Eating: Bed level Grooming: Setup Where Assessed-Grooming: Sitting at sink Upper Body Bathing: Minimal assistance,Minimal cueing Where Assessed-Upper Body Bathing: Edge of bed Lower Body Bathing: Moderate assistance Where Assessed-Lower Body Bathing: Edge of bed Upper Body Dressing: Minimal assistance,Minimal cueing Where Assessed-Upper Body Dressing: Edge of bed Lower Body Dressing: Moderate assistance,Minimal cueing Where Assessed-Lower Body Dressing: Edge of bed Toileting: Maximal assistance,Minimal cueing Where Assessed-Toileting: Bedside Commode Toilet Transfer: Maximal assistance Toilet Transfer Method: Stand pivot Toilet Transfer Equipment: Drop arm bedside commode Vision   Perception    Praxis   Exercises:   Other Treatments:     Therapy/Group: Individual Therapy  [name] 12/24/2019, 6:56 AM

## 2019-12-24 NOTE — Progress Notes (Signed)
[place] PHYSICAL MEDICINE & REHABILITATION PROGRESS NOTE   Subjective/Complaints:  Was restless last night. Didn't sleep all that well. Just nervous that she hasn't made as much progress as she hoped. Up walking with PT when I saw her  ROS: Patient denies fever, rash, sore throat, blurred vision, nausea, vomiting, diarrhea, cough, shortness of breath or chest pain, joint or back pain, headache .     Objective:   No results found. No results for input(s): WBC, HGB, HCT, PLT in the last 72 hours. No results for input(s): NA, K, CL, CO2, GLUCOSE, BUN, CREATININE, CALCIUM in the last 72 hours.  Intake/Output Summary (Last 24 hours) at 12/24/2019 1003 Last data filed at 12/24/2019 0700 Gross per 24 hour  Intake 640 ml  Output 100 ml  Net 540 ml        Physical Exam: Vital Signs Blood pressure 117/64, pulse 87, temperature 98.9 F (37.2 C), temperature source Oral, resp. rate 18, height 5\' 2"  (1.575 m), weight 47.2 kg, SpO2 97 %.  Constitutional: No distress . Vital signs reviewed. HEENT: EOMI, oral membranes moist Neck: supple Cardiovascular: RRR without murmur. No JVD    Respiratory/Chest: CTA Bilaterally without wheezes or rales. Normal effort    GI/Abdomen: BS +, non-tender, non-distended Ext: no clubbing, cyanosis, or edema Psych: pleasant and cooperative Skin: intact Neuro:  right central 7, mild dysarthria.  Dysconjugate gaze still present.  Provides name and age. more alert and aware. RUE 3- SA, EE, EF, 4-/5 hand grip and WE RLE 3+/5 HF, KE, 4/5 DF, PF. Advancing right leg better during gait but still needs some physical cueing.  Decreased sensation throughout right side to LT/PP.     Assessment/Plan: 1. Functional deficits which require 3+ hours per day of interdisciplinary therapy in a comprehensive inpatient rehab setting.  Physiatrist is providing close team supervision and 24 hour management of active medical problems listed below.  Physiatrist and rehab  team continue to assess barriers to discharge/monitor patient progress toward functional and medical goals  Care Tool:  Bathing    Body parts bathed by patient: Right arm,Left arm,Chest,Abdomen,Front perineal area,Right upper leg,Left upper leg,Face   Body parts bathed by helper: Buttocks,Right lower leg,Left lower leg     Bathing assist Assist Level: Moderate Assistance - Patient 50 - 74%     Upper Body Dressing/Undressing Upper body dressing   What is the patient wearing?: Pull over shirt    Upper body assist Assist Level: Moderate Assistance - Patient 50 - 74%    Lower Body Dressing/Undressing Lower body dressing      What is the patient wearing?: Incontinence brief,Pants     Lower body assist Assist for lower body dressing: Maximal Assistance - Patient 25 - 49%     Toileting Toileting    Toileting assist Assist for toileting: Dependent - Patient 0% (Stedy 1 assist)     Transfers Chair/bed transfer  Transfers assist     Chair/bed transfer assist level: Moderate Assistance - Patient 50 - 74%     Locomotion Ambulation   Ambulation assist      Assist level: Minimal Assistance - Patient > 75% Assistive device: Other (comment) (L rail) Max distance: 28 ft   Walk 10 feet activity   Assist  Walk 10 feet activity did not occur: Safety/medical concerns (R hemi, R pusher, decreased motor planning, R inattention)  Assist level: Minimal Assistance - Patient > 75% Assistive device: Other (comment) (L rail)   Walk 50 feet activity  Assist Walk 50 feet with 2 turns activity did not occur: Safety/medical concerns         Walk 150 feet activity   Assist Walk 150 feet activity did not occur: Safety/medical concerns         Walk 10 feet on uneven surface  activity   Assist Walk 10 feet on uneven surfaces activity did not occur: Safety/medical concerns         Wheelchair     Assist Will patient use wheelchair at discharge?: Yes Type  of Wheelchair: Manual    Wheelchair assist level: Dependent - Patient 0% (patient stated she couldn't and did not know how, required skilled intervention to propel w/c with present deficits) Max wheelchair distance: >150 ft    Wheelchair 50 feet with 2 turns activity    Assist        Assist Level: Dependent - Patient 0%   Wheelchair 150 feet activity     Assist      Assist Level: Dependent - Patient 0%   Blood pressure 117/64, pulse 87, temperature 98.9 F (37.2 C), temperature source Oral, resp. rate 18, height 5\' 2"  (1.575 m), weight 47.2 kg, SpO2 97 %.  Medical Problem List and Plan: 1.  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  Antithrombotics: -DVT/anticoagulation: Subcutaneous heparin             -antiplatelet therapy: N/A 3. Pain Management: Tylenol as needed- for occasional left sided headache  12/6- tylenol for vascular h/a   -added chlorhexidine, oral jel for mouth.  12/9 feels that mouthwash helped!   - Will need outpt dentistry f/u at some point (doesn't have dentist) 4. Mood: Provide emotional support             -antipsychotic agents: N/A  -will add prn trazodone to assist with sleep 5. Neuropsych: This patient is capable of making decisions on her own behalf. 6. Skin/Wound Care: Routine skin checks 7. Fluids/Electrolytes/Nutrition: encouraging PO 8.  Hypertension.  Norvasc 5 mg daily.    12/9 controlled 9.  Tobacco abuse.  NicoDerm patch.  Provide counseling 10.  History of alcohol use.  Provide counseling. Continue folic acid supplement 11. Tachycardic to 103 on admission-      resolved  12/9 HR generally controlled, 80's today 12. Urinary retention: pvr's generally less than 200, urine still cloudy  12/2-ucx with  100k aerococcus urinae.    -amoxil 12/2-12/4 completed 13. Acute blood loss anemia: Monitor Hgb weekly. 11.8 on 11/30 14. Multiple BM per day: stopped senna-docusate--formed stool 12/3  12/7 bm on 12/5  12/8 no impacted last night   -resumed senna-s, miralax    LOS: 10 days A FACE TO FACE EVALUATION WAS PERFORMED  14/8 12/24/2019, 10:03 AM

## 2019-12-24 NOTE — Progress Notes (Signed)
Physical Therapy Session Note  Patient Details  Name: Melissa Colon MRN: [id number] Date of Birth: 1960/06/26  Today's Date: 12/24/2019 PT Individual Time: 5188-4166 PT Individual Time Calculation (min): 45 min   Short Term Goals: Week 2:  PT Short Term Goal 1 (Week 2): Patient will perform basic transfers with min A consistently using LRAD. PT Short Term Goal 2 (Week 2): Patient will ambulate >50 ft with +1-2 assist using LRAD. PT Short Term Goal 3 (Week 2): Patient will propel w/c >50 ft in household set-up with distant supervision.  Skilled Therapeutic Interventions/Progress Updates:    Pt received supine in bed and agreeable to therapy session. Reports not sleeping well last night causing her to feel tired today. Supine>sitting R EOB, HOB partially elevated, with min assist and cuing to avoid L UE assist compensation for R LE management off EOB. Sitting EOB, supervision for sitting balance, donned shoes total assist for time management. L lateral scoot transfer EOB>w/c with min assist for scooting and pt demoing good motor planning to perform task. Transported to/from gym in w/c for time management and energy conservation. Sit>stands to L hallway rail with cuing to push up with L UE on armrest and max manual facilitation for R LE WBing and anterior tibial translation to prevent pt's R foot from sliding backwards under the w/c - min/mod assist for lifting/balance. Gait training ~36ft x2 at L hallway rail with min progressed to light mod assist on 2nd walk due to attempts at decreasing L UE support on railing for increased balance challenge (pt with increased fear of falling without holding onto rail causing more impaired motor planning and sequencing of gait therefore allowed pt to transition back to using L handrail) - during 1st walk able to advance R LE with intermittent min assist for repositioning on initial contact due to excessive hip adduction then on 2nd walk required up to mod assist  for R LE advancement due to increased fear of falling resulting in impaired motor planning - cuing throughout for R/L weight shift and for increased L LE step length to achieve reciprocal gait pattern as opposed to step-to leading with R LE - noticed to have intermittent R knee hyperextension during stance phase with min facilitation to block. At end of each walk, pt able to turn ~90degrees and sit in chair with mod assist and cuing for sequencing (unable to adequately sequence weight shift and stepping back with R LE during transfer to achieve normalized movement but instead often keeping R LE extended with significant compensation via L LE WBing/weight shift) - same thing is true during L stand pivot to w/c.  MD in/out for morning assessment. Gait training ~71ft using RW with mod/max assist for balance due to STRONG pushing towards the R with pt scooting L LE laterally/abducting to increase ability to push during stance - requires max VERBAL cuing to sequence L weight shift during L stance in order to allow R LE advancement in swing with mod facilitation - due to pushing, only able to provide verbal cue for L weight shift as manual facilitation causes worsening of pushing. Transported back to room in w/c and pt agreeable to remain seated up - left with needs in reach, seat belt alarm on, and NT present.   Therapy Documentation Precautions:  Precautions Precautions: Fall Precaution Comments: R hemiparesis and incoordination, pushing to R in stand Restrictions Weight Bearing Restrictions: No  Pain: No reports of pain throughout session.  Therapy/Group: Individual Therapy   M  ,  PT, DPT, CSRS  12/24/2019, 7:52 AM

## 2019-12-24 NOTE — Progress Notes (Signed)
Speech Language Pathology Daily Session Note  Patient Details  Name: Melissa Colon MRN: [id number] Date of Birth: May 20, 1960  Today's Date: 12/24/2019 SLP Individual Time: 7672-0947 SLP Individual Time Calculation (min): 56 min  Short Term Goals: Week 2: SLP Short Term Goal 1 (Week 2): Patient will recall new, daily information with supervision A verbal cues for use of memory compensatory strategies. SLP Short Term Goal 2 (Week 2): Patient will utilize speech intelligibility strategies at the conversation level ot achieve ~90% intelligibility with supervision verbal cues. SLP Short Term Goal 3 (Week 2): Patient will utilize word finding strategies in higher-level language tasks (ex: divergent, convergent...etc) with supervision A verbal cues. SLP Short Term Goal 4 (Week 2): Patient will demonstrate functional problem solving for complex tasks with Min verbal cues.  Skilled Therapeutic Interventions: Skilled ST services focused on cognitive and dysarthria goals. SLP facilitating mildly complex problem solving task focused on safety for current and home environment by providing min A verbal cues to increase response detail (pt often responding to problem solving question stating "I would cuss" and requiring cues to increase functional responses). Pt benefits from min A verbal cues to increase awareness of how cognitive and physical impairments will impact function at home. Pt participating in simple short term memory task of novel information with 70% accuracy, benefiting from supervision A fading to min A verbal cues. SLP facilitating speech intelligibility strategies by providing education and written aid for carryover of "SLOP" - Slow, Loud, Overarticulate and Pace. Pt able to carryover and utilize strategies during simple conversation with min A. Pt left in wheelchair with chair alarm on and call button within reach. Cont ST POC.   Pain Pain Assessment Pain Scale: 0-10 Pain Score: 0-No  pain  Therapy/Group: Individual Therapy  Tacey Ruiz 12/24/2019, 10:23 AM

## 2019-12-25 ENCOUNTER — Inpatient Hospital Stay (HOSPITAL_COMMUNITY): Payer: Self-pay | Admitting: Speech Pathology

## 2019-12-25 ENCOUNTER — Inpatient Hospital Stay (HOSPITAL_COMMUNITY): Payer: Self-pay

## 2019-12-25 ENCOUNTER — Inpatient Hospital Stay (HOSPITAL_COMMUNITY): Payer: Self-pay | Admitting: Physical Therapy

## 2019-12-25 NOTE — Progress Notes (Signed)
Physical Therapy Session Note  Patient Details  Name: Melissa Colon MRN: [id number] Date of Birth: 10-Apr-1960  Today's Date: 12/25/2019 PT Individual Time: 0925-1010 PT Individual Time Calculation (min): 45 min   Short Term Goals: Week 2:  PT Short Term Goal 1 (Week 2): Patient will perform basic transfers with min A consistently using LRAD. PT Short Term Goal 2 (Week 2): Patient will ambulate >50 ft with +1-2 assist using LRAD. PT Short Term Goal 3 (Week 2): Patient will propel w/c >50 ft in household set-up with distant supervision.  Skilled Therapeutic Interventions/Progress Updates:    Pt received supine in bed and agreeable to therapy session. Supine>sitting R EOB, HOB partially elevated, with supervision and increased time - demos ability to advance R LE off EOB without compensatory use of L UE. Reciprocal scooting towards EOB with good motor planning and sequencing while therapist facilitated R UE positioning to promote WBing and R LE WBing with anterior translation of tibia. L squat pivot/lateral scoot transfer EOB>w/c with mod assist for lifting/pivoting hips and facilitation for R LE WBing and positioning as her foot would slide forward on ground when attempt to push through it to scoot hips backwards.  Transported to/from gym in w/c for time management and energy conservation. R lateral scoot/squat pivot w/c>EOB with mod assist for lifting/pivoting hips and max cuing for head/hips relationship, increased anterior trunk lean, and continued facilitation for RLE positioning. Sit>stand EOM>RW with mod assist for lifting/balance due to posterior R lean/pushing - improved to min assist in standing - attempted to advance to pre-gait training focusing on R LE forward/backwards stepping but pt with difficulty maintaining L lateral weight shift due to excessive pushing. L squat pivot EOM>w/c with mod assist for lifting/pivoting hips and continued facilitation/cuing as described above.  Transitioned to standing with RW next to hallway wall/rail on L side for pt to feel more safe and to provide a visual target to assist with improved midline orientation and increased L weight shift and hip/trunk lean. Gait training ~64ft at L hallway rail but using RW with mod assist progressing to max assist at end due to increasing fatigue and busier environment making it more challenging for pt to concentrate on task - max sequential cuing throughout to weight shift "L Hip to hand" on RW as external target to promote L weight shift during stance and then mod/max assist for R LE swing phase advancement as pt has significant difficulty motor planning sustained L weight shift while advancing R LE (that becomes more prominent when using RW compared to when ambulating using L hallway rail). Required max/total assist to turn and sit in chair at end of ambulation due to poor motor planning and continued significant pushing causing strong R posterior lean. Standing next to wall on pt's L using B UE support on RW attempted pre-gait training via R LE forward/backwards stepping using mirror feedback to promote improved L weight shift and decreased pushing - pt demos good L weight shift using mirror feedback but still unable to motor plan stepping R LE forward while sustaining L weight shift. Pt becoming fatigued and spontaneously starting to initiate return to sitting in w/c. Transported back to room and left sitting in w/c with needs in reach and seat belt alarm on.  Therapy Documentation Precautions:  Precautions Precautions: Fall Precaution Comments: R hemiparesis and incoordination, pushing to R in stand Restrictions Weight Bearing Restrictions: No  Pain: No reports of pain throughout session.   Therapy/Group: Individual Therapy   M  ,  PT, DPT, CSRS  12/25/2019, 7:54 AM

## 2019-12-25 NOTE — Progress Notes (Signed)
Speech Language Pathology Daily Session Note  Patient Details  Name: Melissa Colon MRN: [id number] Date of Birth: 1960-01-21  Today's Date: 12/25/2019 SLP Individual Time: 0800-0900 SLP Individual Time Calculation (min): 60 min  Short Term Goals: Week 2: SLP Short Term Goal 1 (Week 2): Patient will recall new, daily information with supervision A verbal cues for use of memory compensatory strategies. SLP Short Term Goal 2 (Week 2): Patient will utilize speech intelligibility strategies at the conversation level ot achieve ~90% intelligibility with supervision verbal cues. SLP Short Term Goal 3 (Week 2): Patient will utilize word finding strategies in higher-level language tasks (ex: divergent, convergent...etc) with supervision A verbal cues. SLP Short Term Goal 4 (Week 2): Patient will demonstrate functional problem solving for complex tasks with Min verbal cues.  Skilled Therapeutic Interventions:   Patient seen for skilled ST session focusing on cognitive linguistic goals. She was pleasant and agreeable to session. Although she continues with a flat affect overall, SLP suspects this has something to do with her personality. When asked about progress, patient stated that to her, progress will be when her right arm and leg are back to normal. SLP led discussion regarding separating goal of full recovery from the progress towards that goals. Patient was understanding of this but said that she has always had that sort of attitude. Patient participated in language task of matching adjectives to nouns and was approximately 70% accurate, and at times she did not appear to be carefully making a choice. She was oriented to month and year but not date and she said she doesn't keep track of that. SLP discussed importance of patient at least having general idea of date, length of stay, days until discharge, etc. Patient continues to benefit from skilled SLP intervention to maximize cognitive-linguistic  and speech function prior to discharge.   Pain Pain Assessment Pain Scale: 0-10 Pain Score: 0-No pain Faces Pain Scale: No hurt Pain Type: Acute pain Pain Location: Head Pain Orientation: Left Pain Descriptors / Indicators: Dull;Aching Pain Frequency: Intermittent Pain Onset: Gradual Patients Stated Pain Goal: 1 Pain Intervention(s): Medication (See eMAR)  Therapy/Group: Individual Therapy  Angela Nevin, MA, CCC-SLP Speech Therapy

## 2019-12-25 NOTE — Progress Notes (Signed)
[place] PHYSICAL MEDICINE & REHABILITATION PROGRESS NOTE   Subjective/Complaints:  Slept a little better last night. Still frustrated that she's not back to where she was prior to [facility]. Denies pain today  ROS: Patient denies fever, rash, sore throat, blurred vision, nausea, vomiting, diarrhea, cough, shortness of breath or chest pain, joint or back pain, headache, or mood change.      Objective:   No results found. No results for input(s): WBC, HGB, HCT, PLT in the last 72 hours. No results for input(s): NA, K, CL, CO2, GLUCOSE, BUN, CREATININE, CALCIUM in the last 72 hours.  Intake/Output Summary (Last 24 hours) at 12/25/2019 1002 Last data filed at 12/25/2019 0813 Gross per 24 hour  Intake 780 ml  Output --  Net 780 ml        Physical Exam: Vital Signs Blood pressure (!) 115/54, pulse 91, temperature 97.8 F (36.6 C), resp. rate 14, height 5\' 2"  (1.575 m), weight 47.2 kg, SpO2 100 %.  Constitutional: No distress . Vital signs reviewed. HEENT: EOMI, oral membranes moist Neck: supple Cardiovascular: RRR without murmur. No JVD    Respiratory/Chest: CTA Bilaterally without wheezes or rales. Normal effort    GI/Abdomen: BS +, non-tender, non-distended Ext: no clubbing, cyanosis, or edema Psych: pleasant and cooperative Skin: intact Neuro:  right central 7, mild dysarthria.  Dysconjugate gaze still present.  Provides name and age. more alert and aware. RUE 3- SA, EE, EF, 4-/5 hand grip and WE RLE 3+/5 HF, KE, 4/5 DF, PF. Advancing right leg better during gait but still needs some physical cueing.  Decreased sensation throughout right side to LT/PP.     Assessment/Plan: 1. Functional deficits which require 3+ hours per day of interdisciplinary therapy in a comprehensive inpatient rehab setting.  Physiatrist is providing close team supervision and 24 hour management of active medical problems listed below.  Physiatrist and rehab team continue to assess barriers to  discharge/monitor patient progress toward functional and medical goals  Care Tool:  Bathing    Body parts bathed by patient: Right arm,Left arm,Chest,Abdomen,Front perineal area,Right upper leg,Left upper leg,Face   Body parts bathed by helper: Buttocks,Right lower leg,Left lower leg     Bathing assist Assist Level: Moderate Assistance - Patient 50 - 74%     Upper Body Dressing/Undressing Upper body dressing   What is the patient wearing?: Pull over shirt    Upper body assist Assist Level: Moderate Assistance - Patient 50 - 74%    Lower Body Dressing/Undressing Lower body dressing      What is the patient wearing?: Incontinence brief,Pants     Lower body assist Assist for lower body dressing: Maximal Assistance - Patient 25 - 49%     Toileting Toileting    Toileting assist Assist for toileting: Dependent - Patient 0% (Stedy 1 assist)     Transfers Chair/bed transfer  Transfers assist     Chair/bed transfer assist level: Moderate Assistance - Patient 50 - 74%     Locomotion Ambulation   Ambulation assist      Assist level: Maximal Assistance - Patient 25 - 49% Assistive device: Walker-rolling Max distance: 56ft   Walk 10 feet activity   Assist  Walk 10 feet activity did not occur: Safety/medical concerns (R hemi, R pusher, decreased motor planning, R inattention)  Assist level: Maximal Assistance - Patient 25 - 49% Assistive device: Walker-rolling   Walk 50 feet activity   Assist Walk 50 feet with 2 turns activity did not occur: Safety/medical  concerns         Walk 150 feet activity   Assist Walk 150 feet activity did not occur: Safety/medical concerns         Walk 10 feet on uneven surface  activity   Assist Walk 10 feet on uneven surfaces activity did not occur: Safety/medical concerns         Wheelchair     Assist Will patient use wheelchair at discharge?: Yes Type of Wheelchair: Manual    Wheelchair assist  level: Dependent - Patient 0% (patient stated she couldn't and did not know how, required skilled intervention to propel w/c with present deficits) Max wheelchair distance: >150 ft    Wheelchair 50 feet with 2 turns activity    Assist        Assist Level: Dependent - Patient 0%   Wheelchair 150 feet activity     Assist      Assist Level: Dependent - Patient 0%   Blood pressure (!) 115/54, pulse 91, temperature 97.8 F (36.6 C), resp. rate 14, height 5\' 2"  (1.575 m), weight 47.2 kg, SpO2 100 %.  Medical Problem List and Plan: 1.  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  Antithrombotics: -DVT/anticoagulation: Subcutaneous heparin             -antiplatelet therapy: N/A 3. Pain Management: Tylenol as needed- for occasional left sided headache  12/6- tylenol for vascular h/a   -added chlorhexidine, oral jel for mouth.   Pt feels mouthwash helped   - Will need outpt dentistry f/u at some point (doesn't have dentist) 4. Mood: Provide emotional support             -antipsychotic agents: N/A  - prn trazodone to assist with sleep 5. Neuropsych: This patient is capable of making decisions on her own behalf. 6. Skin/Wound Care: Routine skin checks 7. Fluids/Electrolytes/Nutrition: encouraging PO 8.  Hypertension.  Norvasc 5 mg daily.    12/10 controlled 9.  Tobacco abuse.  NicoDerm patch.  Provide counseling 10.  History of alcohol use.  Provide counseling. Continue folic acid supplement 11. Tachycardic to 103 on admission-      resolved  12/10 HR generally controlled, 90's today 12. Urinary retention: pvr's generally less than 200, urine still cloudy  12/2-ucx with 100k aerococcus  urinae.    -amoxil 12/2-12/4 completed 13. Acute blood loss anemia: Monitor Hgb weekly. 11.8 on 11/30 14. Multiple BM per day: stopped senna-docusate--formed stool 12/3  12/7  Disimpacted   12/8 -resumed senna-s, miralax  12/10-soft bm this morning    LOS: 11 days A FACE TO FACE EVALUATION WAS PERFORMED  14/8 12/25/2019, 10:02 AM

## 2019-12-25 NOTE — Progress Notes (Signed)
Occupational Therapy Session Note  Patient Details  Name: Melissa Colon MRN: [id number] Date of Birth: 1960-12-21  Today's Date: 12/25/2019 OT Individual Time: 1133-1200 OT Individual Time Calculation (min): 27 min    Short Term Goals: Week 1:  OT Short Term Goal 1 (Week 1): Pt will don shirt with CGA OT Short Term Goal 1 - Progress (Week 1): Met OT Short Term Goal 2 (Week 1): Pt will require no more than min cueing for positioning of her RUE OT Short Term Goal 2 - Progress (Week 1): Met OT Short Term Goal 3 (Week 1): Pt will complete toilet transfer with mod A OT Short Term Goal 3 - Progress (Week 1): Met  Skilled Therapeutic Interventions/Progress Updates:    1;1. Pt received in w/c agreeable to OT. Pt with no pain but bruise on R elbow. Provided elbow pad to protect skin because pt cannot feel. Pt educated on skin integrity and skin checking d/t decreased sensation. Pt completes NMR of RUE with UE ranger with MIN manual resistance and VC for activation of shoulder/exlobw, wrist and decreasing comp movements. Exited session with pt seated in w/c, exit alarm on and call light in reach   Therapy Documentation Precautions:  Precautions Precautions: Fall Precaution Comments: R hemiparesis and incoordination, pushing to R in stand Restrictions Weight Bearing Restrictions: No General:   Vital Signs: Therapy Vitals Temp: 97.8 F (36.6 C) Pulse Rate: 91 BP: (!) 115/54 Patient Position (if appropriate): Lying Oxygen Therapy SpO2: 100 % O2 Device: Room Air Pain:   ADL: ADL Eating: Minimal assistance,Minimal cueing Where Assessed-Eating: Bed level Grooming: Setup Where Assessed-Grooming: Sitting at sink Upper Body Bathing: Minimal assistance,Minimal cueing Where Assessed-Upper Body Bathing: Edge of bed Lower Body Bathing: Moderate assistance Where Assessed-Lower Body Bathing: Edge of bed Upper Body Dressing: Minimal assistance,Minimal cueing Where Assessed-Upper Body  Dressing: Edge of bed Lower Body Dressing: Moderate assistance,Minimal cueing Where Assessed-Lower Body Dressing: Edge of bed Toileting: Maximal assistance,Minimal cueing Where Assessed-Toileting: Bedside Commode Toilet Transfer: Maximal assistance Toilet Transfer Method: Stand pivot Toilet Transfer Equipment: Drop arm bedside commode Vision   Perception    Praxis   Exercises:   Other Treatments:     Therapy/Group: Individual Therapy  [name] 12/25/2019, 6:52 AM

## 2019-12-25 NOTE — Progress Notes (Signed)
Occupational Therapy Session Note  Patient Details  Name: Melissa Colon MRN: [id number] Date of Birth: 06/28/1960  Today's Date: 12/25/2019 OT Individual Time: 1415-1530 OT Individual Time Calculation (min): 75 min    Short Term Goals: Week 1:  OT Short Term Goal 1 (Week 1): Pt will don shirt with CGA OT Short Term Goal 1 - Progress (Week 1): Met OT Short Term Goal 2 (Week 1): Pt will require no more than min cueing for positioning of her RUE OT Short Term Goal 2 - Progress (Week 1): Met OT Short Term Goal 3 (Week 1): Pt will complete toilet transfer with mod A OT Short Term Goal 3 - Progress (Week 1): Met  Skilled Therapeutic Interventions/Progress Updates:    1;1. Pt received in bed agreeable to OT and no pain reported. Pt completes BADL at sink level. Pt completes bathing with A to wash LUE with HOH A of RUE for NMR and good activaiton but poor grasp/release control. Pt able to wash other body parts in seated crossing into figure 4 for LB bathing and sit to stand at sink with MIN A and R knee block provided with pt washing buttocks in stance with MIN VC for L weight shift to midline. Pt dons shirt with S and VC for hemi dressing doffing technique. Pants donned with MIN A (MOD A for standing balance) and A to pull up R hip. Pt able to don socks after demo of 1 anded technique and shoes after installation of elastic laces and shoe funnel demo with S for footwear overall. Exited session with pt seated in bed, exit alarm on and call light in reach   Therapy Documentation Precautions:  Precautions Precautions: Fall Precaution Comments: R hemiparesis and incoordination, pushing to R in stand Restrictions Weight Bearing Restrictions: No General:   Vital Signs:   Pain:   ADL: ADL Eating: Minimal assistance,Minimal cueing Where Assessed-Eating: Bed level Grooming: Setup Where Assessed-Grooming: Sitting at sink Upper Body Bathing: Minimal assistance,Minimal cueing Where  Assessed-Upper Body Bathing: Edge of bed Lower Body Bathing: Moderate assistance Where Assessed-Lower Body Bathing: Edge of bed Upper Body Dressing: Minimal assistance,Minimal cueing Where Assessed-Upper Body Dressing: Edge of bed Lower Body Dressing: Moderate assistance,Minimal cueing Where Assessed-Lower Body Dressing: Edge of bed Toileting: Maximal assistance,Minimal cueing Where Assessed-Toileting: Bedside Commode Toilet Transfer: Maximal assistance Toilet Transfer Method: Stand pivot Toilet Transfer Equipment: Drop arm bedside commode Vision   Perception    Praxis   Exercises:   Other Treatments:     Therapy/Group: Individual Therapy  [name] 12/25/2019, 2:19 PM

## 2019-12-26 ENCOUNTER — Inpatient Hospital Stay (HOSPITAL_COMMUNITY): Payer: Self-pay

## 2019-12-26 MED ORDER — AMLODIPINE BESYLATE 2.5 MG PO TABS
2.5000 mg | ORAL_TABLET | Freq: Every day | ORAL | Status: DC
  Administered 2019-12-27 – 2020-01-05 (×10): 2.5 mg via ORAL
  Filled 2019-12-26 (×10): qty 1

## 2019-12-26 NOTE — Progress Notes (Signed)
[place] PHYSICAL MEDICINE & REHABILITATION PROGRESS NOTE   Subjective/Complaints: No complaints this morning States she is doing ok Denies pain, constipation Vitals stable  ROS: Patient denies fever, rash, sore throat, blurred vision, nausea, vomiting, diarrhea, cough, shortness of breath or chest pain, joint or back pain, headache, or mood change.      Objective:   No results found. No results for input(s): WBC, HGB, HCT, PLT in the last 72 hours. No results for input(s): NA, K, CL, CO2, GLUCOSE, BUN, CREATININE, CALCIUM in the last 72 hours.  Intake/Output Summary (Last 24 hours) at 12/26/2019 1210 Last data filed at 12/26/2019 0853 Gross per 24 hour  Intake 720 ml  Output --  Net 720 ml    Physical Exam: Vital Signs Blood pressure 116/69, pulse 88, temperature 98.6 F (37 C), resp. rate 17, height 5\' 2"  (1.575 m), weight 46.7 kg, SpO2 99 %.  Gen: no distress, normal appearing HEENT: oral mucosa pink and moist, NCAT Cardio: Reg rate Chest: normal effort, normal rate of breathing Abd: soft, non-distended Ext: no edema Skin: intact Neuro:  right central 7, mild dysarthria.  Dysconjugate gaze still present.  Provides name and age. more alert and aware. RUE 3- SA, EE, EF, 4-/5 hand grip and WE RLE 3+/5 HF, KE, 4/5 DF, PF. Advancing right leg better during gait but still needs some physical cueing.  Decreased sensation throughout right side to LT/PP.     Assessment/Plan: 1. Functional deficits which require 3+ hours per day of interdisciplinary therapy in a comprehensive inpatient rehab setting.  Physiatrist is providing close team supervision and 24 hour management of active medical problems listed below.  Physiatrist and rehab team continue to assess barriers to discharge/monitor patient progress toward functional and medical goals  Care Tool:  Bathing    Body parts bathed by patient: Right arm,Left arm,Chest,Abdomen,Front perineal area,Right upper leg,Left  upper leg,Face,Right lower leg,Left lower leg   Body parts bathed by helper: Buttocks     Bathing assist Assist Level: Minimal Assistance - Patient > 75%     Upper Body Dressing/Undressing Upper body dressing   What is the patient wearing?: Pull over shirt    Upper body assist Assist Level: Contact Guard/Touching assist    Lower Body Dressing/Undressing Lower body dressing      What is the patient wearing?: Incontinence brief     Lower body assist Assist for lower body dressing: Moderate Assistance - Patient 50 - 74%     Toileting Toileting    Toileting assist Assist for toileting: Dependent - Patient 0% (Stedy 1 assist)     Transfers Chair/bed transfer  Transfers assist     Chair/bed transfer assist level: Moderate Assistance - Patient 50 - 74% (squat pivot)     Locomotion Ambulation   Ambulation assist      Assist level: Maximal Assistance - Patient 25 - 49% Assistive device: Walker-rolling Max distance: 22ft   Walk 10 feet activity   Assist  Walk 10 feet activity did not occur: Safety/medical concerns (R hemi, R pusher, decreased motor planning, R inattention)  Assist level: Maximal Assistance - Patient 25 - 49% Assistive device: Walker-rolling   Walk 50 feet activity   Assist Walk 50 feet with 2 turns activity did not occur: Safety/medical concerns         Walk 150 feet activity   Assist Walk 150 feet activity did not occur: Safety/medical concerns         Walk 10 feet on uneven surface  activity   Assist Walk 10 feet on uneven surfaces activity did not occur: Safety/medical concerns         Wheelchair     Assist Will patient use wheelchair at discharge?: Yes Type of Wheelchair: Manual    Wheelchair assist level: Dependent - Patient 0% (patient stated she couldn't and did not know how, required skilled intervention to propel w/c with present deficits) Max wheelchair distance: >150 ft    Wheelchair 50 feet with 2  turns activity    Assist        Assist Level: Dependent - Patient 0%   Wheelchair 150 feet activity     Assist      Assist Level: Dependent - Patient 0%   Blood pressure 116/69, pulse 88, temperature 98.6 F (37 C), resp. rate 17, height 5\' 2"  (1.575 m), weight 46.7 kg, SpO2 99 %.  Medical Problem List and Plan: 1.  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  Antithrombotics: -DVT/anticoagulation: Subcutaneous heparin             -antiplatelet therapy: N/A 3. Pain Management: Tylenol as needed- for occasional left sided headache  12/6- tylenol for vascular h/a   -added chlorhexidine, oral jel for mouth.   Pt feels mouthwash helped   - Will need outpt dentistry f/u at some point (doesn't have dentist) 4. Mood: Provide emotional support             -antipsychotic agents: N/A  - prn trazodone to assist with sleep 5. Neuropsych: This patient is capable of making decisions on her own behalf. 6. Skin/Wound Care: Routine skin checks 7. Fluids/Electrolytes/Nutrition: encouraging PO 8.  Hypertension.  Norvasc 5 mg daily.    12/11 well controlled. Has been soft some reads. Decrease Norvasc to 2.5mg .  9.  Tobacco abuse.  NicoDerm patch.  Provide counseling 10.  History of alcohol use.  Provide counseling. Continue folic acid supplement 11. Tachycardic to 103 on admission-      resolved  12/11: HR well controlled at 88- monitor TID.  12. Urinary retention: pvr's generally less than 200, urine still cloudy  12/2-ucx with 100k aerococcus urinae.    -amoxil 12/2-12/4 completed 13. Acute blood loss anemia: Monitor Hgb weekly. 11.8 on 11/30 14. Multiple BM per day: stopped  senna-docusate--formed stool 12/3  12/7  Disimpacted   12/8 -resumed senna-s, miralax  12/10-soft bm this morning  12/11: denies feeling constipation: continue to monitor.     LOS: 12 days A FACE TO FACE EVALUATION WAS PERFORMED  14/11 P  12/26/2019, 12:10 PM

## 2019-12-26 NOTE — Progress Notes (Signed)
Occupational Therapy Session Note  Patient Details  Name: Melissa Colon MRN: [id number] Date of Birth: 1960-01-25  Today's Date: 12/26/2019 OT Individual Time: 0915-1000 OT Individual Time Calculation (min): 45 min    Short Term Goals: Week 1:  OT Short Term Goal 1 (Week 1): Pt will don shirt with CGA OT Short Term Goal 1 - Progress (Week 1): Met OT Short Term Goal 2 (Week 1): Pt will require no more than min cueing for positioning of her RUE OT Short Term Goal 2 - Progress (Week 1): Met OT Short Term Goal 3 (Week 1): Pt will complete toilet transfer with mod A OT Short Term Goal 3 - Progress (Week 1): Met  Skilled Therapeutic Interventions/Progress Updates:    1;1. Pt received in bed agreeable to OT. Reinforced wear schedule with pt and RN for elbow patch to doff at night to give skin break. Both verbalized understanding. Pt completes supine>sitting and dons B shoes with shoe funnel and spontaneous incorporation of RUE into task. Discoordination still impacting function but overall shoes donned with S. In tx gym pt completes massed practice of squat pivot transfers with R knee block and reciprocal scooting/lateral scooting along mat for body awareness/mechanics and head hips relationship. Pt eventually consistent with MIN A squat pivots. Exited session with pt seated in w/c, exit alarm on and call light in reach   Therapy Documentation Precautions:  Precautions Precautions: Fall Precaution Comments: R hemiparesis and incoordination, pushing to R in stand Restrictions Weight Bearing Restrictions: No General:   Vital Signs:   Pain:   ADL: ADL Eating: Minimal assistance,Minimal cueing Where Assessed-Eating: Bed level Grooming: Setup Where Assessed-Grooming: Sitting at sink Upper Body Bathing: Minimal assistance,Minimal cueing Where Assessed-Upper Body Bathing: Edge of bed Lower Body Bathing: Moderate assistance Where Assessed-Lower Body Bathing: Edge of bed Upper Body  Dressing: Minimal assistance,Minimal cueing Where Assessed-Upper Body Dressing: Edge of bed Lower Body Dressing: Moderate assistance,Minimal cueing Where Assessed-Lower Body Dressing: Edge of bed Toileting: Maximal assistance,Minimal cueing Where Assessed-Toileting: Bedside Commode Toilet Transfer: Maximal assistance Toilet Transfer Method: Stand pivot Toilet Transfer Equipment: Drop arm bedside commode Vision   Perception    Praxis   Exercises:   Other Treatments:     Therapy/Group: Individual Therapy  [name] 12/26/2019, 10:02 AM

## 2019-12-26 NOTE — Progress Notes (Signed)
Occupational Therapy Session Note  Patient Details  Name: Melissa Colon MRN: [id number] Date of Birth: Jan 07, 1961  Today's Date: 12/26/2019 OT Individual Time: 1420-1505 OT Individual Time Calculation (min): 45 min    Short Term Goals: Week 1:  OT Short Term Goal 1 (Week 1): Pt will don shirt with CGA OT Short Term Goal 1 - Progress (Week 1): Met OT Short Term Goal 2 (Week 1): Pt will require no more than min cueing for positioning of her RUE OT Short Term Goal 2 - Progress (Week 1): Met OT Short Term Goal 3 (Week 1): Pt will complete toilet transfer with mod A OT Short Term Goal 3 - Progress (Week 1): Met  Skilled Therapeutic Interventions/Progress Updates:    1:1. Pt received in bed agreeable to OT. Pt reporting need to toilet. Pt uses grab bar for SPT to toilet to L with MOD A and facilitation at R knee for clothing management. Pt completes bladder void on toilet with S for sitting balance and VC for BUE use to rip toilet paper d/t R inattention. Pt transfers back to w/c with grab bar as stated above to L with MAX A. Pt completes seated NMR/scapular/shoulder strengthening activites in seated outside courtyard for mood support/change of scenery. Educated on completion of self ROM to perform during breaks in therapy. Pt verbalized understanding. Exited session with pt seated in bed, exit alarm on and call light in reach   Therapy Documentation Precautions:  Precautions Precautions: Fall Precaution Comments: R hemiparesis and incoordination, pushing to R in stand Restrictions Weight Bearing Restrictions: No General:   Vital Signs:   Pain:   ADL: ADL Eating: Minimal assistance,Minimal cueing Where Assessed-Eating: Bed level Grooming: Setup Where Assessed-Grooming: Sitting at sink Upper Body Bathing: Minimal assistance,Minimal cueing Where Assessed-Upper Body Bathing: Edge of bed Lower Body Bathing: Moderate assistance Where Assessed-Lower Body Bathing: Edge of  bed Upper Body Dressing: Minimal assistance,Minimal cueing Where Assessed-Upper Body Dressing: Edge of bed Lower Body Dressing: Moderate assistance,Minimal cueing Where Assessed-Lower Body Dressing: Edge of bed Toileting: Maximal assistance,Minimal cueing Where Assessed-Toileting: Bedside Commode Toilet Transfer: Maximal assistance Toilet Transfer Method: Stand pivot Toilet Transfer Equipment: Drop arm bedside commode Vision   Perception    Praxis   Exercises:   Other Treatments:     Therapy/Group: Individual Therapy  [name] 12/26/2019, 12:59 PM

## 2019-12-27 NOTE — Progress Notes (Signed)
PHYSICAL MEDICINE & REHABILITATION PROGRESS NOTE   Subjective/Complaints: No complaints this morning She is feeling fine Had some pain on left side of her head, but this is gone now Vitals stable  ROS: Patient denies fever, rash, sore throat, blurred vision, nausea, vomiting, diarrhea, cough, shortness of breath or chest pain, joint or back pain, headache, or mood change.      Objective:   No results found. No results for input(s): WBC, HGB, HCT, PLT in the last 72 hours. No results for input(s): NA, K, CL, CO2, GLUCOSE, BUN, CREATININE, CALCIUM in the last 72 hours.  Intake/Output Summary (Last 24 hours) at 12/27/2019 1531 Last data filed at 12/27/2019 0819 Gross per 24 hour  Intake 600 ml  Output --  Net 600 ml    Physical Exam: Vital Signs Blood pressure 128/82, pulse 98, temperature 98 F (36.7 C), resp. rate 18, height 5\' 2"  (1.575 m), weight 46.7 kg, SpO2 97 %.  Gen: no distress, normal appearing HEENT: oral mucosa pink and moist, NCAT Cardio: Reg rate Chest: normal effort, normal rate of breathing Abd: soft, non-distended Ext: no edema Skin: intact Neuro:  right central 7, mild dysarthria.  Dysconjugate gaze still present.  Provides name and age. more alert and aware. RUE 3- SA, EE, EF, 4-/5 hand grip and WE RLE 3+/5 HF, KE, 4/5 DF, PF. Advancing right leg better during gait but still needs some physical cueing.  Decreased sensation throughout right side to LT/PP.     Assessment/Plan: 1. Functional deficits which require 3+ hours per day of interdisciplinary therapy in a comprehensive inpatient rehab setting.  Physiatrist is providing close team supervision and 24 hour management of active medical problems listed below.  Physiatrist and rehab team continue to assess barriers to discharge/monitor patient progress toward functional and medical goals  Care Tool:  Bathing    Body parts bathed by patient: Right arm,Left arm,Chest,Abdomen,Front  perineal area,Right upper leg,Left upper leg,Face,Right lower leg,Left lower leg   Body parts bathed by helper: Buttocks     Bathing assist Assist Level: Minimal Assistance - Patient > 75%     Upper Body Dressing/Undressing Upper body dressing   What is the patient wearing?: Pull over shirt    Upper body assist Assist Level: Contact Guard/Touching assist    Lower Body Dressing/Undressing Lower body dressing      What is the patient wearing?: Incontinence brief     Lower body assist Assist for lower body dressing: Moderate Assistance - Patient 50 - 74%     Toileting Toileting    Toileting assist Assist for toileting: Dependent - Patient 0% (Stedy 1 assist)     Transfers Chair/bed transfer  Transfers assist     Chair/bed transfer assist level: Moderate Assistance - Patient 50 - 74% (squat pivot)     Locomotion Ambulation   Ambulation assist      Assist level: Maximal Assistance - Patient 25 - 49% Assistive device: Walker-rolling Max distance: 85ft   Walk 10 feet activity   Assist  Walk 10 feet activity did not occur: Safety/medical concerns (R hemi, R pusher, decreased motor planning, R inattention)  Assist level: Maximal Assistance - Patient 25 - 49% Assistive device: Walker-rolling   Walk 50 feet activity   Assist Walk 50 feet with 2 turns activity did not occur: Safety/medical concerns         Walk 150 feet activity   Assist Walk 150 feet activity did not occur: Safety/medical concerns  Walk 10 feet on uneven surface  activity   Assist Walk 10 feet on uneven surfaces activity did not occur: Safety/medical concerns         Wheelchair     Assist Will patient use wheelchair at discharge?: Yes Type of Wheelchair: Manual    Wheelchair assist level: Dependent - Patient 0% (patient stated she couldn't and did not know how, required skilled intervention to propel w/c with present deficits) Max wheelchair distance: >150  ft    Wheelchair 50 feet with 2 turns activity    Assist        Assist Level: Dependent - Patient 0%   Wheelchair 150 feet activity     Assist      Assist Level: Dependent - Patient 0%   Blood pressure 128/82, pulse 98, temperature 98 F (36.7 C), resp. rate 18, height 5\' 2"  (1.575 m), weight 46.7 kg, SpO2 97 %.  Medical Problem List and Plan: 1.  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  Antithrombotics: -DVT/anticoagulation: Subcutaneous heparin             -antiplatelet therapy: N/A 3. Pain Management: Tylenol as needed- for occasional left sided headache  12/6- tylenol for vascular h/a   -added chlorhexidine, oral jel for mouth.   Pt feels mouthwash helped   - Will need outpt dentistry f/u at some point (doesn't have dentist) 4. Mood: Provide emotional support             -antipsychotic agents: N/A  - prn trazodone to assist with sleep 5. Neuropsych: This patient is capable of making decisions on her own behalf. 6. Skin/Wound Care: Routine skin checks 7. Fluids/Electrolytes/Nutrition: encouraging PO 8.  Hypertension.  Norvasc 5 mg daily.    12/11 well controlled. Has been soft some reads. Decrease Norvasc to 2.5mg .   12/12: excellent control, continue norvasc 2.5mg  9.  Tobacco abuse.  NicoDerm patch.  Provide counseling 10.  History of alcohol use.  Provide counseling. Continue folic acid supplement 11. Tachycardic to 103 on admission-      resolved  12/12: HR elevated at 98- monitor TID.  12. Urinary retention: pvr's generally less than 200, urine still cloudy  12/2-ucx with 100k aerococcus urinae.    -amoxil 12/2-12/4 completed 13. Acute blood loss anemia: Monitor  Hgb weekly. 11.8 on 11/30 14. Multiple BM per day: stopped senna-docusate--formed stool 12/3  12/7  Disimpacted   12/8 -resumed senna-s, miralax  12/10-soft bm this morning  12/11-12: denies feeling constipation: continue to monitor.     LOS: 13 days A FACE TO FACE EVALUATION WAS PERFORMED  03-19-1987  12/27/2019, 3:31 PM

## 2019-12-28 ENCOUNTER — Inpatient Hospital Stay (HOSPITAL_COMMUNITY): Payer: Self-pay

## 2019-12-28 ENCOUNTER — Inpatient Hospital Stay (HOSPITAL_COMMUNITY): Payer: Self-pay | Admitting: Occupational Therapy

## 2019-12-28 NOTE — Progress Notes (Signed)
Physical Therapy Session Note  Patient Details  Name: Melissa Colon MRN: [id number] Date of Birth: 08-Oct-1960  Today's Date: 12/28/2019 PT Individual Time: 1335-1435 PT Individual Time Calculation (min): 60 min   Short Term Goals: Week 2:  PT Short Term Goal 1 (Week 2): Patient will perform basic transfers with min A consistently using LRAD. PT Short Term Goal 2 (Week 2): Patient will ambulate >50 ft with +1-2 assist using LRAD. PT Short Term Goal 3 (Week 2): Patient will propel w/c >50 ft in household set-up with distant supervision.  Skilled Therapeutic Interventions/Progress Updates:     Patient in bed with her husband at bedside upon PT arrival. Patient alert and agreeable to PT session. Patient denied pain during session. Patient reported poor sleep last night due to catheterization. Stated that she usually holds her urine until she cannot hold it any more at baseline. Educated on importance of regular voiding and fully emptying her bladder. Patient requesting to perform toileting after dinner, RN made aware.   Therapeutic Activity: Bed Mobility: Patient performed supine to/from sit with min A for trunk and R lower extremity management. Provided verbal cues for setting her bottom elbow in side-lying to push up to sitting and brining her R knee to chest to lift her leg onto the bed, required assist due to poor coordination/proprioception of R limb. Transfers: Patient performed squat pivot bed<>w/c and w/c<>mat table with min-mod A for boosting hips to clear during transfer. Provided verbal cues for L hand placement, head-hips relationship, and initiation. Blocked R foot and knee throughout transfers She performed stand pivot w/c<>BSC over the toilet using a grab bar to perform a toilet transfer with mod-max A due to poor motor planning. Provided verbal cues for foot placement, scooting forward, hand placement and sequencing for stepping, transfer to w/c better due to improved sequencing  and motor planning with cues. Patient was continent of bowl and bladder during toileting. Performed peri-care with set-up assist and follow up wipe for thoroughness.   Wheelchair Mobility:  Patient propelled a lightweight manual wheelchair 122 feet and an ultralight wheelchair 55 feet limited by mental fatigue demonstrated decreased motor planning with propulsion. Propelled with supervision using L hemi-technique throughout. Provided verbal cues for use of L foot to steer and turning technique. Provided patient with ultralight w/c to trial prior to w/c evaluation scheduled for tomorrow.  Patient in bed at end of session with breaks locked, bed alarm set, and all needs within reach.    Therapy Documentation Precautions:  Precautions Precautions: Fall Precaution Comments: R hemiparesis and incoordination, pushing to R in stand Restrictions Weight Bearing Restrictions: No   Therapy/Group: Individual Therapy   L  PT, DPT  12/28/2019, 5:06 PM

## 2019-12-28 NOTE — Progress Notes (Addendum)
Speech Language Pathology Weekly Progress and Session Note  Patient Details  Name: Melissa Colon MRN: [id number] Date of Birth: 01-12-1961  Beginning of progress report period: December 21, 2019 End of progress report period: December 28, 2019  Today's Date: 12/28/2019 SLP Individual Time: 1105-1200 SLP Individual Time Calculation (min): 55 min  Short Term Goals: Week 2: SLP Short Term Goal 1 (Week 2): Patient will recall new, daily information with supervision A verbal cues for use of memory compensatory strategies. SLP Short Term Goal 1 - Progress (Week 2): Met SLP Short Term Goal 2 (Week 2): Patient will utilize speech intelligibility strategies at the conversation level ot achieve ~90% intelligibility with supervision verbal cues. SLP Short Term Goal 2 - Progress (Week 2): Met SLP Short Term Goal 3 (Week 2): Patient will utilize word finding strategies in higher-level language tasks (ex: divergent, convergent...etc) with supervision A verbal cues. SLP Short Term Goal 3 - Progress (Week 2): Met SLP Short Term Goal 4 (Week 2): Patient will demonstrate functional problem solving for complex tasks with Min verbal cues. SLP Short Term Goal 4 - Progress (Week 2): Met    New Short Term Goals: Week 3: SLP Short Term Goal 1 (Week 3): STG=LTG due to short ELOS (expected 12/21)  Weekly Progress Updates:  Pt has made consistent progress meeting 4 out 4 goals, currently requiring min-supervision A. Pt did struggle with novel complex problem solving tasks, however it appears due to memory and visual deficits more so than problem solving, SLP will continue to address. Pt can demonstrate 90% intelligibility in conversation and can recall strategies with aid, although this is impacted by behavior/motivation. Pt would benefit from skilled ST services in order to maximize functional independence and reduce burden of care, requiring supervision at discharge with continued skilled ST services.      Intensity: Minumum of 1-2 x/day, 30 to 90 minutes Frequency: 3 to 5 out of 7 days Duration/Length of Stay: 12/21 Treatment/Interventions: Cognitive remediation/compensation;Internal/external aids;Speech/Language facilitation;Therapeutic Activities;Environmental controls;Cueing hierarchy;Functional tasks;Patient/family education   Daily Session  Skilled Therapeutic Interventions: Skilled ST services focused on cognitive and swallow skills. Pt's OT this am had reported oral control issues during oral care and questioned swallowing deficits. Pt reported it was mainly baseline. SLP did have pt consume dys 2 and dys 3 texture with thin liquids via straw snack, inwhich swallow function appeared WFL. SLP facilitated complex problem solving skills in novel deductive reasoning task, pt required mod A overall which appeared primarily due to right visual deficit and memory versus solely problem solving. Pt did required min A verbal cues for complex problem solving skills in task. Pt demonstrated recall of speech intelligibility strategies "SLOP" mod I at the end of the session. Pt was left in room with call bell within reach and bed alarm set. SLP recommends to continue skilled services.    General    Pain Pain Assessment Pain Score: 0-No pain  Therapy/Group: Individual Therapy    [facility] 12/28/2019, 4:22 PM

## 2019-12-28 NOTE — Progress Notes (Signed)
Occupational Therapy Session Note  Patient Details  Name: Melissa Colon MRN: [id number] Date of Birth: [birth year]  Today's Date: 12/28/2019 OT Individual Time: 4076-8088 OT Individual Time Calculation (min): 45 min    Short Term Goals: Week 2:  OT Short Term Goal 1 (Week 2): Pt will complete toileting tasks with mod A OT Short Term Goal 2 (Week 2): Pt will appropriately incorporate her RUE into grooming tasks with set up assist OT Short Term Goal 3 (Week 2): Pt will complete toilet transfer with min A  Skilled Therapeutic Interventions/Progress Updates:    Pt received supine with no c/o pain, agreeable to OT session. Pt completed bed mobility with close supervision- good improvement! Pt donned shoes with mod A overall, using shoe funnel. Pt attempted squat pivot x3, with her R leg shooting into extension and causing transfer to be unsafe and requiring OT assistance to safely return to bed. Pt was successful on last attempt and required mod A overall with facilitation at the [facility] [place]. Pt was taken to the therapy gym via w/c. Stand pivot to the w/c with mod A. Pt sat EOM and completed functional reaching task with her RUE, holding a marker with a gross grasp and reaching to the mirror placed anterior. Pt required mod facilitation for improving coordination and shoulder flexion. Pt returned to her room and was transferred back to supine. Pt was left supine with all needs met, bed alarm set.   Therapy Documentation Precautions:  Precautions Precautions: Fall Precaution Comments: R hemiparesis and incoordination, pushing to R in stand Restrictions Weight Bearing Restrictions: No  Therapy/Group: Individual Therapy  Curtis Sites 12/28/2019, 6:56 AM

## 2019-12-28 NOTE — Progress Notes (Signed)
Occupational Therapy Session Note  Patient Details  Name: Melissa Colon MRN: [id number] Date of Birth: 07/11/1960  Today's Date: 12/28/2019 OT Individual Time: 9563-8756 OT Individual Time Calculation (min): 41 min   Skilled Therapeutic Interventions/Progress Updates:    Pt greeted in bed, premedicated with tylenol for HA per report. When asked if she needed to use the restroom, pt responded "yes!" Supine<sit completed with Min A for fully scooting the Rt hip forward EOB. Mod A for squat pivot<w/c<drop arm BSC over toilet. Mod A for sit<stands and Mod-Max A for dynamic standing balance when she assisted with lowering clothing due to Rt pushing. Pt with continent bladder void while on toilet, hygiene completed with setup while seated and the same balance assistance required when she assisted OT with elevating clothing before returning to the w/c. Note that she required vcs to not rely on the grab bar for standing support, vcs for hand placement and neutral alignment in sitting/standing. While sitting at the sink, worked on [employer] UE functional use when completing hand washing, oral care (using rinse), and doffing soiled shirt/donning clean one. Mod A for squat pivot<bed and she transitioned to supine with supervision assist. Able to boost herself up in bed using LEs once OT stabilized the Rt foot. Pt remained in bed at close of session, left her with all needs within reach and bed alarm set.    Therapy Documentation Precautions:  Precautions Precautions: Fall Precaution Comments: R hemiparesis and incoordination, pushing to R in stand Restrictions Weight Bearing Restrictions: No ADL: ADL Eating: Minimal assistance,Minimal cueing Where Assessed-Eating: Bed level Grooming: Setup Where Assessed-Grooming: Sitting at sink Upper Body Bathing: Minimal assistance,Minimal cueing Where Assessed-Upper Body Bathing: Edge of bed Lower Body Bathing: Moderate assistance Where Assessed-Lower Body Bathing:  Edge of bed Upper Body Dressing: Minimal assistance,Minimal cueing Where Assessed-Upper Body Dressing: Edge of bed Lower Body Dressing: Moderate assistance,Minimal cueing Where Assessed-Lower Body Dressing: Edge of bed Toileting: Maximal assistance,Minimal cueing Where Assessed-Toileting: Bedside Commode Toilet Transfer: Maximal assistance Toilet Transfer Method: Stand pivot Toilet Transfer Equipment: Drop arm bedside commode      Therapy/Group: Individual Therapy   A  12/28/2019, 4:06 PM

## 2019-12-28 NOTE — Progress Notes (Signed)
[place] PHYSICAL MEDICINE & REHABILITATION PROGRESS NOTE   Subjective/Complaints: Woke up early to go to bathroom. Said her head bothered her a little more this weekend. Trying to catch a nap   ROS: Patient denies fever, rash, sore throat, blurred vision, nausea, vomiting, diarrhea, cough, shortness of breath or chest pain, joint or back pain,  or mood change. .      Objective:   No results found. No results for input(s): WBC, HGB, HCT, PLT in the last 72 hours. No results for input(s): NA, K, CL, CO2, GLUCOSE, BUN, CREATININE, CALCIUM in the last 72 hours.  Intake/Output Summary (Last 24 hours) at 12/28/2019 0925 Last data filed at 12/28/2019 0844 Gross per 24 hour  Intake 720 ml  Output --  Net 720 ml    Physical Exam: Vital Signs Blood pressure 118/64, pulse 96, temperature 97.9 F (36.6 C), temperature source Oral, resp. rate 16, height 5\' 2"  (1.575 m), weight 46.7 kg, SpO2 98 %.  Constitutional: No distress . Vital signs reviewed. HEENT: EOMI, oral membranes moist Neck: supple Cardiovascular: RRR without murmur. No JVD    Respiratory/Chest: CTA Bilaterally without wheezes or rales. Normal effort    GI/Abdomen: BS +, non-tender, non-distended Ext: no clubbing, cyanosis, or edema Psych: pleasant , a little more subdued today Neuro:  right central 7, mild dysarthria.  Dysconjugate gaze still present.  Provides name and age. more alert and aware. RUE 3- SA, EE, EF, 4-/5 hand grip and WE RLE 3+/5 HF, KE, 4/5 DF, PF. Initiates use of right side better Decreased sensation throughout right side to LT/PP.     Assessment/Plan: 1. Functional deficits which require 3+ hours per day of interdisciplinary therapy in a comprehensive inpatient rehab setting.  Physiatrist is providing close team supervision and 24 hour management of active medical problems listed below.  Physiatrist and rehab team continue to assess barriers to discharge/monitor patient progress toward functional  and medical goals  Care Tool:  Bathing    Body parts bathed by patient: Right arm,Left arm,Chest,Abdomen,Front perineal area,Right upper leg,Left upper leg,Face,Right lower leg,Left lower leg   Body parts bathed by helper: Buttocks     Bathing assist Assist Level: Minimal Assistance - Patient > 75%     Upper Body Dressing/Undressing Upper body dressing   What is the patient wearing?: Pull over shirt    Upper body assist Assist Level: Contact Guard/Touching assist    Lower Body Dressing/Undressing Lower body dressing      What is the patient wearing?: Incontinence brief     Lower body assist Assist for lower body dressing: Moderate Assistance - Patient 50 - 74%     Toileting Toileting    Toileting assist Assist for toileting: Dependent - Patient 0% (Stedy 1 assist)     Transfers Chair/bed transfer  Transfers assist     Chair/bed transfer assist level: Moderate Assistance - Patient 50 - 74% (squat pivot)     Locomotion Ambulation   Ambulation assist      Assist level: Maximal Assistance - Patient 25 - 49% Assistive device: Walker-rolling Max distance: 109ft   Walk 10 feet activity   Assist  Walk 10 feet activity did not occur: Safety/medical concerns (R hemi, R pusher, decreased motor planning, R inattention)  Assist level: Maximal Assistance - Patient 25 - 49% Assistive device: Walker-rolling   Walk 50 feet activity   Assist Walk 50 feet with 2 turns activity did not occur: Safety/medical concerns  Walk 150 feet activity   Assist Walk 150 feet activity did not occur: Safety/medical concerns         Walk 10 feet on uneven surface  activity   Assist Walk 10 feet on uneven surfaces activity did not occur: Safety/medical concerns         Wheelchair     Assist Will patient use wheelchair at discharge?: Yes Type of Wheelchair: Manual    Wheelchair assist level: Dependent - Patient 0% (patient stated she couldn't and  did not know how, required skilled intervention to propel w/c with present deficits) Max wheelchair distance: >150 ft    Wheelchair 50 feet with 2 turns activity    Assist        Assist Level: Dependent - Patient 0%   Wheelchair 150 feet activity     Assist      Assist Level: Dependent - Patient 0%   Blood pressure 118/64, pulse 96, temperature 97.9 F (36.6 C), temperature source Oral, resp. rate 16, height 5\' 2"  (1.575 m), weight 46.7 kg, SpO2 98 %.  Medical Problem List and Plan: 1.  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  Antithrombotics: -DVT/anticoagulation: Subcutaneous heparin             -antiplatelet therapy: N/A 3. Pain Management: Tylenol as needed- for occasional left sided headache  12/6- tylenol for vascular h/a   -added chlorhexidine, oral jel for mouth.   Pt feels mouthwash helped   - Will need outpt dentistry f/u at some point (doesn't have dentist)  12/13 monitor headache pattern, had been improving 4. Mood: Provide emotional support             -antipsychotic agents: N/A  - prn trazodone to assist with sleep 5. Neuropsych: This patient is capable of making decisions on her own behalf. 6. Skin/Wound Care: Routine skin checks 7. Fluids/Electrolytes/Nutrition: encouraging PO 8.  Hypertension.  Norvasc 5 mg daily.    12/11 well controlled. Has been soft some reads. Decrease Norvasc to 2.5mg .   12/13: excellent control, continue norvasc 2.5mg  9.  Tobacco abuse.  NicoDerm patch.  Provide counseling 10.  History of alcohol use.  Provide counseling. Continue folic acid supplement 11. Tachycardic to 103 on admission-      resolved  12/13: HR in 90's- monitor TID.  12. Urinary retention: pvr's generally less than  200, urine still cloudy  12/2-ucx with 100k aerococcus urinae.    -amoxil 12/2-12/4 completed 13. Acute blood loss anemia: Monitor Hgb weekly. 11.8 on 11/30 14. Multiple BM per day: stopped senna-docusate--formed stool 12/3  12/7  Disimpacted   12/8 -resumed senna-s, miralax  12/10-soft bm this morning  12/11-13: denies feeling constipation: continue to monitor.     LOS: 14 days A FACE TO FACE EVALUATION WAS PERFORMED  01-06-1984 12/28/2019, 9:25 AM

## 2019-12-29 ENCOUNTER — Inpatient Hospital Stay (HOSPITAL_COMMUNITY): Payer: Self-pay

## 2019-12-29 ENCOUNTER — Inpatient Hospital Stay (HOSPITAL_COMMUNITY): Payer: Self-pay | Admitting: Speech Pathology

## 2019-12-29 MED ORDER — ENSURE ENLIVE PO LIQD
237.0000 mL | Freq: Three times a day (TID) | ORAL | Status: DC
  Administered 2019-12-29 – 2020-01-03 (×16): 237 mL via ORAL
  Filled 2019-12-29 (×4): qty 237

## 2019-12-29 NOTE — Progress Notes (Signed)
Occupational Therapy Weekly Progress Note  Patient Details  Name: Melissa Colon MRN: [id number] Date of Birth: 1961-01-09  Beginning of progress report period: December 22, 2019 End of progress report period: December 29, 2019  Today's Date: 12/29/2019 OT Individual Time: 0383-3383 OT Individual Time Calculation (min): 75 min    Patient has met 1 of 3 short term goals.  Pt has made slow progress this reporting period toward her OT goals. Her RUE remains ataxic and her R neglect/inattention profoundly affects her ability to complete transfers and ADLs. Pt requires mod-max A for toileting tasks, CGA UB dressing, and mod A LB dressing. Pt has made progress in her R attention and in donning shoes with AE. Pt remains highly motivated to participate in all sessions and to continue working toward her OT goals. Goals have been downgraded to min A overall.   Patient continues to demonstrate the following deficits: muscle weakness, ataxia, decreased coordination and decreased motor planning, decreased attention to right and right side neglect, decreased attention, decreased awareness, decreased safety awareness and delayed processing and decreased sitting balance, decreased standing balance, decreased postural control, hemiplegia and decreased balance strategies and therefore will continue to benefit from skilled OT intervention to enhance overall performance with BADL and Reduce care partner burden.  Patient not progressing toward long term goals.  See goal revision. Goals downgraded to min A overall.   OT Short Term Goals Week 2:  OT Short Term Goal 1 (Week 2): Pt will complete toileting tasks with mod A OT Short Term Goal 2 (Week 2): Pt will appropriately incorporate her RUE into grooming tasks with set up assist OT Short Term Goal 2 - Progress (Week 2): Progressing toward goal OT Short Term Goal 3 (Week 2): Pt will complete toilet transfer with min A OT Short Term Goal 3 - Progress (Week 2): Not  met Week 3:  OT Short Term Goal 1 (Week 3): Pt will don LB clothing with min A OT Short Term Goal 2 (Week 3): Pt will complete toilet or BSC transfer with min A OT Short Term Goal 3 (Week 3): Pt will incorporate her RUE into bathing tasks with no more than min cueing  Skilled Therapeutic Interventions/Progress Updates:    Pt received supine, no pain, upset re last night's choking incident at dinner. Discussed diet, CVA effects on swallowing, and need to likely wear dentures. Passed on this info to SLP as well. Discussed POC and downgrading goals vs extending d/c. Pt completed squat pivot transfer to the w/c with min cueing with mod A. She completed oral care at the sink with min HOH to bimanually manage dentures and adhesive cream. Pt was taken to the therapy gym. In the parallel bars pt stood and faced large mirror for visual feedback. Pt completed functional reaching with the R and LUE, holding a marker and completing visual scanning for line bisection activity. Pt required min A overall for balance, mod cueing for midline orientation and positioning. Pt completed SciFi arm bike with R UE secured to the handle with ace wrap. Pt was able to activate shoulder flexion and extension and completed 8 min with several rest breaks. Pt returned to her room and was left supine with all needs met, bed alarm set.   Therapy Documentation Precautions:  Precautions Precautions: Fall Precaution Comments: R hemiparesis and incoordination, pushing to R in stand Restrictions Weight Bearing Restrictions: No   Therapy/Group: Individual Therapy  Curtis Sites 12/29/2019, 6:44 AM

## 2019-12-29 NOTE — Patient Care Conference (Signed)
Inpatient RehabilitationTeam Conference and Plan of Care Update Date: 12/29/2019   Time: 10:12 AM    Patient Name: Melissa Colon      Medical Record Number: [id number]  Date of Birth: 01-02-1961 Sex: Female         Room/Bed: 4W01C/4W01C-01 Payor Info: Payor: /    Admit Date/Time:  12/14/2019  1:33 PM  Primary Diagnosis:  ICH (intracerebral hemorrhage) [facility])  Hospital Problems: Principal Problem:   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 Active Problems:   Protein-calorie malnutrition, severe    Expected Discharge Date: Expected Discharge Date: 01/05/20  Team Members Present: Physician leading conference: Dr. Faith Rogue Care Coodinator Present: Cecile Sheerer, LCSWA; Marlyne Beards, RN, BSN, CRRN Nurse Present: Other (comment) Roseanne Reno, RN) PT Present: Serina Cowper, PT OT Present: Jake Shark, OT SLP Present: Feliberto Gottron, SLP PPS Coordinator present : Fae Pippin, [place], PT     Current Status/Progress Goal Weekly Team Focus  Bowel/Bladder   Pt continent of B/B LBM 12/28/2019  Pt to remain continent of B/B  Assess B/B evey shift and PRN   Swallow/Nutrition/ Hydration             ADL's   CGA UB dressing, min A UB bathing, mod A LB bathing and dressing, RUE still ataxic, no sensation. Mod A transfers  downgraded to min-mod A  RUE NMR, midline orientation, functional activity tolerance, ADL retraining/transfers   Mobility   Supervision bed mobility, min-mod A transfers, min A gait at rail and mod/max A with RW and R hands splint, supervision w/c mobility 125 ft  Min A overall, supervision w/c level  Functional mobility, balance, gait training, w/c mobility, activity tolerance, midline orientation, R UE/LE NMR, patient/caregiver education.   Communication   Supervision-Mod I  Mod I  word-finding strategies and use of speech intelligibility strategies   Safety/Cognition/ Behavioral Observations  Min A  Supervision   complex problem solving and recall   Pain   Pt consistently denies pain  Pain to remain 0/10  Assess every shift and PRN   Skin   Ecchymosis to abdomen  no new skin breakdown  Assess every shift and PRN     Discharge Planning:  Pt to be assessed. Per EMR, pt lives with s/o, son, and brother and sister in law-none of which work and able to provide 24/7 care.   Team Discussion: Complained about not having teeth, continent B/B, no complaints of pain. OT reports patient now has Fixodent for teeth and can chew food as long as it has been cut small enough. She just needs set-up. Not progressing otherwise as well. Downgrading goals to min-mod assist. PT reports patient's progress is slow and had a W/C eval today. Need for donation chair or charity is known. Downgrading goals as well. Need confirmation of 2+ assist for SW. SLP reports they have not heard of any issues with having difficulty with swallowing. Patient on target to meet rehab goals: no, therapy had to downgrade patient goals.  *See Care Plan and progress notes for long and short-term goals.   Revisions to Treatment Plan:  Downgraded therapy goals.  Teaching Needs: Continue family education  Current Barriers to Discharge: Decreased caregiver support, Home enviroment access/layout, Lack of/limited family support and Behavior  Possible Resolutions to Barriers: Continue current medications, provide emotional support to patient and family.     Medical Summary Current Status: improving motor control RUE and RLE. persistent mild headache. moving bowels  Barriers to Discharge: Medical stability  Possible Resolutions to [employer]: daily review of patient data and labs. pain mgt, bp control.   Continued Need for Acute Rehabilitation Level of Care: The patient requires daily medical management by a physician with specialized training in physical medicine and rehabilitation for the following reasons: Direction of a  multidisciplinary physical rehabilitation program to maximize functional independence : Yes Medical management of patient stability for increased activity during participation in an intensive rehabilitation regime.: Yes Analysis of laboratory values and/or radiology reports with any subsequent need for medication adjustment and/or medical intervention. : Yes   I attest that I was present, lead the team conference, and concur with the assessment and plan of the team.   Tennis Must 12/29/2019, 1:57 PM

## 2019-12-29 NOTE — Progress Notes (Signed)
Speech Language Pathology Daily Session Note  Patient Details  Name: Melissa Colon MRN: [id number] Date of Birth: August 02, 1960  Today's Date: 12/29/2019 SLP Individual Time: 1415-1445 SLP Individual Time Calculation (min): 30 min  Short Term Goals: Week 3: SLP Short Term Goal 1 (Week 3): STG=LTG due to short ELOS (expected 12/21)  Skilled Therapeutic Interventions: Skilled treatment session focused on cognitive goals. SLP facilitated session by providing Min verbal cues for emergent awareness regarding proper positioning for PO intake. SLP provided education regarding having a pillow behind her lower back to maximize upright positioning, she verbalized understanding. Also educated on the importance of requesting assistance for tray set-up and donning dentures prior to PO intake. She verbalized understanding. Patient left upright in bed with alarm on and all needs within reach. Continue with current plan of care.      Pain No/Denies Pain   Therapy/Group: Individual Therapy  ,  12/29/2019, 3:41 PM

## 2019-12-29 NOTE — Progress Notes (Signed)
[facility] PHYSICAL MEDICINE & REHABILITATION PROGRESS NOTE   Subjective/Complaints: Up early with therapy. Mild headaches still  ROS: Patient denies fever, rash, sore throat, blurred vision, nausea, vomiting, diarrhea, cough, shortness of breath or chest pain, joint or back pain,  or mood change.    .      Objective:   No results found. No results for input(s): WBC, HGB, HCT, PLT in the last 72 hours. No results for input(s): NA, K, CL, CO2, GLUCOSE, BUN, CREATININE, CALCIUM in the last 72 hours.  Intake/Output Summary (Last 24 hours) at 12/29/2019 0915 Last data filed at 12/29/2019 0700 Gross per 24 hour  Intake 560 ml  Output --  Net 560 ml    Physical Exam: Vital Signs Blood pressure 115/67, pulse 79, temperature 97.7 F (36.5 C), temperature source Oral, resp. rate 16, height 5\' 2"  (1.575 m), weight 46.7 kg, SpO2 99 %.  Constitutional: No distress . Vital signs reviewed. HEENT: EOMI, oral membranes moist Neck: supple Cardiovascular: RRR without murmur. No JVD    Respiratory/Chest: CTA Bilaterally without wheezes or rales. Normal effort    GI/Abdomen: BS +, non-tender, non-distended Ext: no clubbing, cyanosis, or edema Psych: pleasant and cooperative Neuro:  right central 7, mild dysarthria.  Dysconjugate gaze still present.  Provides name and age. more alert and aware. RUE 3- SA, EE, EF, 4-/5 hand grip and WE RLE 3+/5 HF, KE, 4/5 DF, PF. Initiating use of right side better Left side diminished LT,PP     Assessment/Plan: 1. Functional deficits which require 3+ hours per day of interdisciplinary therapy in a comprehensive inpatient rehab setting.  Physiatrist is providing close team supervision and 24 hour management of active medical problems listed below.  Physiatrist and rehab team continue to assess barriers to discharge/monitor patient progress toward functional and medical goals  Care Tool:  Bathing    Body parts bathed by patient: Right arm,Left  arm,Chest,Abdomen,Front perineal area,Right upper leg,Left upper leg,Face,Right lower leg,Left lower leg   Body parts bathed by helper: Buttocks     Bathing assist Assist Level: Minimal Assistance - Patient > 75%     Upper Body Dressing/Undressing Upper body dressing   What is the patient wearing?: Pull over shirt    Upper body assist Assist Level: Supervision/Verbal cueing    Lower Body Dressing/Undressing Lower body dressing      What is the patient wearing?: Incontinence brief     Lower body assist Assist for lower body dressing: Moderate Assistance - Patient 50 - 74%     Toileting Toileting    Toileting assist Assist for toileting: Maximal Assistance - Patient 25 - 49%     Transfers Chair/bed transfer  Transfers assist     Chair/bed transfer assist level: Moderate Assistance - Patient 50 - 74%     Locomotion Ambulation   Ambulation assist      Assist level: Maximal Assistance - Patient 25 - 49% Assistive device: Walker-rolling Max distance: 34ft   Walk 10 feet activity   Assist  Walk 10 feet activity did not occur: Safety/medical concerns (R hemi, R pusher, decreased motor planning, R inattention)  Assist level: Maximal Assistance - Patient 25 - 49% Assistive device: Walker-rolling   Walk 50 feet activity   Assist Walk 50 feet with 2 turns activity did not occur: Safety/medical concerns         Walk 150 feet activity   Assist Walk 150 feet activity did not occur: Safety/medical concerns  Walk 10 feet on uneven surface  activity   Assist Walk 10 feet on uneven surfaces activity did not occur: Safety/medical concerns         Wheelchair     Assist Will patient use wheelchair at discharge?: Yes Type of Wheelchair: Manual    Wheelchair assist level: Supervision/Verbal cueing Max wheelchair distance: 122 ft    Wheelchair 50 feet with 2 turns activity    Assist        Assist Level: Supervision/Verbal  cueing   Wheelchair 150 feet activity     Assist      Assist Level: Dependent - Patient 0%   Blood pressure 115/67, pulse 79, temperature 97.7 F (36.5 C), temperature source Oral, resp. rate 16, height 5\' 2"  (1.575 m), weight 46.7 kg, SpO2 99 %.  Medical Problem List and Plan: 1.  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  Antithrombotics: -DVT/anticoagulation: Subcutaneous heparin             -antiplatelet therapy: N/A 3. Pain Management: Tylenol as needed- for occasional left sided headache  -dental caries   -chlorhexidine rinse, orajel   - Will need outpt dentistry f/u at some point (doesn't have dentist)  12/13-14 continue tylenol for h/a 4. Mood: Provide emotional support             -antipsychotic agents: N/A  - prn trazodone to assist with sleep 5. Neuropsych: This patient is capable of making decisions on her own behalf. 6. Skin/Wound Care: Routine skin checks 7. Fluids/Electrolytes/Nutrition: encouraging PO 8.  Hypertension.  Norvasc 5 mg daily.    12/11 bp low, decreased Norvasc to 2.5mg .   12/14: excellent control, continue norvasc 2.5mg  9.  Tobacco abuse.  NicoDerm patch.  Provide counseling 10.  History of alcohol use.  Provide counseling. Continue folic acid supplement 11. Tachycardic to 103 on admission-      resolved  12/14: HR incontrolled currently  12. Urinary retention: pvr's generally less than 200, urine still cloudy  12/2-ucx with 100k aerococcus urinae.    -amoxil 12/2-12/4 completed 13. Acute blood loss anemia: Monitor Hgb weekly. 11.8 on 11/30 14. Multiple BM per day: stopped senna-docusate--formed stool 12/3  12/7  Disimpacted   12/8 -resumed senna-s, miralax  12/13-14, formed bm's  last two days     LOS: 15 days A FACE TO FACE EVALUATION WAS PERFORMED  11-27[birth year]1984 12/29/2019, 9:15 AM

## 2019-12-29 NOTE — Plan of Care (Signed)
  Problem: RH Balance Goal: LTG Patient will maintain dynamic sitting balance (PT) Description: LTG:  Patient will maintain dynamic sitting balance with assistance during mobility activities (PT) Flowsheets (Taken 12/29/2019 2154) LTG: Pt will maintain dynamic sitting balance during mobility activities with:: (Downgraded goal due to slow progress with mobility secondary to motor planning deficits.) Supervision/Verbal cueing Note: Downgraded goal due to slow progress with mobility secondary to motor planning deficits.    Problem: Sit to Stand Goal: LTG:  Patient will perform sit to stand with assistance level (PT) Description: LTG:  Patient will perform sit to stand with assistance level (PT) Flowsheets (Taken 12/29/2019 2154) LTG: PT will perform sit to stand in preparation for functional mobility with assistance level: (Downgraded goal due to slow progress with mobility secondary to motor planning deficits.) Minimal Assistance - Patient > 75% Note: Downgraded goal due to slow progress with mobility secondary to motor planning deficits.    Problem: RH Bed Mobility Goal: LTG Patient will perform bed mobility with assist (PT) Description: LTG: Patient will perform bed mobility with assistance, with/without cues (PT). Flowsheets (Taken 12/29/2019 2154) LTG: Pt will perform bed mobility with assistance level of: (Downgraded goal due to slow progress with mobility secondary to motor planning deficits.) Supervision/Verbal cueing Note: Downgraded goal due to slow progress with mobility secondary to motor planning deficits.    Problem: RH Bed to Chair Transfers Goal: LTG Patient will perform bed/chair transfers w/assist (PT) Description: LTG: Patient will perform bed to chair transfers with assistance (PT). Flowsheets (Taken 12/29/2019 2154) LTG: Pt will perform Bed to Chair Transfers with assistance level: (Downgraded goal due to slow progress with mobility secondary to motor planning deficits.)  Minimal Assistance - Patient > 75% Note: Downgraded goal due to slow progress with mobility secondary to motor planning deficits.    Problem: RH Ambulation Goal: LTG Patient will ambulate in controlled environment (PT) Description: LTG: Patient will ambulate in a controlled environment, # of feet with assistance (PT). Flowsheets Taken 12/29/2019 2154 LTG: Pt will ambulate in controlled environ  assist needed:: (Downgraded goal due to slow progress with mobility secondary to motor planning deficits.) Moderate Assistance - Patient 50 - 74% Taken 12/15/2019 1800 LTG: Ambulation distance in controlled environment: 50 ft using LRAD Note: Downgraded goal due to slow progress with mobility secondary to motor planning deficits.    Problem: RH Wheelchair Mobility Goal: LTG Patient will propel w/c in home environment (PT) Description: LTG: Patient will propel wheelchair in home environment, # of feet with assistance (PT). Flowsheets Taken 12/29/2019 2154 LTG: Pt will propel w/c in home environ  assist needed:: (Downgraded goal due to slow progress with mobility secondary to motor planning deficits.) Supervision/Verbal cueing Taken 12/15/2019 1800 LTG: Propel w/c distance in home environment: 50 ft Note: Downgraded goal due to slow progress with mobility secondary to motor planning deficits.    Problem: RH Awareness Goal: LTG: Patient will demonstrate awareness during functional activites type of (PT) Description: LTG: Patient will demonstrate awareness during functional activites type of (PT) Flowsheets (Taken 12/29/2019 2204) Patient will demonstrate awareness during functional activites type of: Anticipatory LTG: Patient will demonstrate awareness during functional activites type of (PT): Supervision

## 2019-12-29 NOTE — Progress Notes (Signed)
Nutrition Follow-up  DOCUMENTATION CODES:   Severe malnutrition in context of social or environmental circumstances  INTERVENTION:   - Continue MVI with minerals daily  -Continue Magic cupBID with meals, each supplement provides 290 kcal and 9 grams of protein  - Increase Ensure Enlive po to TID, each supplement provides 350 kcal and 20 grams of protein  NUTRITION DIAGNOSIS:   Severe Malnutrition related to social / environmental circumstances (EtOH abuse) as evidenced by severe muscle depletion,severe fat depletion.  Ongoing  GOAL:   Patient will meet greater than or equal to 90% of their needs  Progressing  MONITOR:   PO intake,Supplement acceptance,Labs,Weight trends  REASON FOR ASSESSMENT:   Malnutrition Screening Tool    ASSESSMENT:   59 year old female with PMH of EtOH and tobacco use, right cerebellar brain tumor with craniotomy as a teenager. Presented 12/10/19 with right-sided weakness and mild aphasia. Cranial CT scan showed acute intraparenchymal hemorrhage in the left thalamus. Admitted to CIR on 11/29.  Noted target d/c date of 12/21.  Spoke with pt at bedside. Pt eating from lunch tray at time of RD visit. Lunch tray consisted of banana pudding, chocolate ice cream, and chocolate Magic Cup.  Discussed importance of PO intake with pt. Pt states that foods are hard to eat if they are not cut up. Pt states that yesterday she choked on a piece of food. Pt states that she will drink the Ensure supplements whenever they are provided to her. RD will increase order from BID to TID.  Discussed food options with pt. Pt willing to try the chicken salad. RD added chicken salad to pt's dinner meal tray for today.  Reviewed weights. Pt with a 3.2 kg weight loss from 11/25 to 12/11. This is a 6.4% weight loss in less than 1 month which is severe and significant for timeframe. Pt already with severe malnutrition diagnosis so weight loss is even more  concerning.  Meal Completion: 30-100% (averaging 86%)  Medications reviewed and include: Ensure Enlive BID, MVI with minerals, protonix, miralax, senna  Labs reviewed.  Diet Order:   Diet Order            DIET SOFT Room service appropriate? Yes; Fluid consistency: Thin  Diet effective now                 EDUCATION NEEDS:   Education needs have been addressed  Skin:  Skin Assessment: Reviewed RN Assessment  Last BM:  12/29/19 medium type 5  Height:   Ht Readings from Last 1 Encounters:  12/14/19 5\' 2"  (1.575 m)    Weight:   Wt Readings from Last 1 Encounters:  12/26/19 46.7 kg    BMI:  Body mass index is 18.83 kg/m.  Estimated Nutritional Needs:   Kcal:  1550-1750  Protein:  70-85 grams  Fluid:  1.5-1.7 L    14/11/21, MS, RD, LDN Inpatient Clinical Dietitian Please see [facility] for contact information.

## 2019-12-29 NOTE — Plan of Care (Signed)
Goals downgraded 2/2 lack of pt progress Problem: RH Balance Goal: LTG Patient will maintain dynamic standing with ADLs (OT) Description: LTG:  Patient will maintain dynamic standing balance with assist during activities of daily living (OT)  Flowsheets (Taken 12/29/2019 1015) LTG: Pt will maintain dynamic standing balance during ADLs with: (downgraded 12/14) Minimal Assistance - Patient > 75%   Problem: Sit to Stand Goal: LTG:  Patient will perform sit to stand in prep for activites of daily living with assistance level (OT) Description: LTG:  Patient will perform sit to stand in prep for activites of daily living with assistance level (OT) Flowsheets (Taken 12/29/2019 1015) LTG: PT will perform sit to stand in prep for activites of daily living with assistance level: (downgraded 12/14) Minimal Assistance - Patient > 75%   Problem: RH Bathing Goal: LTG Patient will bathe all body parts with assist levels (OT) Description: LTG: Patient will bathe all body parts with assist levels (OT) Flowsheets (Taken 12/29/2019 1015) LTG: Pt will perform bathing with assistance level/cueing: (downgraded 12/14) Minimal Assistance - Patient > 75%   Problem: RH Dressing Goal: LTG Patient will perform lower body dressing w/assist (OT) Description: LTG: Patient will perform lower body dressing with assist, with/without cues in positioning using equipment (OT) Flowsheets (Taken 12/29/2019 1015) LTG: Pt will perform lower body dressing with assistance level of: (downgraded 12/14) Minimal Assistance - Patient > 75%   Problem: RH Toileting Goal: LTG Patient will perform toileting task (3/3 steps) with assistance level (OT) Description: LTG: Patient will perform toileting task (3/3 steps) with assistance level (OT)  Flowsheets (Taken 12/29/2019 1015) LTG: Pt will perform toileting task (3/3 steps) with assistance level: (downgraded 12/14) Moderate Assistance - Patient 50 - 74%   Problem: RH Toilet  Transfers Goal: LTG Patient will perform toilet transfers w/assist (OT) Description: LTG: Patient will perform toilet transfers with assist, with/without cues using equipment (OT) Flowsheets (Taken 12/29/2019 1015) LTG: Pt will perform toilet transfers with assistance level of: (downgraded 12/14) Moderate Assistance - Patient 50 - 74%   Problem: RH Tub/Shower Transfers Goal: LTG Patient will perform tub/shower transfers w/assist (OT) Description: LTG: Patient will perform tub/shower transfers with assist, with/without cues using equipment (OT) Outcome: Not Applicable Flowsheets (Taken 12/29/2019 1015) LTG: Pt will perform tub/shower stall transfers with assistance level of: (goal d/c, pt prefers to not shower) --

## 2019-12-29 NOTE — Plan of Care (Signed)
  Problem: Consults Goal: RH STROKE PATIENT EDUCATION Description: See Patient Education module for education specifics  Outcome: Progressing   Problem: RH SKIN INTEGRITY Goal: RH STG MAINTAIN SKIN INTEGRITY WITH ASSISTANCE Description: STG Maintain Skin Integrity With min Assistance. Outcome: Progressing   Problem: RH SAFETY Goal: RH STG ADHERE TO SAFETY PRECAUTIONS W/ASSISTANCE/DEVICE Description: STG Adhere to Safety Precautions With min Assistance/Device. Outcome: Progressing   Problem: RH PAIN MANAGEMENT Goal: RH STG PAIN MANAGED AT OR BELOW PT'S PAIN GOAL Description: <3 on a 0-10 pain scale. Outcome: Progressing   Problem: RH KNOWLEDGE DEFICIT Goal: RH STG INCREASE KNOWLEDGE OF HYPERTENSION Description: Patient will demonstrate knowledge of HTN medications, dietary restrictions, with min assist, and the aid of education materials and handouts. Outcome: Progressing Goal: RH STG INCREASE KNOWLEGDE OF HYPERLIPIDEMIA Description: Patient will demonstrate knowledge of HLD medications, dietary restrictions, with min assist, and the aid of education materials and handouts.  Outcome: Progressing Goal: RH STG INCREASE KNOWLEDGE OF STROKE PROPHYLAXIS Description: Patient will demonstrate knowledge of stroke preventative medications, dietary restrictions, with min assist, and the aid of education materials and handouts.  Outcome: Progressing

## 2019-12-29 NOTE — Progress Notes (Signed)
Physical Therapy Weekly Progress Note  Patient Details  Name: Melissa Colon MRN: [id number] Date of Birth: 15-Sep-1960  Beginning of progress report period: December 21, 2019 End of progress report period: December 29, 2019  Today's Date: 12/29/2019 PT Individual Time: 1030-1130 and 1330-1400 PT Individual Time Calculation (min): 30 min   Patient has met 1 of 3 short term goals.  Patient with slow progress with functional mobility this week. Continues to be limited by decreased motor planning, R lower extremity extensor tone with R side hemiplegia, and poor midline orientation with mobility. She currently performs bed mobility with supervision using bed rail with intermittent min A with fatigue, mod-min A for squat pivot and sit to stand transfers with RW and R hand splint, she is ambulating 28 ft with min A using L hand rail and 12 ft with mod A using RW with R hand splint, has made good progress with w/c mobility propelling >150 feet with supervision demonstrating improved safety awareness and working towards set-up for transfers with management of w/c parts.   Patient continues to demonstrate the following deficits muscle weakness, decreased cardiorespiratoy endurance, impaired timing and sequencing, decreased coordination and decreased motor planning, decreased visual acuity and decreased visual motor skills, decreased midline orientation, decreased attention to right and decreased motor planning, decreased initiation, decreased problem solving and decreased memory and decreased sitting balance, decreased standing balance, decreased postural control, hemiplegia and decreased balance strategies and therefore will continue to benefit from skilled PT intervention to increase functional independence with mobility.  Patient not progressing toward long term goals.  See goal revision.  Plan of care revisions: Downgraded goals to supervision w/c mobility, min A transfers, and mod A therapeutic gait  with LRAD, d/c stair goal and household ambulation goal due to lack of progress, see care plan for details.  PT Short Term Goals Week 2:  PT Short Term Goal 1 (Week 2): Patient will perform basic transfers with min A consistently using LRAD. PT Short Term Goal 1 - Progress (Week 2): Progressing toward goal PT Short Term Goal 2 (Week 2): Patient will ambulate >50 ft with +1-2 assist using LRAD. PT Short Term Goal 2 - Progress (Week 2): Progressing toward goal PT Short Term Goal 3 (Week 2): Patient will propel w/c >50 ft in household set-up with distant supervision. PT Short Term Goal 3 - Progress (Week 2): Met Week 3:  PT Short Term Goal 1 (Week 3): STG=LTG due to ELOS.  Skilled Therapeutic Interventions/Progress Updates:     Session 1: Patient in bed asleep upon PT arrival. Patient easily aroused to tactile stimuli and agreeable to PT session. Patient denied pain during session. Patient reported poor sleep last night due to choking on her dinner last night leading to concern over eating throughout the night. Informed patient about team conference discussion, including instructions for staff to apply patient's dentures prior to meals and perform set-up assist with foot cut into small pieces to reduce choking/aspiration. Patient in agreement with plan and willing to try this during her meals today.   Therapeutic Activity: Bed Mobility: Patient performed supine to/from sit with supervision and increased time for motor planning with use of bed rail. Provided verbal cues for hand placement, sequencing, and use of L elbow to push up with her R hand on the rail to come to sitting. Donned B tennis shoes with max A for energy/time management with patient sitting EOB with supervision for sitting balance. Patient achieves midline consistently in sitting.  Transfers: Patient  performed squat pivot bed<>w/c with min-mod A. Provided cues for hand placement, head-hips relationship, and blocking R foot to prevent  sliding. She performed sit to/from stand x4 with min-mod A using RW with R hand splint. Patient able to place R hand in splint and apply strap with min cues. Provided verbal cues for scooting forward, hand placement, forward and L weight shift to stand, increased R knee/hip extension in standing, and reaching back to sit for safety.  Gait Training:  Patient ambulated 10-12 feet x3 using RW with R hand splint with mod A. Ambulated with decreased L weight shift, abducted stance on L, increased time for initiation of R step and facilitation for L weight shift to lift R foot, mild-moderate R lean, able to self-correct intermittently with visual target, and facilitation provided for AD management due to decreased control with R upper extremity weakness. Provided verbal cues for L weight and trunk shift, sequencing, safe AD management, and increased R hamstring activation in pre-swing.  Wheelchair Mobility:  Patient was transported in the w/c with total A throughout session for energy conservation and time management.  Neuromuscular Re-ed: Patient performed the following standing balance activities: -standing balance with midline orientation with visual target on the L to reduce R lean, progressed from mod A to supervision 2x2-3 min -sit to/from stand with RW and R hand splint x4 for improved sequencing and improved initiation with midline orientation to reduce assist with transfers, progressed from mod-min A with improved technque  Patient in bed, as patient declined sitting OOB at this time despite encouragement from PT, at end of session with breaks locked, bed alarm set, and all needs within reach.   Session 2: Patient in bed upon PT arrival. Patient alert and agreeable to PT session. Patient denied pain during session. Corene Cornea, ATP, from [facility] present for >1/2 the session to perform custom w/c evaluation. Per w/c evaluation, patient will benefit from 16"x16" ultralight folding frame with 3"  foam cushion. Ultralight frame will reduce patient effort with propulsion over carpet and longer community distances due to use of L hemi-technique and improve activity tolerance with locomotion and decreased risk of overuse injury to the patient with long term use. Plan for w/c propulsion as main mobility for increased independence and decreased fall risk at d/c.   Therapeutic Activity: Bed Mobility: Patient performed supine to/from sit as above. Discussed hospital bed versus regular bed at d/c. Patient reports that her bed at home is very high and will not be able to be modified for improved functional transfers. Patient stated that she is unsure if a hospital bed with fit in the home. Plan to discuss with family during family education prior to d/c.  Transfers: Patient performed squat pivot transfers bed<>w/c as above.  Wheelchair Mobility:  Patient propelled wheelchair >150 feet x2 with supervision. Provided verbal cues for avoiding objects on the R x2, and turning technique x1.  Patient in bed, per patient's request, as she again refused sitting OOB at this time, at end of session with breaks locked, bed alarm set, and all needs within reach.    Therapy Documentation Precautions:  Precautions Precautions: Fall Precaution Comments: R hemiparesis and incoordination, pushing to R in stand Restrictions Weight Bearing Restrictions: No  Therapy/Group: Individual Therapy   L  PT, DPT  12/29/2019, 9:13 PM

## 2019-12-29 NOTE — Progress Notes (Signed)
Patient ID: Melissa Colon, female   DOB: [birth year], 59 y.o.   MRN: [id number]  SW called [name] [phone]) to provide updates from team conference, and no changes with discharge date. SW discussed support pt will need at d/c. SW discussed family education. Plans to be here for family edu on Sunday (12/19) 10am-12pm and 1pm-3pm; and Monday (12/20) 8am-12pm;1pm-3pm. States he will call SW to confirm if another family member will be present after speaking with pt husband/his brother in law.   Cecile Sheerer, MSW, LCSWA Office: [phone] Cell: [phone] Fax: [phone]

## 2019-12-30 ENCOUNTER — Inpatient Hospital Stay (HOSPITAL_COMMUNITY): Payer: Self-pay

## 2019-12-30 ENCOUNTER — Inpatient Hospital Stay (HOSPITAL_COMMUNITY): Payer: Self-pay | Admitting: Speech Pathology

## 2019-12-30 MED ORDER — TOPIRAMATE 25 MG PO TABS
25.0000 mg | ORAL_TABLET | Freq: Every day | ORAL | Status: DC
  Administered 2019-12-30 – 2020-01-04 (×6): 25 mg via ORAL
  Filled 2019-12-30 (×6): qty 1

## 2019-12-30 NOTE — Progress Notes (Signed)
Physical Therapy Session Note  Patient Details  Name: Melissa Colon MRN: [id number] Date of Birth: Jun 03, 1960  Today's Date: 12/30/2019 PT Individual Time: 1345-1500 PT Individual Time Calculation (min): 75 min   Short Term Goals: Week 3:  PT Short Term Goal 1 (Week 3): STG=LTG due to ELOS.  Skilled Therapeutic Interventions/Progress Updates:     Patient in bed upon PT arrival. Patient alert and agreeable to PT session. Patient denied pain during session.  Therapeutic Activity: Bed Mobility: Patient performed supine to/from sit with supervision in a flat bed without use of bed rails with increased time for motor planning. Provided verbal cues for pushing up with her L arm to come to sitting and bringing her L knee to chest to lift her leg onto the bed. Transfers: Patient performed a slide board transfer bed>w/c with CGA to the L and total A for board placement. Provided cues for hand placement, board placement, and head-hips relationship for proper technique and decreased assist with transfers.  Patient performed a simulated sedan height car transfer with min A performing squat pivot. Provided cues for safe technique, hand placement, and foot placement. Patient able to bring B lower extremities into the vehicle with self-assist of R hand to lift her L leg in. She performed stand pivot using a grab bar w/c<>BSC over the toilet with min A and total A for lower body clothing management. Provided cues for sequencing, L weight shift, and management of L arm for safety. She was continent of bladder during toileting, performed peri-care with set-up assist.  She performed a lateral scoot transfer w/c>bed with CGA-min A with cues for hand placement, foot placement, and head-hips relationship.  Wheelchair Mobility:  Patient propelled wheelchair ~150 feet with supervision using L hemi technique. Provided verbal cues for avoiding objects on the R x2 and turning technique x1. Provided demonstration  and cues for use of breaks throughout session, donned/doffed leg rests with min A and cues for technique, and performed set-up for transfers with mod A and education on safety.   Neuromuscular Re-ed: Patient performed the following activities for improved motor planning with functional mobility: -blocked practice slide board transfers R/L x3 with CGA and total A for board placement to close supervision and min A for board placement with cues for placing and removing the board with her R hand on both sides, foot placement, hand placement (tends to attempt to use L hand unsafely due to poor awareness), and head hips relationship -blocked practice lateral scoot transfers R/L x3 with CGA progressing to intermittent close supervision to the L with cues for foot placement, hand placement (continues to attempt to use L hand unsafely due to poor awareness and recall), and head hips relationship  Patient in bed at end of session with breaks locked, bed alarm set, and all needs within reach.    Therapy Documentation Precautions:  Precautions Precautions: Fall Precaution Comments: R hemiparesis and incoordination, pushing to R in stand Restrictions Weight Bearing Restrictions: No   Therapy/Group: Individual Therapy   L  PT, DPT  12/30/2019, 4:39 PM

## 2019-12-30 NOTE — Progress Notes (Signed)
Speech Language Pathology Daily Session Note  Patient Details  Name: Melissa Colon MRN: [id number] Date of Birth: 01/19/1960  Today's Date: 12/30/2019 SLP Individual Time: 1005-1100 SLP Individual Time Calculation (min): 55 min  Short Term Goals: Week 3: SLP Short Term Goal 1 (Week 3): STG=LTG due to short ELOS (expected 12/21)  Skilled Therapeutic Interventions: Skilled treatment session focused on cognitive goals. Upon arrival, patient did not have her dentures in and reported she did not have them in while eating breakfast. Patient reported she did not use them due to not having assistance putting them in. SLP educated patient on importance of calling for assistance and currently has a set-up sign for meals outside her door. A sign was also hung above her bed to reinforce need for denture with meals. SLP also facilitated session by providing extra time for patient to complete a visual scanning task. Patient missed 15 out of 102 occurences of a single stimuli, most of which were in her right lower quadrant. SLP provided visual scanning strategies in which she used with Min verbal cues but will need reinforcement. Patient left upright in bed with alarm on and all needs within reach. Continue with current plan of care.      Pain No/Denies Pain   Therapy/Group: Individual Therapy  ,  12/30/2019, 12:54 PM

## 2019-12-30 NOTE — Progress Notes (Signed)
Occupational Therapy Session Note  Patient Details  Name: Melissa Colon MRN: [id number] Date of Birth: [birth year]  Today's Date: 12/30/2019 OT Individual Time: 9794-9971 OT Individual Time Calculation (min): 69 min    Short Term Goals: Week 3:  OT Short Term Goal 1 (Week 3): Pt will don LB clothing with min A OT Short Term Goal 2 (Week 3): Pt will complete toilet or BSC transfer with min A OT Short Term Goal 3 (Week 3): Pt will incorporate her RUE into bathing tasks with no more than min cueing  Skilled Therapeutic Interventions/Progress Updates:    Pt received supine with no c/o pain. Pt declining ADLs, despite significant encouragement from OT d/t not bathing for several days. Pt completed bed mobility with CGA. She completed stand pivot transfer toward the L side with mod A, RLE extensor tone requiring mod facilitation during transfer. Pt required mod A to scoot back in chair. Pt completed medicated mouthwash at the sink with set up assist. Trialed slideboard transfer to the mat and pt was able to complete with min facilitation for maintaining knee flexion, but no assist for scoot. Pt completed standing level functional reaching task, with the RUE initially, requiring mod facilitation for hand prehension, shoulder flexion, and proximal stability overall. Pt was able to reach across midline with mod facilitation. Pt required several seated rest breaks. She then stood and used her LUE to complete functional reaching, to focus on standing balance and weightbearing through the RUE/LE. Pt required mod cueing and min A overall for R lean and maintaining midline orientation. Pt completed several more trials of graded up/down standing balance activities, with intended carryover to toileting tasks and LB dressing with reaching around posteriorly to strength R/L shoulder IR. Pt completed slideboard transfer back to the w/c with CGA. Pt returned to her room and was left supine with all needs met. Bed  alarm set.   Therapy Documentation Precautions:  Precautions Precautions: Fall Precaution Comments: R hemiparesis and incoordination, pushing to R in stand Restrictions Weight Bearing Restrictions: No   Therapy/Group: Individual Therapy  Curtis Sites 12/30/2019, 6:53 AM

## 2019-12-30 NOTE — Progress Notes (Signed)
Patient ID: Melissa Colon, female   DOB: 1960/02/05, 59 y.o.   MRN: [id number]  SW received updates from pt brother Melissa Colon who reported that he and his niece Alcario Drought will be present for family education.   Cecile Sheerer, MSW, LCSWA Office: [phone] Cell: [phone] Fax: [phone]

## 2019-12-30 NOTE — Progress Notes (Signed)
[facility] PHYSICAL MEDICINE & REHABILITATION PROGRESS NOTE   Subjective/Complaints: Woke up with mild headache--tylenol helped. Still having them nightly and into the morning. Eating well. Frustrated with right arm and perceived lack of progress  ROS: Patient denies fever, rash, sore throat, blurred vision, nausea, vomiting, diarrhea, cough, shortness of breath or chest pain, joint or back pain,  or mood change.   .      Objective:   No results found. No results for input(s): WBC, HGB, HCT, PLT in the last 72 hours. No results for input(s): NA, K, CL, CO2, GLUCOSE, BUN, CREATININE, CALCIUM in the last 72 hours.  Intake/Output Summary (Last 24 hours) at 12/30/2019 0805 Last data filed at 12/30/2019 0700 Gross per 24 hour  Intake 800 ml  Output --  Net 800 ml    Physical Exam: Vital Signs Blood pressure 108/61, pulse 79, temperature 98.3 F (36.8 C), temperature source Oral, resp. rate 16, height 5\' 2"  (1.575 m), weight 46.7 kg, SpO2 97 %.  Constitutional: No distress . Vital signs reviewed. HEENT: EOMI, oral membranes moist Neck: supple Cardiovascular: RRR without murmur. No JVD    Respiratory/Chest: CTA Bilaterally without wheezes or rales. Normal effort    GI/Abdomen: BS +, non-tender, non-distended Ext: no clubbing, cyanosis, or edema Psych: pleasant and cooperative Neuro:  right central 7, mild dysarthria.  Dysconjugate gaze still present.  Provides name and age. more alert and aware. RUE 3 to 3+ SA, EE, EF, 4-/5 hand grip and WE RLE 3+/5 HF, KE, 4/5 DF, PF. Moving side more automatically Right side diminished LT,PP     Assessment/Plan: 1. Functional deficits which require 3+ hours per day of interdisciplinary therapy in a comprehensive inpatient rehab setting.  Physiatrist is providing close team supervision and 24 hour management of active medical problems listed below.  Physiatrist and rehab team continue to assess barriers to discharge/monitor patient progress  toward functional and medical goals  Care Tool:  Bathing    Body parts bathed by patient: Right arm,Left arm,Chest,Abdomen,Front perineal area,Right upper leg,Left upper leg,Face,Right lower leg,Left lower leg   Body parts bathed by helper: Buttocks     Bathing assist Assist Level: Minimal Assistance - Patient > 75%     Upper Body Dressing/Undressing Upper body dressing   What is the patient wearing?: Pull over shirt    Upper body assist Assist Level: Supervision/Verbal cueing    Lower Body Dressing/Undressing Lower body dressing      What is the patient wearing?: Incontinence brief     Lower body assist Assist for lower body dressing: Moderate Assistance - Patient 50 - 74%     Toileting Toileting    Toileting assist Assist for toileting: Maximal Assistance - Patient 25 - 49%     Transfers Chair/bed transfer  Transfers assist     Chair/bed transfer assist level: Moderate Assistance - Patient 50 - 74%     Locomotion Ambulation   Ambulation assist      Assist level: Moderate Assistance - Patient 50 - 74% Assistive device: Walker-rolling Max distance: 12 ft   Walk 10 feet activity   Assist  Walk 10 feet activity did not occur: Safety/medical concerns (R hemi, R pusher, decreased motor planning, R inattention)  Assist level: Moderate Assistance - Patient - 50 - 74% Assistive device: Walker-rolling   Walk 50 feet activity   Assist Walk 50 feet with 2 turns activity did not occur: Safety/medical concerns         Walk 150 feet activity  Assist Walk 150 feet activity did not occur: Safety/medical concerns         Walk 10 feet on uneven surface  activity   Assist Walk 10 feet on uneven surfaces activity did not occur: Safety/medical concerns         Wheelchair     Assist Will patient use wheelchair at discharge?: Yes Type of Wheelchair: Manual    Wheelchair assist level: Supervision/Verbal cueing Max wheelchair distance:  >150 ft    Wheelchair 50 feet with 2 turns activity    Assist        Assist Level: Supervision/Verbal cueing   Wheelchair 150 feet activity     Assist      Assist Level: Supervision/Verbal cueing   Blood pressure 108/61, pulse 79, temperature 98.3 F (36.8 C), temperature source Oral, resp. rate 16, height 5\' 2"  (1.575 m), weight 46.7 kg, SpO2 97 %.  Medical Problem List and Plan: 1.  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 Pt needs positive reinforcement re: progress. She is making steady gains.    2.  Antithrombotics: -DVT/anticoagulation: Subcutaneous heparin             -antiplatelet therapy: N/A 3. Pain Management: Tylenol as needed- for occasional left sided headache  -dental caries   -chlorhexidine rinse, orajel   - Will need outpt dentistry f/u at some point (doesn't have dentist)  12/15 talked with patient at length re: headache. We decided to try low dose topamax at HS to see if it reduces frequency and intensity of headache 4. Mood: Provide emotional support             -antipsychotic agents: N/A  - prn trazodone to assist with sleep 5. Neuropsych: This patient is capable of making decisions on her own behalf. 6. Skin/Wound Care: Routine skin checks 7. Fluids/Electrolytes/Nutrition: encouraging PO 8.  Hypertension.  Norvasc 5 mg daily.    12/11 bp low, decreased Norvasc to 2.5mg .   12/15: excellent control, continue norvasc 2.5mg  9.  Tobacco abuse.  NicoDerm patch.  Provide counseling 10.  History of alcohol use.  Provide counseling. Continue folic acid supplement 11. Tachycardic to 103 on admission-      resolved  12/15: HR incontrolled currently  12. Urinary retention: pvr's generally less than 200, urine still  cloudy  12/2-ucx with 100k aerococcus urinae.    -amoxil 12/2-12/4 completed 13. Acute blood loss anemia: Monitor Hgb weekly. 11.8 on 11/30 14. Multiple BM per day: stopped senna-docusate--formed stool 12/3  12/7  Disimpacted   12/8 -resumed senna-s, miralax  Moving bowels regularly now     LOS: 16 days A FACE TO FACE EVALUATION WAS PERFORMED  14/8 12/30/2019, 8:05 AM

## 2019-12-31 ENCOUNTER — Inpatient Hospital Stay (HOSPITAL_COMMUNITY): Payer: Self-pay

## 2019-12-31 ENCOUNTER — Inpatient Hospital Stay (HOSPITAL_COMMUNITY): Payer: Self-pay | Admitting: Speech Pathology

## 2019-12-31 NOTE — Progress Notes (Signed)
Physical Therapy Session Note  Patient Details  Name: Melissa Colon MRN: [id number] Date of Birth: [birth year]  Today's Date: 12/31/2019 PT Individual Time: 3825-0539 PT Individual Time Calculation (min): 58 min   Short Term Goals: Week 3:  PT Short Term Goal 1 (Week 3): STG=LTG due to ELOS.  Skilled Therapeutic Interventions/Progress Updates:     Patient in bed upon PT arrival. Patient alert and agreeable to PT session. Patient denied pain during session. Reports that she slept better last night.  Therapeutic Activity: Bed Mobility: Patient performed supine to sit with supervision in a flat hospital bed without use of bed rail, required min A in ADL bed due to softer surface and decreased motor planning in new environment. Provided verbal cues for sequencing and management of R arm and leg with mobility. Transfers: Patient performed level lateral scoot bed>w/c with min A-CGA blocking R foot and min A for unlevel transfer w/c<>ADL bed for boosting hips and blocking R foot. Provided verbal cues for hand placement, foot placement, and head-hips relationship. She performed sit to/from stand x1 with min A using RW with R hand splint. Provided verbal cues for hand placement on RW, foot placement, scooting forward, forward and L weight shift, and increased hip/knee trunk extension in standing. She performed a toilet transfer w/c<>BSC over the toilet with min-mod A performing stand pivot using arm rest to the [facility] and RW from the [facility]. Provided cues for hand placement, sequencing, and L weight shift in standing for lower body clothing management. Patient was continent of bladder during toileting, performed peri-care independently and lower body clothing management with total A due to decreased balance in standing with R lean.   Gait Training:  Patient ambulated 30 feet using RW with R hand splint with mod progressing to max A at the end due to fatigue. Ambulated with R lean with forward flexed  posture, decreased L weight shift resulting in intermittent poor initiation of R step, decreased R hamstring activation in pre-swing, and decreased R foot clearance. Demonstrates poor R lower extremity proprioception and motor planning with variable initiation and foot placement. Required mod A for R limb advancement x2 and facilitation and visual target with cues for L weight shift. Provided heavy verbal cues for sequencing throughout.  Wheelchair Mobility:  Patient propelled wheelchair 25 feet with supervision in ADL apartment to simulate home set-up. Provided verbal cues for avoiding objects on the R due to decreased R attention, turning technique in tight spaces, and w/c set-up for w/c<>bed transfers. Patient able to manage w/c parts with max cues during session.   Patient required increased time and rest breaks throughout session due to decreased activity tolerance. Patient very discouraged by her progress throughout session. Provided education on stroke recovery and progress since admission as well as significant positive reinforcement throughout session.   Patient in w/c in the room at end of session with breaks locked, seat belt alarm set, and all needs within reach.    Therapy Documentation Precautions:  Precautions Precautions: Fall Precaution Comments: R hemiparesis and incoordination, pushing to R in stand Restrictions Weight Bearing Restrictions: No General:   Vital Signs:   Pain: Pain Assessment Pain Scale: 0-10 Pain Score: 0-No pain Mobility:   Locomotion :    Trunk/Postural Assessment :    Balance:   Exercises:   Other Treatments:      Therapy/Group: Individual Therapy   L  PT, DPT  12/31/2019, 9:01 AM

## 2019-12-31 NOTE — Progress Notes (Signed)
Occupational Therapy Session Note  Patient Details  Name: Melissa Colon MRN: [id number] Date of Birth: March 16, 1960  Today's Date: 12/31/2019 OT Individual Time: 1035-1100 OT Individual Time Calculation (min): 25 min    Short Term Goals: Week 1:  OT Short Term Goal 1 (Week 1): Pt will don shirt with CGA OT Short Term Goal 1 - Progress (Week 1): Met OT Short Term Goal 2 (Week 1): Pt will require no more than min cueing for positioning of her RUE OT Short Term Goal 2 - Progress (Week 1): Met OT Short Term Goal 3 (Week 1): Pt will complete toilet transfer with mod A OT Short Term Goal 3 - Progress (Week 1): Met  Skilled Therapeutic Interventions/Progress Updates:    1;1. Pt received in w/c with increased processing time required today and more difficulty with word finding. RN alerted. Motor similar to last time this clinician saw pt. Focus of session on RUE NMR. MIN A squat pivot transfer to/from mat going to L and MOD A to R with R knee block. Pt completes ball exercises squeezing BUE into ball for pec activation shoulder flex/ext (to 40*), chest press, elbow flex/ext and side taps for visual attention as compensation to decreased sensation in RUE and mod VC for fixing hand on ball. Exited session with pt seated in w/c, exit alarm on and call light in reach   Therapy Documentation Precautions:  Precautions Precautions: Fall Precaution Comments: R hemiparesis and incoordination, pushing to R in stand Restrictions Weight Bearing Restrictions: No General:   Vital Signs: Therapy Vitals Temp: 97.9 F (36.6 C) Pulse Rate: 86 Resp: 16 BP: 126/70 Patient Position (if appropriate): Lying Oxygen Therapy SpO2: 100 % O2 Device: Room Air Pain: Pain Assessment Pain Scale: 0-10 Pain Score: 0-No pain ADL: ADL Eating: Minimal assistance,Minimal cueing Where Assessed-Eating: Bed level Grooming: Setup Where Assessed-Grooming: Sitting at sink Upper Body Bathing: Minimal  assistance,Minimal cueing Where Assessed-Upper Body Bathing: Edge of bed Lower Body Bathing: Moderate assistance Where Assessed-Lower Body Bathing: Edge of bed Upper Body Dressing: Minimal assistance,Minimal cueing Where Assessed-Upper Body Dressing: Edge of bed Lower Body Dressing: Moderate assistance,Minimal cueing Where Assessed-Lower Body Dressing: Edge of bed Toileting: Maximal assistance,Minimal cueing Where Assessed-Toileting: Bedside Commode Toilet Transfer: Maximal assistance Toilet Transfer Method: Stand pivot Toilet Transfer Equipment: Drop arm bedside commode Vision   Perception    Praxis   Exercises:   Other Treatments:     Therapy/Group: Individual Therapy  [name] 12/31/2019, 6:53 AM

## 2019-12-31 NOTE — Progress Notes (Signed)
[place] PHYSICAL MEDICINE & REHABILITATION PROGRESS NOTE   Subjective/Complaints: Up with therapy. Had a good night sleep. topiramate may have helped her headache.  ROS: Patient denies fever, rash, sore throat, blurred vision, nausea, vomiting, diarrhea, cough, shortness of breath or chest pain, joint or back pain, headache, or mood change.    .      Objective:   No results found. No results for input(s): WBC, HGB, HCT, PLT in the last 72 hours. No results for input(s): NA, K, CL, CO2, GLUCOSE, BUN, CREATININE, CALCIUM in the last 72 hours.  Intake/Output Summary (Last 24 hours) at 12/31/2019 0925 Last data filed at 12/31/2019 0720 Gross per 24 hour  Intake 837 ml  Output --  Net 837 ml    Physical Exam: Vital Signs Blood pressure 126/70, pulse 86, temperature 97.9 F (36.6 C), resp. rate 16, height 5\' 2"  (1.575 m), weight 46.7 kg, SpO2 100 %.  Constitutional: No distress . Vital signs reviewed. HEENT: EOMI, oral membranes moist Neck: supple Cardiovascular: RRR without murmur. No JVD    Respiratory/Chest: CTA Bilaterally without wheezes or rales. Normal effort    GI/Abdomen: BS +, non-tender, non-distended Ext: no clubbing, cyanosis, or edema Psych: pleasant and cooperative Neuro:  right central 7, mild dysarthria.  Dysconjugate gaze still present.  Provides name and age. more alert and aware. More aware of left side RUE 3 to 3+ SA, EE, EF, 4-/5 hand grip and WE RLE 3+/5 HF, KE, 4/5 DF, PF.   Right side withongoing deficits in LT,PP     Assessment/Plan: 1. Functional deficits which require 3+ hours per day of interdisciplinary therapy in a comprehensive inpatient rehab setting.  Physiatrist is providing close team supervision and 24 hour management of active medical problems listed below.  Physiatrist and rehab team continue to assess barriers to discharge/monitor patient progress toward functional and medical goals  Care Tool:  Bathing    Body parts bathed by  patient: Right arm,Left arm,Chest,Abdomen,Front perineal area,Right upper leg,Left upper leg,Face,Right lower leg,Left lower leg   Body parts bathed by helper: Buttocks     Bathing assist Assist Level: Minimal Assistance - Patient > 75%     Upper Body Dressing/Undressing Upper body dressing   What is the patient wearing?: Pull over shirt    Upper body assist Assist Level: Supervision/Verbal cueing    Lower Body Dressing/Undressing Lower body dressing      What is the patient wearing?: Incontinence brief     Lower body assist Assist for lower body dressing: Moderate Assistance - Patient 50 - 74%     Toileting Toileting    Toileting assist Assist for toileting: Maximal Assistance - Patient 25 - 49%     Transfers Chair/bed transfer  Transfers assist     Chair/bed transfer assist level: Minimal Assistance - Patient > 75%     Locomotion Ambulation   Ambulation assist      Assist level: Moderate Assistance - Patient 50 - 74% Assistive device: Walker-rolling (with R hand splint) Max distance: 30 ft   Walk 10 feet activity   Assist  Walk 10 feet activity did not occur: Safety/medical concerns (R hemi, R pusher, decreased motor planning, R inattention)  Assist level: Moderate Assistance - Patient - 50 - 74% Assistive device: Walker-rolling   Walk 50 feet activity   Assist Walk 50 feet with 2 turns activity did not occur: Safety/medical concerns         Walk 150 feet activity   Assist Walk 150  feet activity did not occur: Safety/medical concerns         Walk 10 feet on uneven surface  activity   Assist Walk 10 feet on uneven surfaces activity did not occur: Safety/medical concerns         Wheelchair     Assist Will patient use wheelchair at discharge?: Yes Type of Wheelchair: Manual    Wheelchair assist level: Supervision/Verbal cueing Max wheelchair distance: 25 ft    Wheelchair 50 feet with 2 turns activity    Assist         Assist Level: Supervision/Verbal cueing   Wheelchair 150 feet activity     Assist      Assist Level: Supervision/Verbal cueing   Blood pressure 126/70, pulse 86, temperature 97.9 F (36.6 C), resp. rate 16, height 5\' 2"  (1.575 m), weight 46.7 kg, SpO2 100 %.  Medical Problem List and Plan: 1.  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 Team continues to provide positive reinforcement re: progress. She is making steady gains.    2.  Antithrombotics: -DVT/anticoagulation: Subcutaneous heparin             -antiplatelet therapy: N/A 3. Pain Management: Tylenol as needed- for occasional left sided headache  -dental caries   -chlorhexidine rinse, orajel   - Will need outpt dentistry f/u at some point (doesn't have dentist)  12/16 continue low dose topiramate trial for headache 4. Mood: Provide emotional support             -antipsychotic agents: N/A  - prn trazodone to assist with sleep 5. Neuropsych: This patient is capable of making decisions on her own behalf. 6. Skin/Wound Care: Routine skin checks 7. Fluids/Electrolytes/Nutrition: encouraging PO 8.  Hypertension.  Norvasc 5 mg daily.    12/11 bp low, decreased Norvasc to 2.5mg .   12/16: excellent control, continue norvasc 2.5mg  9.  Tobacco abuse.  NicoDerm patch.  Provide counseling 10.  History of alcohol use.  Provide counseling. Continue folic acid supplement 11. Tachycardic to 103 on admission-      resolved  12/16: HR incontrolled currently  12. Urinary retention: pvr's generally less than 200, urine still cloudy  12/2-ucx with 100k aerococcus urinae.    -amoxil 12/2-12/4 completed 13. Acute blood loss anemia: Monitor Hgb weekly. 11.8 on 11/30 14. Multiple BM per day: stopped  senna-docusate--formed stool 12/3  12/8 -resumed senna-s, miralax  Moving bowels regularly now bm 12/16     LOS: 17 days A FACE TO FACE EVALUATION WAS PERFORMED  1/17 12/31/2019, 9:25 AM

## 2019-12-31 NOTE — Progress Notes (Signed)
Speech Language Pathology Daily Session Note  Patient Details  Name: Melissa Colon MRN: [id number] Date of Birth: Jul 30, 1960  Today's Date: 12/31/2019 SLP Individual Time: 1100-1150 SLP Individual Time Calculation (min): 50 min  Short Term Goals: Week 3: SLP Short Term Goal 1 (Week 3): STG=LTG due to short ELOS (expected 12/21)  Skilled Therapeutic Interventions:   Patient seen for skilled ST session focusing on cognitive-linguistic goals. Of note, OT who had just finished working with patient prior to this ST session informed SLP that patient appeared fatigued and slower processing and suspected could be from also having PT recently. Patient was in wheelchair and although appeared tired/fatigued, she was agreeable to therapy. She required more frequent cues to assist with word finding errors in basic level conversation and during task of patient describing tasks completed in therapies as well as her perceived progress overall. Patient was not able to describe an activity from PT session, but when SLP took  Her to therapy room where equipment was, she then was able to recall and describe working on bed transfer. Patient is "ready to go home" and with discharge date next week. She was not able to describe level of assistance she might require at home. When asked what she felt she needed to work on, she was able to state main concern is her right hand and arm. Patient with some apathy and not as interactive overall today. Patient continues to benefit from skilled SLP intervention to maximize cognitive-linguistic function prior to discharge.  Pain Pain Assessment Pain Scale: 0-10 Pain Score: 0-No pain  Therapy/Group: Individual Therapy   Angela Nevin, MA, CCC-SLP Speech Therapy

## 2019-12-31 NOTE — Progress Notes (Signed)
Occupational Therapy Session Note  Patient Details  Name: Melissa Colon MRN: [id number] Date of Birth: 14-Apr-1960  Today's Date: 12/31/2019 OT Individual Time: 1031-5945 OT Individual Time Calculation (min): 58 min    Short Term Goals: Week 1:  OT Short Term Goal 1 (Week 1): Pt will don shirt with CGA OT Short Term Goal 1 - Progress (Week 1): Met OT Short Term Goal 2 (Week 1): Pt will require no more than min cueing for positioning of her RUE OT Short Term Goal 2 - Progress (Week 1): Met OT Short Term Goal 3 (Week 1): Pt will complete toilet transfer with mod A OT Short Term Goal 3 - Progress (Week 1): Met  Skilled Therapeutic Interventions/Progress Updates:    1:1. Pt received in bed agreeable to OT with son present. Pt still in clothing this clinician helped put on over weekend. Pt agreeable with encouragement to change. Pt completes MIN-MOD A squat pivot transfers with pt in B directions into/out of bed. Pt requires max VC for attention to R limb positioning. Pt completes dressing at sink with set up to doff/don pull over shirt. Pants donned with A for standing balance (MIN) and MIN A to advance pants past hips statically at sink. Will attempt with RW to clarify for clothing management at home. Pt edu re available DME for home DC standard BSC. Son believes w/c can fit throug doorway of bathroom. OT requests son measure. Retrieved standard BSC to practice tomorrow. Exited session with pt seated in bed, exit alarm on and call light in reach   Therapy Documentation Precautions:  Precautions Precautions: Fall Precaution Comments: R hemiparesis and incoordination, pushing to R in stand Restrictions Weight Bearing Restrictions: No General:   Vital Signs:   Pain:   ADL: ADL Eating: Minimal assistance,Minimal cueing Where Assessed-Eating: Bed level Grooming: Setup Where Assessed-Grooming: Sitting at sink Upper Body Bathing: Minimal assistance,Minimal cueing Where  Assessed-Upper Body Bathing: Edge of bed Lower Body Bathing: Moderate assistance Where Assessed-Lower Body Bathing: Edge of bed Upper Body Dressing: Minimal assistance,Minimal cueing Where Assessed-Upper Body Dressing: Edge of bed Lower Body Dressing: Moderate assistance,Minimal cueing Where Assessed-Lower Body Dressing: Edge of bed Toileting: Maximal assistance,Minimal cueing Where Assessed-Toileting: Bedside Commode Toilet Transfer: Maximal assistance Toilet Transfer Method: Stand pivot Toilet Transfer Equipment: Drop arm bedside commode Vision   Perception    Praxis   Exercises:   Other Treatments:     Therapy/Group: Individual Therapy  [name] 12/31/2019, 12:44 PM

## 2020-01-01 ENCOUNTER — Inpatient Hospital Stay (HOSPITAL_COMMUNITY): Payer: Self-pay | Admitting: Speech Pathology

## 2020-01-01 ENCOUNTER — Inpatient Hospital Stay (HOSPITAL_COMMUNITY): Payer: Self-pay

## 2020-01-01 NOTE — Progress Notes (Signed)
[place] PHYSICAL MEDICINE & REHABILITATION PROGRESS NOTE   Subjective/Complaints: Pt working with therapy on dressing. Still struggles with limb placement, initiation. OT noticed some delays in speech over last few days but otherwise no changes. Pt hadn't been sleeping too well up until the night before last. Says that sleep and headaches are better with topamax on board.   ROS: Patient denies fever, rash, sore throat, blurred vision, nausea, vomiting, diarrhea, cough, shortness of breath or chest pain, joint or back pain, headache, or mood change. .    .      Objective:   No results found. No results for input(s): WBC, HGB, HCT, PLT in the last 72 hours. No results for input(s): NA, K, CL, CO2, GLUCOSE, BUN, CREATININE, CALCIUM in the last 72 hours.  Intake/Output Summary (Last 24 hours) at 01/01/2020 1039 Last data filed at 01/01/2020 0850 Gross per 24 hour  Intake 680 ml  Output --  Net 680 ml    Physical Exam: Vital Signs Blood pressure 109/64, pulse 90, temperature 98 F (36.7 C), resp. rate 16, height 5\' 2"  (1.575 m), weight 46.7 kg, SpO2 97 %.  Constitutional: No distress . Vital signs reviewed. HEENT: EOMI, oral membranes moist Neck: supple Cardiovascular: RRR without murmur. No JVD    Respiratory/Chest: CTA Bilaterally without wheezes or rales. Normal effort    GI/Abdomen: BS +, non-tender, non-distended Ext: no clubbing, cyanosis, or edema Psych: pleasant and cooperative Neuro:  right central 7, mild dysarthria.  Dysconjugate gaze still present.  Provides name and age.  alert and aware. More aware of left side. Apraxic with right arm and leg. RUE 3 to 3+ SA, EE, EF, 4-/5 hand grip and WE RLE 3+/5 HF, KE, 4/5 DF, PF.   Right side withongoing deficits in LT,PP which lead to decreased coordination    Assessment/Plan: 1. Functional deficits which require 3+ hours per day of interdisciplinary therapy in a comprehensive inpatient rehab setting.  Physiatrist is  providing close team supervision and 24 hour management of active medical problems listed below.  Physiatrist and rehab team continue to assess barriers to discharge/monitor patient progress toward functional and medical goals  Care Tool:  Bathing    Body parts bathed by patient: Right arm,Left arm,Chest,Abdomen,Front perineal area,Right upper leg,Left upper leg,Face,Right lower leg,Left lower leg   Body parts bathed by helper: Buttocks     Bathing assist Assist Level: Minimal Assistance - Patient > 75%     Upper Body Dressing/Undressing Upper body dressing   What is the patient wearing?: Pull over shirt    Upper body assist Assist Level: Supervision/Verbal cueing    Lower Body Dressing/Undressing Lower body dressing      What is the patient wearing?: Incontinence brief     Lower body assist Assist for lower body dressing: Moderate Assistance - Patient 50 - 74%     Toileting Toileting    Toileting assist Assist for toileting: Maximal Assistance - Patient 25 - 49%     Transfers Chair/bed transfer  Transfers assist     Chair/bed transfer assist level: Minimal Assistance - Patient > 75%     Locomotion Ambulation   Ambulation assist      Assist level: Moderate Assistance - Patient 50 - 74% Assistive device: Walker-rolling (with R hand splint) Max distance: 30 ft   Walk 10 feet activity   Assist  Walk 10 feet activity did not occur: Safety/medical concerns (R hemi, R pusher, decreased motor planning, R inattention)  Assist level: Moderate Assistance -  Patient - 50 - 74% Assistive device: Walker-rolling   Walk 50 feet activity   Assist Walk 50 feet with 2 turns activity did not occur: Safety/medical concerns         Walk 150 feet activity   Assist Walk 150 feet activity did not occur: Safety/medical concerns         Walk 10 feet on uneven surface  activity   Assist Walk 10 feet on uneven surfaces activity did not occur:  Safety/medical concerns         Wheelchair     Assist Will patient use wheelchair at discharge?: Yes Type of Wheelchair: Manual    Wheelchair assist level: Supervision/Verbal cueing Max wheelchair distance: 25 ft    Wheelchair 50 feet with 2 turns activity    Assist        Assist Level: Supervision/Verbal cueing   Wheelchair 150 feet activity     Assist      Assist Level: Supervision/Verbal cueing   Blood pressure 109/64, pulse 90, temperature 98 F (36.7 C), resp. rate 16, height 5\' 2"  (1.575 m), weight 46.7 kg, SpO2 97 %.  Medical Problem List and Plan: 1.  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 Team continues to provide positive reinforcement re: progress. ?cognitive slowing this week. She appears fairly stable to me at this point. I believe poor sleep impacted her this week, however. Will continue to monitor closely.    2.  Antithrombotics: -DVT/anticoagulation: Subcutaneous heparin             -antiplatelet therapy: N/A 3. Pain Management: Tylenol as needed- for occasional left sided headache  -dental caries   -chlorhexidine rinse--change to PRN   - Will need outpt dentistry f/u at some point (doesn't have dentist)  12/17 headaches (and sleep) improved with low dose topiramate at HS--monitor for med tolerance 4. Mood: Provide emotional support             -antipsychotic agents: N/A  - prn trazodone to assist with sleep 5. Neuropsych: This patient is capable of making decisions on her own behalf. 6. Skin/Wound Care: Routine skin checks 7. Fluids/Electrolytes/Nutrition: encouraging PO 8.  Hypertension.  Norvasc 5 mg daily.    12/11 bp low, decreased Norvasc to 2.5mg .   12/17: excellent control, continue norvasc  2.5mg  9.  Tobacco abuse.  NicoDerm patch.  Provide counseling 10.  History of alcohol use.  Provide counseling. Continue folic acid supplement 11. Tachycardic to 103 on admission-      resolved  12/16: HR incontrolled currently  12. Urinary retention: pvr's generally less than 200, urine still cloudy  12/2-ucx with 100k aerococcus urinae.    -amoxil 12/2-12/4 completed 13. Acute blood loss anemia: Monitor Hgb weekly. 11.8 on 11/30 14. Multiple BM per day: stopped senna-docusate--formed stool 12/3  12/8 -resumed senna-s, miralax  Moving bowels regularly now bm 12/17     LOS: 18 days A FACE TO FACE EVALUATION WAS PERFORMED  1/18 01/01/2020, 10:39 AM

## 2020-01-01 NOTE — Progress Notes (Signed)
Physical Therapy Session Note  Patient Details  Name: Melissa Colon MRN: [id number] Date of Birth: 1960-04-01  Today's Date: 01/01/2020 PT Individual Time: 1100-1200 PT Individual Time Calculation (min): 60 min   Short Term Goals: Week 1:  PT Short Term Goal 1 (Week 1): Patient will perform bed mobility with supervision. PT Short Term Goal 1 - Progress (Week 1): Met PT Short Term Goal 2 (Week 1): Patient will perform basic transfers with mod A consistently. PT Short Term Goal 2 - Progress (Week 1): Met PT Short Term Goal 3 (Week 1): Patient will ambulate 25 ft with mod A using LRAD. PT Short Term Goal 3 - Progress (Week 1): Met Week 2:  PT Short Term Goal 1 (Week 2): Patient will perform basic transfers with min A consistently using LRAD. PT Short Term Goal 1 - Progress (Week 2): Progressing toward goal PT Short Term Goal 2 (Week 2): Patient will ambulate >50 ft with +1-2 assist using LRAD. PT Short Term Goal 2 - Progress (Week 2): Progressing toward goal PT Short Term Goal 3 (Week 2): Patient will propel w/c >50 ft in household set-up with distant supervision. PT Short Term Goal 3 - Progress (Week 2): Met Week 3:  PT Short Term Goal 1 (Week 3): STG=LTG due to ELOS.  Skilled Therapeutic Interventions/Progress Updates:    PAIn - denies pain Pt seen this am for session w/focus on wc skills/functional mobility and standing activities. Pt initially oob in wc.   wc mobility/L hemi technique -  Room to hallway x 42f w/cues to attend to R environment, mod assist to coordinate use of RLE for steering wc, max cues to attend to R  Functional wc course - weaving around obstacles placed 4-670fintervals, initially needed mod assist to coordinate use of LLE for steering/for problem solving/motor planning. Sit to stand w/max assist and wc cusion removed from wc to decrease seat to floor height/improve access w/LLE, used gait belt to position RLE due to hip/knee collapsing into IR (previosly  controlled by contoured cushion) Pt then able to utilize LLE better for propulsion, cues still required for steering/attention to R, motor planning esp to negotiate obstacles on R.  Repeated 4059fourse w/8 obstacles x 2.   Standing/gait: Sit to stand in parallel bars w/mod assist, repeated efforts decreased to min assist, set up assist, therapist stabilizing R ankle due to inversion tendency.  Standing balance: static standing pt worked on reaching across body w/rue w/tactile cues to guide movment, min assist and cues for balance/posture.  Gait 8ft60f2 in parallel bars w/tactile cues for posture, advancement of RUE on bars, manual stabilization of R ankle due to inversion/extensor tone..  repeated x 2, overall min to mod assist/cueing.  Requested PA order airsplint for protection of R ankle/trial w/gait.  Pt transported to room. stand pivot transfer wc to bed w/mod assist, therapist guarding R ankle between feet to prevent inv., cues to fully position r hip on bed.  Sit to supine w/cga and max cues for attention to R exts. Pt left supine w/rails up x 3, alarm set, bed in lowest position, and needs in reach.   Therapy Documentation Precautions:  Precautions Precautions: Fall Precaution Comments: R hemiparesis and incoordination, pushing to R in stand Restrictions Weight Bearing Restrictions: Yes   Therapy/Group: Individual Therapy  BarbCallie Fielding  [place]17/2021, 12:24 PM

## 2020-01-01 NOTE — Progress Notes (Signed)
Occupational Therapy Session Note  Patient Details  Name: Melissa Colon MRN: [id number] Date of Birth: 1960-11-24  Today's Date: 01/01/2020 OT Individual Time: 0900-1012 OT Individual Time Calculation (min): 72 min    Short Term Goals: Week 1:  OT Short Term Goal 1 (Week 1): Pt will don shirt with CGA OT Short Term Goal 1 - Progress (Week 1): Met OT Short Term Goal 2 (Week 1): Pt will require no more than min cueing for positioning of her RUE OT Short Term Goal 2 - Progress (Week 1): Met OT Short Term Goal 3 (Week 1): Pt will complete toilet transfer with mod A OT Short Term Goal 3 - Progress (Week 1): Met  Skilled Therapeutic Interventions/Progress Updates:    1:1. Pt received in bed agreeable to OT with no report of pain. Blocked practice of toilet transfers to [facility] completed in prep for DC as pt will only have access to standard BSC no drop arm available. Pt requires MIN A for tall sqaut pivot to L and MOD A to L with L knee block and facilitation on L hand grasp maintainace on armrests w/c<>BSC. Pt requires MIN A for reciprocal scooting to EOC w/c/BSC. Pt voicing feelings of depression related to CLOF. Support and encouragement provided. Pt given red foam handle for colored pencils and able to maintain grasp on pencil. Pt able to mark lines on paper with glasses partially patch to prevent double vision. Pt unable to fully trace shapes d/t ataxia, shoulder weakness and decreased sensation impacting coordination. Exited session with pt seated in w.c, exit alarm on and call light in reach   Therapy Documentation Precautions:  Precautions Precautions: Fall Precaution Comments: R hemiparesis and incoordination, pushing to R in stand Restrictions Weight Bearing Restrictions: No General:   Vital Signs: Therapy Vitals Temp: 98 F (36.7 C) Pulse Rate: 90 Resp: 16 BP: 109/64 Oxygen Therapy SpO2: 97 % O2 Device: Room Air Pain: Pain Assessment Pain Score:  Asleep ADL: ADL Eating: Minimal assistance,Minimal cueing Where Assessed-Eating: Bed level Grooming: Setup Where Assessed-Grooming: Sitting at sink Upper Body Bathing: Minimal assistance,Minimal cueing Where Assessed-Upper Body Bathing: Edge of bed Lower Body Bathing: Moderate assistance Where Assessed-Lower Body Bathing: Edge of bed Upper Body Dressing: Minimal assistance,Minimal cueing Where Assessed-Upper Body Dressing: Edge of bed Lower Body Dressing: Moderate assistance,Minimal cueing Where Assessed-Lower Body Dressing: Edge of bed Toileting: Maximal assistance,Minimal cueing Where Assessed-Toileting: Bedside Commode Toilet Transfer: Maximal assistance Toilet Transfer Method: Stand pivot Toilet Transfer Equipment: Drop arm bedside commode Vision   Perception    Praxis   Exercises:   Other Treatments:     Therapy/Group: Individual Therapy  [name] 01/01/2020, 6:57 AM

## 2020-01-01 NOTE — Progress Notes (Signed)
Orthopedic Tech Progress Note Patient Details:  Melissa Colon [birth year] [id number]  Ortho Devices Type of Ortho Device: Ankle Air splint Ortho Device/Splint Location: RLE Ortho Device/Splint Interventions: Ordered,Application,Adjustment   Post Interventions Patient Tolerated: Well Instructions Provided: Care of device,Adjustment of device,Poper ambulation with device      01/01/2020, 2:24 PM

## 2020-01-01 NOTE — Progress Notes (Signed)
Speech Language Pathology Daily Session Note  Patient Details  Name: Melissa Colon MRN: [id number] Date of Birth: 1960/06/01  Today's Date: 01/01/2020 SLP Individual Time: 1345-1445 SLP Individual Time Calculation (min): 60 min  Short Term Goals: Week 3: SLP Short Term Goal 1 (Week 3): STG=LTG due to short ELOS (expected 12/21)  Skilled Therapeutic Interventions:   Patient in bed but agreeable for therapy session and was much more alert and interactive today as compared to yesterday. She completed basic level money management tasks (coins and bills): counting money, making change. She exhibited delayed processing, required extra time and min-modA cues overall to perform. She did not demonstrate adequate self-monitoring as she reported she felt she did pretty well on task. During task of reading medication labels, patient wanted to switch tape patch from left eye lens to right, which she did perform, however she reported it didn't seem to help with her double vision. She did demonstrate improved accuracy with simulated pill box task today as compared to previous visit. Patient continues to benefit from skilled SLP intervention to maximize cognitive-linguistic and speech goals prior to discharge.   Pain Pain Assessment Pain Scale: 0-10 Pain Score: 0-No pain  Therapy/Group: Individual Therapy  Angela Nevin, MA, CCC-SLP Speech Therapy

## 2020-01-01 NOTE — Progress Notes (Signed)
Patient ID: Melissa Colon, female   DOB: Apr 06, 1960, 59 y.o.   MRN: [id number]  SW left message for [name] [phone]) to request return phone call to discuss some items pt will need at d/c (RW, DABSC-unable to get under charity, ?hospital bed, and donor w/c through [facility]). SW waiting on follow-up.   Pt entered into system for MATCH medication assistance program.   Cecile Sheerer, MSW, LCSWA Office: [phone] Cell: [phone] Fax: [phone]

## 2020-01-02 ENCOUNTER — Inpatient Hospital Stay (HOSPITAL_COMMUNITY): Payer: Self-pay | Admitting: Occupational Therapy

## 2020-01-02 ENCOUNTER — Ambulatory Visit (HOSPITAL_COMMUNITY): Payer: Self-pay | Admitting: Physical Therapy

## 2020-01-02 NOTE — Progress Notes (Signed)
[facility] PHYSICAL MEDICINE & REHABILITATION PROGRESS NOTE   Subjective/Complaints:  Pt reports doing fine- LBM 12/17   ROS:  Pt denies SOB, abd pain, CP, N/V/C/D, and vision changes   Objective:   No results found. No results for input(s): WBC, HGB, HCT, PLT in the last 72 hours. No results for input(s): NA, K, CL, CO2, GLUCOSE, BUN, CREATININE, CALCIUM in the last 72 hours.  Intake/Output Summary (Last 24 hours) at 01/02/2020 1322 Last data filed at 01/02/2020 0700 Gross per 24 hour  Intake 400 ml  Output --  Net 400 ml    Physical Exam: Vital Signs Blood pressure 116/75, pulse 75, temperature 98.2 F (36.8 C), resp. rate 16, height 5\' 2"  (1.575 m), weight 46.7 kg, SpO2 98 %.  Constitutional: awake,alert, sitting up in bed- RN in room, NAD HEENT: EOMI, oral membranes moist- tape on eyeglasses R lens Neck: supple Cardiovascular: RRR   Respiratory/Chest: CTA B/L- no W/R/R- good air movement    GI/Abdomen: Soft, NT, ND, (+)BS   Ext: no clubbing, cyanosis, or edema Psych: pleasant and cooperative Neuro:  right central 7, mild dysarthria.  Dysconjugate gaze still present.  Provides name and age.  alert and aware. More aware of left side. Apraxic with right arm and leg. RUE 3 to 3+ SA, EE, EF, 4-/5 hand grip and WE RLE 3+/5 HF, KE, 4/5 DF, PF.   Right side withongoing deficits in LT,PP which lead to decreased coordination    Assessment/Plan: 1. Functional deficits which require 3+ hours per day of interdisciplinary therapy in a comprehensive inpatient rehab setting.  Physiatrist is providing close team supervision and 24 hour management of active medical problems listed below.  Physiatrist and rehab team continue to assess barriers to discharge/monitor patient progress toward functional and medical goals  Care Tool:  Bathing    Body parts bathed by patient: Right arm,Left arm,Chest,Abdomen,Front perineal area,Right upper leg,Left upper leg,Face,Right lower  leg,Left lower leg   Body parts bathed by helper: Buttocks     Bathing assist Assist Level: Minimal Assistance - Patient > 75%     Upper Body Dressing/Undressing Upper body dressing   What is the patient wearing?: Pull over shirt    Upper body assist Assist Level: Supervision/Verbal cueing    Lower Body Dressing/Undressing Lower body dressing      What is the patient wearing?: Incontinence brief     Lower body assist Assist for lower body dressing: Moderate Assistance - Patient 50 - 74%     Toileting Toileting    Toileting assist Assist for toileting: Maximal Assistance - Patient 25 - 49%     Transfers Chair/bed transfer  Transfers assist     Chair/bed transfer assist level: Moderate Assistance - Patient 50 - 74% (no device)     Locomotion Ambulation   Ambulation assist      Assist level: Moderate Assistance - Patient 50 - 74% Assistive device: Parallel bars Max distance: 8   Walk 10 feet activity   Assist  Walk 10 feet activity did not occur: Safety/medical concerns (R hemi, R pusher, decreased motor planning, R inattention)  Assist level: Moderate Assistance - Patient - 50 - 74% Assistive device: Walker-rolling   Walk 50 feet activity   Assist Walk 50 feet with 2 turns activity did not occur: Safety/medical concerns         Walk 150 feet activity   Assist Walk 150 feet activity did not occur: Safety/medical concerns  Walk 10 feet on uneven surface  activity   Assist Walk 10 feet on uneven surfaces activity did not occur: Safety/medical concerns         Wheelchair     Assist Will patient use wheelchair at discharge?: Yes Type of Wheelchair: Manual    Wheelchair assist level: Supervision/Verbal cueing Max wheelchair distance: 25 ft    Wheelchair 50 feet with 2 turns activity    Assist        Assist Level: Supervision/Verbal cueing   Wheelchair 150 feet activity     Assist      Assist Level:  Supervision/Verbal cueing   Blood pressure 116/75, pulse 75, temperature 98.2 F (36.8 C), resp. rate 16, height 5\' 2"  (1.575 m), weight 46.7 kg, SpO2 98 %.  Medical Problem List and Plan: 1.  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 Team continues to provide positive reinforcement re: progress. ?cognitive slowing this week. She appears fairly stable to me at this point. I believe poor sleep impacted her this week, however. Will continue to monitor closely.    2.  Antithrombotics: -DVT/anticoagulation: Subcutaneous heparin             -antiplatelet therapy: N/A 3. Pain Management: Tylenol as needed- for occasional left sided headache  -dental caries   -chlorhexidine rinse--change to PRN   - Will need outpt dentistry f/u at some point (doesn't have dentist)  12/17 headaches (and sleep) improved with low dose topiramate at HS--monitor for med tolerance  12/18- no complaints- con't regimen 4. Mood: Provide emotional support             -antipsychotic agents: N/A  - prn trazodone to assist with sleep 5. Neuropsych: This patient is capable of making decisions on her own behalf. 6. Skin/Wound Care: Routine skin checks 7. Fluids/Electrolytes/Nutrition: encouraging PO 8.  Hypertension.  Norvasc 5 mg daily.    12/11 bp low, decreased Norvasc to 2.5mg .   12/17: excellent control, continue norvasc 2.5mg   12/18- BP great control- con't regimen 9.  Tobacco abuse.  NicoDerm patch.  Provide counseling 10.  History of alcohol use.  Provide counseling. Continue folic acid supplement 11. Tachycardic to 103 on admission-      resolved  12/16: HR incontrolled currently  12. Urinary retention: pvr's generally less than 200, urine still cloudy  12/2-ucx with 100k aerococcus  urinae.    -amoxil 12/2-12/4 completed 13. Acute blood loss anemia: Monitor Hgb weekly. 11.8 on 11/30 14. Multiple BM per day: stopped senna-docusate--formed stool 12/3  12/8 -resumed senna-s, miralax  12/18- LBM yesterday con't regimen  Moving bowels regularly now bm 12/17     LOS: 19 days A FACE TO FACE EVALUATION WAS PERFORMED    01/02/2020, 1:22 PM

## 2020-01-02 NOTE — Progress Notes (Signed)
Occupational Therapy Session Note  Patient Details  Name: Melissa Colon MRN: [id number] Date of Birth: 11/05/1960  Today's Date: 01/02/2020 OT Individual Time: 5885-0277 OT Individual Time Calculation (min): 30 min    Short Term Goals: Week 3:  OT Short Term Goal 1 (Week 3): Pt will don LB clothing with min A OT Short Term Goal 2 (Week 3): Pt will complete toilet or BSC transfer with min A OT Short Term Goal 3 (Week 3): Pt will incorporate her RUE into bathing tasks with no more than min cueing  Skilled Therapeutic Interventions/Progress Updates:    Patient greeted semi-reclined in bed and agreeable to OT treatment session. Pt declined to go to the bathroom, bathe, or change clothes. Pt completed bed mobility with increased time and min A. OT assist to don R air cast and shoes. Squat-pivot transfer with min/mod A and facilitation for head/hips relationship. R UR NMR with arm slides across table using pillowcase to decrease friction. Focus on shoulder adduction/abduction, elbow fle/ext. Seated chest press with pt able to maintain grip on weightless dowel rod and guided A from OT to achieve further R should flexion. Pt returned to room in wc and left seated in wc with chair arlarm on, call bell in reach, and needs met.   Therapy Documentation Precautions:  Precautions Precautions: Fall Precaution Comments: R hemiparesis and incoordination, pushing to R in stand Restrictions Weight Bearing Restrictions: No Pain: Pain Assessment Pain Scale: 0-10 Pain Score: 0-No pain  Therapy/Group: Individual Therapy  Valma Cava 01/02/2020, 11:10 AM

## 2020-01-02 NOTE — Progress Notes (Signed)
Physical Therapy Session Note  Patient Details  Name: Melissa Colon MRN: [id number] Date of Birth: Jan 21, 1960  Today's Date: 01/02/2020 PT Individual Time: 1300-1345 PT Individual Time Calculation (min): 45 min   Short Term Goals: Week 3:  PT Short Term Goal 1 (Week 3): STG=LTG due to ELOS.  Skilled Therapeutic Interventions/Progress Updates:   Pt received supine in bed, asleep. Aroused with little effort and agreeable to PT. Family not present this day. Supine>sit with min assist from PT for RRLE placement and cues for use of BUE to push into sitting. Sitting balance EOB with supervision assist to don shoes. Lateral scoot/squat pivot transfer to the L with min assist to stabilize the RLE. Pt propelled to rehab gym. PT donned Air cast to RLE. Sit<>stand x 5 with min-mod assist from PT and BUE support on RW. Pt attempted to stand by pushing from arm rest, but unable to achieve standing. Gait training with aircast and RW x 16f with min assist fading to mod assist for the last 5 ft due to fatigue and increased R lateral lean. [facility] and mod cues for gait pattern. Pt returned to room and performed squat pivot transfer to bed with mod assist on the R. Sit>supine completed with min assist for RLE management, and left supine in bed with call bell in reach and all needs met.        Therapy Documentation Precautions:  Precautions Precautions: Fall Precaution Comments: R hemiparesis and incoordination, pushing to R in stand Restrictions Weight Bearing Restrictions: No    Vital Signs: Therapy Vitals Temp: 97.8 F (36.6 C) Pulse Rate: 92 Resp: 16 BP: 111/67 Patient Position (if appropriate): Lying Oxygen Therapy SpO2: 98 % O2 Device: Room Air Pain:   denies    Therapy/Group: Individual Therapy  ALorie Phenix12/18/2021, 1:51 PM

## 2020-01-03 ENCOUNTER — Ambulatory Visit (HOSPITAL_COMMUNITY): Payer: Self-pay

## 2020-01-03 ENCOUNTER — Encounter (HOSPITAL_COMMUNITY): Payer: Self-pay | Admitting: Occupational Therapy

## 2020-01-03 ENCOUNTER — Encounter (HOSPITAL_COMMUNITY): Payer: Self-pay | Admitting: Speech Pathology

## 2020-01-03 NOTE — Progress Notes (Signed)
Speech Language Pathology Daily Session Note  Patient Details  Name: Melissa Colon MRN: [id number] Date of Birth: 05-23-1960  Today's Date: 01/03/2020 SLP Individual Time: 5277-8242 SLP Individual Time Calculation (min): 59 min  Short Term Goals: Week 3: SLP Short Term Goal 1 (Week 3): STG=LTG due to short ELOS (expected 12/21)  Skilled Therapeutic Interventions:  Pt was seen for skilled ST targeting family education prior to 12/21 discharge.  Pt's brother and niece were present throughout therapy session and remain actively engaged throughout all training tasks.  SLP discussed current goals and progress in therapy.  Pt and family both endorse improvements from when initially admitted to hospital but that pt is not yet returned to baseline in regards to her cognitive-linguistic functioning.  SLP discussed compensatory strategies for word finding and ways that pt's family can facilitate pt's independence with functional communication.  SLP also provided skilled education regarding speech intelligibility strategies, emphasizing use of slow rate, loud voice, and overarticulation.  Finally, SLP discussed pt's current level of cognitive functioning in the context of a cognitive hierarchy and how pt's current deficits can lead to deficits in other areas of higher level cognitive processes.  SLP discussed distraction management strategies, memory compensatory strategies, and strategies to minimize errors and maximize safety with problem solving.  SLP recommended that pt have assistance for medication and financial management at discharge.  All questions were answered to pt's and family's satisfaction at this time and family plans to return for additional education tomorrow.  Pt was left in bed with bed alarm set and family at bedside.  Continue per current plan of care.    Pain Pain Assessment Pain Scale: 0-10 Pain Score: 0-No pain  Therapy/Group: Individual Therapy  , Melanee Spry 01/03/2020,  12:37 PM

## 2020-01-03 NOTE — Progress Notes (Signed)
Physical Therapy Session Note  Patient Details  Name: Melissa Colon MRN: [id number] Date of Birth: 07/19/1960  Today's Date: 01/03/2020 PT Individual Time: 1300-1405 PT Individual Time Calculation (min): 65 min   Short Term Goals: Week 3:  PT Short Term Goal 1 (Week 3): STG=LTG due to ELOS.  Skilled Therapeutic Interventions/Progress Updates:     Patient in bed with her son-in-law and niece in the room for family education upon PT arrival. Patient alert and agreeable to PT session. Patient denied pain during session. Discussed family education schedule, as there was confusion or changes to the time this morning. Discussed home set-up and equipment recommendations, manual w/c, RW, and options for hospital bed due to elevated bed height at home. Plan to bring home measurements tomorrow and continue discussion.   Discussed bumping patient up front steps due inability to place a ramp at the home, will perform hands on training during education tomorrow due to time constraints during this session. Focused session on transfer training to various household surfaces and w/c mobility.   Therapeutic Activity: Bed Mobility: Patient performed supine to/from sit with min A-CGA. Provided verbal cues for technique and allowing patient to perform as much as she is able without additional assist. Donned/doffed B tennis shoes for mobility with total A for time management Transfers: Patient performed squat piviot transfers elevated bed<>w/c and w/c<>mat table x2 with min A and simulated sedan height car transfers x2 with mod A. PT performed first squat pivot and car transfer then family performed the other transfers with mod progressing to min cues for safe positioning and management of patient L extremities for safety. Demonstrated and cues blocking patient's L foot throughout transfers. Educated on air cast and tennis shoes being worn for all transfers to reduce inversion of L foot with transfers, patient and  her family stated understanding.   Wheelchair Mobility:  Patient propelled wheelchair >150 feet with supervision using L hemi-technique. Provided verbal cues for turning technique and avoiding objects on the R x3. Educated on supervision for w/c mobility at home and increased space for safe w/c mobility and awareness of patient's inattention to the R for safety. Patient's family stated understanding. Demonstrated management of w/c parts and patient and her family participated in management with min cues throughout session.   Discussed focusing on management of RW for transfers and stair training with patient in the w/c tomorrow during family education. Patient and family in agreement.   Patient in bed with her family and RN in the room at end of session with breaks locked, bed alarm set, and all needs within reach.    Therapy Documentation Precautions:  Precautions Precautions: Fall Precaution Comments: R hemiparesis and incoordination, pushing to R in stand Restrictions Weight Bearing Restrictions: No   Therapy/Group: Individual Therapy   L  PT, DPT  01/03/2020, 4:53 PM

## 2020-01-03 NOTE — Discharge Summary (Signed)
Physician Discharge Summary  Patient ID: Melissa Colon MRN: [id number] DOB/AGE: 57-Apr-1962 57 y.o.  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  Headache. EXAM: CT HEAD WITHOUT CONTRAST TECHNIQUE: Contiguous axial images were obtained from the base of the skull through the vertex without intravenous contrast. COMPARISON:  CT head without contrast 12/10/2019. MR head without contrast 12/10/2019 FINDINGS: Brain: Left thalamic hemorrhage is stable, measuring 2.4 cm maximally. Surrounding edema is as expected. No new hemorrhage or expansion is present. Moderate atrophy and white matter changes are present. Remote infarct of the left parietal lobe is again noted. Postsurgical encephalomalacia is present in the right cerebellum. Left cerebellar calcifications are stable. The ventricles are proportionate to the degree of atrophy. No significant extraaxial fluid collection is present. Vascular: Atherosclerotic calcifications are present within the cavernous internal carotid arteries. No hyperdense vessel is present. Skull: Right occipital craniotomy is noted. Left parietal burr hole noted. Calvarium otherwise intact. No significant extracranial soft tissue lesion is present. Sinuses/Orbits: The paranasal sinuses and mastoid air cells are clear. The globes and orbits are within normal limits. IMPRESSION: 1. Stable left thalamic hemorrhage. 2. No new hemorrhage or expansion. 3. Stable atrophy and white matter disease. 4. Remote infarct of the left parietal  lobe. 5. Postsurgical encephalomalacia in the right cerebellum. Electronically Signed   By: Marin Robertshristopher  Mattern M.D.   On: 12/10/2019 21:23   MR MRA HEAD WO CONTRAST  Result Date: 12/10/2019 CLINICAL DATA:  Code stroke. Neurologic deficit. Cerebral hemorrhage. Right hemiparesis. Left gaze preference. EXAM: MRI HEAD WITHOUT CONTRAST MRA HEAD WITHOUT CONTRAST TECHNIQUE: Multiplanar, multiecho pulse sequences of the brain and surrounding structures were obtained without intravenous contrast. Angiographic images of the head were obtained using MRA technique without contrast. COMPARISON:  None. CT head without contrast 12/10/2019 FINDINGS: MRI HEAD FINDINGS Brain: Acute hemorrhage in the left thalamus measuring 2.4 cm maximally is stable. Surrounding edema extends into the left cerebral peduncle and superiorly into the posterior limb left internal capsule. There is some focal mass effect. No additional hemorrhage is evident. Mass lesion is evident. Moderate generalized atrophy is present. Periventricular and subcortical T2 hyperintensities are present bilaterally. White matter changes extend into the brainstem. Chronic encephalomalacia is noted in the right cerebellum with prior craniotomy. Calcifications are again noted at the undersurface of the left cerebellum. The ventricles are proportionate to the degree of atrophy. No significant extraaxial fluid collection is present. Internal auditory canals are unremarkable. Vascular: Flow is present in the major intracranial arteries. Skull and upper cervical spine: Marrow spaces demonstrate extensive T1 shortening. Question prior radiation. Craniocervical junction is otherwise normal. Sinuses/Orbits: Mild mucosal thickening is present throughout the anterior paranasal sinuses. Fluid is present in the mastoid air cells bilaterally, left greater than right. The globes and orbits are within normal limits. MRA HEAD FINDINGS Internal carotid minimal atherosclerotic  irregularity is present within arteries bilaterally the cavernous. 1.5 mm posterior communicating artery aneurysms are noted bilaterally. ICA termini are otherwise normal. The A1 and M1 segments are within normal limits. The anterior communicating artery is patent. ACA and MCA branch vessels are within normal limits. The right vertebral artery is dominant. Left V4 segment  is hypoplastic. PICA origin is noted on the left. Basilar artery is within limits. High-grade proximal left P2 segment stenosis is present. Distal left PCA branch vessels are not visualized. Mild distal atherosclerotic changes are present in the right PCA branches which are otherwise intact. IMPRESSION: 1. Stable 2.4 cm acute hemorrhage in the left thalamus with edema extending into the left cerebral peduncle and posterior limb left internal capsule. 2. Moderate generalized atrophy and white matter disease likely reflects the sequela of chronic microvascular ischemia. 3. 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 4. High-grade proximal left P2 segment stenosis. This could correlate with the thalamic hemorrhage. No infarct present in the more distal left PCA territory. 5. No other significant proximal stenosis, aneurysm, or branch vessel occlusion within the Circle of Willis. No vascular malformation. Electronically Signed   By: Marin Roberts M.D.   On: 12/10/2019 18:30   MR BRAIN WO CONTRAST  Result Date: 12/10/2019 CLINICAL DATA:  Code stroke. Neurologic deficit. Cerebral hemorrhage. Right hemiparesis. Left gaze preference. EXAM: MRI HEAD WITHOUT CONTRAST MRA HEAD WITHOUT CONTRAST TECHNIQUE: Multiplanar, multiecho pulse sequences of the brain and surrounding structures were obtained without intravenous contrast. Angiographic images of the head were obtained using MRA technique without contrast. COMPARISON:  None. CT head without contrast 12/10/2019 FINDINGS: MRI HEAD FINDINGS Brain: Acute hemorrhage in the left  thalamus measuring 2.4 cm maximally is stable. Surrounding edema extends into the left cerebral peduncle and superiorly into the posterior limb left internal capsule. There is some focal mass effect. No additional hemorrhage is evident. Mass lesion is evident. Moderate generalized atrophy is present. Periventricular and subcortical T2 hyperintensities are present bilaterally. White matter changes extend into the brainstem. Chronic encephalomalacia is noted in the right cerebellum with prior craniotomy. Calcifications are again noted at the undersurface of the left cerebellum. The ventricles are proportionate to the degree of atrophy. No significant extraaxial fluid collection is present. Internal auditory canals are unremarkable. Vascular: Flow is present in the major intracranial arteries. Skull and upper cervical spine: Marrow spaces demonstrate extensive T1 shortening. Question prior radiation. Craniocervical junction is otherwise normal. Sinuses/Orbits: Mild mucosal thickening is present throughout the anterior paranasal sinuses. Fluid is present in the mastoid air cells bilaterally, left greater than right. The globes and orbits are within normal limits. MRA HEAD FINDINGS Internal carotid minimal atherosclerotic irregularity is present within arteries bilaterally the cavernous. 1.5 mm posterior communicating artery aneurysms are noted bilaterally. ICA termini are otherwise normal. The A1 and M1 segments are within normal limits. The anterior communicating artery is patent. ACA and MCA branch vessels are within normal limits. The right vertebral artery is dominant. Left V4 segment is hypoplastic. PICA origin is noted on the left. Basilar artery is within limits. High-grade proximal left P2 segment stenosis is present. Distal left PCA branch vessels are not visualized. Mild distal atherosclerotic changes are present in the right PCA branches which are otherwise intact. IMPRESSION: 1. Stable 2.4 cm acute  hemorrhage in the left thalamus with edema extending into the left cerebral peduncle and posterior limb left internal capsule. 2. Moderate generalized atrophy and white matter disease likely reflects the sequela of chronic microvascular ischemia. 3. 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 4. High-grade proximal left P2 segment stenosis. This could correlate with the thalamic hemorrhage. No infarct present in the more distal left PCA territory. 5. No other significant proximal stenosis, aneurysm, or branch vessel occlusion within the Circle of Willis. No vascular malformation. Electronically Signed   By: Cristal Deer  Mattern M.D.   On: 12/10/2019 18:30   ECHOCARDIOGRAM COMPLETE  Result Date: 12/11/2019    ECHOCARDIOGRAM REPORT   Patient Name:   TEMPRANCE WYRE Date of Exam: 12/11/2019 Medical Rec #:  [id number]         Height: Accession #:    [id number]        Weight:       110.0 lb Date of Birth:  28-Aug-1960         BSA:          1.356 m Patient Age:    59 years          BP:           140/83 mmHg Patient Gender: F                 HR:           79 bpm. Exam Location:  Inpatient Procedure: 2D Echo, Cardiac Doppler and Color Doppler Indications:    CVA  History:        Patient has no prior history of Echocardiogram examinations.                 Stroke; Risk Factors:Current Smoker. ETOH dependencce.  Sonographer:    Lavenia Atlas Referring Phys: [id number] [facility]  Sonographer Comments: Image acquisition challenging due to respiratory motion. IMPRESSIONS  1. Left ventricular ejection fraction, by estimation, is 60 to 65%. The left ventricle has normal function. The left ventricle has no regional wall motion abnormalities. Left ventricular diastolic parameters are consistent with Grade I diastolic dysfunction (impaired relaxation).  2. Right ventricular systolic function is normal. The right ventricular size is normal. There is normal pulmonary artery systolic pressure.  3. The  mitral valve is normal in structure. Mild mitral valve regurgitation. No evidence of mitral stenosis.  4. The aortic valve is normal in structure. Aortic valve regurgitation is not visualized. No aortic stenosis is present.  5. The inferior vena cava is normal in size with greater than 50% respiratory variability, suggesting right atrial pressure of 3 mmHg. FINDINGS  Left Ventricle: Left ventricular ejection fraction, by estimation, is 60 to 65%. The left ventricle has normal function. The left ventricle has no regional wall motion abnormalities. The left ventricular internal cavity size was normal in size. There is  no left ventricular hypertrophy. Left ventricular diastolic parameters are consistent with Grade I diastolic dysfunction (impaired relaxation). Normal left ventricular filling pressure. Right Ventricle: The right ventricular size is normal. No increase in right ventricular wall thickness. Right ventricular systolic function is normal. There is normal pulmonary artery systolic pressure. The tricuspid regurgitant velocity is 2.47 m/s, and  with an assumed right atrial pressure of 3 mmHg, the estimated right ventricular systolic pressure is 27.4 mmHg. Left Atrium: Left atrial size was normal in size. Right Atrium: Right atrial size was normal in size. Pericardium: There is no evidence of pericardial effusion. Mitral Valve: The mitral valve is normal in structure. Mild mitral valve regurgitation, with centrally-directed jet. No evidence of mitral valve stenosis. Tricuspid Valve: The tricuspid valve is normal in structure. Tricuspid valve regurgitation is trivial. No evidence of tricuspid stenosis. Aortic Valve: The aortic valve is normal in structure. Aortic valve regurgitation is not visualized. No aortic stenosis is present. Pulmonic Valve: The pulmonic valve was normal in structure. Pulmonic valve regurgitation is not visualized. No evidence of pulmonic stenosis. Aorta: The aortic root is normal in size  and structure. Venous: The inferior vena cava is normal  in size with greater than 50% respiratory variability, suggesting right atrial pressure of 3 mmHg. IAS/Shunts: No atrial level shunt detected by color flow Doppler.  LEFT VENTRICLE PLAX 2D LVIDd:         3.70 cm  Diastology LVIDs:         2.50 cm  LV e' medial:    5.66 cm/s LV PW:         1.00 cm  LV E/e' medial:  11.7 LV IVS:        0.90 cm  LV e' lateral:   9.03 cm/s LVOT diam:     2.10 cm  LV E/e' lateral: 7.3 LV SV:         88 LV SV Index:   65 LVOT Area:     3.46 cm  RIGHT VENTRICLE RV Basal diam:  2.60 cm RV S prime:     7.94 cm/s TAPSE (M-mode): 2.4 cm LEFT ATRIUM             Index       RIGHT ATRIUM          Index LA diam:        2.40 cm 1.77 cm/m  RA Area:     9.05 cm LA Vol (A2C):   24.4 ml 18.00 ml/m RA Volume:   17.10 ml 12.61 ml/m LA Vol (A4C):   19.3 ml 14.24 ml/m LA Biplane Vol: 22.1 ml 16.30 ml/m  AORTIC VALVE LVOT Vmax:   102.00 cm/s LVOT Vmean:  72.900 cm/s LVOT VTI:    0.253 m  AORTA Ao Root diam: 2.90 cm MITRAL VALVE               TRICUSPID VALVE MV Area (PHT): 2.56 cm    TR Peak grad:   24.4 mmHg MV Decel Time: 296 msec    TR Vmax:        247.00 cm/s MV E velocity: 66.00 cm/s MV A velocity: 75.80 cm/s  SHUNTS MV E/A ratio:  0.87        Systemic VTI:  0.25 m                            Systemic Diam: 2.10 cm Rachelle Hora Croitoru MD Electronically signed by Thurmon Fair MD Signature Date/Time: 12/11/2019/11:35:52 AM    Final    CT HEAD CODE STROKE WO CONTRAST  Result Date: 12/10/2019 CLINICAL DATA:  Code stroke.  Neurological deficit, not specified. EXAM: CT HEAD WITHOUT CONTRAST TECHNIQUE: Contiguous axial images were obtained from the base of the skull through the vertex without intravenous contrast. COMPARISON:  None. FINDINGS: Brain: Distant right occipital craniectomy. Encephalomalacia the right cerebellar hemisphere. Chronic calcification of the left cerebellar nuclei. Acute intraparenchymal hemorrhage in the left thalamus  measuring approximately 2.5 x 1.3 x 2.1 Cm (volume = 3.6 cm^3) mild surrounding edema. Elsewhere, cerebral hemispheres show generalized atrophy, markedly advanced for age. Chronic small-vessel ischemic changes of the deep white matter. Old bilateral occipital insults with dystrophic calcification. 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 No hydrocephalus or extra-axial collection. Vascular: There is atherosclerotic calcification of the major vessels at the base of the brain. Skull: Right occipital craniectomy as previously noted. Sinuses/Orbits: Clear/normal Other: None ASPECTS (Alberta Stroke Program Early CT Score) - Ganglionic level infarction (caudate, lentiform nuclei, internal capsule, insula, M1-M3 cortex): 7 - Supraganglionic infarction (M4-M6 cortex): 3 Total score (0-10 with 10 being normal): 10 IMPRESSION: 1. Acute intraparenchymal hemorrhage in the  left thalamus measuring approximately 2.5 x 1.3 x 2.1 cm (volume 3.6 cm^3). Mild surrounding edema. 2. Old bilateral occipital insults with dystrophic calcification. 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 Chronic small-vessel changes of the hemispheric white matter. 3. Distant right occipital craniectomy. Encephalomalacia the right cerebellar hemisphere. 4. ASPECTS is 10. 5. These results were communicated to Dr. Derry Lory At 3:12 pmon 11/25/2021by text page via the [facility] messaging system. Electronically Signed   By: Paulina Fusi M.D.   On: 12/10/2019 15:14   VAS US CAROTID  Result Date: 12/15/2019 Carotid Arterial Duplex Study Indications:  Left thalamus ICH. Risk Factors: Current smoker. Performing Technologist: Gertie Fey MHA, RDMS, RVT, RDCS  Examination Guidelines: A complete evaluation includes B-mode imaging, spectral Doppler, color Doppler, and power Doppler as needed of all accessible portions of each vessel. Bilateral testing is considered an integral part of a complete examination. Limited examinations for  reoccurring indications may be performed as noted.  Right Carotid Findings: +----------+--------+--------+--------+-----------------------+--------+           PSV cm/sEDV cm/sStenosisPlaque Description     Comments +----------+--------+--------+--------+-----------------------+--------+ CCA Prox  109     16                                              +----------+--------+--------+--------+-----------------------+--------+ CCA Distal99      27              smooth and heterogenous         +----------+--------+--------+--------+-----------------------+--------+ ICA Prox  70      28              smooth and heterogenous         +----------+--------+--------+--------+-----------------------+--------+ ICA Distal116     42                                              +----------+--------+--------+--------+-----------------------+--------+ ECA       96      11                                              +----------+--------+--------+--------+-----------------------+--------+ +----------+--------+-------+----------------+-------------------+           PSV cm/sEDV cmsDescribe        Arm Pressure (mmHG) +----------+--------+-------+----------------+-------------------+ Subclavian150            Multiphasic, WNL                    +----------+--------+-------+----------------+-------------------+ +---------+--------+--+--------+--+---------+ VertebralPSV cm/s68EDV cm/s23Antegrade +---------+--------+--+--------+--+---------+  Left Carotid Findings: +----------+--------+--------+--------+-----------------------+--------+           PSV cm/sEDV cm/sStenosisPlaque Description     Comments +----------+--------+--------+--------+-----------------------+--------+ CCA Prox  123     23                                              +----------+--------+--------+--------+-----------------------+--------+ CCA Distal91      28                                               +----------+--------+--------+--------+-----------------------+--------+  ICA Prox  60      18              smooth and heterogenous         +----------+--------+--------+--------+-----------------------+--------+ ICA Distal80      29                                              +----------+--------+--------+--------+-----------------------+--------+ ECA       81      14                                              +----------+--------+--------+--------+-----------------------+--------+ +----------+--------+--------+----------------+-------------------+           PSV cm/sEDV cm/sDescribe        Arm Pressure (mmHG) +----------+--------+--------+----------------+-------------------+ [id number]              Multiphasic, WNL                    +----------+--------+--------+----------------+-------------------+ +---------+--------+--+--------+-+---------+ VertebralPSV cm/s50EDV cm/s8Antegrade +---------+--------+--+--------+-+---------+   Summary: Right Carotid: Velocities in the right ICA are consistent with a 1-39% stenosis. Left Carotid: Velocities in the left ICA are consistent with a 1-39% stenosis. Vertebrals:  Bilateral vertebral arteries demonstrate antegrade flow. Subclavians: Normal flow hemodynamics were seen in bilateral subclavian              arteries. *See table(s) above for measurements and observations.  Electronically signed by Delia Heady MD on 12/15/2019 at 9:11:06 AM.    Final     Labs:  Basic Metabolic Panel: Recent Labs  Lab 01/04/20 0632  NA 139  K 3.7  CL 107  CO2 21*  GLUCOSE 70  BUN 21*  CREATININE 0.60  CALCIUM 9.4    CBC: Recent Labs  Lab 01/04/20 0632  WBC 7.8  HGB 11.5*  HCT 36.5  MCV 100.0  PLT 332    CBG: No results for input(s): GLUCAP in the last 168 hours.  Family history positive for diabetes.  Denies any colon cancer esophageal cancer or rectal cancer  Brief HPI:   Melissa Colon is a 59 y.o.  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  Per chart review lives with spouse son brother-in-law and sister-in-law.  Reportedly independent prior to admission.  Presented 12/10/2019 with right side weakness and mild aphasia.  Cranial CT scan showed acute intraparenchymal hemorrhage in the left thalamus measuring 2.5 x 1.3 x 2.1 cm.  Mild surrounding edema.  Old bilateral occipital insults with dystrophic calcification.  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  MRA of the head high-grade proximal left P2 segment stenosis.  No other significant proximal stenosis aneurysm or branch vessel occlusion.  Follow-up MRI showed a stable 2.4 cm acute hemorrhage left thalamus with edema extending into the left cerebellar peduncle and posterior limb left internal capsule.  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  Admission chemistries alcohol negative sodium 131 hemoglobin 11.6 urine drug screen negative.  Echocardiogram with ejection fraction of 60 to 65% no wall motion abnormalities grade 1 diastolic dysfunction.  Neurology follow-up close monitoring of blood pressure.  Patient was cleared to begin subcutaneous heparin for DVT prophylaxis.  Tolerating a regular diet.  Due to patient's  acute right side weakness and aphasia she was admitted to inpatient rehab services for comprehensive rehab therapies.   Hospital Course: Dore Oquin was admitted to rehab 12/14/2019 for inpatient therapies to consist of PT, ST and OT at least three hours five days a week. Past admission physiatrist, therapy team and rehab RN have worked together to provide customized collaborative inpatient rehab.  Pertaining to patient's left thalamic hemorrhage as well as history of right cerebellar brain tumor with excision craniotomy teenager remained stable she would follow-up neurology services.  She had been cleared for subcutaneous heparin for DVT  prophylaxis no bleeding episodes.  Blood pressure controlled on Norvasc and would need follow-up outpatient.  History of tobacco abuse NicoDerm patch receiving counsel guard cessation of nicotine products as well is noted history of alcohol use and again receiving counseling.  Bouts of urinary retention PVRs improved completed course of amoxicillin for Enterococcus UTI.   Blood pressures were monitored on TID basis and controlled     Rehab course: During patient's stay in rehab weekly team conferences were held to monitor patient's progress, set goals and discuss barriers to discharge. At admission, patient required moderate assist sit to stand moderate assist ambulate 5 feet to person hand-held assist.  Moderate assist upper body bathing mod assist lower body bathing mod assist upper body dressing mod assist lower body dressing  Physical exam.  Blood pressure 118/76 pulse 104 temperature 98.2 respirations 18 oxygen saturation 96% room air General.  Alert oriented no apparent distress HEENT.  Head is normocephalic left gaze preference Eyes.  Pupils round and reactive to light no discharge without nystagmus Neck.  Supple nontender no JVD without thyromegaly Cardiac regular rate rhythm without extra sounds or murmur heard Abdomen.  Soft nontender positive bowel sounds not rebound Respiratory effort normal no respiratory distress without wheeze Extremities.  No clubbing cyanosis or edema Neurologic.  Alert oriented right facial droop Right upper extremity 3/4 SA, EE, EF, 4 -/5 handgrip and wrist extension Right lower extremity 3/5 hip flexors knee extension 4/5 dorsi plantar flexion  /She  has had improvement in activity tolerance, balance, postural control as well as ability to compensate for deficits. Francis Dowse has had improvement in functional use RUE/LUE  and RLE/LLE as well as improvement in awareness.  Supine to sit with minimal assist.  Sitting balance edge of bed supervision assist to don  shoes.  Lateral scoot squat pivot transfers to the left with minimal assist.  Ambulates 30 feet rolling walker minimal assist.  Stand pivot transfers back to wheelchair with minimal assist.  Patient completes dressing at sink with set up to don and doff pullover shirt.  Speech therapy follow-up for aphasia discussed compensatory strategies for word finding ways the patient family can facilitate independence with functional communication.  Full family teaching completed plan discharge to home       Disposition: Discharged to home    Diet: Soft  Special Instructions: No driving smoking or alcohol  Medications at discharge 1.  Tylenol as needed 2.  Norvasc 2.5 mg daily 3.  Lipitor 40 mg daily 4.  Multivitamin daily 5.  NicoDerm patch taper as directed 6.  Protonix 40 mg daily 7.  MiraLAX daily hold for loose stools 8.  Topamax 25 mg nightly  30-35 minutes were spent completing discharge summary and discharge planning  Discharge Instructions    Ambulatory referral to Neurology   Complete by: As directed    An appointment is requested in approximately follow-up 4 weeks left thalamic  hemorrhage   Ambulatory referral to Physical Medicine Rehab   Complete by: As directed    Moderate complexity follow-up 2 to 3 weeks left thalamic hemorrhage       Follow-up Information    Raulkar, Drema Pry, MD Follow up.   Specialty: Physical Medicine and Rehabilitation Why: Office to call for appointment Contact information: [address] [place] [address] [phone]               Signed: Charlton Amor 01/05/2020, 5:28 AM

## 2020-01-03 NOTE — Progress Notes (Signed)
Occupational Therapy Session Note  Patient Details  Name: Melissa Colon MRN: [id number] Date of Birth: November 25, 1960  Today's Date: 01/03/2020 OT Individual Time: 0900-1000 OT Individual Time Calculation (min): 60 min    Short Term Goals: Week 3:  OT Short Term Goal 1 (Week 3): Pt will don LB clothing with min A OT Short Term Goal 2 (Week 3): Pt will complete toilet or BSC transfer with min A OT Short Term Goal 3 (Week 3): Pt will incorporate her RUE into bathing tasks with no more than min cueing  Skilled Therapeutic Interventions/Progress Updates:    Patient in bed, alert and ready for therapy session.  She states that she is not sure when her family is coming in today, but thinks that it is later this afternoon.  She denies pain.  Supine to sitting edge of bed with min a.  Sit pivot transfer to/from bed, w/c, mat table in therapy gym with mod A, cues for weight shift and input through right side for facilitation.  Completed oral care with min A, UB bathing and dressing with mod a.  She declined lower body bathing and dressing at this time.   Completed trunk control/mobility, proximal stability, weight bearing, inhibition of flexors and motor control activities in short sit position on mat table with improved control after repetitions (note fair carryover with similar activities).   She returned to bed at close of session, mod A for sit to supine, bed alarm set and call bell in hand.    Therapy Documentation Precautions:  Precautions Precautions: Fall Precaution Comments: R hemiparesis and incoordination, pushing to R in stand Restrictions Weight Bearing Restrictions: No   Therapy/Group: Individual Therapy  Barrie Lyme 01/03/2020, 7:35 AM

## 2020-01-04 ENCOUNTER — Other Ambulatory Visit (HOSPITAL_COMMUNITY): Payer: Self-pay | Admitting: Physician Assistant

## 2020-01-04 ENCOUNTER — Ambulatory Visit (HOSPITAL_COMMUNITY): Payer: Self-pay

## 2020-01-04 ENCOUNTER — Encounter (HOSPITAL_COMMUNITY): Payer: Self-pay

## 2020-01-04 ENCOUNTER — Encounter (HOSPITAL_COMMUNITY): Payer: Self-pay | Admitting: Speech Pathology

## 2020-01-04 LAB — CBC
HCT: 36.5 % (ref 36.0–46.0)
Hemoglobin: 11.5 g/dL — ABNORMAL LOW (ref 12.0–15.0)
MCH: 31.5 pg (ref 26.0–34.0)
MCHC: 31.5 g/dL (ref 30.0–36.0)
MCV: 100 fL (ref 80.0–100.0)
Platelets: 332 10*3/uL (ref 150–400)
RBC: 3.65 MIL/uL — ABNORMAL LOW (ref 3.87–5.11)
RDW: 14 % (ref 11.5–15.5)
WBC: 7.8 10*3/uL (ref 4.0–10.5)
nRBC: 0 % (ref 0.0–0.2)

## 2020-01-04 LAB — BASIC METABOLIC PANEL
Anion gap: 11 (ref 5–15)
BUN: 21 mg/dL — ABNORMAL HIGH (ref 6–20)
CO2: 21 mmol/L — ABNORMAL LOW (ref 22–32)
Calcium: 9.4 mg/dL (ref 8.9–10.3)
Chloride: 107 mmol/L (ref 98–111)
Creatinine, Ser: 0.6 mg/dL (ref 0.44–1.00)
GFR, Estimated: >60 mL/min (ref >60)
Glucose, Bld: 70 mg/dL (ref 70–99)
Potassium: 3.7 mmol/L (ref 3.5–5.1)
Sodium: 139 mmol/L (ref 135–145)

## 2020-01-04 MED ORDER — NICOTINE 14 MG/24HR TD PT24: MEDICATED_PATCH | TRANSDERMAL | 0 refills | Status: DC

## 2020-01-04 MED ORDER — ATORVASTATIN CALCIUM 40 MG PO TABS: 40.0000 mg | ORAL_TABLET | Freq: Every day | ORAL | 11 refills | Status: DC

## 2020-01-04 MED ORDER — TOPIRAMATE 25 MG PO TABS: 25.0000 mg | ORAL_TABLET | Freq: Every day | ORAL | 0 refills | Status: DC

## 2020-01-04 MED ORDER — BENZOCAINE 10 % MT GEL: Freq: Three times a day (TID) | OROMUCOSAL | 0 refills | Status: DC

## 2020-01-04 MED ORDER — AMLODIPINE BESYLATE 2.5 MG PO TABS: 2.5000 mg | ORAL_TABLET | Freq: Every day | ORAL | 0 refills | Status: DC

## 2020-01-04 MED ORDER — PANTOPRAZOLE SODIUM 40 MG PO TBEC: 40.0000 mg | DELAYED_RELEASE_TABLET | Freq: Every day | ORAL | 0 refills | Status: DC

## 2020-01-04 MED ORDER — POLYETHYLENE GLYCOL 3350 17 G PO PACK: 17.0000 g | PACK | Freq: Every day | ORAL | 0 refills | Status: DC

## 2020-01-04 MED ORDER — FOLIC ACID 1 MG PO TABS: 1.0000 mg | ORAL_TABLET | Freq: Every day | ORAL | 0 refills | Status: DC

## 2020-01-04 MED FILL — ATORVASTATIN CALCIUM 40 MG: 40 | 30 days supply | Qty: 30 | Fill #0

## 2020-01-04 MED FILL — FOLIC ACID 1 MG TABS: 1 | 30 days supply | Qty: 30 | Fill #0

## 2020-01-04 MED FILL — AMLODIPINE BESYLATE 2.5 MG: 2.5 | 30 days supply | Qty: 30 | Fill #0

## 2020-01-04 MED FILL — TOPIRAMATE 25 MG TABLET: 25 | 30 days supply | Qty: 30 | Fill #0

## 2020-01-04 MED FILL — PANTOPRAZOLE SOD DR 40 MG T: 40 | 30 days supply | Qty: 30 | Fill #0

## 2020-01-04 NOTE — Progress Notes (Signed)
Speech Language Pathology Discharge Summary  Patient Details  Name: Melissa Colon MRN: [id number] Date of Birth: February 28, 1960  Today's Date: 01/04/2020 SLP Individual Time: 1105-1200 SLP Individual Time Calculation (min): 55 min   Skilled Therapeutic Interventions:  Skilled treatment session focused on cognitive goals. SLP facilitated session by providing Mod verbal cues for recall of her current medications and their functions. However, patient organized a BID pill box with supervision verbal cues needed to self-monitor and correct errors. Patient demonstrated sustained attention to task for ~30 minutes with Mod I. Patient transferred back to bed at end of session via the [facility]. Patient left upright in bed with alarm on and all needs within reach. Continue with current plan of care.    Patient has met 3 of 5 long term goals.  Patient to discharge at [facility] Rehabilitation level.   Reasons goals not met: Patient continues to require Min A verbal cues for functional problem solving and recall with bsaic and familiar tasks   Clinical Impression/Discharge Summary: Patient has made functional gains and has met 3 of 5 LTGs this admission. Currently, patient is overall Mod I for verbal expression of wants/needs in regards to utilization of speech intelligibility and word-finding strategies. However, patient continues to require overall Min A verbal cues for functional problem solving and recall with functional and familiar tasks. Patient and family education is complete and patient will discharge home with 24 hour supervision. Patient would benefit from f/u skilled SLP services to maximize her cognitive functioning and overall functional independence in order to reduce caregiver burden.   Care Partner:  Caregiver Able to Provide Assistance: Yes  Type of Caregiver Assistance: Physical;Cognitive  Recommendation:  Home Health SLP;24 hour supervision/assistance  Rationale for SLP Follow Up: Maximize functional  communication;Reduce caregiver burden;Maximize cognitive function and independence   Equipment: N/A   Reasons for discharge: Discharged from hospital   Patient/Family Agrees with Progress Made and Goals Achieved: Yes    Alice, [place] 01/04/2020, 6:45 AM

## 2020-01-04 NOTE — Progress Notes (Signed)
[place] PHYSICAL MEDICINE & REHABILITATION PROGRESS NOTE   Subjective/Complaints: Upset that labs were done this morning. Asked why? Told her the last time we checked was over 2 weeks ago!  ROS: Patient denies fever, rash, sore throat, blurred vision, nausea, vomiting, diarrhea, cough, shortness of breath or chest pain, joint or back pain, headache, or mood change.    Objective:   No results found. Recent Labs    01/04/20 0632  WBC 7.8  HGB 11.5*  HCT 36.5  PLT 332   Recent Labs    01/04/20 0632  NA 139  K 3.7  CL 107  CO2 21*  GLUCOSE 70  BUN 21*  CREATININE 0.60  CALCIUM 9.4    Intake/Output Summary (Last 24 hours) at 01/04/2020 1009 Last data filed at 01/04/2020 0914 Gross per 24 hour  Intake 440 ml  Output --  Net 440 ml    Physical Exam: Vital Signs Blood pressure 133/75, pulse 90, temperature 98 F (36.7 C), temperature source Oral, resp. rate 16, height 5\' 2"  (1.575 m), weight 46.7 kg, SpO2 100 %.  Constitutional: No distress . Vital signs reviewed. HEENT: EOMI, oral membranes moist Neck: supple Cardiovascular: RRR without murmur. No JVD    Respiratory/Chest: CTA Bilaterally without wheezes or rales. Normal effort    GI/Abdomen: BS +, non-tender, non-distended Ext: no clubbing, cyanosis, or edema Psych: pleasant and cooperative Neuro:  right central 7, mild dysarthria.  Dysconjugate gaze still present. delays in processing and language output at times.  Provides name and age.  alert and aware. Right sided apraxia. RUE 3 to 3+ SA, EE, EF, 4-/5 hand grip and WE RLE 3+/5 HF, KE, 4/5 DF, PF. Motor exam stable 12/20   Right side withongoing deficits in LT,PP which lead to decreased coordination    Assessment/Plan: 1. Functional deficits which require 3+ hours per day of interdisciplinary therapy in a comprehensive inpatient rehab setting.  Physiatrist is providing close team supervision and 24 hour management of active medical problems listed  below.  Physiatrist and rehab team continue to assess barriers to discharge/monitor patient progress toward functional and medical goals  Care Tool:  Bathing    Body parts bathed by patient: Right arm,Left arm,Chest,Abdomen,Front perineal area,Right upper leg,Left upper leg,Face,Right lower leg,Left lower leg   Body parts bathed by helper: Buttocks     Bathing assist Assist Level: Minimal Assistance - Patient > 75%     Upper Body Dressing/Undressing Upper body dressing   What is the patient wearing?: Pull over shirt    Upper body assist Assist Level: Supervision/Verbal cueing    Lower Body Dressing/Undressing Lower body dressing      What is the patient wearing?: Incontinence brief     Lower body assist Assist for lower body dressing: Moderate Assistance - Patient 50 - 74%     Toileting Toileting    Toileting assist Assist for toileting: Maximal Assistance - Patient 25 - 49%     Transfers Chair/bed transfer  Transfers assist     Chair/bed transfer assist level: Moderate Assistance - Patient 50 - 74% (no device)     Locomotion Ambulation   Ambulation assist      Assist level: Moderate Assistance - Patient 50 - 74% Assistive device: Parallel bars Max distance: 8   Walk 10 feet activity   Assist  Walk 10 feet activity did not occur: Safety/medical concerns (R hemi, R pusher, decreased motor planning, R inattention)  Assist level: Moderate Assistance - Patient - 50 - 74%  Assistive device: Walker-rolling   Walk 50 feet activity   Assist Walk 50 feet with 2 turns activity did not occur: Safety/medical concerns         Walk 150 feet activity   Assist Walk 150 feet activity did not occur: Safety/medical concerns         Walk 10 feet on uneven surface  activity   Assist Walk 10 feet on uneven surfaces activity did not occur: Safety/medical concerns         Wheelchair     Assist Will patient use wheelchair at discharge?:  Yes Type of Wheelchair: Manual    Wheelchair assist level: Supervision/Verbal cueing Max wheelchair distance: 25 ft    Wheelchair 50 feet with 2 turns activity    Assist        Assist Level: Supervision/Verbal cueing   Wheelchair 150 feet activity     Assist      Assist Level: Supervision/Verbal cueing   Blood pressure 133/75, pulse 90, temperature 98 F (36.7 C), temperature source Oral, resp. rate 16, height 5\' 2"  (1.575 m), weight 46.7 kg, SpO2 100 %.  Medical Problem List and Plan: 1.  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 Finalize dc planning   2.  Antithrombotics: -DVT/anticoagulation: Subcutaneous heparin             -antiplatelet therapy: N/A 3. Pain Management: Tylenol as needed- for occasional left sided headache  -dental caries   -chlorhexidine rinse--change to PRN   - Will need outpt dentistry f/u at some point (doesn't have dentist)  12/20 headaches (and sleep) improved with low dose topiramate at HS--     4. Mood: Provide emotional support             -antipsychotic agents: N/A  - prn trazodone to assist with sleep 5. Neuropsych: This patient is capable of making decisions on her own behalf. 6. Skin/Wound Care: Routine skin checks 7. Fluids/Electrolytes/Nutrition: good po  -I personally reviewed the patient's labs today.   8.  Hypertension.  Norvasc 5 mg daily.    12/11 bp low, decreased Norvasc to 2.5mg .   12/17: excellent control, continue norvasc 2.5mg   12/20- BP great control- con't regimen 9.  Tobacco abuse.  NicoDerm patch.  Provide counseling 10.  History of alcohol use.  Provide counseling. Continue folic acid supplement 11. Tachycardic to 103 on admission-      resolved  12/20: HR incontrolled  currently  12. Urinary retention: pvr's generally less than 200, urine still cloudy  12/2-ucx with 100k aerococcus urinae.    -amoxil 12/2-12/4 completed 13. Acute blood loss anemia: Monitor Hgb weekly. 11.5 on 12/20 14. Multiple BM per day: stopped senna-docusate--formed stool 12/3  12/8 -resumed senna-s, miralax  12/18- LBM yesterday con't regimen  Moving bowels regularly now bm 12/17     LOS: 21 days A FACE TO FACE EVALUATION WAS PERFORMED  1/18 01/04/2020, 10:09 AM

## 2020-01-04 NOTE — Progress Notes (Signed)
Occupational Therapy Discharge Summary  Patient Details  Name: Melissa Colon MRN: [id number] Date of Birth: 05/14/1960  Today's Date: 01/04/2020 OT Individual Time: 1300-1400 OT Individual Time Calculation (min): 60 min    Patient has met 8 of 10 long term goals due to improved activity tolerance, improved balance, postural control, ability to compensate for deficits, functional use of  RIGHT upper and RIGHT lower extremity, improved attention, improved awareness and improved coordination.  Patient to discharge at [facility] Assist level.  Patient's care partner is independent to provide the necessary physical and cognitive assistance at discharge.    Reasons goals not met: Pt requires mod A for LB dressing d/t balance deficits. Pt also has not met her cognition goal, still requiring min A for anticipatory awareness.   Recommendation:  Patient will benefit from ongoing skilled OT services in home health setting to continue to advance functional skills in the area of BADL. This will likely not happen 2/2 lack of insurance so pt was provided with a HEP and family educated.   Equipment: BSC  Reasons for discharge: treatment goals met and discharge from hospital  Patient/family agrees with progress made and goals achieved: Yes   Skilled OT Intervention: Session focused on family education with pt's niece and brother present. Pt agreeable to ADLs with much encouragement from family. UB dressing simulated and edu re hemi technique provided. LB dressing completed with mod A EOB. Discussed incontinence and need to take pt to the bathroom every 2 hours to attempt void. Gave detailed demo re RUE use and HEP. Pt stood from EOB with min A. Pt more lethargic than normal this session, stating it was because she wasn't sleeping. Pt was left supine with all needs met.    HEP provided:  Access Code: [email or web address]: [email or web address]: 12/20/2021Prepared by: Katharine Look DavisExercises   Seated Elbow Flexion and Extension AROM - 1 x daily - 7 x weekly - 3 sets - 10 reps  Wrist Extension AROM - 1 x daily - 7 x weekly - 3 sets - 10 reps  Wrist Flexion AROM - 1 x daily - 7 x weekly - 3 sets - 10 reps  Thumb Opposition - 1 x daily - 7 x weekly - 3 sets - 10 reps  Seated Forearm Pronation and Supination AROM - 1 x daily - 7 x weekly - 3 sets - 10 reps  Seated Shoulder Flexion Towel Slide at Table Top - 1 x daily - 7 x weekly - 3 sets - 10 reps  Seated Shoulder Abduction Towel Slide at Table Top - 1 x daily - 7 x weekly - 3 sets - 10 reps  Seated Shoulder External Rotation PROM on Table - 1 x daily - 7 x weekly - 3 sets - 10 reps   OT Discharge Precautions/Restrictions  Precautions Precautions: Fall Precaution Comments: R hemi Restrictions Weight Bearing Restrictions: No  Pain Assessment Pain Scale: 0-10 Pain Score: 0-No pain ADL ADL Eating: Minimal cueing,Set up Where Assessed-Eating: Bed level Grooming: Setup Where Assessed-Grooming: Sitting at sink Upper Body Bathing: Minimal cueing,Supervision/safety Where Assessed-Upper Body Bathing: Edge of bed Lower Body Bathing: Minimal assistance Where Assessed-Lower Body Bathing: Edge of bed Upper Body Dressing: Supervision/safety Where Assessed-Upper Body Dressing: Edge of bed Lower Body Dressing: Minimal assistance,Minimal cueing Where Assessed-Lower Body Dressing: Edge of bed Toileting: Minimal cueing,Moderate assistance Where Assessed-Toileting: Bedside Commode Toilet Transfer: Moderate assistance Toilet Transfer Method: Stand pivot Toilet Transfer Equipment: Drop arm bedside commode Vision Baseline Vision/History: Wears glasses Wears Glasses: At  all times Patient Visual Report: Diplopia Vision Assessment?: Yes Eye Alignment: Within Functional Limits Ocular Range of Motion: Restricted on the right Alignment/Gaze Preference: Head turned Tracking/Visual Pursuits: Decreased smoothness of horizontal  tracking;Decreased smoothness of vertical tracking Saccades: Decreased speed of saccadic movement Convergence: Within functional limits Visual Fields: No apparent deficits Depth Perception: Overshoots Perception  Perception: Impaired Inattention/Neglect: Does not attend to right visual field;Does not attend to right side of body Praxis Praxis: Impaired Praxis Impairment Details: Motor planning Cognition Overall Cognitive Status: Impaired/Different from baseline Arousal/Alertness: Awake/alert Orientation Level: Oriented X4 Attention: Sustained Focused Attention: Appears intact Sustained Attention: Appears intact Memory: Impaired Memory Impairment: Decreased short term memory Decreased Short Term Memory: Verbal basic;Functional basic Problem Solving Impairment: Functional complex Safety/Judgment: Appears intact Sensation Sensation Light Touch: Impaired Detail Light Touch Impaired Details: Absent RLE;Absent RUE Proprioception: Impaired by gross assessment;Impaired Detail Proprioception Impaired Details: Absent RLE;Absent RUE Coordination Gross Motor Movements are Fluid and Coordinated: No Fine Motor Movements are Fluid and Coordinated: No Coordination and Movement Description: R hemi with R inattention, as well as ataxia Motor  Motor Motor: Hemiplegia;Abnormal postural alignment and control;Abnormal tone;Ataxia Motor - Discharge Observations: R hemi with R inattention and poor motor planning, has poor recall of new information limiting carryover between sessions, improves with repetition using blocked practice Mobility  Bed Mobility Bed Mobility: Rolling Right;Rolling Left;Supine to Sit;Sit to Supine Rolling Right: Supervision/verbal cueing Rolling Left: Supervision/Verbal cueing Supine to Sit: Supervision/Verbal cueing Sit to Supine: Supervision/Verbal cueing Transfers Sit to Stand: Minimal Assistance - Patient > 75% Stand to Sit: Minimal Assistance - Patient > 75%   Trunk/Postural Assessment  Cervical Assessment Cervical Assessment: Exceptions to [facility] (forward head) Thoracic Assessment Thoracic Assessment: Exceptions to [facility] (rounded shoulders) Lumbar Assessment Lumbar Assessment: Exceptions to [facility] (posterior pelvic tilt) Postural Control Postural Control: Deficits on evaluation Righting Reactions: delayed  Balance Balance Balance Assessed: Yes Static Sitting Balance Static Sitting - Balance Support: No upper extremity supported;Feet supported Static Sitting - Level of Assistance: 5: Stand by assistance Dynamic Sitting Balance Dynamic Sitting - Balance Support: No upper extremity supported;Feet supported Dynamic Sitting - Level of Assistance: 5: Stand by assistance Static Standing Balance Static Standing - Balance Support: Bilateral upper extremity supported;During functional activity Static Standing - Level of Assistance: 4: Min assist;5: Stand by assistance Dynamic Standing Balance Dynamic Standing - Balance Support: Right upper extremity supported;Left upper extremity supported;During functional activity Dynamic Standing - Level of Assistance: 3: Mod assist Extremity/Trunk Assessment RUE Assessment RUE Assessment: Exceptions to [facility] General Strength Comments: Ataxic movements with diminished sensation RUE Body System: Neuro Brunstrum levels for arm and hand: Arm;Hand Brunstrum level for arm: Stage IV Movement is deviating from synergy Brunstrum level for hand: Stage V Independence from basic synergies LUE Assessment LUE Assessment: Within Functional Limits   Curtis Sites 01/04/2020, 3:12 PM

## 2020-01-04 NOTE — Progress Notes (Signed)
Patient ID: Melissa Colon, female   DOB: [birth year], 59 y.o.   MRN: [id number]  SW met with pt, pt brother Melissa Colon niece while pt was in therapy. SW provided letter for disability. SW reviewed charity DME for RW and DABSC. SW explained SW will follow-up with [employer] tomorrow as new agency begins on date of d/c. Brother requests to speak with a physician with regard to pt resided hemiparesis.   SW provided pt brother with letter for disability, and SSA kit. SW did inform pt brother a representative from [facility] will follow-up.  SW ordered DME with [employer].   Loralee Pacas, MSW, [place] Office: [phone] Cell: [phone] Fax: [phone]

## 2020-01-04 NOTE — Progress Notes (Signed)
Physical Therapy Discharge Summary  Patient Details  Name: Melissa Colon MRN: [id number] Date of Birth: 28-Sep-1960  Today's Date: 01/04/2020 PT Individual Time: 1000-1100 PT Individual Time Calculation (min): 60 min    Patient has met 10 of 12 long term goals due to improved activity tolerance, improved balance, improved postural control, increased strength, ability to compensate for deficits and improved awareness.  Patient to discharge at a wheelchair level Supervision for wheelchair mobility and min-mod A for transfers. Patient's care partner is independent to provide the necessary physical and cognitive assistance at discharge.  Reasons goals not met: Patient unable to meet 50 ft distance for gait goal. Patient is not presently a functional ambulator and will being using a w/c for primary mobility at d/c. Patient and family state understanding and report that this can be accommodated in the home at d/c. Patient requires min A for anticipatory awareness, family demonstrated ability to provide this level of assist.   Recommendation:  Patient will benefit from ongoing skilled PT services in home health setting to continue to advance safe functional mobility, address ongoing impairments in R UE/LE strength/NMR, motor planning, functional mobility, memory, activity tolerance, balance, patient/caregiver education, and minimize fall risk.  Equipment: Ultralight manual w/c with 1/2 lap tray, RW with R hand splint  Reasons for discharge: treatment goals met  Patient/family agrees with progress made and goals achieved: Yes  Skilled Therapeutic Intervention: Patient in bed with her brother-in-law and niece in the room, present for family education, upon PT arrival. Patient alert and agreeable to PT session. Patient denied pain during session. Patient reported poor sleep last night and very quiet initially this morning.   Therapeutic Activity: Bed Mobility: Patient performed rolling R/L and  supine to/from sit with supervision in a flat bed without use of bed rails. Elevated bed to match patient's bed height at home, per measurements provided by patient's family during session.  Provided verbal cues for sequencing and rolling technique to push up to sitting. Patient's niece donned B sock, R air cast, and shoes without cues with patient sitting EOB with supervision for sitting balance.  Transfers: Patient performed sit to/from stand x4 with min A using RW with R hand splint, demonstrated increased R lean and poor balance on first trial, able to return to sitting and reposition with improvement on subsequent trials. Provided verbal cues for scooting forward, hand and foot placement, forward and L weight shift. Patient stood >1 min with min A to demonstrate pressure relief using standing every 30 min when sitting to maintain skin integrity. Provided cues for L weight shift toward visual target to maintain midline orientation with fatigue. Patient's family performed sit to stand transfers with the patient and able to teach back pressure relief schedule during session.   Gait Training:  Patient ambulated 25 feet using RW with R hand splint and w/c follow for safety with mod A, limited by fatigue today. Ambulated as described below.  Wheelchair Mobility:  Patient was transported in the w/c with total A throughout session for energy conservation and time management. Patient's family cued patient for management of breaks and leg rests and demonstrated their ability to assist with w/c parts management throughout session.  Demonstrated technique for bumping patient up the steps with 2 person assist for home entry. Patient's brother-in-law and niece bumped the patient 3+2 steps with cues for safe technique. Able to teach back technique and sequencing verbally after. Encouraged patient's family to call emergency services for assistance if safety concerns arise prior  to or during home entry to maintain  patient and families safety.   Educated on d/c process, fall risk/prevention, home modifications to prevent falls, and activation of emergency services in the event of a fall. Patient's family demonstrated safe technique with all mobility and denies any further questions or concerns at this time.   Patient in w/c handed off to OT for continued family training at end of session.  PT Discharge Precautions/Restrictions Precautions Precautions: Fall Precaution Comments: R hemi Restrictions Weight Bearing Restrictions: No Vision/Perception  Vision - Assessment Eye Alignment: Within Functional Limits Ocular Range of Motion: Within Functional Limits Alignment/Gaze Preference: Other (comment) (discongugate gaze) Tracking/Visual Pursuits: Decreased smoothness of horizontal tracking;Decreased smoothness of vertical tracking;Decreased smoothness of eye movement to RIGHT superior field;Decreased smoothness of eye movement to LEFT superior field;Decreased smoothness of eye movement to RIGHT inferior field;Decreased smoothness of eye movement to LEFT inferior field Saccades: Additional eye shifts occurred during testing;Decreased speed of saccadic movement Convergence: Within functional limits Diplopia Assessment: Objects split side to side Perception Perception: Impaired Inattention/Neglect: Does not attend to right side of body;Does not attend to right visual field Praxis Praxis: Impaired Praxis Impairment Details: Motor planning  Cognition Overall Cognitive Status: Impaired/Different from baseline Arousal/Alertness: Awake/alert Orientation Level: Oriented X4 Attention: Sustained Focused Attention: Appears intact Sustained Attention: Appears intact Memory: Impaired Memory Impairment: Decreased short term memory Decreased Short Term Memory: Verbal basic;Functional basic Problem Solving Impairment: Functional complex Safety/Judgment: Appears intact Sensation Sensation Light Touch:  Impaired Detail Light Touch Impaired Details: Absent RLE;Absent RUE (intermittent tingling on R side per patient report, but not sesnitive to light touch) Proprioception: Impaired by gross assessment;Impaired Detail Proprioception Impaired Details: Absent RLE;Absent RUE Coordination Gross Motor Movements are Fluid and Coordinated: No Fine Motor Movements are Fluid and Coordinated: No Coordination and Movement Description: R hemi with R inattention, as well as ataxia Motor  Motor Motor: Hemiplegia;Abnormal postural alignment and control;Abnormal tone;Ataxia Motor - Discharge Observations: R hemi with R inattention and poor motor planning, has poor recall of new information limiting carryover between sessions, improves with repetition using blocked practice  Mobility Bed Mobility Bed Mobility: Rolling Right;Rolling Left;Supine to Sit;Sit to Supine Rolling Right: Supervision/verbal cueing Rolling Left: Supervision/Verbal cueing Supine to Sit: Supervision/Verbal cueing Sit to Supine: Supervision/Verbal cueing Transfers Transfers: Sit to Stand;Stand to Sit;Stand Pivot Transfers;Squat Pivot Transfers Sit to Stand: Minimal Assistance - Patient > 75% Stand to Sit: Minimal Assistance - Patient > 75% Stand Pivot Transfers: Moderate Assistance - Patient 50 - 74% Stand Pivot Transfer Details: Verbal cues for technique;Verbal cues for precautions/safety;Verbal cues for safe use of DME/AE;Manual facilitation for weight shifting;Manual facilitation for placement;Manual facilitation for weight bearing Squat Pivot Transfers: Minimal Assistance - Patient > 75% Transfer (Assistive device): Rolling walker (with R hand splint) Locomotion  Gait Ambulation: Yes Gait Assistance: Moderate Assistance - Patient 50-74% Gait Distance (Feet): 30 Feet Assistive device: Rolling walker (with R hand splint) Gait Assistance Details: manual facilitation for weight shift, visual target for L weight shift and reduced R  lean/pushing, verbal cues for sequencing, tactile cues for R hamstring activation for increased foot clearance, manual facilitation for AD managment Gait Gait: Yes Gait Pattern: Step-to pattern;Step-through pattern;Decreased stance time - right;Decreased hip/knee flexion - right;Decreased dorsiflexion - right;Decreased weight shift to left;Right foot flat;Right flexed knee in stance;Lateral trunk lean to right;Decreased trunk rotation;Trunk rotated posteriorly on right;Poor foot clearance - right Gait velocity: decreased Stairs / Additional Locomotion Stairs: Yes (2-3" steps with L rail) Stairs Assistance: Maximal Assistance - Patient 25 -  49% Stair Management Technique: One rail Left Number of Stairs: 2 Height of Stairs: 3 Wheelchair Mobility Wheelchair Mobility: Yes Wheelchair Assistance: Supervision/Verbal cueing Wheelchair Propulsion: Left upper extremity;Left lower extremity Wheelchair Parts Management: Supervision/cueing Distance: >150 ft  Trunk/Postural Assessment  Cervical Assessment Cervical Assessment: Exceptions to [facility] (forward head) Thoracic Assessment Thoracic Assessment: Exceptions to [facility] (rounded shoulders, kyphotic posture) Lumbar Assessment Lumbar Assessment: Exceptions to [facility] (posterior pelvic tilt) Postural Control Postural Control: Deficits on evaluation Righting Reactions: delayed  Balance Balance Balance Assessed: Yes Static Sitting Balance Static Sitting - Balance Support: No upper extremity supported;Feet supported Static Sitting - Level of Assistance: 5: Stand by assistance Dynamic Sitting Balance Dynamic Sitting - Balance Support: No upper extremity supported;Feet supported Dynamic Sitting - Level of Assistance: 5: Stand by assistance Static Standing Balance Static Standing - Balance Support: Bilateral upper extremity supported;During functional activity Static Standing - Level of Assistance: 4: Min assist;5: Stand by assistance Static Standing -  Comment/# of Minutes: Can progress to supervision with cues for R trunk and lower extremity extension and reduced R lean using visual target on the L to cue patient to weight shift L Dynamic Standing Balance Dynamic Standing - Balance Support: Right upper extremity supported;Left upper extremity supported;During functional activity Dynamic Standing - Level of Assistance: 3: Mod assist Extremity Assessment  RLE Assessment RLE Assessment: Exceptions to [facility] Active Range of Motion (AROM) Comments: WFL for functional mobility General Strength Comments: Grossly in sitting RLE Strength Right Hip Flexion: 3-/5 Right Knee Flexion: 4+/5 Right Knee Extension: 4+/5 Right Ankle Dorsiflexion: 4/5 Right Ankle Plantar Flexion: 4+/5 RLE Tone RLE Tone: Mild;Hypertonic;Modified Ashworth Body Part - Modified Ashworth Scale: Gastrocnemius;Soleus GASTROCNEMIUS - Modified Ashworth Scale for Grading Hypertonia RLE: Slight increase in muscle tone, manifested by a catch and release or by minimal resistance at the end of the range of motion when the affected part(s) is moved in flexion or extension SOLEUS - Modified Ashworth Scale for Grading Hypertonia RLE: Slight increase in muscle tone, manifested by a catch and release or by minimal resistance at the end of the range of motion when the affected part(s) is moved in flexion or extension RLE Tone Comments: equinovarus done with stepping and standing LLE Assessment LLE Assessment: Within Functional Limits Active Range of Motion (AROM) Comments: WFL for all functional mobility General Strength Comments: Grossly 5/5 throughout in sitting     L  PT, DPT  01/04/2020, 6:10 AM

## 2020-01-05 DIAGNOSIS — I612 Nontraumatic intracerebral hemorrhage in hemisphere, unspecified: Secondary | ICD-10-CM

## 2020-01-05 NOTE — Progress Notes (Signed)
Patient discharged off of unit with all belongings. Discharge papers/instructions explained by physician assistant to family. Patient and family have no further questions at time of discharge. No complications noted at this time.   L   

## 2020-01-05 NOTE — Progress Notes (Signed)
Patient ID: Melissa Colon, female   DOB: 10/20/1960, 59 y.o.   MRN: [id number]  SW spoke with [facility] care who reported she will review pt for charity HHPT/OT/SLP.  *referral accepted by [facility]. SW called [name] to inform on approval after pt discharged. SW informed there will be follow-up from branch within 2-3 days to schedule home visit.   Cecile Sheerer, MSW, LCSWA Office: [phone] Cell: [phone] Fax: [phone]

## 2020-01-05 NOTE — Discharge Instructions (Signed)
Inpatient Rehab Discharge Instructions  Melissa Colon Discharge date and time: No discharge date for patient encounter.   Activities/Precautions/ Functional Status: Activity: activity as tolerated Diet: regular diet Wound Care: Routine skin checks Functional status:  ___ No restrictions     ___ Walk up steps independently ___ 24/7 supervision/assistance   ___ Walk up steps with assistance ___ Intermittent supervision/assistance  ___ Bathe/dress independently ___ Walk with walker     _x__ Bathe/dress with assistance ___ Walk Independently    ___ Shower independently ___ Walk with assistance    ___ Shower with assistance ___ No alcohol     ___ Return to work/school ________  COMMUNITY REFERRALS UPON DISCHARGE:    Home Health:   PT     OT     ST                       Agency: Phone:    Medical Equipment/Items Ordered: drop arm bedside commode and rolling walker [facility]); donor wheelchair with right sided lamp tray                                                 Agency/Supplier: [employer] [phone]; Layla Barter [phone]  GENERAL COMMUNITY RESOURCES FOR PATIENT/FAMILY: Hospital Follow-up: [facility] [phone]) on Thursday, December 30 at 8:30am with Georgian Co, PA. Be sure to discuss financial assistance and orange card. If unable to make appointment, be sure to call and reschedule the appointment.   Special Instructions: No driving smoking or alcohol   My questions have been answered and I understand these instructions. I will adhere to these goals and the provided educational materials after my discharge from the hospital.  Patient/Caregiver Signature _______________________________ Date __________  Clinician Signature _______________________________________ Date __________  Please bring this form and your medication list with you to all your follow-up doctor's appointments.

## 2020-01-05 NOTE — Progress Notes (Signed)
Inpatient Rehabilitation Care Coordinator  Discharge Note  The overall goal for the admission was met for:   Discharge location: Yes. D/c to home.   Length of Stay: Yes. 21 days.  Discharge activity level: Yes. Min A  Home/community participation: Yes. Limited.   Services provided included: MD, RD, PT, OT, SLP, RN, CM, TR, Pharmacy, Neuropsych and SW  Financial Services: Other: Uninsured  Follow-up services arranged: Home Health: [place] for HHPT/OT/SLP (charity) and DME: [place] for [place] and RW (charity) and [place] light weight wheelchair (donor/charity)  Hospital Follow-[facility]: [facility] [phone]) on Thursday, December 30 at 8:30am with Freeman Caldron, PA.   Comments (or additional information): contact pt brother Corene Cornea [phone]  Patient/Family verbalized understanding of follow-up arrangements: Yes  Individual responsible for coordination of the follow-up plan: Pt to have assistance with coordinating care needs.   Confirmed correct DME delivered: Rana Snare 01/05/2020    Rana Snare

## 2020-01-05 NOTE — Plan of Care (Signed)
  Problem: Consults Goal: RH STROKE PATIENT EDUCATION Description: See Patient Education module for education specifics  Outcome: Completed/Met   Problem: RH SKIN INTEGRITY Goal: RH STG MAINTAIN SKIN INTEGRITY WITH ASSISTANCE Description: STG Maintain Skin Integrity With min Assistance. Outcome: Completed/Met   Problem: RH SAFETY Goal: RH STG ADHERE TO SAFETY PRECAUTIONS W/ASSISTANCE/DEVICE Description: STG Adhere to Safety Precautions With min Assistance/Device. Outcome: Completed/Met   Problem: RH PAIN MANAGEMENT Goal: RH STG PAIN MANAGED AT OR BELOW PT'S PAIN GOAL Description: <3 on a 0-10 pain scale. Outcome: Completed/Met   Problem: RH KNOWLEDGE DEFICIT Goal: RH STG INCREASE KNOWLEDGE OF HYPERTENSION Description: Patient will demonstrate knowledge of HTN medications, dietary restrictions, with min assist, and the aid of education materials and handouts. Outcome: Completed/Met Goal: RH STG INCREASE KNOWLEGDE OF HYPERLIPIDEMIA Description: Patient will demonstrate knowledge of HLD medications, dietary restrictions, with min assist, and the aid of education materials and handouts.  Outcome: Completed/Met Goal: RH STG INCREASE KNOWLEDGE OF STROKE PROPHYLAXIS Description: Patient will demonstrate knowledge of stroke preventative medications, dietary restrictions, with min assist, and the aid of education materials and handouts.  Outcome: Completed/Met

## 2020-01-05 NOTE — Progress Notes (Signed)
[place] PHYSICAL MEDICINE & REHABILITATION PROGRESS NOTE   Subjective/Complaints: No new complaints. Headaches are better. Excited to be going home but also a little apprehensive. No pain today  ROS: Patient denies fever, rash, sore throat, blurred vision, nausea, vomiting, diarrhea, cough, shortness of breath or chest pain, joint or back pain, headache, or mood change.    Objective:   No results found. Recent Labs    01/04/20 0632  WBC 7.8  HGB 11.5*  HCT 36.5  PLT 332   Recent Labs    01/04/20 0632  NA 139  K 3.7  CL 107  CO2 21*  GLUCOSE 70  BUN 21*  CREATININE 0.60  CALCIUM 9.4    Intake/Output Summary (Last 24 hours) at 01/05/2020 0959 Last data filed at 01/05/2020 0831 Gross per 24 hour  Intake 820 ml  Output --  Net 820 ml    Physical Exam: Vital Signs Blood pressure 118/74, pulse 61, temperature 98.1 F (36.7 C), temperature source Oral, resp. rate 20, height 5\' 2"  (1.575 m), weight 46.7 kg, SpO2 100 %.  Constitutional: No distress . Vital signs reviewed. HEENT: EOMI, oral membranes moist Neck: supple Cardiovascular: RRR without murmur. No JVD    Respiratory/Chest: CTA Bilaterally without wheezes or rales. Normal effort    GI/Abdomen: BS +, non-tender, non-distended Ext: no clubbing, cyanosis, or edema Psych: pleasant and cooperative Neuro:  right central 7, mild dysarthria.  Dysconjugate gaze still present. delays in processing and language output at times.  Provides name and age.  alert and aware. Right sided apraxia. RUE 3 to 3+ SA, EE, EF, 4-/5 hand grip and WE RLE 3+/5 HF, KE, 4/5 DF, PF. Motor exam stable today Right side withongoing deficits in LT,PP no change    Assessment/Plan: 1. Functional deficits which require 3+ hours per day of interdisciplinary therapy in a comprehensive inpatient rehab setting.  Physiatrist is providing close team supervision and 24 hour management of active medical problems listed below.  Physiatrist and  rehab team continue to assess barriers to discharge/monitor patient progress toward functional and medical goals  Care Tool:  Bathing    Body parts bathed by patient: Right arm,Left arm,Chest,Abdomen,Front perineal area,Right upper leg,Left upper leg,Face,Right lower leg,Left lower leg   Body parts bathed by helper: Buttocks     Bathing assist Assist Level: Minimal Assistance - Patient > 75%     Upper Body Dressing/Undressing Upper body dressing   What is the patient wearing?: Pull over shirt    Upper body assist Assist Level: Supervision/Verbal cueing    Lower Body Dressing/Undressing Lower body dressing      What is the patient wearing?: Incontinence brief,Pants     Lower body assist Assist for lower body dressing: Minimal Assistance - Patient > 75%     Toileting Toileting    Toileting assist Assist for toileting: Moderate Assistance - Patient 50 - 74%     Transfers Chair/bed transfer  Transfers assist     Chair/bed transfer assist level: Minimal Assistance - Patient > 75%     Locomotion Ambulation   Ambulation assist      Assist level: Moderate Assistance - Patient 50 - 74% Assistive device: Walker-rolling (R hand splint) Max distance: 30 ft   Walk 10 feet activity   Assist  Walk 10 feet activity did not occur: Safety/medical concerns (R hemi, R pusher, decreased motor planning, R inattention)  Assist level: Moderate Assistance - Patient - 50 - 74% Assistive device: Walker-rolling   Walk 50 feet  activity   Assist Walk 50 feet with 2 turns activity did not occur: Safety/medical concerns (decreased strength/activity tolerance)         Walk 150 feet activity   Assist Walk 150 feet activity did not occur: Safety/medical concerns         Walk 10 feet on uneven surface  activity   Assist Walk 10 feet on uneven surfaces activity did not occur: Safety/medical concerns (decreased strength/activity tolerance)          Wheelchair     Assist Will patient use wheelchair at discharge?: Yes Type of Wheelchair: Manual    Wheelchair assist level: Supervision/Verbal cueing Max wheelchair distance: >164ft    Wheelchair 50 feet with 2 turns activity    Assist        Assist Level: Supervision/Verbal cueing   Wheelchair 150 feet activity     Assist      Assist Level: Supervision/Verbal cueing   Blood pressure 118/74, pulse 61, temperature 98.1 F (36.7 C), temperature source Oral, resp. rate 20, height 5\' 2"  (1.575 m), weight 46.7 kg, SpO2 100 %.  Medical Problem List and Plan: 1.  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      2.  Antithrombotics: -DVT/anticoagulation: Subcutaneous heparin             -antiplatelet therapy: N/A 3. Pain Management: Tylenol as needed- for occasional left sided headache  -dental caries   -chlorhexidine rinse--changed to PRN   - Will need outpt dentistry f/u at some point (doesn't have dentist)  12/21 headaches (and sleep) improved with low dose topiramate at HS--     4. Mood: Provide emotional support             -antipsychotic agents: N/A  - prn trazodone to assist with sleep 5. Neuropsych: This patient is capable of making decisions on her own behalf. 6. Skin/Wound Care: Routine skin checks 7. Fluids/Electrolytes/Nutrition: good po  -I have discussed diet with pt.   8.  Hypertension.  Norvasc 5 mg daily.    12/11 bp low, decreased Norvasc to 2.5mg .   12/17: excellent control, continue norvasc 2.5mg   12/21- BP great control- con't regimen 9.  Tobacco abuse.  NicoDerm patch.  Provide counseling 10.  History of alcohol use.  Provide counseling. Continue folic acid  supplement 11. Tachycardic to 103 on admission-      resolved  12/20: HR incontrolled currently  12. Urinary retention:    12/2-ucx with 100k aerococcus urinae.    -amoxil 12/2-12/4 completed 13. Acute blood loss anemia: Monitor Hgb weekly. 11.5 on 12/20 14. Multiple BM per day: stopped senna-docusate--formed stool 12/3  12/8 -resumed senna-s, miralax  Moving bowels regularly at discharge    LOS: 22 days A FACE TO FACE EVALUATION WAS PERFORMED  14/8 01/05/2020, 9:59 AM

## 2020-01-11 ENCOUNTER — Telehealth: Payer: Self-pay

## 2020-01-11 NOTE — Telephone Encounter (Signed)
Theron Arista, PT from [facility] called requesting HHPT 1wk2 or 1wk3, which ever the charity pays for. Called Theron Arista back to approve orders and no answer no name recognition on VM. Left message to call back.

## 2020-01-12 ENCOUNTER — Telehealth: Payer: Self-pay | Admitting: Physical Medicine & Rehabilitation

## 2020-01-12 NOTE — Telephone Encounter (Signed)
Melissa Colon , from [facility] called requesting verbal order for speech therapy . Her phone number is [phone].

## 2020-01-12 NOTE — Progress Notes (Deleted)
Patient ID: Melissa Colon, female   DOB: 18-Feb-1960, 59 y.o.   MRN: [id number]   After hospitalization 11/25-11/29 the inpatient physical rehab.  From respective discharge summaries:  Recommendations for Outpatient Follow-up:  1. Follow up with PCP in 1-2 weeks 2. Please obtain BMP/CBC in one week your next doctors visit.  3. Follow-up outpatient neurology in 3-4 weeks 4. Counseled to quit using alcohol.  Continue taking multivitamin, folic acid and thiamine daily 5. Nicotine patch prescribed 6. Norvasc 5 mg daily, atorvastatin 40 mg daily prescribed 7. Needs to have daily 1-2 soft bowel movements.  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 CT head showed left-sided thalamic stroke confirmed on MRI brain. Repeat CT head was stable. MRI showed high-grade P2 stenosis. Echocardiogram EF 60 to 65%, LDL 92, A1c 5.2, carotid Dopplers unremarkable. PT recommended CIR therefore awaiting placement.   Lipitor 40 mg daily prescribed at discharge. Her brother has been updated by me yesterday regarding her hospital stay and care plan.  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 Repeat CT head was stable. MRI showed high-grade P2 stenosis. Echocardiogram EF 60 to 65%, LDL 92, A1c 5.2, carotid Dopplers unremarkable. PT recommended CIR therefore awaiting placement. Possible statin at discharge. -Plans to transfer patient  to CIR, hopefully today  Essential hypertension -No prior history.   Now on daily 5 mg Norvasc.  2 g salt diet  Hyperlipidemia -LDL 92, Lipitor 40 mg daily at discharge  Alcohol abuse -Closely monitor for any signs of withdrawal.  Counseled to quit using alcohol. 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  Per chart review lives with spouse son brother-in-law and sister-in-law.  Reportedly independent prior to admission.  Presented 12/10/2019 with right side weakness and mild aphasia.  Cranial CT scan showed acute intraparenchymal hemorrhage in the left thalamus measuring 2.5 x 1.3 x 2.1 cm.  Mild surrounding edema.  Old bilateral occipital insults with dystrophic calcification.  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  MRA of the head high-grade proximal left P2 segment stenosis.  No other significant proximal stenosis aneurysm or branch vessel occlusion.  Follow-up MRI showed a stable 2.4 cm acute hemorrhage left thalamus with edema extending into the left cerebellar peduncle and posterior limb left internal capsule.  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  Admission chemistries alcohol negative sodium 131 hemoglobin 11.6 urine drug screen negative.  Echocardiogram with ejection fraction of 60 to 65% no wall motion abnormalities grade 1 diastolic dysfunction.  Neurology follow-up close monitoring of blood pressure.  Patient was cleared to begin subcutaneous heparin for DVT prophylaxis.  Tolerating a regular diet.  Due to patient's acute right side weakness and aphasia she was admitted to inpatient rehab services for comprehensive rehab therapies.   Hospital Course: Melissa Colon was admitted to rehab 12/14/2019 for inpatient therapies to consist of PT, ST and OT at least three hours five days a week. Past admission physiatrist, therapy team and rehab RN have worked together to provide customized collaborative inpatient rehab.  Pertaining to patient's left thalamic hemorrhage as well as history of right cerebellar brain tumor with excision craniotomy teenager remained stable she would follow-up neurology services.  She had been cleared for subcutaneous heparin for DVT prophylaxis no bleeding episodes.  Blood pressure controlled on Norvasc and would need follow-up outpatient.  History of tobacco abuse NicoDerm patch receiving counsel guard cessation of nicotine products as well is noted history of alcohol use and again receiving counseling.  Bouts of urinary retention PVRs improved completed course of amoxicillin for Enterococcus UTI.   Blood pressures were monitored on TID basis and controlled     Rehab course: During patient's stay in rehab weekly team conferences were held to monitor patient's progress, set goals and discuss barriers to discharge. At admission, patient required moderate assist sit to stand moderate assist ambulate 5 feet to person hand-held assist.  Moderate assist upper body bathing mod assist lower body bathing mod assist upper body dressing mod assist lower body dressing  Medications at discharge 1.  Tylenol  as needed 2.  Norvasc 2.5 mg daily 3.  Lipitor 40 mg daily 4.  Multivitamin daily 5.  NicoDerm patch taper as directed 6.  Protonix 40 mg daily 7.  MiraLAX daily hold for loose stools 8.  Topamax 25 mg nightly

## 2020-01-14 ENCOUNTER — Inpatient Hospital Stay: Payer: Self-pay | Admitting: Physician Assistant

## 2020-01-18 ENCOUNTER — Encounter: Payer: Self-pay | Admitting: Adult Health

## 2020-01-18 NOTE — Telephone Encounter (Signed)
That's fine thx

## 2020-01-18 NOTE — Telephone Encounter (Signed)
Approval given

## 2020-01-25 ENCOUNTER — Telehealth: Payer: Self-pay

## 2020-01-25 NOTE — Telephone Encounter (Signed)
D-ischarge notes reviewed per protocol : Message left for Demetrio Lapping OT with [facility] to provide OT once a week for 2 weeks.  (CB# [phone]).

## 2020-01-26 ENCOUNTER — Encounter: Payer: Self-pay | Admitting: Registered Nurse

## 2020-02-03 ENCOUNTER — Encounter: Payer: Self-pay | Admitting: Adult Health

## 2020-02-04 ENCOUNTER — Inpatient Hospital Stay: Payer: MEDICAID | Admitting: Adult Health

## 2020-02-29 ENCOUNTER — Inpatient Hospital Stay: Payer: MEDICAID | Admitting: Adult Health

## 2020-05-18 ENCOUNTER — Other Ambulatory Visit (HOSPITAL_COMMUNITY): Payer: Self-pay

## 2021-09-13 ENCOUNTER — Emergency Department
Admission: EM | Admit: 2021-09-13 | Discharge: 2021-09-13 | Disposition: A | Discharge status: Home / Self Care | Payer: Self-pay | Attending: Emergency Medicine | Admitting: Emergency Medicine

## 2021-09-13 ENCOUNTER — Other Ambulatory Visit: Payer: Self-pay

## 2021-09-13 ENCOUNTER — Emergency Department: Payer: Medicaid Other

## 2021-09-13 DIAGNOSIS — R471 Dysarthria and anarthria: Secondary | ICD-10-CM | POA: Insufficient documentation

## 2021-09-13 DIAGNOSIS — Z20822 Contact with and (suspected) exposure to covid-19: Secondary | ICD-10-CM | POA: Insufficient documentation

## 2021-09-13 DIAGNOSIS — E876 Hypokalemia: Secondary | ICD-10-CM | POA: Insufficient documentation

## 2021-09-13 DIAGNOSIS — K529 Noninfective gastroenteritis and colitis, unspecified: Secondary | ICD-10-CM | POA: Insufficient documentation

## 2021-09-13 DIAGNOSIS — R531 Weakness: Secondary | ICD-10-CM | POA: Insufficient documentation

## 2021-09-13 LAB — CBC WITH DIFFERENTIAL/PLATELET
Abs Immature Granulocytes: 0.04 10*3/uL (ref 0.00–0.07)
Basophils Absolute: 0 10*3/uL (ref 0.0–0.1)
Basophils Relative: 0 %
Eosinophils Absolute: 0.1 10*3/uL (ref 0.0–0.5)
Eosinophils Relative: 1 %
HCT: 41.7 % (ref 36.0–46.0)
Hemoglobin: 12.6 g/dL (ref 12.0–15.0)
Immature Granulocytes: 0 %
Lymphocytes Relative: 9 %
Lymphs Abs: 1.4 10*3/uL (ref 0.7–4.0)
MCH: 26.4 pg (ref 26.0–34.0)
MCHC: 30.2 g/dL (ref 30.0–36.0)
MCV: 87.4 fL (ref 80.0–100.0)
Monocytes Absolute: 0.8 10*3/uL (ref 0.1–1.0)
Monocytes Relative: 5 %
Neutro Abs: 12.3 10*3/uL — ABNORMAL HIGH (ref 1.7–7.7)
Neutrophils Relative %: 85 %
Platelets: 288 10*3/uL (ref 150–400)
RBC: 4.77 MIL/uL (ref 3.87–5.11)
RDW: 14.5 % (ref 11.5–15.5)
WBC: 14.6 10*3/uL — ABNORMAL HIGH (ref 4.0–10.5)
nRBC: 0 % (ref 0.0–0.2)

## 2021-09-13 LAB — COMPREHENSIVE METABOLIC PANEL
ALT: 18 U/L (ref 0–44)
AST: 22 U/L (ref 15–41)
Albumin: 3.3 g/dL — ABNORMAL LOW (ref 3.5–5.0)
Alkaline Phosphatase: 111 U/L (ref 38–126)
Anion gap: 8 (ref 5–15)
BUN: 14 mg/dL (ref 6–20)
CO2: 24 mmol/L (ref 22–32)
Calcium: 7.6 mg/dL — ABNORMAL LOW (ref 8.9–10.3)
Chloride: 113 mmol/L — ABNORMAL HIGH (ref 98–111)
Creatinine, Ser: 0.66 mg/dL (ref 0.44–1.00)
GFR, Estimated: >60 mL/min (ref >60)
Glucose, Bld: 139 mg/dL — ABNORMAL HIGH (ref 70–99)
Potassium: 3.2 mmol/L — ABNORMAL LOW (ref 3.5–5.1)
Sodium: 145 mmol/L (ref 135–145)
Total Bilirubin: 0.5 mg/dL (ref 0.3–1.2)
Total Protein: 7.1 g/dL (ref 6.5–8.1)

## 2021-09-13 LAB — URINALYSIS, ROUTINE W REFLEX MICROSCOPIC
Bilirubin Urine: NEGATIVE
Glucose, UA: NEGATIVE mg/dL
Hgb urine dipstick: NEGATIVE
Ketones, ur: NEGATIVE mg/dL
Leukocytes,Ua: NEGATIVE
Nitrite: NEGATIVE
Protein, ur: NEGATIVE mg/dL
Specific Gravity, Urine: >1.046 — ABNORMAL HIGH (ref 1.005–1.030)
pH: 7 (ref 5.0–8.0)

## 2021-09-13 LAB — MAGNESIUM: Magnesium: 2 mg/dL (ref 1.7–2.4)

## 2021-09-13 LAB — LIPASE, BLOOD: Lipase: 37 U/L (ref 11–51)

## 2021-09-13 LAB — TROPONIN I (HIGH SENSITIVITY)
Troponin I (High Sensitivity): 4 ng/L (ref <18)
Troponin I (High Sensitivity): 3 ng/L (ref <18)

## 2021-09-13 LAB — SARS CORONAVIRUS 2 BY RT PCR: SARS Coronavirus 2 by RT PCR: NEGATIVE

## 2021-09-13 MED ORDER — ONDANSETRON 4 MG PO TBDP: 4.0000 mg | ORAL_TABLET | Freq: Three times a day (TID) | ORAL | 0 refills | Status: AC | PRN

## 2021-09-13 MED ORDER — POTASSIUM CHLORIDE CRYS ER 20 MEQ PO TBCR
40.0000 meq | EXTENDED_RELEASE_TABLET | Freq: Once | ORAL | Status: AC
Start: 2021-09-13 — End: 2021-09-13
  Administered 2021-09-13: 40 meq via ORAL
  Filled 2021-09-13: qty 2

## 2021-09-13 MED ORDER — ONDANSETRON HCL 4 MG/2ML IJ SOLN
4.0000 mg | Freq: Once | INTRAMUSCULAR | Status: AC
  Administered 2021-09-13: 4 mg via INTRAVENOUS
  Filled 2021-09-13: qty 2

## 2021-09-13 MED ORDER — IOHEXOL 300 MG/ML  SOLN
100.0000 mL | Freq: Once | INTRAMUSCULAR | Status: AC | PRN
  Administered 2021-09-13: 100 mL via INTRAVENOUS

## 2021-09-13 NOTE — ED Notes (Signed)
CALLED [facility] FOR TRANSPORT HOME... NOTHING AVAILABLE UNTIL 9:00AM

## 2021-09-13 NOTE — ED Triage Notes (Signed)
PER EMS: pt had a sudden episode of large amount of diarrhea this morning and one episode of vomiting. A&OX4. Hx of stroke 2 years ago with residual deficits to right side, denies new weakness or numbness  BP- 100/palp, HR-80, O2-100% RA, CBG-148  500cc NS given en route, 18g LAC

## 2021-09-13 NOTE — Discharge Instructions (Addendum)
Her work-up was reassuring I suspect this is most likely an infection she should not eat the food that was eaten prior to coming in case it is related to that.  Return to the ER for fevers, worsening pain or any other concerns. Call your primary doctor to have MRI outpatient done for CT below.   IMPRESSION: 1. No acute finding. 2. 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 If appropriate for comorbidities, recommend pancreas protocol MRI with contrast to evaluate internal characteristics. 3. Atherosclerosis including the coronary arteries.

## 2021-09-13 NOTE — ED Notes (Signed)
Waiting for EMS transport after 0900.

## 2021-09-13 NOTE — ED Provider Notes (Addendum)
[facility] Provider Note    Event Date/Time   First MD Initiated Contact with Patient 09/13/21 [phone]     (approximate)   History   Diarrhea   HPI  Melissa Colon is a 61 y.o. female with a right cerebral brain tumor status postcraniotomy as a teenager, prior intracranial hemorrhage.  I reviewed patient's admission summary from November 2021.  Patient had a right-sided weakness and mild aphasia with a CT scan showing a thalamus intraparenchymal hemorrhage.  With some old other infarcts as well.  I reviewed the neurology note from 12/12/2019 where patient was noted to have right-sided weakness as well as decreased sensation on the right.  Patient reports having sudden onset of large amount of diarrhea as well as an episode of vomiting.  Patient does have a history of prior stroke but denies any new weakness or numbness.  She denies any falls or hitting her head.  She denies any chest pain, shortness of breath.  She does report having some queasiness in her abdomen.   Physical Exam   Triage Vital Signs: Blood pressure 103/88, pulse 90, temperature 97.9 F (36.6 C), temperature source Oral, resp. rate 20, height 5\' 2"  (1.575 m), weight 68.2 kg, SpO2 97 %.   Most recent vital signs: Vitals:   09/13/21 0256  BP: 103/88  Pulse: 90  Resp: 20  Temp: 97.9 F (36.6 C)  SpO2: 97%     General: Awake, no distress.  CV:  Good peripheral perfusion.  Resp:  Normal effort.  Abd:  No distention.  Mild tenderness Other:  Weakness on the right, sensation changes on R, some mild dysarthria.  Prior craniotomy.  Some hair loss noted   ED Results / Procedures / Treatments   Labs (all labs ordered are listed, but only abnormal results are displayed) Labs Reviewed  CBC WITH DIFFERENTIAL/PLATELET - Abnormal; Notable for the following components:      Result Value   WBC 14.6 (*)    Neutro Abs 12.3 (*)    All other components within normal limits  COMPREHENSIVE  METABOLIC PANEL - Abnormal; Notable for the following components:   Potassium 3.2 (*)    Chloride 113 (*)    Glucose, Bld 139 (*)    Calcium 7.6 (*)    Albumin 3.3 (*)    All other components within normal limits  RESP PANEL BY RT-PCR (FLU A&B, COVID) ARPGX2  GASTROINTESTINAL PANEL BY PCR, STOOL (REPLACES STOOL CULTURE)  C DIFFICILE QUICK SCREEN W PCR REFLEX    LIPASE, BLOOD  MAGNESIUM  TROPONIN I (HIGH SENSITIVITY)     EKG  My interpretation of EKG:  Sinus rate of 93 without any ST elevation or T wave inversions, normal intervals.  Unclear what is reading atrial flutter appears sinus but we can get a repeat to confirm  Sinus rate of 87 without any ST elevation or T wave inversions, normal intervals  RADIOLOGY I have reviewed the CT personally and interpreted and chronic findings but no acute bleed.  IMPRESSION: 1. No acute finding. 2. 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 If appropriate for comorbidities, recommend pancreas protocol MRI with contrast to evaluate internal characteristics. 3. Atherosclerosis including the coronary arteries.  PROCEDURES:  Critical Care performed: No  .1-3 Lead EKG Interpretation  Performed by: 09/15/21, MD Authorized by: Concha Se, MD     Interpretation: normal     ECG rate:  90   ECG rate assessment: normal     Rhythm: sinus  rhythm     Ectopy: none     Conduction: normal      MEDICATIONS ORDERED IN ED: Medications  potassium chloride SA (KLOR-CON M) CR tablet 40 mEq (has no administration in time range)  ondansetron (ZOFRAN) injection 4 mg (4 mg Intravenous Given 09/13/21 0348)  iohexol (OMNIPAQUE) 300 MG/ML solution 100 mL (100 mLs Intravenous Contrast Given 09/13/21 0406)     IMPRESSION / MDM / ASSESSMENT AND PLAN / ED COURSE  I reviewed the triage vital signs and the nursing notes.   Patient's presentation is most consistent with acute presentation with potential threat to life or bodily function.   Differential includes  abdominal infection, colitis, perforation, obstruction, intercranial hemorrhage.  Patient seems to be at her normal neurological baseline self but given the vomiting will get CT head evaluate for any evidence of intercranial mass, intracranial hemorrhage as well as CT abdomen.  This could just be gastroenteritis.  Patient's white count is elevated with a left shift.  CMP shows slightly low potassium lipase is normal troponin negative magnesium normal.  Patient's husband is now at bedside he does report that he actually ate the same food that she ate for dinner and that he had multiple episodes of diarrhea as well.  CT head and abdomen pelvis negative for acute path findings.  There is a pancreatic cyst noted which I have discussed with patient to follow-up outpatient for MRI.  She expressed understanding-provided a copy of report.  Suspect patient will be discharged after urine, repeat troponin    Repeat troponin is negative.  Urine without evidence of UTI.  COVID test was negative.  Did call lab to discuss this with them.  Patient reports feeling better.  Her husband does report having similar diarrhea and they think that it was something that they would have Eaten.  We discussed trying that food out given they had some chicken and pork fried rice.   Considered admission given elevated white count but given afebrile I suspect this is more likely related to the nausea and vomiting and patient is feeling much better we will allow patient to go home.  Repeat abdominal exam is soft and nontender  The patient is on the cardiac monitor to evaluate for evidence of arrhythmia and/or significant heart rate changes.      FINAL CLINICAL IMPRESSION(S) / ED DIAGNOSES   Final diagnoses:  Gastroenteritis  Hypokalemia     Rx / DC Orders   ED Discharge Orders     None        Note:  This document was prepared using Dragon voice recognition software and may include unintentional dictation  errors.   Concha Se, MD 09/13/21 1540    Concha Se, MD 09/13/21 [phone]

## 2022-06-16 ENCOUNTER — Other Ambulatory Visit: Payer: Self-pay

## 2022-06-16 ENCOUNTER — Emergency Department
Admission: EM | Admit: 2022-06-16 | Discharge: 2022-06-16 | Disposition: A | Discharge status: Home / Self Care | Payer: Medicaid Other | Attending: Emergency Medicine | Admitting: Emergency Medicine

## 2022-06-16 ENCOUNTER — Emergency Department: Payer: Medicaid Other

## 2022-06-16 DIAGNOSIS — I10 Essential (primary) hypertension: Secondary | ICD-10-CM | POA: Insufficient documentation

## 2022-06-16 DIAGNOSIS — R1033 Periumbilical pain: Secondary | ICD-10-CM | POA: Diagnosis present

## 2022-06-16 DIAGNOSIS — K5900 Constipation, unspecified: Secondary | ICD-10-CM | POA: Insufficient documentation

## 2022-06-16 LAB — COMPREHENSIVE METABOLIC PANEL
ALT: 28 U/L (ref 0–44)
AST: 32 U/L (ref 15–41)
Albumin: 4.1 g/dL (ref 3.5–5.0)
Alkaline Phosphatase: 108 U/L (ref 38–126)
Anion gap: 7 (ref 5–15)
BUN: 12 mg/dL (ref 6–20)
CO2: 24 mmol/L (ref 22–32)
Calcium: 8.9 mg/dL (ref 8.9–10.3)
Chloride: 107 mmol/L (ref 98–111)
Creatinine, Ser: 0.67 mg/dL (ref 0.44–1.00)
GFR, Estimated: >60 mL/min (ref >60)
Glucose, Bld: 119 mg/dL — ABNORMAL HIGH (ref 70–99)
Potassium: 3.3 mmol/L — ABNORMAL LOW (ref 3.5–5.1)
Sodium: 138 mmol/L (ref 135–145)
Total Bilirubin: 0.4 mg/dL (ref 0.3–1.2)
Total Protein: 8.2 g/dL — ABNORMAL HIGH (ref 6.5–8.1)

## 2022-06-16 LAB — CBC
HCT: 42.9 % (ref 36.0–46.0)
Hemoglobin: 13.7 g/dL (ref 12.0–15.0)
MCH: 28 pg (ref 26.0–34.0)
MCHC: 31.9 g/dL (ref 30.0–36.0)
MCV: 87.7 fL (ref 80.0–100.0)
Platelets: 310 10*3/uL (ref 150–400)
RBC: 4.89 MIL/uL (ref 3.87–5.11)
RDW: 12.7 % (ref 11.5–15.5)
WBC: 9.6 10*3/uL (ref 4.0–10.5)
nRBC: 0 % (ref 0.0–0.2)

## 2022-06-16 LAB — LIPASE, BLOOD: Lipase: 38 U/L (ref 11–51)

## 2022-06-16 NOTE — Discharge Instructions (Addendum)
Please use MiraLAX one half capful every hour until your first bowel movement.  Please do not take any MiraLAX after this for at least 24 hours.  You may use one half capful twice a day of MiraLAX in order to have 1 solid well-formed bowel movement per day.  You may increase or decrease this dosage as needed to obtain this 1 well-formed bowel movement.  Please make sure that you are drinking at least 8 ounces of water every hour during this initial bowel regimen. ?

## 2022-06-16 NOTE — ED Notes (Signed)
called to [facility] for transport home/rep:allison.Marland Kitchen

## 2022-06-16 NOTE — ED Provider Notes (Signed)
[facility] Provider Note   Event Date/Time   First MD Initiated Contact with Patient 06/16/22 1918     (approximate) History  Abdominal Pain  HPI Melissa Colon is a 62 y.o. female with a past medical history of hypertension and hypercholesterolemia who presents complaining of periumbilical pain over the last 24 hours and complaining of decreased bowel movements.  Patient states that over the last 24 hours she has had small hard stools that she describes as "rabbit pellets".  Patient states that this is because abdominal pain for her in the past but she does not take any stool softeners or laxatives. ROS: Patient currently denies any vision changes, tinnitus, difficulty speaking, facial droop, sore throat, chest pain, shortness of breath,  nausea/vomiting/diarrhea, dysuria, or weakness/numbness/paresthesias in any extremity   Physical Exam  Triage Vital Signs: ED Triage Vitals [06/16/22 1832]  Enc Vitals Group     BP (!) 158/88     Pulse Rate 92     Resp 16     Temp 98 F (36.7 C)     Temp Source Oral     SpO2 94 %     Weight      Height      Head Circumference      Peak Flow      Pain Score 4     Pain Loc      Pain Edu?      Excl. in GC?    Most recent vital signs: Vitals:   06/16/22 1940 06/16/22 2000  BP: (!) 140/76 138/76  Pulse: 89 86  Resp:    Temp:    SpO2: 97% 98%   General: Awake, oriented x4. CV:  Good peripheral perfusion.  Resp:  Normal effort.  Abd:  No distention.  Nontender to palpation Other:  Middle-aged obese African-American female laying in bed in no acute distress ED Results / Procedures / Treatments  Labs (all labs ordered are listed, but only abnormal results are displayed) Labs Reviewed  COMPREHENSIVE METABOLIC PANEL - Abnormal; Notable for the following components:      Result Value   Potassium 3.3 (*)    Glucose, Bld 119 (*)    Total Protein 8.2 (*)    All other components within normal limits  LIPASE,  BLOOD  CBC  URINALYSIS, ROUTINE W REFLEX MICROSCOPIC  POC URINE PREG, ED  RADIOLOGY ED MD interpretation: Single view portable abdominal x-ray interpreted independently by me and shows normal nonobstructive bowel gas pattern -Agree with radiology assessment Official radiology report(s): DG Abdomen 1 View  Result Date: 06/16/2022 CLINICAL DATA:  Constipation and abdominal pain EXAM: ABDOMEN - 1 VIEW COMPARISON:  CT 09/13/2021 FINDINGS: Scattered gas and stool throughout the colon. No small or large bowel distention. No radiopaque stones. Vascular calcifications. Visualized bones and soft tissue contours appear intact. Lung bases are clear. IMPRESSION: Normal nonobstructive bowel gas pattern. Electronically Signed   By: Burman Nieves M.D.   On: 06/16/2022 20:58   PROCEDURES: Critical Care performed: No .1-3 Lead EKG Interpretation  Performed by: Merwyn Katos, MD Authorized by: Merwyn Katos, MD     Interpretation: normal     ECG rate:  71   ECG rate assessment: normal     Rhythm: sinus rhythm     Ectopy: none     Conduction: normal    MEDICATIONS ORDERED IN ED: Medications - No data to display IMPRESSION / MDM / ASSESSMENT AND PLAN / ED COURSE  I reviewed  the triage vital signs and the nursing notes.                             The patient is on the cardiac monitor to evaluate for evidence of arrhythmia and/or significant heart rate changes. Patient's presentation is most consistent with acute presentation with potential threat to life or bodily function. Patients history and exam most consistent with constipation as an etiology for their pain.  Patients symptoms not typical for other emergent causes of abdominal pain such as, but not limited to, appendicitis, abdominal aortic aneurysm, pancreatitis, SBO, mesenteric ischemia, serious intra-abdominal bacterial illness.  Patient without red flags concerning for cancer as a constipation etiology.  Rx: Miralax  Disposition:   Patient will be discharged with strict return precautions and follow up with primary MD within 24-48 hours for further evaluation. Patient understands that this still may have an early presentation of an emergent medical condition such as appendicitis that will require a recheck.   FINAL CLINICAL IMPRESSION(S) / ED DIAGNOSES   Final diagnoses:  Periumbilical abdominal pain  Constipation, unspecified constipation type   Rx / DC Orders   ED Discharge Orders          Ordered    Ambulatory Referral to Primary Care (Establish Care)        06/16/22 2156           Note:  This document was prepared using Dragon voice recognition software and may include unintentional dictation errors.   Merwyn Katos, MD 06/16/22 2223

## 2022-06-16 NOTE — ED Triage Notes (Signed)
Pt presents via EMS c/o abd pain x2 days. Reports small BMs over the last 2 days.

## 2022-06-25 IMAGING — MR MR MRA HEAD W/O CM
2 series · 18 of 48 positions shown · non-contrast
Comparison: None.

CT head without contrast 12/10/2019

CLINICAL DATA: Code stroke. Neurologic deficit. Cerebral
hemorrhage. Right hemiparesis. Left gaze preference.

EXAM:
MRI HEAD WITHOUT CONTRAST
MRA HEAD WITHOUT CONTRAST
TECHNIQUE: Multiplanar, multiecho pulse sequences of the brain and surrounding
structures were obtained without intravenous contrast. Angiographic
images of the head were obtained using MRA technique without
contrast.

[Series 2: ax (id) · axial · 1.0mm · 0.43mm/px · z∈[-74,+11]mm · 16 of 184 slices shown]
[im 1/184]
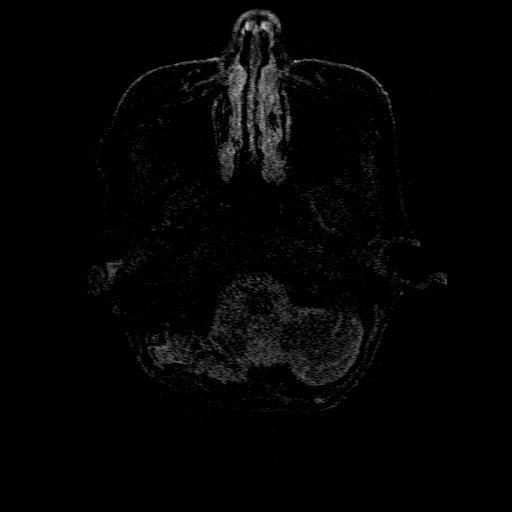
[im 5/184]
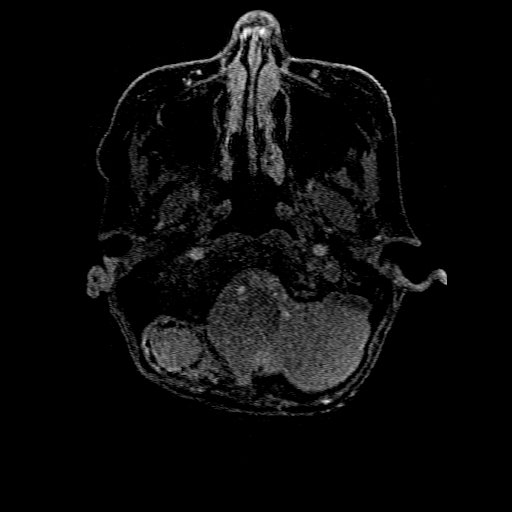
[im 9/184]
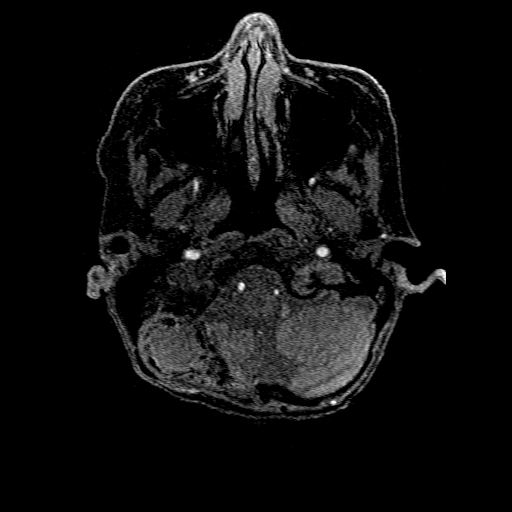
[im 13/184]
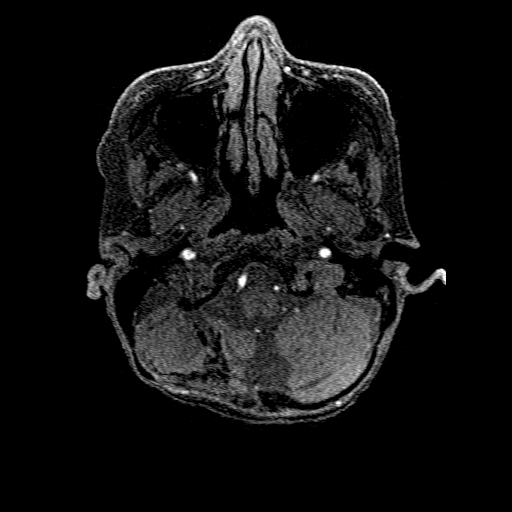
[im 17/184]
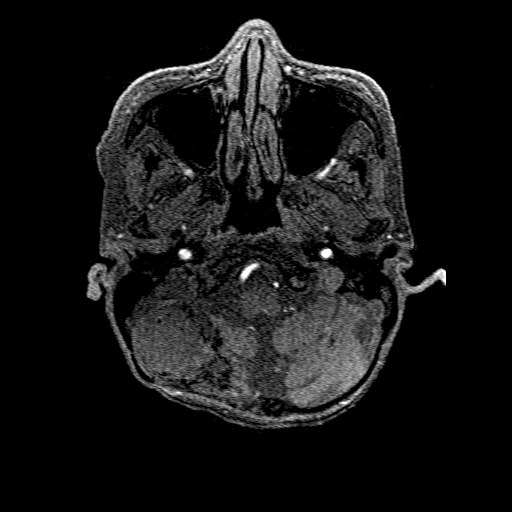
[im 21/184]
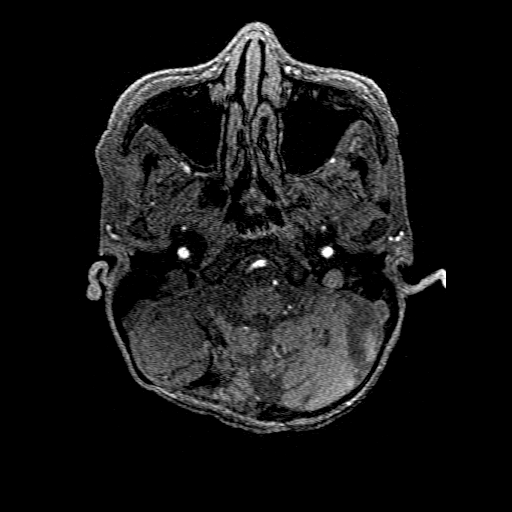
[im 29/184]
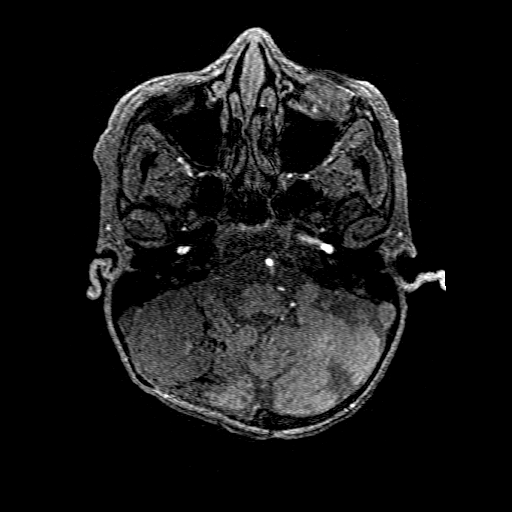
[im 33/184]
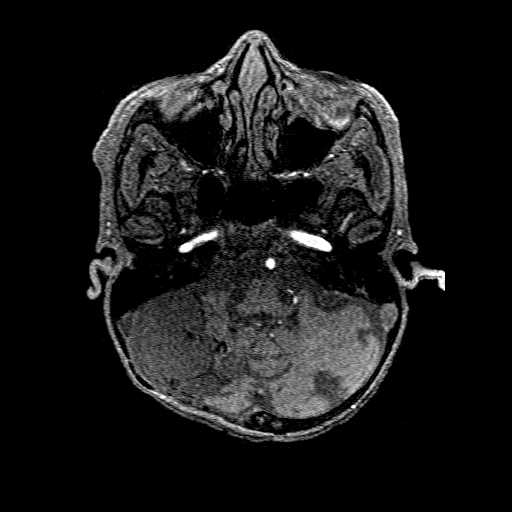
[im 57/184]
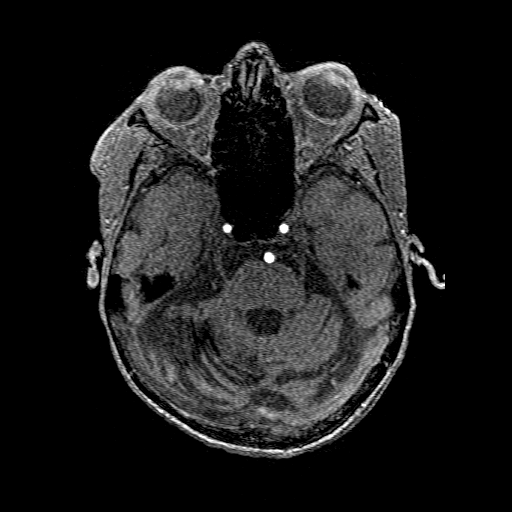
[im 82/184]
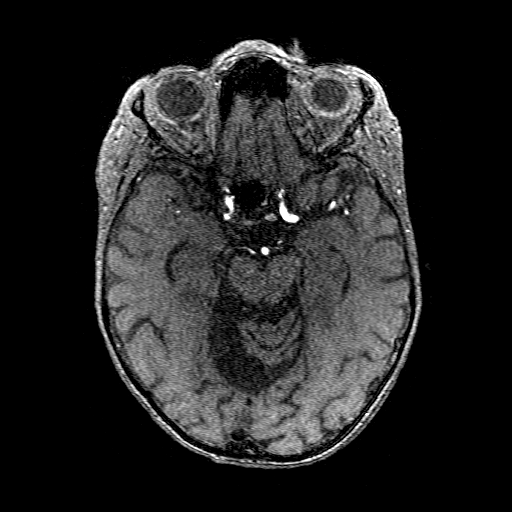
[im 94/184]
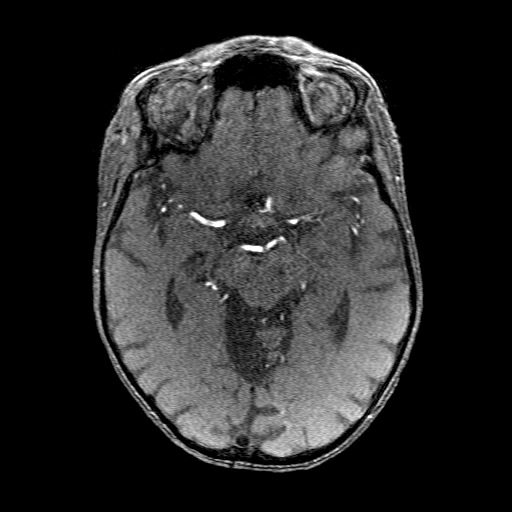
[im 102/184]
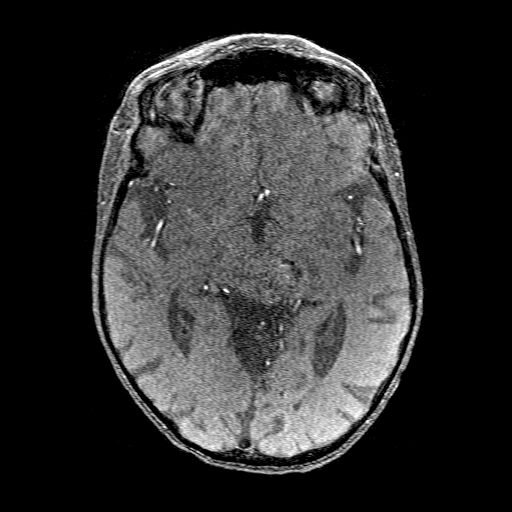
[im 127/184]
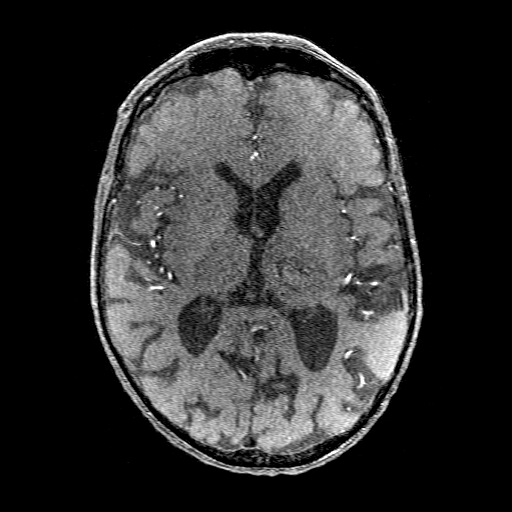
[im 151/184]
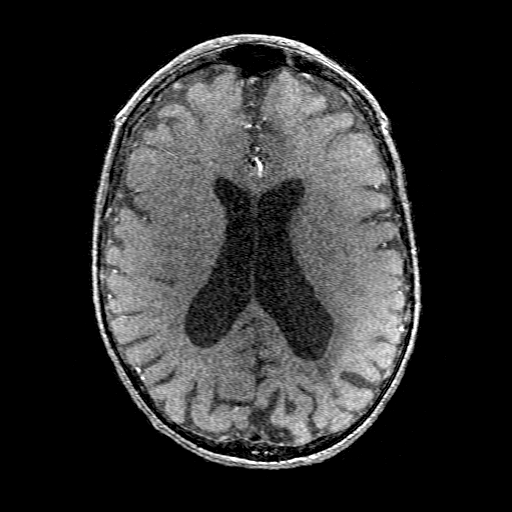
[im 155/184]
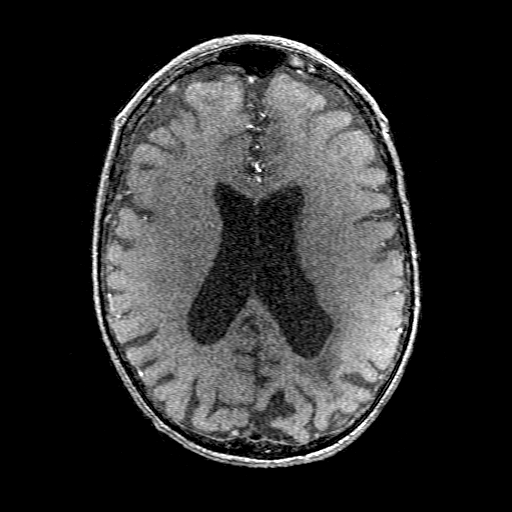
[im 175/184]
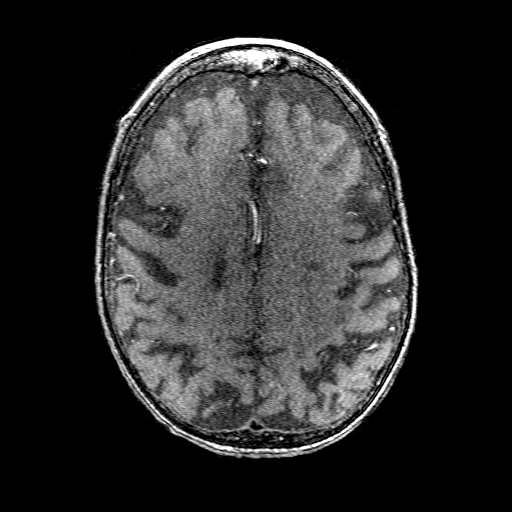

[Series 201: pjn:ax (id) · sagittal · 1.0mm · 0.43mm/px · 2 of 9 slices shown]
[im 1/9]
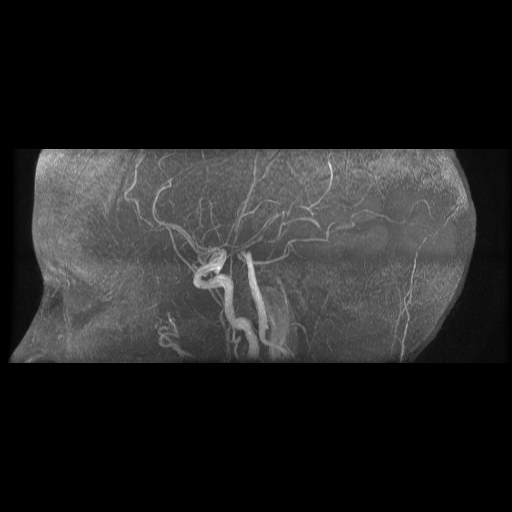
[im 9/9]
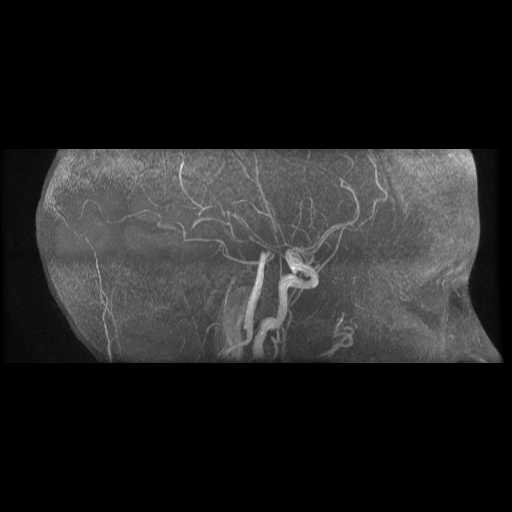

[18 of 48 positions shown; findings below may reference images not displayed]

FINDINGS: MRI HEAD FINDINGS

Brain:

Acute hemorrhage in the left thalamus measuring 2.4 cm maximally is
stable. Surrounding edema extends into the left cerebral peduncle
and superiorly into the posterior limb left internal capsule. There
is some focal mass effect. No additional hemorrhage is evident. Mass
lesion is evident.

Moderate generalized atrophy is present. Periventricular and
subcortical T2 hyperintensities are present bilaterally. White
matter changes extend into the brainstem. Chronic encephalomalacia
is noted in the right cerebellum with prior craniotomy.
Calcifications are again noted at the undersurface of the left
cerebellum. The ventricles are proportionate to the degree of
atrophy. No significant extraaxial fluid collection is present.
Internal auditory canals are unremarkable.

Vascular: Flow is present in the major intracranial arteries.

Skull and upper cervical spine: Marrow spaces demonstrate extensive
T1 shortening. Question prior radiation. Craniocervical junction is
otherwise normal.

Sinuses/Orbits: Mild mucosal thickening is present throughout the
anterior paranasal sinuses. Fluid is present in the mastoid air
cells bilaterally, left greater than right. The globes and orbits
are within normal limits.

MRA HEAD FINDINGS

Internal carotid minimal atherosclerotic irregularity is present
within arteries bilaterally the cavernous. 1.5 mm posterior
communicating artery aneurysms are noted bilaterally. ICA termini
are otherwise normal. The A1 and M1 segments are within normal
limits. The anterior communicating artery is patent. ACA and MCA
branch vessels are within normal limits.

The right vertebral artery is dominant. Left V4 segment is
hypoplastic. PICA origin is noted on the left. Basilar artery is
within limits. High-grade proximal left P2 segment stenosis is
present. Distal left PCA branch vessels are not visualized. Mild
distal atherosclerotic changes are present in the right PCA branches
which are otherwise intact.
IMPRESSION: 1. Stable 2.4 cm acute hemorrhage in the left thalamus with edema
extending into the left cerebral peduncle and posterior limb left
internal capsule.
2. Moderate generalized atrophy and white matter disease likely
reflects the sequela of chronic microvascular ischemia.
3. 1.5 mm posterior communicating artery aneurysms bilaterally.
4. High-grade proximal left P2 segment stenosis. This could
correlate with the thalamic hemorrhage. No infarct present in the
more distal left PCA territory.
5. No other significant proximal stenosis, aneurysm, or branch
vessel occlusion within the Circle of Willis. No vascular
malformation.

## 2022-07-27 ENCOUNTER — Other Ambulatory Visit: Payer: Self-pay

## 2022-07-27 ENCOUNTER — Emergency Department (HOSPITAL_COMMUNITY): Payer: Medicaid Other

## 2022-07-27 ENCOUNTER — Inpatient Hospital Stay (HOSPITAL_COMMUNITY)
Admission: EM | Admit: 2022-07-27 | Discharge: 2022-07-31 | DRG: 065 | Disposition: A | Discharge status: Rehabilitation Facility | Payer: Medicaid Other | Attending: Internal Medicine | Admitting: Internal Medicine

## 2022-07-27 DIAGNOSIS — E663 Overweight: Secondary | ICD-10-CM | POA: Diagnosis present

## 2022-07-27 DIAGNOSIS — I639 Cerebral infarction, unspecified: Secondary | ICD-10-CM | POA: Diagnosis not present

## 2022-07-27 DIAGNOSIS — Z91148 Patient's other noncompliance with medication regimen for other reason: Secondary | ICD-10-CM

## 2022-07-27 DIAGNOSIS — I69392 Facial weakness following cerebral infarction: Secondary | ICD-10-CM

## 2022-07-27 DIAGNOSIS — F17210 Nicotine dependence, cigarettes, uncomplicated: Secondary | ICD-10-CM | POA: Diagnosis present

## 2022-07-27 DIAGNOSIS — R531 Weakness: Secondary | ICD-10-CM | POA: Diagnosis not present

## 2022-07-27 DIAGNOSIS — G9389 Other specified disorders of brain: Secondary | ICD-10-CM | POA: Diagnosis present

## 2022-07-27 DIAGNOSIS — I6611 Occlusion and stenosis of right anterior cerebral artery: Secondary | ICD-10-CM | POA: Diagnosis present

## 2022-07-27 DIAGNOSIS — R29810 Facial weakness: Secondary | ICD-10-CM | POA: Diagnosis present

## 2022-07-27 DIAGNOSIS — Z8669 Personal history of other diseases of the nervous system and sense organs: Secondary | ICD-10-CM

## 2022-07-27 DIAGNOSIS — R471 Dysarthria and anarthria: Secondary | ICD-10-CM | POA: Diagnosis present

## 2022-07-27 DIAGNOSIS — I10 Essential (primary) hypertension: Secondary | ICD-10-CM | POA: Diagnosis present

## 2022-07-27 DIAGNOSIS — Z833 Family history of diabetes mellitus: Secondary | ICD-10-CM

## 2022-07-27 DIAGNOSIS — I63511 Cerebral infarction due to unspecified occlusion or stenosis of right middle cerebral artery: Principal | ICD-10-CM | POA: Diagnosis present

## 2022-07-27 DIAGNOSIS — I69251 Hemiplegia and hemiparesis following other nontraumatic intracranial hemorrhage affecting right dominant side: Secondary | ICD-10-CM

## 2022-07-27 DIAGNOSIS — E785 Hyperlipidemia, unspecified: Secondary | ICD-10-CM | POA: Diagnosis present

## 2022-07-27 DIAGNOSIS — I6629 Occlusion and stenosis of unspecified posterior cerebral artery: Secondary | ICD-10-CM | POA: Diagnosis present

## 2022-07-27 DIAGNOSIS — Z79899 Other long term (current) drug therapy: Secondary | ICD-10-CM

## 2022-07-27 DIAGNOSIS — Z923 Personal history of irradiation: Secondary | ICD-10-CM

## 2022-07-27 DIAGNOSIS — R29705 NIHSS score 5: Secondary | ICD-10-CM | POA: Diagnosis present

## 2022-07-27 DIAGNOSIS — Z6828 Body mass index (BMI) 28.0-28.9, adult: Secondary | ICD-10-CM

## 2022-07-27 DIAGNOSIS — F1020 Alcohol dependence, uncomplicated: Secondary | ICD-10-CM | POA: Diagnosis present

## 2022-07-27 DIAGNOSIS — E876 Hypokalemia: Secondary | ICD-10-CM | POA: Diagnosis present

## 2022-07-27 DIAGNOSIS — I739 Peripheral vascular disease, unspecified: Secondary | ICD-10-CM | POA: Diagnosis present

## 2022-07-27 DIAGNOSIS — R2990 Unspecified symptoms and signs involving the nervous system: Principal | ICD-10-CM

## 2022-07-27 DIAGNOSIS — I672 Cerebral atherosclerosis: Secondary | ICD-10-CM | POA: Diagnosis present

## 2022-07-27 LAB — URINALYSIS, ROUTINE W REFLEX MICROSCOPIC
Bilirubin Urine: NEGATIVE
Glucose, UA: NEGATIVE mg/dL
Hgb urine dipstick: NEGATIVE
Ketones, ur: NEGATIVE mg/dL
Nitrite: NEGATIVE
Protein, ur: NEGATIVE mg/dL
Specific Gravity, Urine: >1.046 — ABNORMAL HIGH (ref 1.005–1.030)
pH: 6 (ref 5.0–8.0)

## 2022-07-27 LAB — CBC WITH DIFFERENTIAL/PLATELET
Abs Immature Granulocytes: 0.03 10*3/uL (ref 0.00–0.07)
Basophils Absolute: 0.1 10*3/uL (ref 0.0–0.1)
Basophils Relative: 1 %
Eosinophils Absolute: 0.2 10*3/uL (ref 0.0–0.5)
Eosinophils Relative: 1 %
HCT: 39.8 % (ref 36.0–46.0)
Hemoglobin: 12.6 g/dL (ref 12.0–15.0)
Immature Granulocytes: 0 %
Lymphocytes Relative: 19 %
Lymphs Abs: 2.2 10*3/uL (ref 0.7–4.0)
MCH: 27.1 pg (ref 26.0–34.0)
MCHC: 31.7 g/dL (ref 30.0–36.0)
MCV: 85.6 fL (ref 80.0–100.0)
Monocytes Absolute: 0.7 10*3/uL (ref 0.1–1.0)
Monocytes Relative: 6 %
Neutro Abs: 8.5 10*3/uL — ABNORMAL HIGH (ref 1.7–7.7)
Neutrophils Relative %: 73 %
Platelets: 252 10*3/uL (ref 150–400)
RBC: 4.65 MIL/uL (ref 3.87–5.11)
RDW: 12.6 % (ref 11.5–15.5)
WBC: 11.7 10*3/uL — ABNORMAL HIGH (ref 4.0–10.5)
nRBC: 0 % (ref 0.0–0.2)

## 2022-07-27 LAB — I-STAT CHEM 8, ED
BUN: 12 mg/dL (ref 6–20)
Calcium, Ion: 0.93 mmol/L — ABNORMAL LOW (ref 1.15–1.40)
Chloride: 108 mmol/L (ref 98–111)
Creatinine, Ser: 0.5 mg/dL (ref 0.44–1.00)
Glucose, Bld: 118 mg/dL — ABNORMAL HIGH (ref 70–99)
HCT: 42 % (ref 36.0–46.0)
Hemoglobin: 14.3 g/dL (ref 12.0–15.0)
Potassium: 3.4 mmol/L — ABNORMAL LOW (ref 3.5–5.1)
Sodium: 138 mmol/L (ref 135–145)
TCO2: 19 mmol/L — ABNORMAL LOW (ref 22–32)

## 2022-07-27 LAB — COMPREHENSIVE METABOLIC PANEL
ALT: 30 U/L (ref 0–44)
AST: 26 U/L (ref 15–41)
Albumin: 3.4 g/dL — ABNORMAL LOW (ref 3.5–5.0)
Alkaline Phosphatase: 109 U/L (ref 38–126)
Anion gap: 17 — ABNORMAL HIGH (ref 5–15)
BUN: 11 mg/dL (ref 6–20)
CO2: 19 mmol/L — ABNORMAL LOW (ref 22–32)
Calcium: 8.6 mg/dL — ABNORMAL LOW (ref 8.9–10.3)
Chloride: 101 mmol/L (ref 98–111)
Creatinine, Ser: 0.67 mg/dL (ref 0.44–1.00)
GFR, Estimated: >60 mL/min (ref >60)
Glucose, Bld: 112 mg/dL — ABNORMAL HIGH (ref 70–99)
Potassium: 3.6 mmol/L (ref 3.5–5.1)
Sodium: 137 mmol/L (ref 135–145)
Total Bilirubin: 0.4 mg/dL (ref 0.3–1.2)
Total Protein: 7.1 g/dL (ref 6.5–8.1)

## 2022-07-27 LAB — CBG MONITORING, ED: Glucose-Capillary: 103 mg/dL — ABNORMAL HIGH (ref 70–99)

## 2022-07-27 LAB — RAPID URINE DRUG SCREEN, HOSP PERFORMED
Amphetamines: NOT DETECTED
Barbiturates: NOT DETECTED
Benzodiazepines: NOT DETECTED
Cocaine: NOT DETECTED
Opiates: NOT DETECTED
Tetrahydrocannabinol: NOT DETECTED

## 2022-07-27 LAB — PROTIME-INR
INR: 1 (ref 0.8–1.2)
Prothrombin Time: 13.5 seconds (ref 11.4–15.2)

## 2022-07-27 LAB — ETHANOL: Alcohol, Ethyl (B): <10 mg/dL (ref <10)

## 2022-07-27 LAB — MAGNESIUM: Magnesium: 1.9 mg/dL (ref 1.7–2.4)

## 2022-07-27 LAB — APTT: aPTT: 29 seconds (ref 24–36)

## 2022-07-27 MED ORDER — PROCHLORPERAZINE EDISYLATE 10 MG/2ML IJ SOLN: 5.0000 mg | Freq: Four times a day (QID) | INTRAMUSCULAR | Status: DC | PRN

## 2022-07-27 MED ORDER — SODIUM CHLORIDE 0.9% FLUSH
3.0000 mL | Freq: Once | INTRAVENOUS | Status: AC
  Administered 2022-07-27: 3 mL via INTRAVENOUS

## 2022-07-27 MED ORDER — IOHEXOL 350 MG/ML SOLN
75.0000 mL | Freq: Once | INTRAVENOUS | Status: AC | PRN
  Administered 2022-07-27: 75 mL via INTRAVENOUS

## 2022-07-27 MED ORDER — ENOXAPARIN SODIUM 40 MG/0.4ML IJ SOSY
40.0000 mg | PREFILLED_SYRINGE | INTRAMUSCULAR | Status: DC
  Administered 2022-07-27 – 2022-07-30 (×4): 40 mg via SUBCUTANEOUS
  Filled 2022-07-27 (×4): qty 0.4

## 2022-07-27 NOTE — ED Triage Notes (Signed)
Pt BIB EMS due to code stroke. LSN 1230. Pt having right sided facial droop, slurred speech, and aphasia. Hx of stroke and brain cancer. Axox4.

## 2022-07-27 NOTE — ED Provider Notes (Signed)
[facility] AT [facility] Provider Note   CSN: [id number] Arrival date & time: 07/27/22  1616     History {Add pertinent medical, surgical, social history, OB history to HPI:1} No chief complaint on file.   Melissa Colon is a 62 y.o. 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 Family noted right-sided facial droop, slurred speech, increased right-sided weakness that began at 1450 p.m.  Patient is normally in a x 4 and is currently A&O x 1-2. She complains of no CP/SOB. Doesn't take blood thinners. Had brain tumor resected w/ radiation when she was a child but had no deficits from that. Hemorrhagic stroke in 2021. CBG ***  HPI     Home Medications Prior to Admission medications   Medication Sig Start Date End Date Taking? Authorizing Provider  acetaminophen (TYLENOL) 325 MG tablet Take 2 tablets (650 mg total) by mouth every 4 (four) hours as needed for mild pain (or temp > 37.5 C (99.5 F)). 12/14/19   Amin, Ankit Chirag, MD  amLODipine (NORVASC) 2.5 MG tablet TAKE 1 TABLET (2.5 MG TOTAL) BY MOUTH DAILY. 01/04/20 01/03/21  Angiulli, Mcarthur Rossetti, PA-C  atorvastatin (LIPITOR) 40 MG tablet TAKE 1 TABLET (40 MG TOTAL) BY MOUTH DAILY. 01/04/20 01/03/21  Angiulli, Mcarthur Rossetti, PA-C  benzocaine (ORAJEL) 10 % mucosal gel Use as directed in the mouth or throat 3 (three) times daily. 01/04/20   Angiulli, Mcarthur Rossetti, PA-C  Multiple Vitamin (MULTIVITAMIN WITH MINERALS) TABS tablet Take 1 tablet by mouth daily. 12/14/19   Amin, Loura Halt, MD  nicotine (NICODERM CQ - DOSED IN MG/24 HOURS) 14 mg/24hr patch 14 mg patch daily x1 week then 7 mg patch daily x3 weeks and stop 01/04/20   Angiulli, Mcarthur Rossetti, PA-C  pantoprazole (PROTONIX) 40 MG tablet TAKE 1 TABLET (40 MG TOTAL) BY MOUTH DAILY BEFORE BREAKFAST. 01/04/20 01/03/21  Angiulli, Mcarthur Rossetti, PA-C  polyethylene glycol (MIRALAX /  GLYCOLAX) 17 g packet Take 17 g by mouth daily. 01/04/20   Angiulli, Mcarthur Rossetti, PA-C  senna-docusate (SENOKOT-S) 8.6-50 MG tablet Take 2 tablets by mouth at bedtime as needed for moderate constipation. 12/14/19   Amin, Loura Halt, MD  topiramate (TOPAMAX) 25 MG tablet TAKE 1 TABLET (25 MG TOTAL) BY MOUTH AT BEDTIME. 01/04/20 01/03/21  Angiulli, Mcarthur Rossetti, PA-C      Allergies    Patient has no known allergies.    Review of Systems   Review of Systems A 10 point review of systems was performed and is negative unless otherwise reported in HPI.  Physical Exam Updated Vital Signs Wt 70.6 kg   BMI 28.47 kg/m  Physical Exam General: Normal appearing {Desc; female/female:11659}, lying in bed.  HEENT: PERRLA, Sclera anicteric, MMM, trachea midline.  Cardiology: RRR, no murmurs/rubs/gallops. BL radial and DP pulses equal bilaterally.  Resp: Normal respiratory rate and effort. CTAB, no wheezes, rhonchi, crackles.  Abd: Soft, non-tender, non-distended. No rebound tenderness or guarding.  GU: Deferred. MSK: No peripheral edema or signs of trauma. Extremities without deformity or TTP. No cyanosis or clubbing. Skin: warm, dry. No rashes or lesions. Back: No CVA tenderness Neuro: A&Ox4, CNs II-XII grossly intact. MAEs. Sensation grossly intact.  Psych: Normal mood and affect.   ED Results / Procedures / Treatments   Labs (all labs ordered are listed, but only abnormal results are displayed) Labs Reviewed  I-STAT CHEM 8, ED - Abnormal; Notable  for the following components:      Result Value   Potassium 3.4 (*)    Glucose, Bld 118 (*)    Calcium, Ion 0.93 (*)    TCO2 19 (*)    All other components within normal limits  CBG MONITORING, ED - Abnormal; Notable for the following components:   Glucose-Capillary 103 (*)    All other components within normal limits    EKG None  Radiology No results found.  Procedures Procedures  {Document cardiac monitor, telemetry assessment procedure  when appropriate:1}  Medications Ordered in ED Medications  sodium chloride flush (NS) 0.9 % injection 3 mL (has no administration in time range)    ED Course/ Medical Decision Making/ A&P                          Medical Decision Making Amount and/or Complexity of Data Reviewed Radiology: ordered.    This patient presents to the ED for concern of ***, this involves an extensive number of treatment options, and is a complaint that carries with it a high risk of complications and morbidity.  I considered the following differential and admission for this acute, potentially life threatening condition.   MDM:    ***     Labs: I Ordered, and personally interpreted labs.  The pertinent results include:  ***  Imaging Studies ordered: I ordered imaging studies including *** I independently visualized and interpreted imaging. I agree with the radiologist interpretation  Additional history obtained from ***.  External records from outside source obtained and reviewed including ***  Cardiac Monitoring: The patient was maintained on a cardiac monitor.  I personally viewed and interpreted the cardiac monitored which showed an underlying rhythm of: ***  Reevaluation: After the interventions noted above, I reevaluated the patient and found that they have :{resolved/improved/worsened:23923::"improved"}  Social Determinants of Health: ***  Disposition:  ***  Co morbidities that complicate the patient evaluation  Past Medical History:  Diagnosis Date   Alcohol dependence (HCC)    H/O brain tumor    Smoker      Medicines Meds ordered this encounter  Medications   sodium chloride flush (NS) 0.9 % injection 3 mL    I have reviewed the patients home medicines and have made adjustments as needed  Problem List / ED Course: Problem List Items Addressed This Visit   None        {Document critical care time when appropriate:1} {Document review of labs and clinical decision  tools ie heart score, Chads2Vasc2 etc:1}  {Document your independent review of radiology images, and any outside records:1} {Document your discussion with family members, caretakers, and with consultants:1} {Document social determinants of health affecting pt's care:1} {Document your decision making why or why not admission, treatments were needed:1}  This note was created using dictation software, which may contain spelling or grammatical errors.

## 2022-07-27 NOTE — Consult Note (Signed)
NEURO HOSPITALIST CONSULT NOTE   Requestig physician: Dr. Jearld Fenton  Reason for Consult: Acute onset of worsened right sided weakness and confusion  History obtained from:  EMS, Patient and Chart     HPI:                                                                                                                                          Melissa Colon is an 62 y.o. 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 LKN was 1230. Symptoms were recognized by family at 63. EMS was called and Code Stroke was called in the field. Vitals per EMS: 201/117, HR 96, CBG 138, 96% on RA.   Past Medical History:  Diagnosis Date   Alcohol dependence (HCC)    H/O brain tumor    Smoker     Past Surgical History:  Procedure Laterality Date   CRANIOTOMY FOR TUMOR      Family History  Problem Relation Age of Onset   Diabetes Mother               Social History:  reports that she has been smoking cigarettes. She has never used smokeless tobacco. She reports current alcohol use of about 8.0 standard drinks of alcohol per week. She reports that she does not currently use drugs.  No Known Allergies  MEDICATIONS:                                                                                                                      No current facility-administered medications on file prior to encounter.   Current Outpatient Medications on File Prior to Encounter  Medication Sig Dispense Refill   acetaminophen (TYLENOL) 325 MG tablet Take 2 tablets (650 mg total) by mouth every 4 (four) hours as needed for mild pain (or temp > 37.5 C (99.5 F)).     amLODipine (NORVASC) 2.5 MG tablet TAKE 1 TABLET (2.5 MG  TOTAL) BY MOUTH DAILY. 30 tablet 0   atorvastatin (LIPITOR) 40 MG tablet TAKE  1 TABLET (40 MG TOTAL) BY MOUTH DAILY. 30 tablet 11   benzocaine (ORAJEL) 10 % mucosal gel Use as directed in the mouth or throat 3 (three) times daily. 5.3 g 0   Multiple Vitamin (MULTIVITAMIN WITH MINERALS) TABS tablet Take 1 tablet by mouth daily.     nicotine (NICODERM CQ - DOSED IN MG/24 HOURS) 14 mg/24hr patch 14 mg patch daily x1 week then 7 mg patch daily x3 weeks and stop 28 patch 0   pantoprazole (PROTONIX) 40 MG tablet TAKE 1 TABLET (40 MG TOTAL) BY MOUTH DAILY BEFORE BREAKFAST. 30 tablet 0   polyethylene glycol (MIRALAX / GLYCOLAX) 17 g packet Take 17 g by mouth daily. 14 each 0   senna-docusate (SENOKOT-S) 8.6-50 MG tablet Take 2 tablets by mouth at bedtime as needed for moderate constipation.     topiramate (TOPAMAX) 25 MG tablet TAKE 1 TABLET (25 MG TOTAL) BY MOUTH AT BEDTIME. 30 tablet 0     ROS:                                                                                                                                       As per HPI. Detailed ROS deferred in the context of acuity of presentation and cognitive impairment resulting in slow responses to questions.   BP (!) 145/88   Pulse 83   Temp (!) 97.3 F (36.3 C) (Oral)   Resp 20   Ht 5\' 2"  (1.575 m)   Wt 70.6 kg   SpO2 95%   BMI 28.47 kg/m   Weight 70.6 kg.   General Examination:                                                                                                       Physical Exam  HEENT:  [place]/AT Lungs: Respirations unlabored Extremities: Warm and well perfused.   Neurological Examination Mental Status: Awake and alert. Oriented to self and age. Partially oriented to time. Can recall the year but not the day of the week. Stated "[place]" when asked for the city and could not recall the city when asked several times. Speech is dysarthric and sparse but fluent with intact naming and comprehension.   Cranial  Nerves: II: Visual fields intact bilaterally; there is intermittent extinction on the right with DSS. PERRL.  III,IV, VI: Mild exotropia. EOMI with saccadic pursuits noted. No nystagmus.   V: Temp sensation decreased on the right VII: Right facial droop VIII: Hearing intact to  conversation IX,X: No hoarseness or hypophonia XI: Symmetric XII: Midline tongue extension Motor: RUE 4-/5 with drift and chronically increased tone of wrist and finger flexors.  RLE 4-/5 with drift. LUE and LLE 5/5 Sensory: Decreased sensation to RUE and RLE Deep Tendon Reflexes: Normoactive x 4 with no definite asymmetry. Right toe upgoing, left toe downgoing Cerebellar: No ataxia with FNF bilaterally, but has difficulty on the right due to weakness Gait: Deferred   Lab Results: Basic Metabolic Panel: Recent Labs  Lab 07/27/22 1625  NA 138  K 3.4*  CL 108  GLUCOSE 118*  BUN 12  CREATININE 0.50    CBC: Recent Labs  Lab 07/27/22 1625  HGB 14.3  HCT 42.0    Cardiac Enzymes: No results for input(s): "CKTOTAL", "CKMB", "CKMBINDEX", "TROPONINI" in the last 168 hours.  Lipid Panel: No results for input(s): "CHOL", "TRIG", "HDL", "CHOLHDL", "VLDL", "LDLCALC" in the last 168 hours.  Imaging: CT HEAD CODE STROKE WO CONTRAST  Result Date: 07/27/2022 CLINICAL DATA:  Code stroke.  Neuro deficit, acute, stroke suspected EXAM: CT HEAD WITHOUT CONTRAST TECHNIQUE: Contiguous axial images were obtained from the base of the skull through the vertex without intravenous contrast. RADIATION DOSE REDUCTION: This exam was performed according to the departmental dose-optimization program which includes automated exposure control, adjustment of the mA and/or kV according to patient size and/or use of iterative reconstruction technique. COMPARISON:  CT head 09/13/2021. FINDINGS: Brain: No evidence of acute infarction, hemorrhage, hydrocephalus, extra-axial collection or mass lesion/mass effect. Similar areas of  dystrophic calcification in the cerebellum and occipital lobes. Similar remote left parietal and right cerebellar infarct. Vascular: No hyperdense vessel vessel. Skull: No acute fracture.  Prior right occipital cranioplasty. Sinuses/Orbits: No acute abnormality. Other: No mastoid effusions. ASPECTS [facility] Stroke Program Early CT Score) total score (0-10 with 10 being normal): 10. IMPRESSION: 1. No evidence of acute intracranial abnormality. ASPECTS is 10. 2. Chronic infarcts, similar. Code stroke imaging results were communicated on 07/27/2022 at 4:33 pm to provider Dr. Otelia Limes via secure text paging. Electronically Signed   By: Feliberto Harts M.D.   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  - Exam reveals right sided findings most consistent with chronic UMN weakness.  - CT head: No evidence of acute infarction, hemorrhage, hydrocephalus, extra-axial collection or mass lesion/mass effect. Compared to prior CT from August of 2023, there are similar areas of dystrophic calcification in the cerebellum and occipital lobes. Also with similar remote left parietal and right cerebellar infarct. Prior right occipital cranioplasty.  - CTA of head and neck: Per verbal discussion with Radiology, there is no acute LVO. Calcifications and stenoses are noted as well as chronic occlusion. Official report is pending.  - Not a TNK candidate dur to prior history of hemorrhagic stroke and brain tumor.  - Not a thrombectomy candidate due to most if not all deficits appearing to be chronic, as well as no acute LVO on CTA.  - Etiology for her acute worsening includes small acute stroke not visible on CT and  acute toxic/metabolic encephalopathy.   Recommendations: - MRI brain - TTE - Start ASA 81 mg po every day - Continue atorvastatin - Permissive HTN x 24  hours - Cardiac telemetry - PT/OT/Speech - NPO until passes swallow evaluation - Toxic/metabolic/infectious work up.  - UDS - EtOH level    Electronically signed: Dr. Caryl Pina 07/27/2022, 4:45 PM

## 2022-07-27 NOTE — H&P (Addendum)
History and Physical  Melissa Colon ZOX:[id number] DOB: 1960-06-12 DOA: 07/27/2022  Referring physician: Dr. Jearld Fenton, EDP  PCP: Pcp, No  Outpatient Specialists: Neurology Patient coming from: Home  Chief Complaint: Right-sided weakness and confusion.  HPI: Melissa Colon is a 62 y.o. female with medical history significant for left thalamic intracranial hemorrhage, residual right hemiparesis, hypertension, tobacco use, alcohol use disorder, who presented to [facility] via [facility] as a code stroke.  Code stroke was activated by EMS.  Reportedly family noted slurred speech and right facial droop.  The patient was complaining of pain in the back of her head.  Last known well around 1230.  Upon EMS arrival the patient was severely hypertensive with BP of 201/117.  In the ED, the patient was seen by neurology/stroke team.  Noncontrast head CT and CT angio head and neck were completed.  The patient was not a candidate for thrombolytic due to history of intracranial hemorrhage from brain tumors.  TRH was asked to admit for stroke workup.  Admitted to telemetry medical unit as observation status.  MRI brain revealed the following findings:  1. Acute infarct in the medial, inferior right occipital lobe. No evidence of acute hemorrhage. 2. Hemosiderin deposition and encephalomalacia in the left thalamus, consistent with sequela of the acute hemorrhage noted in 2021.  Ongoing permissive hypertension.  IV labetalol as needed with parameters.  Appreciate neurology's assistance.  ED Course: Temperature 98.2.  BP 135/72, pulse 85, respiratory rate 20, O2 saturation 95% on room air.  Lab studies notable for serum bicarb 19, glucose 112, anion gap 17, albumin 3.4.  WBC 11.7.  Review of Systems: Review of systems as noted in the HPI. All other systems reviewed and are negative.   Past Medical History:  Diagnosis Date   Alcohol dependence (HCC)    H/O brain tumor    Smoker     Past Surgical History:  Procedure Laterality Date   CRANIOTOMY FOR TUMOR      Social History:  reports that she has been smoking cigarettes. She has never used smokeless tobacco. She reports current alcohol use of about 8.0 standard drinks of alcohol per week. She reports that she does not currently use drugs.   No Known Allergies  Family History  Problem Relation Age of Onset   Diabetes Mother       Prior to Admission medications   Medication Sig Start Date End Date Taking? Authorizing Provider  acetaminophen (TYLENOL) 325 MG tablet Take 2 tablets (650 mg total) by mouth every 4 (four) hours as needed for mild pain (or temp > 37.5 C (99.5 F)). 12/14/19   Amin, Ankit Chirag, MD  amLODipine (NORVASC) 2.5 MG tablet TAKE 1 TABLET (2.5 MG TOTAL) BY MOUTH DAILY. 01/04/20 01/03/21  Angiulli, Mcarthur Rossetti, PA-C  atorvastatin (LIPITOR) 40 MG tablet TAKE 1 TABLET (40 MG TOTAL) BY MOUTH DAILY. 01/04/20 01/03/21  Angiulli, Mcarthur Rossetti, PA-C  benzocaine (ORAJEL) 10 % mucosal gel Use as directed in the mouth or throat 3 (three) times daily. 01/04/20   Angiulli, Mcarthur Rossetti, PA-C  Multiple Vitamin (MULTIVITAMIN WITH MINERALS) TABS tablet Take 1 tablet by mouth daily. 12/14/19   Amin, Loura Halt, MD  nicotine (NICODERM CQ - DOSED IN MG/24 HOURS) 14 mg/24hr patch 14 mg patch daily x1 week then 7 mg patch daily x3 weeks and stop 01/04/20   Angiulli, Mcarthur Rossetti, PA-C  pantoprazole (PROTONIX) 40 MG tablet TAKE 1 TABLET (40 MG TOTAL) BY MOUTH DAILY BEFORE BREAKFAST. 01/04/20 01/03/21  Angiulli, Mcarthur Rossetti, PA-C  polyethylene glycol (MIRALAX / GLYCOLAX) 17 g packet Take 17 g by mouth daily. 01/04/20   Angiulli, Mcarthur Rossetti, PA-C  senna-docusate (SENOKOT-S) 8.6-50 MG tablet Take 2 tablets by mouth at bedtime as needed for moderate constipation. 12/14/19   Dimple Nanas, MD  topiramate (TOPAMAX) 25 MG tablet TAKE 1 TABLET (25 MG TOTAL) BY MOUTH AT BEDTIME. 01/04/20 01/03/21  Angiulli, Mcarthur Rossetti, PA-C    Physical  Exam: BP (!) 141/100   Pulse 91   Temp (!) 97.3 F (36.3 C) (Oral)   Resp (!) 24   Ht 5\' 2"  (1.575 m)   Wt 70.6 kg   SpO2 94%   BMI 28.47 kg/m   General: 61 y.o. year-old female well developed well nourished in no acute distress.  Alert and oriented x3. Cardiovascular: Regular rate and rhythm with no rubs or gallops.  No thyromegaly or JVD noted.  No lower extremity edema. 2/4 pulses in all 4 extremities. Respiratory: Clear to auscultation with no wheezes or rales. Good inspiratory effort. Abdomen: Soft nontender nondistended with normal bowel sounds x4 quadrants. Muskuloskeletal: No cyanosis, clubbing or edema noted bilaterally Neuro: CN II-XII intact, right upper extremity and right lower extremity 3 out of 5 strength, sensation, reflexes Skin: No ulcerative lesions noted or rashes Psychiatry: Judgement and insight appear normal. Mood is appropriate for condition and setting          Labs on Admission:  Basic Metabolic Panel: Recent Labs  Lab 07/27/22 1625 07/27/22 1736  NA 138 137  K 3.4* 3.6  CL 108 101  CO2  --  19*  GLUCOSE 118* 112*  BUN 12 11  CREATININE 0.50 0.67  CALCIUM  --  8.6*  MG  --  1.9   Liver Function Tests: Recent Labs  Lab 07/27/22 1736  AST 26  ALT 30  ALKPHOS 109  BILITOT 0.4  PROT 7.1  ALBUMIN 3.4*   No results for input(s): "LIPASE", "AMYLASE" in the last 168 hours. No results for input(s): "AMMONIA" in the last 168 hours. CBC: Recent Labs  Lab 07/27/22 1625 07/27/22 1736  WBC  --  11.7*  NEUTROABS  --  8.5*  HGB 14.3 12.6  HCT 42.0 39.8  MCV  --  85.6  PLT  --  252   Cardiac Enzymes: No results for input(s): "CKTOTAL", "CKMB", "CKMBINDEX", "TROPONINI" in the last 168 hours.  BNP (last 3 results) No results for input(s): "BNP" in the last 8760 hours.  ProBNP (last 3 results) No results for input(s): "PROBNP" in the last 8760 hours.  CBG: Recent Labs  Lab 07/27/22 1618  GLUCAP 103*    Radiological Exams on  Admission: CT HEAD CODE STROKE WO CONTRAST  Result Date: 07/27/2022 CLINICAL DATA:  Code stroke.  Neuro deficit, acute, stroke suspected EXAM: CT HEAD WITHOUT CONTRAST TECHNIQUE: Contiguous axial images were obtained from the base of the skull through the vertex without intravenous contrast. RADIATION DOSE REDUCTION: This exam was performed according to the departmental dose-optimization program which includes automated exposure control, adjustment of the mA and/or kV according to patient size and/or use of iterative reconstruction technique. COMPARISON:  CT head 09/13/2021. FINDINGS: Brain: No evidence of acute infarction, hemorrhage, hydrocephalus, extra-axial collection or mass lesion/mass effect. Similar areas of dystrophic calcification in the cerebellum and occipital lobes. Similar remote left parietal and right cerebellar infarct. Vascular: No hyperdense vessel vessel. Skull: No acute fracture.  Prior right occipital cranioplasty. Sinuses/Orbits: No acute abnormality. Other: No mastoid  effusions. ASPECTS [facility] Stroke Program Early CT Score) total score (0-10 with 10 being normal): 10. IMPRESSION: 1. No evidence of acute intracranial abnormality. ASPECTS is 10. 2. Chronic infarcts, similar. Code stroke imaging results were communicated on 07/27/2022 at 4:33 pm to provider Dr. Otelia Limes via secure text paging. Electronically Signed   By: Feliberto Harts M.D.   On: 07/27/2022 16:33    EKG: I independently viewed the EKG done and my findings are as followed: Sinus rhythm rate of 96.  Nonspecific ST-T changes.  QTc 434.  Assessment/Plan Present on Admission: **None**  Principal Problem:   Right sided weakness  Acute infarct in the medial, and inferior right occipital lobe, seen on brain MRI, POA History of CVA Noncontrast brain MRI revealed acute infarct in the medial and inferior right occipital lobe No evidence of acute hemorrhage.  Hemosiderin deposition and encephalomalacia in the left  thalamus consistent with sequela of the acute hemorrhage noted in 2021. Aspirin 81 mg daily as recommended by neurology Frequent neuro checks Fasting lipid panel, A1c Echocardiogram with bubble study PT/OT/speech therapist evaluation High intensity statin, Lipitor 80 mg daily Permissive hypertension IV labetalol, treat SBP greater than 220 or DBP greater than 120 Monitor on telemetry Neurology/stroke team following.  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 Multifocal severe proximal P2 PCA stenosis with possible more distal left P2 PCA occlusion versus critical stenosis. Similar findings were present on prior MRA. 2. Critical stenosis versus occlusion of the distal right ACA (A4 segment). 3. Severe right P2 PCA, new since 2021. 4. Severe right M2 MCA branch stenosis. 5. Small/non dominant left intradural vertebral artery with severe stenosis and distal occlusion. 6. Moderate distal basilar artery stenosis. 7. Moderate paraclinoid ICA stenosis bilaterally. 8. Left vertebral artery is patent without significant stenosis. 9. Left vertebral artery is small throughout its course in the neck with suspected superimposed stenosis. Consider vascular surgery consultation in the morning.  Hyperlipidemia Last LDL on 12/12/2019 was 92 Goal LDL less than 70. Follow-up fasting lipid panel. High intensity statin, Lipitor 80 mg daily  Essential hypertension Ongoing permissive hypertension Continue to closely monitor vital signs  History of alcohol use disorder Denies any recent use of alcohol No evidence of acute alcohol withdrawal on exam Multivitamin, folic acid and thiamine supplements   Time: 75 minutes.   DVT prophylaxis: Subcu Lovenox daily  Code Status: Full code  Family Communication: None at  bedside  Disposition Plan: Admitted to telemetry medical unit  Consults called: Neurology/stroke team  Admission status: Observation status   Status is: Observation    Darlin Drop MD [facility] Pager [phone]  If 7PM-7AM, please contact night-coverage [email or web address] Password [facility]  07/27/2022, 8:34 PM

## 2022-07-27 NOTE — Code Documentation (Signed)
Stroke Response Nurse Documentation Code Documentation  Melissa Colon is a 62 y.o. female arriving to [facility]  via [facility] on 07/27/22 with past medical hx of L thalamic ICH, residual R hemiparesis 80% deficit, HTN, tobacco use, EtOH dependence. On No antithrombotic. Code stroke was activated by EMS.   Patient from home where she was LKW at 1230 and now complaining of slurred speech, right facial droop, headache in the back of her head. Family noticed these symptoms at 1450. Pt had a stroke in 2021 that left her with right sided weakness and sensory changes on the right side.  BP 201/117 with EMS.   Stroke team at the bedside on patient arrival. Labs drawn and patient cleared for CT by Dr. Jearld Fenton. Patient to CT with team. NIHSS 9, see documentation for details and code stroke times. Patient with disoriented, left hemianopia, right facial droop, right arm weakness, right leg weakness, right decreased sensation, dysarthria , and Sensory  neglect on exam. The following imaging was completed:  CT Head and CTA. Patient is not a candidate for IV Thrombolytic due to hx of ICH and brain tumors. Patient is not a candidate for IR due to no LVO suspected.   Care Plan: Q2 neuro checks, permissive HTN.   Bedside handoff with ED RN [place].    Modena Slater  Stroke Response RN [phone]

## 2022-07-28 ENCOUNTER — Observation Stay (HOSPITAL_BASED_OUTPATIENT_CLINIC_OR_DEPARTMENT_OTHER): Payer: Medicaid Other

## 2022-07-28 ENCOUNTER — Observation Stay (HOSPITAL_COMMUNITY): Payer: Medicaid Other

## 2022-07-28 DIAGNOSIS — I6611 Occlusion and stenosis of right anterior cerebral artery: Secondary | ICD-10-CM | POA: Diagnosis present

## 2022-07-28 DIAGNOSIS — I672 Cerebral atherosclerosis: Secondary | ICD-10-CM | POA: Diagnosis present

## 2022-07-28 DIAGNOSIS — R42 Dizziness and giddiness: Secondary | ICD-10-CM | POA: Diagnosis not present

## 2022-07-28 DIAGNOSIS — H538 Other visual disturbances: Secondary | ICD-10-CM | POA: Diagnosis not present

## 2022-07-28 DIAGNOSIS — Z8669 Personal history of other diseases of the nervous system and sense organs: Secondary | ICD-10-CM | POA: Diagnosis not present

## 2022-07-28 DIAGNOSIS — I639 Cerebral infarction, unspecified: Secondary | ICD-10-CM | POA: Diagnosis not present

## 2022-07-28 DIAGNOSIS — I6389 Other cerebral infarction: Secondary | ICD-10-CM

## 2022-07-28 DIAGNOSIS — K5900 Constipation, unspecified: Secondary | ICD-10-CM | POA: Diagnosis not present

## 2022-07-28 DIAGNOSIS — Z6828 Body mass index (BMI) 28.0-28.9, adult: Secondary | ICD-10-CM | POA: Diagnosis not present

## 2022-07-28 DIAGNOSIS — I69392 Facial weakness following cerebral infarction: Secondary | ICD-10-CM | POA: Diagnosis not present

## 2022-07-28 DIAGNOSIS — I69359 Hemiplegia and hemiparesis following cerebral infarction affecting unspecified side: Secondary | ICD-10-CM | POA: Diagnosis not present

## 2022-07-28 DIAGNOSIS — R29705 NIHSS score 5: Secondary | ICD-10-CM | POA: Diagnosis present

## 2022-07-28 DIAGNOSIS — R195 Other fecal abnormalities: Secondary | ICD-10-CM | POA: Diagnosis not present

## 2022-07-28 DIAGNOSIS — I6629 Occlusion and stenosis of unspecified posterior cerebral artery: Secondary | ICD-10-CM | POA: Diagnosis present

## 2022-07-28 DIAGNOSIS — G9389 Other specified disorders of brain: Secondary | ICD-10-CM | POA: Diagnosis present

## 2022-07-28 DIAGNOSIS — H532 Diplopia: Secondary | ICD-10-CM | POA: Diagnosis not present

## 2022-07-28 DIAGNOSIS — K254 Chronic or unspecified gastric ulcer with hemorrhage: Secondary | ICD-10-CM | POA: Diagnosis not present

## 2022-07-28 DIAGNOSIS — K209 Esophagitis, unspecified without bleeding: Secondary | ICD-10-CM | POA: Diagnosis not present

## 2022-07-28 DIAGNOSIS — E876 Hypokalemia: Secondary | ICD-10-CM | POA: Diagnosis not present

## 2022-07-28 DIAGNOSIS — F1020 Alcohol dependence, uncomplicated: Secondary | ICD-10-CM | POA: Diagnosis present

## 2022-07-28 DIAGNOSIS — F1090 Alcohol use, unspecified, uncomplicated: Secondary | ICD-10-CM | POA: Diagnosis not present

## 2022-07-28 DIAGNOSIS — K449 Diaphragmatic hernia without obstruction or gangrene: Secondary | ICD-10-CM | POA: Diagnosis not present

## 2022-07-28 DIAGNOSIS — E785 Hyperlipidemia, unspecified: Secondary | ICD-10-CM | POA: Diagnosis present

## 2022-07-28 DIAGNOSIS — I63511 Cerebral infarction due to unspecified occlusion or stenosis of right middle cerebral artery: Secondary | ICD-10-CM | POA: Diagnosis not present

## 2022-07-28 DIAGNOSIS — D649 Anemia, unspecified: Secondary | ICD-10-CM | POA: Diagnosis not present

## 2022-07-28 DIAGNOSIS — R531 Weakness: Secondary | ICD-10-CM | POA: Diagnosis not present

## 2022-07-28 DIAGNOSIS — Z91148 Patient's other noncompliance with medication regimen for other reason: Secondary | ICD-10-CM | POA: Diagnosis not present

## 2022-07-28 DIAGNOSIS — F17210 Nicotine dependence, cigarettes, uncomplicated: Secondary | ICD-10-CM | POA: Diagnosis present

## 2022-07-28 DIAGNOSIS — Z79899 Other long term (current) drug therapy: Secondary | ICD-10-CM | POA: Diagnosis not present

## 2022-07-28 DIAGNOSIS — R471 Dysarthria and anarthria: Secondary | ICD-10-CM | POA: Diagnosis present

## 2022-07-28 DIAGNOSIS — R252 Cramp and spasm: Secondary | ICD-10-CM | POA: Diagnosis not present

## 2022-07-28 DIAGNOSIS — I69351 Hemiplegia and hemiparesis following cerebral infarction affecting right dominant side: Secondary | ICD-10-CM | POA: Diagnosis not present

## 2022-07-28 DIAGNOSIS — R7989 Other specified abnormal findings of blood chemistry: Secondary | ICD-10-CM | POA: Diagnosis not present

## 2022-07-28 DIAGNOSIS — E663 Overweight: Secondary | ICD-10-CM | POA: Diagnosis present

## 2022-07-28 DIAGNOSIS — S90111A Contusion of right great toe without damage to nail, initial encounter: Secondary | ICD-10-CM | POA: Diagnosis not present

## 2022-07-28 DIAGNOSIS — K319 Disease of stomach and duodenum, unspecified: Secondary | ICD-10-CM | POA: Diagnosis not present

## 2022-07-28 DIAGNOSIS — Z923 Personal history of irradiation: Secondary | ICD-10-CM | POA: Diagnosis not present

## 2022-07-28 DIAGNOSIS — R29810 Facial weakness: Secondary | ICD-10-CM | POA: Diagnosis present

## 2022-07-28 DIAGNOSIS — D62 Acute posthemorrhagic anemia: Secondary | ICD-10-CM | POA: Diagnosis not present

## 2022-07-28 DIAGNOSIS — I739 Peripheral vascular disease, unspecified: Secondary | ICD-10-CM | POA: Diagnosis present

## 2022-07-28 DIAGNOSIS — Z833 Family history of diabetes mellitus: Secondary | ICD-10-CM | POA: Diagnosis not present

## 2022-07-28 DIAGNOSIS — I10 Essential (primary) hypertension: Secondary | ICD-10-CM | POA: Diagnosis not present

## 2022-07-28 DIAGNOSIS — I69251 Hemiplegia and hemiparesis following other nontraumatic intracranial hemorrhage affecting right dominant side: Secondary | ICD-10-CM | POA: Diagnosis not present

## 2022-07-28 LAB — CBC
HCT: 39.1 % (ref 36.0–46.0)
Hemoglobin: 13 g/dL (ref 12.0–15.0)
MCH: 28.4 pg (ref 26.0–34.0)
MCHC: 33.2 g/dL (ref 30.0–36.0)
MCV: 85.6 fL (ref 80.0–100.0)
Platelets: 243 10*3/uL (ref 150–400)
RBC: 4.57 MIL/uL (ref 3.87–5.11)
RDW: 12.5 % (ref 11.5–15.5)
WBC: 8.9 10*3/uL (ref 4.0–10.5)
nRBC: 0 % (ref 0.0–0.2)

## 2022-07-28 LAB — LIPID PANEL
Cholesterol: 184 mg/dL (ref 0–200)
HDL: 35 mg/dL — ABNORMAL LOW (ref >40)
LDL Cholesterol: 123 mg/dL — ABNORMAL HIGH (ref 0–99)
Total CHOL/HDL Ratio: 5.3 RATIO
Triglycerides: 130 mg/dL (ref <150)
VLDL: 26 mg/dL (ref 0–40)

## 2022-07-28 LAB — BASIC METABOLIC PANEL
Anion gap: 9 (ref 5–15)
BUN: 7 mg/dL (ref 6–20)
CO2: 21 mmol/L — ABNORMAL LOW (ref 22–32)
Calcium: 8.4 mg/dL — ABNORMAL LOW (ref 8.9–10.3)
Chloride: 106 mmol/L (ref 98–111)
Creatinine, Ser: 0.59 mg/dL (ref 0.44–1.00)
GFR, Estimated: >60 mL/min (ref >60)
Glucose, Bld: 102 mg/dL — ABNORMAL HIGH (ref 70–99)
Potassium: 3.3 mmol/L — ABNORMAL LOW (ref 3.5–5.1)
Sodium: 136 mmol/L (ref 135–145)

## 2022-07-28 LAB — ECHOCARDIOGRAM COMPLETE BUBBLE STUDY
Area-P 1/2: 4.21 cm2
S' Lateral: 2.3 cm

## 2022-07-28 LAB — HEMOGLOBIN A1C
Hgb A1c MFr Bld: 5.6 % (ref 4.8–5.6)
Mean Plasma Glucose: 114.02 mg/dL

## 2022-07-28 LAB — HIV ANTIBODY (ROUTINE TESTING W REFLEX): HIV Screen 4th Generation wRfx: NONREACTIVE

## 2022-07-28 LAB — PHOSPHORUS: Phosphorus: 3.6 mg/dL (ref 2.5–4.6)

## 2022-07-28 LAB — MAGNESIUM: Magnesium: 1.8 mg/dL (ref 1.7–2.4)

## 2022-07-28 MED ORDER — ATORVASTATIN CALCIUM 80 MG PO TABS
80.0000 mg | ORAL_TABLET | Freq: Every day | ORAL | Status: DC
  Administered 2022-07-28 – 2022-07-31 (×4): 80 mg via ORAL
  Filled 2022-07-28 (×4): qty 1

## 2022-07-28 MED ORDER — MELATONIN 5 MG PO TABS
5.0000 mg | ORAL_TABLET | Freq: Every evening | ORAL | Status: DC | PRN
  Filled 2022-07-28: qty 1

## 2022-07-28 MED ORDER — POLYETHYLENE GLYCOL 3350 17 G PO PACK: 17.0000 g | PACK | Freq: Every day | ORAL | Status: DC | PRN

## 2022-07-28 MED ORDER — FOLIC ACID 1 MG PO TABS
1.0000 mg | ORAL_TABLET | Freq: Every day | ORAL | Status: DC
  Administered 2022-07-28 – 2022-07-31 (×4): 1 mg via ORAL
  Filled 2022-07-28 (×4): qty 1

## 2022-07-28 MED ORDER — ASPIRIN 81 MG PO TBEC
81.0000 mg | DELAYED_RELEASE_TABLET | Freq: Every day | ORAL | Status: DC
  Administered 2022-07-28 – 2022-07-31 (×4): 81 mg via ORAL
  Filled 2022-07-28 (×4): qty 1

## 2022-07-28 MED ORDER — SODIUM CHLORIDE 0.9 % IV SOLN: INTRAVENOUS | Status: DC

## 2022-07-28 MED ORDER — POTASSIUM CHLORIDE CRYS ER 20 MEQ PO TBCR
40.0000 meq | EXTENDED_RELEASE_TABLET | Freq: Once | ORAL | Status: AC
  Administered 2022-07-28: 40 meq via ORAL
  Filled 2022-07-28: qty 2

## 2022-07-28 MED ORDER — ACETAMINOPHEN 325 MG PO TABS
650.0000 mg | ORAL_TABLET | Freq: Four times a day (QID) | ORAL | Status: DC | PRN
  Administered 2022-07-28: 650 mg via ORAL
  Filled 2022-07-28 (×2): qty 2

## 2022-07-28 MED ORDER — ADULT MULTIVITAMIN W/MINERALS CH
1.0000 | ORAL_TABLET | Freq: Every day | ORAL | Status: DC
  Administered 2022-07-28 – 2022-07-31 (×4): 1 via ORAL
  Filled 2022-07-28 (×4): qty 1

## 2022-07-28 MED ORDER — THIAMINE MONONITRATE 100 MG PO TABS
100.0000 mg | ORAL_TABLET | Freq: Every day | ORAL | Status: DC
  Administered 2022-07-28 – 2022-07-31 (×4): 100 mg via ORAL
  Filled 2022-07-28 (×4): qty 1

## 2022-07-28 MED ORDER — LABETALOL HCL 5 MG/ML IV SOLN: 5.0000 mg | INTRAVENOUS | Status: DC | PRN

## 2022-07-28 NOTE — ED Notes (Signed)
ED TO INPATIENT HANDOFF REPORT  ED Nurse Name and Phone #: Antony Odea  S Name/Age/Gender Melissa Colon 62 y.o. female Room/Bed: 038C/038C  Code Status   Code Status: Full Code  Home/SNF/Other Home Patient oriented to: self, place, and time Is this baseline? Yes   Triage Complete: Triage complete  Chief Complaint Right sided weakness [R53.1]  Triage Note Pt BIB EMS due to code stroke. LSN 1230. Pt having right sided facial droop, slurred speech, and aphasia. Hx of stroke and brain cancer. Axox4.    Allergies No Known Allergies  Level of Care/Admitting Diagnosis ED Disposition     ED Disposition  Admit   Condition  --   Comment  Hospital Area: [facility] [[id number]]  Level of Care: Telemetry Medical [104]  May place patient in observation at [facility] or [facility] if equivalent level of care is available:: No  Covid Evaluation: Asymptomatic - no recent exposure (last 10 days) testing not required  Diagnosis: Right sided weakness [161096]  Admitting Physician: Darlin Drop [[id number]]  Attending Physician: Darlin Drop [[id number]]          B Medical/Surgery History Past Medical History:  Diagnosis Date   Alcohol dependence (HCC)    H/O brain tumor    Smoker    Past Surgical History:  Procedure Laterality Date   CRANIOTOMY FOR TUMOR       A IV Location/Drains/Wounds Patient Lines/Drains/Airways Status     Active Line/Drains/Airways     Name Placement date Placement time Site Days   Peripheral IV 07/27/22 20 G Left Antecubital 07/27/22  1630  Antecubital  1   External Urinary Catheter 07/27/22  1715  --  1            Intake/Output Last 24 hours No intake or output data in the 24 hours ending 07/28/22 1438  Labs/Imaging Results for orders placed or performed during the hospital encounter of 07/27/22 (from the past 48 hour(s))  CBG monitoring, ED     Status: Abnormal   Collection Time: 07/27/22  4:18 PM  Result  Value Ref Range   Glucose-Capillary 103 (H) 70 - 99 mg/dL    Comment: Glucose reference range applies only to samples taken after fasting for at least 8 hours.  I-stat chem 8, ED     Status: Abnormal   Collection Time: 07/27/22  4:25 PM  Result Value Ref Range   Sodium 138 135 - 145 mmol/L   Potassium 3.4 (L) 3.5 - 5.1 mmol/L   Chloride 108 98 - 111 mmol/L   BUN 12 6 - 20 mg/dL   Creatinine, Ser 2.95 0.44 - 1.00 mg/dL   Glucose, Bld 621 (H) 70 - 99 mg/dL    Comment: Glucose reference range applies only to samples taken after fasting for at least 8 hours.   Calcium, Ion 0.93 (L) 1.15 - 1.40 mmol/L   TCO2 19 (L) 22 - 32 mmol/L   Hemoglobin 14.3 12.0 - 15.0 g/dL   HCT 30.8 65.7 - 84.6 %  CBC with Differential     Status: Abnormal   Collection Time: 07/27/22  5:36 PM  Result Value Ref Range   WBC 11.7 (H) 4.0 - 10.5 K/uL   RBC 4.65 3.87 - 5.11 MIL/uL   Hemoglobin 12.6 12.0 - 15.0 g/dL   HCT 96.2 95.2 - 84.1 %   MCV 85.6 80.0 - 100.0 fL   MCH 27.1 26.0 - 34.0 pg   MCHC 31.7 30.0 - 36.0  g/dL   RDW 16.1 09.6 - 04.5 %   Platelets 252 150 - 400 K/uL   nRBC 0.0 0.0 - 0.2 %   Neutrophils Relative % 73 %   Neutro Abs 8.5 (H) 1.7 - 7.7 K/uL   Lymphocytes Relative 19 %   Lymphs Abs 2.2 0.7 - 4.0 K/uL   Monocytes Relative 6 %   Monocytes Absolute 0.7 0.1 - 1.0 K/uL   Eosinophils Relative 1 %   Eosinophils Absolute 0.2 0.0 - 0.5 K/uL   Basophils Relative 1 %   Basophils Absolute 0.1 0.0 - 0.1 K/uL   Immature Granulocytes 0 %   Abs Immature Granulocytes 0.03 0.00 - 0.07 K/uL    Comment: Performed at [facility], [address], [place], [place] [address]  Comprehensive metabolic panel     Status: Abnormal   Collection Time: 07/27/22  5:36 PM  Result Value Ref Range   Sodium 137 135 - 145 mmol/L   Potassium 3.6 3.5 - 5.1 mmol/L   Chloride 101 98 - 111 mmol/L   CO2 19 (L) 22 - 32 mmol/L   Glucose, Bld 112 (H) 70 - 99 mg/dL    Comment: Glucose reference range applies only to  samples taken after fasting for at least 8 hours.   BUN 11 6 - 20 mg/dL   Creatinine, Ser 1.91 0.44 - 1.00 mg/dL   Calcium 8.6 (L) 8.9 - 10.3 mg/dL   Total Protein 7.1 6.5 - 8.1 g/dL   Albumin 3.4 (L) 3.5 - 5.0 g/dL   AST 26 15 - 41 U/L   ALT 30 0 - 44 U/L   Alkaline Phosphatase 109 38 - 126 U/L   Total Bilirubin 0.4 0.3 - 1.2 mg/dL   GFR, Estimated >47 >82 mL/min    Comment: (NOTE) Calculated using the CKD-EPI Creatinine Equation (2021)    Anion gap 17 (H) 5 - 15    Comment: Performed at [facility], [address], [place], [place] [address]  Magnesium     Status: None   Collection Time: 07/27/22  5:36 PM  Result Value Ref Range   Magnesium 1.9 1.7 - 2.4 mg/dL    Comment: Performed at [facility], [address], [place], [place] [address]  Protime-INR     Status: None   Collection Time: 07/27/22  5:36 PM  Result Value Ref Range   Prothrombin Time 13.5 11.4 - 15.2 seconds   INR 1.0 0.8 - 1.2    Comment: (NOTE) INR goal varies based on device and disease states. Performed at [facility], [address] [place], [place] [address]   APTT     Status: None   Collection Time: 07/27/22  5:36 PM  Result Value Ref Range   aPTT 29 24 - 36 seconds    Comment: Performed at [facility], [address] [place], [place] [address]  Ethanol     Status: None   Collection Time: 07/27/22  5:36 PM  Result Value Ref Range   Alcohol, Ethyl (B) <10 <10 mg/dL    Comment: (NOTE) Lowest detectable limit for serum alcohol is 10 mg/dL.  For medical purposes only. Performed at [facility], [address], [place], [place] [address]   Urinalysis, Routine w reflex microscopic -Urine, Clean Catch     Status: Abnormal   Collection Time: 07/27/22  9:27 PM  Result Value Ref Range   Color, Urine YELLOW YELLOW  APPearance CLEAR CLEAR   Specific Gravity, Urine >1.046 (H) 1.005 - 1.030   pH 6.0 5.0 - 8.0   Glucose, UA NEGATIVE NEGATIVE mg/dL   Hgb urine dipstick  NEGATIVE NEGATIVE   Bilirubin Urine NEGATIVE NEGATIVE   Ketones, ur NEGATIVE NEGATIVE mg/dL   Protein, ur NEGATIVE NEGATIVE mg/dL   Nitrite NEGATIVE NEGATIVE   Leukocytes,Ua TRACE (A) NEGATIVE   RBC / HPF 0-5 0 - 5 RBC/hpf   WBC, UA 0-5 0 - 5 WBC/hpf   Bacteria, UA RARE (A) NONE SEEN   Squamous Epithelial / HPF 0-5 0 - 5 /HPF    Comment: Performed at [facility], [address], [place], [place] [address]  Rapid urine drug screen (hospital performed)     Status: None   Collection Time: 07/27/22  9:27 PM  Result Value Ref Range   Opiates NONE DETECTED NONE DETECTED   Cocaine NONE DETECTED NONE DETECTED   Benzodiazepines NONE DETECTED NONE DETECTED   Amphetamines NONE DETECTED NONE DETECTED   Tetrahydrocannabinol NONE DETECTED NONE DETECTED   Barbiturates NONE DETECTED NONE DETECTED    Comment: (NOTE) DRUG SCREEN FOR MEDICAL PURPOSES ONLY.  IF CONFIRMATION IS NEEDED FOR ANY PURPOSE, NOTIFY LAB WITHIN 5 DAYS.  LOWEST DETECTABLE LIMITS FOR URINE DRUG SCREEN Drug Class                     Cutoff (ng/mL) Amphetamine and metabolites    1000 Barbiturate and metabolites    200 Benzodiazepine                 200 Opiates and metabolites        300 Cocaine and metabolites        300 THC                            50 Performed at [facility], [address], [place], [place] [address]   Lipid panel     Status: Abnormal   Collection Time: 07/28/22  5:23 AM  Result Value Ref Range   Cholesterol 184 0 - 200 mg/dL   Triglycerides 829 <562 mg/dL   HDL 35 (L) >13 mg/dL   Total CHOL/HDL Ratio 5.3 RATIO   VLDL 26 0 - 40 mg/dL   LDL Cholesterol 086 (H) 0 - 99 mg/dL    Comment:        Total Cholesterol/HDL:CHD Risk Coronary Heart Disease Risk Table                     Men   Women  1/2 Average Risk   3.4   3.3  Average Risk       5.0   4.4  2 X Average Risk   9.6   7.1  3 X Average Risk  23.4   11.0        Use the calculated Patient Ratio above and the CHD Risk  Table to determine the patient's CHD Risk.        ATP III CLASSIFICATION (LDL):  <100     mg/dL   Optimal  578-469  mg/dL   Near or Above                    Optimal  130-159  mg/dL   Borderline  629-528  mg/dL   High  >413     mg/dL   Very High Performed at [facility], [address], [place], [place] [address]   CBC     Status: None   Collection Time: 07/28/22  5:23 AM  Result Value Ref Range   WBC 8.9 4.0 - 10.5 K/uL   RBC 4.57 3.87 - 5.11 MIL/uL   Hemoglobin 13.0 12.0 - 15.0 g/dL   HCT 13.2 44.0 - 10.2 %   MCV 85.6 80.0 - 100.0 fL   MCH 28.4 26.0 - 34.0 pg   MCHC 33.2 30.0 - 36.0 g/dL   RDW 72.5 36.6 - 44.0 %   Platelets 243 150 - 400 K/uL   nRBC 0.0 0.0 - 0.2 %    Comment: Performed at [facility], [address], [place], [place] [address]  Hemoglobin A1c     Status: None   Collection Time: 07/28/22  5:23 AM  Result Value Ref Range   Hgb A1c MFr Bld 5.6 4.8 - 5.6 %    Comment: (NOTE) Pre diabetes:          5.7%-6.4%  Diabetes:              >6.4%  Glycemic control for   <7.0% adults with diabetes    Mean Plasma Glucose 114.02 mg/dL    Comment: Performed at [facility], [address], [place], [place] [address]  Magnesium     Status: None   Collection Time: 07/28/22  5:23 AM  Result Value Ref Range   Magnesium 1.8 1.7 - 2.4 mg/dL    Comment: Performed at [facility], [address], [place], [place] [address]  Phosphorus     Status: None   Collection Time: 07/28/22  5:23 AM  Result Value Ref Range   Phosphorus 3.6 2.5 - 4.6 mg/dL    Comment: Performed at [facility], [address] [place], [place] [address]  Basic metabolic panel     Status: Abnormal   Collection Time: 07/28/22  5:23 AM  Result Value Ref Range   Sodium 136 135 - 145 mmol/L   Potassium 3.3 (L) 3.5 - 5.1 mmol/L   Chloride 106 98 - 111 mmol/L   CO2 21 (L) 22 - 32 mmol/L   Glucose, Bld 102 (H) 70 - 99 mg/dL    Comment: Glucose reference range applies only  to samples taken after fasting for at least 8 hours.   BUN 7 6 - 20 mg/dL   Creatinine, Ser 1.88 0.44 - 1.00 mg/dL   Calcium 8.4 (L) 8.9 - 10.3 mg/dL   GFR, Estimated >41 >66 mL/min    Comment: (NOTE) Calculated using the CKD-EPI Creatinine Equation (2021)    Anion gap 9 5 - 15    Comment: Performed at [facility], [address] [place], [place] [address]  HIV Antibody (routine testing w rflx)     Status: None   Collection Time: 07/28/22  5:23 AM  Result Value Ref Range   HIV Screen 4th Generation wRfx Non Reactive Non Reactive    Comment: Performed at [facility], [address], [place], [place] [address]   ECHOCARDIOGRAM COMPLETE BUBBLE STUDY  Result Date: 07/28/2022    ECHOCARDIOGRAM REPORT   Patient Name:   RICKITA EARP Date of Exam: 07/28/2022 Medical Rec #:  [id number]         Height:       62.0 in Accession #:    [id number]        Weight:  155.6 lb Date of Birth:  02-Sep-1960         BSA:          1.718 m Patient Age:    61 years          BP:           139/72 mmHg Patient Gender: F                 HR:           77 bpm. Exam Location:  Inpatient Procedure: 2D Echo, Color Doppler, Cardiac Doppler and Saline Contrast Bubble            Study Indications:    Stroke  History:        Patient has prior history of Echocardiogram examinations, most                 recent 12/11/2019. Risk Factors:Current Smoker, Hypertension and                 Alcohol abuse.  Sonographer:    Delcie Roch RDCS Referring Phys: [id number] CAROLE N HALL IMPRESSIONS  1. Left ventricular ejection fraction, by estimation, is 65 to 70%. The left ventricle has normal function. The left ventricle has no regional wall motion abnormalities. Left ventricular diastolic parameters are consistent with Grade I diastolic dysfunction (impaired relaxation).  2. Right ventricular systolic function is normal. The right ventricular size is normal. There is normal pulmonary artery systolic pressure.  3. The mitral  valve is normal in structure. No evidence of mitral valve regurgitation. No evidence of mitral stenosis.  4. The aortic valve is normal in structure. There is mild calcification of the aortic valve. Aortic valve regurgitation is not visualized. No aortic stenosis is present.  5. The inferior vena cava is normal in size with greater than 50% respiratory variability, suggesting right atrial pressure of 3 mmHg. FINDINGS  Left Ventricle: Left ventricular ejection fraction, by estimation, is 65 to 70%. The left ventricle has normal function. The left ventricle has no regional wall motion abnormalities. The left ventricular internal cavity size was normal in size. There is  no left ventricular hypertrophy. Left ventricular diastolic parameters are consistent with Grade I diastolic dysfunction (impaired relaxation). Right Ventricle: The right ventricular size is normal. No increase in right ventricular wall thickness. Right ventricular systolic function is normal. There is normal pulmonary artery systolic pressure. The tricuspid regurgitant velocity is 2.33 m/s, and  with an assumed right atrial pressure of 3 mmHg, the estimated right ventricular systolic pressure is 24.7 mmHg. Left Atrium: Left atrial size was normal in size. Right Atrium: Right atrial size was normal in size. Pericardium: There is no evidence of pericardial effusion. Mitral Valve: The mitral valve is normal in structure. No evidence of mitral valve regurgitation. No evidence of mitral valve stenosis. Tricuspid Valve: The tricuspid valve is normal in structure. Tricuspid valve regurgitation is trivial. No evidence of tricuspid stenosis. Aortic Valve: The aortic valve is normal in structure. There is mild calcification of the aortic valve. Aortic valve regurgitation is not visualized. No aortic stenosis is present. Pulmonic Valve: The pulmonic valve was normal in structure. Pulmonic valve regurgitation is not visualized. No evidence of pulmonic stenosis.  Aorta: The aortic root is normal in size and structure. Venous: The inferior vena cava is normal in size with greater than 50% respiratory variability, suggesting right atrial pressure of 3 mmHg. IAS/Shunts: No atrial level shunt detected by color flow Doppler. Agitated saline contrast  was given intravenously to evaluate for intracardiac shunting.  LEFT VENTRICLE PLAX 2D LVIDd:         3.90 cm   Diastology LVIDs:         2.30 cm   LV e' medial:    6.64 cm/s LV PW:         0.80 cm   LV E/e' medial:  11.3 LV IVS:        0.80 cm   LV e' lateral:   8.49 cm/s LVOT diam:     1.80 cm   LV E/e' lateral: 8.8 LV SV:         54 LV SV Index:   32 LVOT Area:     2.54 cm  RIGHT VENTRICLE             IVC RV Basal diam:  2.00 cm     IVC diam: 1.00 cm RV S prime:     11.90 cm/s TAPSE (M-mode): 1.8 cm LEFT ATRIUM             Index        RIGHT ATRIUM          Index LA diam:        2.80 cm 1.63 cm/m   RA Area:     8.64 cm LA Vol (A2C):   17.2 ml 10.01 ml/m  RA Volume:   13.70 ml 7.97 ml/m LA Vol (A4C):   14.6 ml 8.50 ml/m LA Biplane Vol: 16.9 ml 9.83 ml/m  AORTIC VALVE LVOT Vmax:   107.00 cm/s LVOT Vmean:  72.400 cm/s LVOT VTI:    0.213 m  AORTA Ao Root diam: 3.10 cm Ao Asc diam:  3.10 cm MITRAL VALVE               TRICUSPID VALVE MV Area (PHT): 4.21 cm    TR Peak grad:   21.7 mmHg MV Decel Time: 180 msec    TR Vmax:        233.00 cm/s MV E velocity: 75.10 cm/s MV A velocity: 81.20 cm/s  SHUNTS MV E/A ratio:  0.92        Systemic VTI:  0.21 m                            Systemic Diam: 1.80 cm Arvilla Meres MD Electronically signed by Arvilla Meres MD Signature Date/Time: 07/28/2022/10:35:33 AM    Final    MR BRAIN WO CONTRAST  Result Date: 07/28/2022 CLINICAL DATA:  Headache, stroke suspected EXAM: MRI HEAD WITHOUT CONTRAST TECHNIQUE: Multiplanar, multiecho pulse sequences of the brain and surrounding structures were obtained without intravenous contrast. COMPARISON:  12/10/2019 MRI head, correlation is also made with  07/27/2022 CT head FINDINGS: Brain: Restricted diffusion with ADC correlate in the medial, inferior right occipital lobe (series 5, images 68-72). These areas are associated with mildly increased T2 hyperintense signal. No acute hemorrhage, mass, mass effect, or midline shift. No hydrocephalus or extra-axial collection. Partial empty sella. Normal craniocervical junction. Redemonstrated large area of hemosiderin deposition in the left thalamus, extending into the left cerebral peduncle consistent with sequela of the acute hemorrhage noted in 2021. Hemosiderin deposition flow more remote right cerebellar hemorrhage, with additional foci in left cerebellum. Additional foci of hemosiderin deposition in the bilateral occipital, parietal, and temporal lobes, as well as the left basal ganglia and pons. Disproportionate cerebral volume loss in the bilateral parietal lobes, with redemonstrated sequela of prior left  parietal infarct. Confluent T2 hyperintense signal in the periventricular white matter and pons, likely the sequela of moderate to severe chronic small vessel ischemic disease. Vascular: Partial loss of the left vertebral artery flow void, which is consistent with the stenosis seen on the same-day CTA. Otherwise patent proximal arterial flow voids. Skull and upper cervical spine: Normal marrow signal. Sinuses/Orbits: Clear paranasal sinuses. No acute finding in the orbits. Other: Trace fluid in the left mastoid air cells. IMPRESSION: 1. Acute infarct in the medial, inferior right occipital lobe. No evidence of acute hemorrhage. 2. Hemosiderin deposition and encephalomalacia in the left thalamus, consistent with sequela of the acute hemorrhage noted in 2021. These results were called by telephone at the time of interpretation on 07/28/2022 at 1:27 am to provider HALL, who verbally acknowledged these results. Electronically Signed   By: Wiliam Ke M.D.   On: 07/28/2022 01:39   CT ANGIO HEAD NECK W WO CM (CODE  STROKE)  Result Date: 07/27/2022 CLINICAL DATA:  Neuro deficit, acute, stroke suspected EXAM: CT ANGIOGRAPHY HEAD AND NECK WITH AND WITHOUT CONTRAST TECHNIQUE: Multidetector CT imaging of the head and neck was performed using the standard protocol during bolus administration of intravenous contrast. Multiplanar CT image reconstructions and MIPs were obtained to evaluate the vascular anatomy. Carotid stenosis measurements (when applicable) are obtained utilizing NASCET criteria, using the distal internal carotid diameter as the denominator. RADIATION DOSE REDUCTION: This exam was performed according to the departmental dose-optimization program which includes automated exposure control, adjustment of the mA and/or kV according to patient size and/or use of iterative reconstruction technique. CONTRAST:  75mL OMNIPAQUE IOHEXOL 350 MG/ML SOLN COMPARISON:  CT head from today.  MRA Head December 10, 2019. FINDINGS: CTA NECK FINDINGS Aortic arch: Great vessel origins are patent. Right carotid system: Atherosclerosis at the carotid bifurcation without greater than 50% stenosis. Left carotid system: Patent.  No significant stenosis. Vertebral arteries: Right dominant. Left vertebral artery is patent without significant stenosis. Left vertebral artery is small throughout its course with suspected superimposed stenosis. Skeleton: No acute abnormality on limited assessment. Other neck: No acute abnormality on limited assessment. Upper chest: Visualized lung apices are clear. Review of the MIP images confirms the above findings CTA HEAD FINDINGS Anterior circulation: Bilateral intracranial ICAs are patent with moderate paraclinoid ICA stenosis bilaterally. Bilateral MCAs and bilateral ACAs are patent proximally. Severe right M2 MCA branch stenosis. Critical stenosis versus occlusion of the distal right ACA (A4 segment). Posterior circulation: Small/non dominant left intradural vertebral artery with severe stenosis and distal  occlusion. Right intradural vertebral artery is patent. Basilar artery is patent with moderate distal stenosis. Multifocal severe proximal P2 PCA stenosis with possible more distal left P2 PCA occlusion versus critical stenosis. Similar findings were present on prior MRA. Severe right P2 PCA, new since 2021. Venous sinuses: As permitted by contrast timing, patent. Review of the MIP images confirms the above findings IMPRESSION: 1. Multifocal severe proximal P2 PCA stenosis with possible more distal left P2 PCA occlusion versus critical stenosis. Similar findings were present on prior MRA. 2. Critical stenosis versus occlusion of the distal right ACA (A4 segment). 3. Severe right P2 PCA, new since 2021. 4. Severe right M2 MCA branch stenosis. 5. Small/non dominant left intradural vertebral artery with severe stenosis and distal occlusion. 6. Moderate distal basilar artery stenosis. 7. Moderate paraclinoid ICA stenosis bilaterally. 8. Left vertebral artery is patent without significant stenosis. 9. Left vertebral artery is small throughout its course in the neck with suspected superimposed  stenosis. Preliminary imaging results were communicated on 07/27/2022 at 4:49 pm to provider Dr. Otelia Limes via telephone, who verbally acknowledged these results. Electronically Signed   By: Feliberto Harts M.D.   On: 07/27/2022 17:00   CT HEAD CODE STROKE WO CONTRAST  Result Date: 07/27/2022 CLINICAL DATA:  Code stroke.  Neuro deficit, acute, stroke suspected EXAM: CT HEAD WITHOUT CONTRAST TECHNIQUE: Contiguous axial images were obtained from the base of the skull through the vertex without intravenous contrast. RADIATION DOSE REDUCTION: This exam was performed according to the departmental dose-optimization program which includes automated exposure control, adjustment of the mA and/or kV according to patient size and/or use of iterative reconstruction technique. COMPARISON:  CT head 09/13/2021. FINDINGS: Brain: No evidence of  acute infarction, hemorrhage, hydrocephalus, extra-axial collection or mass lesion/mass effect. Similar areas of dystrophic calcification in the cerebellum and occipital lobes. Similar remote left parietal and right cerebellar infarct. Vascular: No hyperdense vessel vessel. Skull: No acute fracture.  Prior right occipital cranioplasty. Sinuses/Orbits: No acute abnormality. Other: No mastoid effusions. ASPECTS [facility] Stroke Program Early CT Score) total score (0-10 with 10 being normal): 10. IMPRESSION: 1. No evidence of acute intracranial abnormality. ASPECTS is 10. 2. Chronic infarcts, similar. Code stroke imaging results were communicated on 07/27/2022 at 4:33 pm to provider Dr. Otelia Limes via secure text paging. Electronically Signed   By: Feliberto Harts M.D.   On: 07/27/2022 16:33    Pending Labs Unresulted Labs (From admission, onward)     Start     Ordered   08/03/22 0500  Creatinine, serum  (enoxaparin (LOVENOX)    CrCl >/= 30 ml/min)  Weekly,   R     Comments: while on enoxaparin therapy    07/27/22 2032            Vitals/Pain Today's Vitals   07/28/22 0526 07/28/22 0800 07/28/22 1100 07/28/22 1144  BP:  126/75 (!) 152/83   Pulse:  (!) 106 68   Resp:  18 (!) 26   Temp: 98.2 F (36.8 C)   98.7 F (37.1 C)  TempSrc: Oral   Oral  SpO2:  96% 98%   Weight:      Height:      PainSc:        Isolation Precautions No active isolations  Medications Medications  enoxaparin (LOVENOX) injection 40 mg (40 mg Subcutaneous Given 07/27/22 2057)  prochlorperazine (COMPAZINE) injection 5 mg (has no administration in time range)  aspirin EC tablet 81 mg (81 mg Oral Given 07/28/22 0525)  labetalol (NORMODYNE) injection 5 mg (has no administration in time range)  0.9 %  sodium chloride infusion ( Intravenous New Bag/Given 07/28/22 0524)  atorvastatin (LIPITOR) tablet 80 mg (80 mg Oral Given 07/28/22 1047)  multivitamin with minerals tablet 1 tablet (1 tablet Oral Given 07/28/22 1047)   folic acid (FOLVITE) tablet 1 mg (1 mg Oral Given 07/28/22 1047)  thiamine (VITAMIN B1) tablet 100 mg (100 mg Oral Given 07/28/22 1047)  acetaminophen (TYLENOL) tablet 650 mg (650 mg Oral Given 07/28/22 0533)  melatonin tablet 5 mg (has no administration in time range)  polyethylene glycol (MIRALAX / GLYCOLAX) packet 17 g (has no administration in time range)  sodium chloride flush (NS) 0.9 % injection 3 mL (3 mLs Intravenous Given 07/27/22 1655)  iohexol (OMNIPAQUE) 350 MG/ML injection 75 mL (75 mLs Intravenous Contrast Given 07/27/22 1645)  potassium chloride SA (KLOR-CON M) CR tablet 40 mEq (40 mEq Oral Given 07/28/22 1047)    Mobility non-ambulatory  Focused Assessments Neuro Assessment Handoff:  Swallow screen pass? Yes    NIH Stroke Scale  Dizziness Present: No Headache Present: No Interval: Shift assessment Level of Consciousness (1a.)   : Alert, keenly responsive LOC Questions (1b. )   : Answers both questions correctly LOC Commands (1c. )   : Performs both tasks correctly Best Gaze (2. )  : Normal Visual (3. )  : Partial hemianopia Facial Palsy (4. )    : Normal symmetrical movements Motor Arm, Left (5a. )   : No drift Motor Arm, Right (5b. ) : Drift Motor Leg, Left (6a. )  : No drift Motor Leg, Right (6b. ) : Some effort against gravity Limb Ataxia (7. ): Present in one limb Sensory (8. )  : Normal, no sensory loss Best Language (9. )  : No aphasia Dysarthria (10. ): Normal Extinction/Inattention (11.)   : No Abnormality Complete NIHSS TOTAL: 5 Last date known well: 07/27/22 Last time known well: 1230 Neuro Assessment: Exceptions to WDL Neuro Checks:   Initial (07/27/22 1630)  Has TPA been given? No If patient is a Neuro Trauma and patient is going to OR before floor call report to 4N Charge nurse: [phone] or [phone]   R Recommendations: See Admitting Provider Note  Report given to:   Additional Notes: Pt is able to feed herself, took pills  whole, had to break K pill this am, able to voice needs, family at bedside, was seen by PT about 30-1 hour ago...continent

## 2022-07-28 NOTE — Evaluation (Signed)
Physical Therapy Evaluation Patient Details Name: Melissa Colon MRN: [id number] DOB: 1961/01/06 Today's Date: 07/28/2022  History of Present Illness  Pt is a 62 y/o presenting to the emergency room as a code stroke activated by EMS when family reported slurred speech and right facial droop.  EMS found her with blood pressure 201/117.  She does have history of intracranial hemorrhage.  Patient was not candidate for tPA. MRI 7/13 revealed acute infarct in medial inferior R occipital lobe with no evidence of acute hemorrhage, encephalomalacia in L thalamus consistent with acute hemorrhage in 2021. 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   Clinical Impression  Pt inconsistent and poor historian regarding PLOF. Post session, pt spouse confirms that pt has been requiring increased assist for ADL's and transfers from family members since '21 stroke. Today, pt with deficits in strength bilaterally, R>L, and decreased sensation to light touch RLE. Mod A for bed mobility to achieve seated EOB, with max A x2 person to attempt stand with inadequate weight shifting. Pt reporting diplopia and R sided visual deficits noted; states diplopia is improved with right eye occlusion. OT to further assess vision. Family reports they have had increasing difficulty taking care of the pt and is amenable to options for further therapy. Due to new acute deficits and age, suspect adequate progression. At this time, Patient will benefit from intensive inpatient follow up therapy, >3 hours/day to facilitate improvements in functional mobility, strength, transfers, and to decrease caregiver burden.       Assistance Recommended at Discharge    If plan is discharge home, recommend the following:  Can travel by private vehicle  Two people to help with walking and/or transfers;A lot of help with bathing/dressing/bathroom;Assistance  with cooking/housework;Direct supervision/assist for financial management;Assist for transportation;Help with stairs or ramp for entrance;Direct supervision/assist for medications management        Equipment Recommendations Other (comment) (TBD)  Recommendations for Other Services  Rehab consult    Functional Status Assessment Patient has had a recent decline in their functional status and demonstrates the ability to make significant improvements in function in a reasonable and predictable amount of time.     Precautions / Restrictions Precautions Precautions: Fall Restrictions Weight Bearing Restrictions: No      Mobility  Bed Mobility Overal bed mobility: Needs Assistance Bed Mobility: Supine to Sit, Sit to Supine     Supine to sit: Mod assist Sit to supine: Mod assist   General bed mobility comments: Therapist assist to achieve seated EOB due to stretcher bed not providing hand rail support. Min A to return RLE into bed for supine.    Transfers Overall transfer level: Needs assistance Equipment used: 2 person hand held assist Transfers: Sit to/from Stand Sit to Stand: Max assist, +2 physical assistance           General transfer comment: x2 person max A to scoot forward for feet on floor, slow to rise and presents with posterior lean, continues to lean on bed and maintains standing for 5 seconds before sitting back down.    Ambulation/Gait                  Stairs            Wheelchair Mobility     Tilt Bed    Modified Rankin (Stroke Patients Only) Modified Rankin (Stroke Patients Only) Pre-Morbid Rankin Score: Severe disability Modified Rankin: Severe disability  Balance Overall balance assessment: Mild deficits observed, not formally tested, Needs assistance Sitting-balance support: No upper extremity supported, Feet unsupported Sitting balance-Leahy Scale: Fair   Postural control: Posterior lean Standing balance support:  Bilateral upper extremity supported, During functional activity, Reliant on assistive device for balance Standing balance-Leahy Scale: Zero Standing balance comment: Leans against bed in static standing, unable to maintain balance without support and max A x2 person.                             Pertinent Vitals/Pain Pain Assessment Pain Assessment: No/denies pain    Home Living Family/patient expects to be discharged to:: Private residence Living Arrangements: Spouse/significant other Available Help at Discharge: Available 24 hours/day Type of Home: House Home Access: Stairs to enter Entrance Stairs-Rails: Lawyer of Steps: 4-5   Home Layout: One level Home Equipment: Cane - single Librarian, academic (2 wheels);BSC/3in1;Wheelchair - manual;Shower seat - built in      Prior Function               Mobility Comments: Pt reports being ambulatory within the house with assist provided by son/husband. After session, husband reports to Thereasa Parkin that pt has been sedentary on couch since '21 stroke, unable to ambulate and requires assist for tranfers ADLs Comments: requiring assist     Hand Dominance   Dominant Hand: Right    Extremity/Trunk Assessment   Upper Extremity Assessment Upper Extremity Assessment: Defer to OT evaluation    Lower Extremity Assessment Lower Extremity Assessment: RLE deficits/detail;LLE deficits/detail;Generalized weakness RLE Deficits / Details: MMT's collected: At least 2/5 for Hip Flexion, Knee Ext/Flexion, 3/5 for DF/PF. Decreased sensitivity to light touch through LE with less sensitivity more distal. RLE Sensation: decreased light touch RLE Coordination: decreased fine motor;decreased gross motor LLE Deficits / Details: MMT's collected: At least 3/5 for Hip Flexion, Knee Ext/Flex, DF and PF. Intact to light touch. LLE Sensation: WNL LLE Coordination: decreased fine motor       Communication    Communication: Expressive difficulties  Cognition Arousal/Alertness: Awake/alert Behavior During Therapy: WFL for tasks assessed/performed Overall Cognitive Status: Impaired/Different from baseline Area of Impairment: Problem solving, Awareness, Safety/judgement, Memory                     Memory: Decreased short-term memory   Safety/Judgement: Decreased awareness of safety, Decreased awareness of deficits Awareness: Intellectual Problem Solving: Slow processing, Decreased initiation, Requires verbal cues, Requires tactile cues, Difficulty sequencing General Comments: Pt with decreased awareness for deficits, reports higher level of functioning than her spouse reports.        General Comments      Exercises     Assessment/Plan    PT Assessment Patient needs continued PT services  PT Problem List Decreased strength;Decreased activity tolerance;Decreased mobility;Decreased balance;Decreased coordination;Decreased cognition;Decreased safety awareness;Impaired sensation       PT Treatment Interventions Functional mobility training;Therapeutic activities;Balance training;Neuromuscular re-education;Cognitive remediation    PT Goals (Current goals can be found in the Care Plan section)  Acute Rehab PT Goals Patient Stated Goal: go home PT Goal Formulation: With patient Time For Goal Achievement: 08/11/22 Potential to Achieve Goals: Fair    Frequency Min 3X/week     Co-evaluation               AM-PAC PT "6 Clicks" Mobility  Outcome Measure Help needed turning from your back to your side while in a flat bed without using bedrails?:  A Little Help needed moving from lying on your back to sitting on the side of a flat bed without using bedrails?: A Lot Help needed moving to and from a bed to a chair (including a wheelchair)?: A Lot Help needed standing up from a chair using your arms (e.g., wheelchair or bedside chair)?: A Lot Help needed to walk in hospital room?:  Total Help needed climbing 3-5 steps with a railing? : Total 6 Click Score: 11    End of Session Equipment Utilized During Treatment: Gait belt Activity Tolerance: Patient tolerated treatment well Patient left: in bed;with call bell/phone within reach;with family/visitor present Nurse Communication: Mobility status PT Visit Diagnosis: Other symptoms and signs involving the nervous system (R29.898);Difficulty in walking, not elsewhere classified (R26.2);Muscle weakness (generalized) (M62.81);Other abnormalities of gait and mobility (R26.89);Unsteadiness on feet (R26.81);Hemiplegia and hemiparesis Hemiplegia - Right/Left: Right Hemiplegia - dominant/non-dominant: Dominant Hemiplegia - caused by: Cerebral infarction    Time: 1345-1411 PT Time Calculation (min) (ACUTE ONLY): 26 min   Charges:   PT Evaluation $PT Eval Moderate Complexity: 1 Mod PT Treatments $Therapeutic Activity: 8-22 mins PT General Charges $$ ACUTE PT VISIT: 1 Visit         Hendricks Milo, SPT  Acute Rehabilitation Services   Hendricks Milo 07/28/2022, 4:07 PM

## 2022-07-28 NOTE — Evaluation (Signed)
Occupational Therapy Evaluation Patient Details Name: Melissa Colon MRN: [id number] DOB: [birth year] Today's Date: 07/28/2022   History of Present Illness Pt is a 62 y/o presenting to the emergency room as a code stroke activated by EMS when family reported slurred speech and right facial droop.  EMS found her with blood pressure 201/117.  She does have history of intracranial hemorrhage.  Patient was not candidate for tPA. MRI 7/13 revealed acute infarct in medial inferior R occipital lobe with no evidence of acute hemorrhage, encephalomalacia in L thalamus consistent with acute hemorrhage in 2021. 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   Clinical Impression   Pt currently at max assist to max +2 for simulated LB selfcare sit to stand and simulated stand step transfers at EOB.  Decreased functional use of the RLE and RUE noted throughout session.  Pt's family reports that she was primarily using the wheelchair at home with assist for transferring in and out of bed as well as for toileting and B/D tasks.  Pt with diplopia more in the right visual field but also present some in the left.  This was intermittent as she did exhibit some single vision initially in the left inferior field but this carried over across midline from that point during testing.  Family reports diplopia was present from previous CVA and pt was not able to specify if it's worse or new.  Feel pt will benefit from acute care OT at this time in order to decreased burden of care on her son, who provides hands on physical assist for transfers, toileting, and selfcare.  Recommend short term  intensive inpatient follow up therapy, >3 hours/day prior to discharge home.         Recommendations for follow up therapy are one component of a multi-disciplinary discharge planning process, led by the attending physician.  Recommendations may be  updated based on patient status, additional functional criteria and insurance authorization.   Assistance Recommended at Discharge Frequent or constant Supervision/Assistance  Patient can return home with the following A lot of help with walking and/or transfers;A lot of help with bathing/dressing/bathroom;Assist for transportation;Help with stairs or ramp for entrance;Direct supervision/assist for medications management;Assistance with cooking/housework;Direct supervision/assist for financial management    Functional Status Assessment  Patient has had a recent decline in their functional status and demonstrates the ability to make significant improvements in function in a reasonable and predictable amount of time.  Equipment Recommendations  Other (comment) (TBD next venue of care)       Precautions / Restrictions Precautions Precautions: Fall Precaution Comments: right UE and LE hemiparesis, intermittent diplopia Restrictions Weight Bearing Restrictions: No      Mobility Bed Mobility Overal bed mobility: Needs Assistance Bed Mobility: Supine to Sit, Sit to Supine     Supine to sit: Mod assist Sit to supine: Mod assist   General bed mobility comments: Assist to bring trunk and LEs off of the bed and for sitting up.  Assist to slowly come down to the right side of the bed with her trunk and bring LEs back up.    Transfers Overall transfer level: Needs assistance Equipment used: None Transfers: Sit to/from Stand Sit to Stand: Max assist           General transfer comment: Max assist +2 for stepping up the side of the bed and forward.      Balance Overall balance assessment: Needs assistance Sitting-balance  support: No upper extremity supported, Feet unsupported Sitting balance-Leahy Scale: Fair Sitting balance - Comments: Able to sit statically edge of stretcher without LOB Postural control: Right lateral lean, Posterior lean Standing balance support: During  functional activity, Single extremity supported Standing balance-Leahy Scale: Zero Standing balance comment: Increased right lean and LOB with max assist needed to maintain                           ADL either performed or assessed with clinical judgement   ADL Overall ADL's : Needs assistance/impaired Eating/Feeding: Supervision/ safety;Sitting Eating/Feeding Details (indicate cue type and reason): simulated Grooming: Wash/dry hands;Wash/dry face;Sitting;Min guard Grooming Details (indicate cue type and reason): simulated Upper Body Bathing: Minimal assistance;Sitting Upper Body Bathing Details (indicate cue type and reason): simulated Lower Body Bathing: +2 for physical assistance;Moderate assistance;Sit to/from stand Lower Body Bathing Details (indicate cue type and reason): simulated Upper Body Dressing : Minimal assistance;Sitting Upper Body Dressing Details (indicate cue type and reason): simulated Lower Body Dressing: Maximal assistance;+2 for physical assistance;Sit to/from stand   Toilet Transfer: Maximal assistance;Stand-pivot Toilet Transfer Details (indicate cue type and reason): simulated stepping forward, backwards and up the EOB Toileting- Clothing Manipulation and Hygiene: +2 for physical assistance;Moderate assistance;Sit to/from stand Toileting - Clothing Manipulation Details (indicate cue type and reason): simulated     Functional mobility during ADLs: Moderate assistance;+2 for safety/equipment;Total assistance General ADL Comments: Pt's son and spouse present for session.  Pt's son is there at home all the time and provides assist with all transfers stand/squat pivot.  He also assists with toileting and selfcare tasks.  In standing, pt with increased lean to the right at EOB and increased pushing and resistance to weight shift to the left.  Max assist to advance the LLE out to the side for stepping up the EOB.     Vision Baseline Vision/History:   (intermittent diplopia) Ability to See in Adequate Light: 1 Impaired Patient Visual Report: Diplopia Vision Assessment?: Yes Eye Alignment: Within Functional Limits Ocular Range of Motion: Within Functional Limits Alignment/Gaze Preference: Within Defined Limits Tracking/Visual Pursuits: Decreased smoothness of horizontal tracking;Decreased smoothness of vertical tracking Visual Fields:  (not tested) Diplopia Assessment: Objects split side to side;Other (comment) Additional Comments: Initially diplopia was present near and far in the right visual field and the middle to upper left visual field. She reported single vision in the lower left field. When scanning from single vision field on the left to superior field and across midline to the right, she reported single vision.  Talking with pt, spouse, and son, pt has had intermittent diplopia since last CVA.     Perception  WFL   Praxis  Impaired       Hand Dominance Right   Extremity/Trunk Assessment Upper Extremity Assessment Upper Extremity Assessment: RUE deficits/detail RUE Deficits / Details: History of RUE hemiparesis from previous CVA.  Able to exhibit Brunnstrum stage V movement in the arm and stage VI in the hand.  Slight synergy pattern with shoulder flexion with slight increased tone in the wrist and digit flexors but she is able to activate extensors to help counter it.  Posterior upper arm shoulder pain with AAROM greater than 120 degrees. RUE Sensation: decreased light touch;decreased proprioception RUE Coordination: decreased fine motor;decreased gross motor   Lower Extremity Assessment Lower Extremity Assessment: Defer to PT evaluation RLE Deficits / Details: MMT's collected: At least 2/5 for Hip Flexion, Knee Ext/Flexion, 3/5 for DF/PF. Decreased sensitivity  to light touch through LE with less sensitivity more distal. RLE Sensation: decreased light touch RLE Coordination: decreased fine motor;decreased gross motor LLE  Deficits / Details: MMT's collected: At least 3/5 for Hip Flexion, Knee Ext/Flex, DF and PF. Intact to light touch. LLE Sensation: WNL LLE Coordination: decreased fine motor       Communication Communication Communication: Expressive difficulties   Cognition Arousal/Alertness: Awake/alert Behavior During Therapy: WFL for tasks assessed/performed Overall Cognitive Status: Impaired/Different from baseline Area of Impairment: Problem solving, Awareness, Safety/judgement, Memory                     Memory: Decreased short-term memory   Safety/Judgement: Decreased awareness of safety, Decreased awareness of deficits Awareness: Intellectual Problem Solving: Slow processing, Decreased initiation, Requires verbal cues, Requires tactile cues, Difficulty sequencing General Comments: Pt with slower processing of questions when asked.  Unable to provide accurate PLOF information which was corrected by her son.  Decreased awareness of deficits when asked what was different with this CVA.                Home Living Family/patient expects to be discharged to:: Private residence Living Arrangements: Spouse/significant other;Children Available Help at Discharge: Available 24 hours/day Type of Home: House Home Access: Stairs to enter [employer] of Steps: 4-5 Entrance Stairs-Rails: Left;Right Home Layout: One level     Bathroom Shower/Tub: Producer, television/film/video: Standard     Home Equipment: Cane - single Librarian, academic (2 wheels);BSC/3in1;Wheelchair - Manufacturing systems engineer - built in          Prior Functioning/Environment               Mobility Comments: Pt reports being ambulatory within the house with assist provided by son/husband. After session, husband reports to Thereasa Parkin that pt has been sedentary on couch since '21 stroke, unable to ambulate and requires assist for tranfers ADLs Comments: requiring assist        OT Problem List:  Decreased strength;Impaired vision/perception;Decreased knowledge of use of DME or AE;Impaired tone;Decreased coordination;Decreased range of motion;Decreased cognition;Impaired UE functional use;Decreased safety awareness;Impaired balance (sitting and/or standing);Pain      OT Treatment/Interventions: Self-care/ADL training;Patient/family education;Therapeutic exercise;Balance training;Neuromuscular education;Therapeutic activities;DME and/or AE instruction;Cognitive remediation/compensation    OT Goals(Current goals can be found in the care plan section) Acute Rehab OT Goals Patient Stated Goal: Pt wants to go home soon OT Goal Formulation: With patient/family Time For Goal Achievement: 08/11/22 Potential to Achieve Goals: Good  OT Frequency: Min 1X/week       AM-PAC OT "6 Clicks" Daily Activity     Outcome Measure Help from another person eating meals?: A Little Help from another person taking care of personal grooming?: A Little Help from another person toileting, which includes using toliet, bedpan, or urinal?: Total Help from another person bathing (including washing, rinsing, drying)?: Total Help from another person to put on and taking off regular upper body clothing?: A Little Help from another person to put on and taking off regular lower body clothing?: Total 6 Click Score: 12   End of Session Nurse Communication: Mobility status  Activity Tolerance: Patient tolerated treatment well Patient left: in bed;with call bell/phone within reach;with family/visitor present  OT Visit Diagnosis: Unsteadiness on feet (R26.81);Other abnormalities of gait and mobility (R26.89);Muscle weakness (generalized) (M62.81);Hemiplegia and hemiparesis;Other symptoms and signs involving cognitive function;Pain Hemiplegia - Right/Left: Right Hemiplegia - dominant/non-dominant: Dominant Hemiplegia - caused by: Cerebral infarction Pain - Right/Left: Right Pain - part  of body: Shoulder                 Time: 1425-1509 OT Time Calculation (min): 44 min Charges:  OT General Charges $OT Visit: 1 Visit OT Treatments $Self Care/Home Management : 23-37 mins  Perrin Maltese, OTR/L Acute Rehabilitation Services  Office [phone] 07/28/2022

## 2022-07-28 NOTE — ED Notes (Signed)
Patient left the floor in stable condition, with her belongings and family.

## 2022-07-28 NOTE — ED Notes (Signed)
Patient was resting in bed with with eyes closed, advised she was going for an ECHO, pt transported in stable condition with staff.

## 2022-07-28 NOTE — ED Notes (Signed)
Sat patient up in bed to eat her breakfast patient has call bell in reach

## 2022-07-28 NOTE — Progress Notes (Addendum)
STROKE TEAM PROGRESS NOTE   INTERVAL HISTORY Family at the bedside.  Patient states she had a headache yesterday and her brother-in-law brought her into the hospital MRI positive for right occipital lobe infarct.  She complains of diplopia  Vitals:   07/28/22 0526 07/28/22 0800 07/28/22 1100 07/28/22 1144  BP:  126/75 (!) 152/83   Pulse:  (!) 106 68   Resp:  18 (!) 26   Temp: 98.2 F (36.8 C)   98.7 F (37.1 C)  TempSrc: Oral   Oral  SpO2:  96% 98%   Weight:      Height:       CBC:  Recent Labs  Lab 07/27/22 1736 07/28/22 0523  WBC 11.7* 8.9  NEUTROABS 8.5*  --   HGB 12.6 13.0  HCT 39.8 39.1  MCV 85.6 85.6  PLT 252 243   Basic Metabolic Panel:  Recent Labs  Lab 07/27/22 1736 07/28/22 0523  NA 137 136  K 3.6 3.3*  CL 101 106  CO2 19* 21*  GLUCOSE 112* 102*  BUN 11 7  CREATININE 0.67 0.59  CALCIUM 8.6* 8.4*  MG 1.9 1.8  PHOS  --  3.6   Lipid Panel:  Recent Labs  Lab 07/28/22 0523  CHOL 184  TRIG 130  HDL 35*  CHOLHDL 5.3  VLDL 26  LDLCALC 098*   HgbA1c:  Recent Labs  Lab 07/28/22 0523  HGBA1C 5.6   Urine Drug Screen:  Recent Labs  Lab 07/27/22 2127  LABOPIA NONE DETECTED  COCAINSCRNUR NONE DETECTED  LABBENZ NONE DETECTED  AMPHETMU NONE DETECTED  THCU NONE DETECTED  LABBARB NONE DETECTED    Alcohol Level  Recent Labs  Lab 07/27/22 1736  ETH <10    IMAGING past 24 hours ECHOCARDIOGRAM COMPLETE BUBBLE STUDY  Result Date: 07/28/2022    ECHOCARDIOGRAM REPORT   Patient Name:   Melissa Colon Date of Exam: 07/28/2022 Medical Rec #:  [id number]         Height:       62.0 in Accession #:    [id number]        Weight:       155.6 lb Date of Birth:  06-18-1960         BSA:          1.718 m Patient Age:    61 years          BP:           139/72 mmHg Patient Gender: F                 HR:           77 bpm. Exam Location:  Inpatient Procedure: 2D Echo, Color Doppler, Cardiac Doppler and Saline Contrast Bubble            Study Indications:    Stroke   History:        Patient has prior history of Echocardiogram examinations, most                 recent 12/11/2019. Risk Factors:Current Smoker, Hypertension and                 Alcohol abuse.  Sonographer:    Delcie Roch RDCS Referring Phys: [id number] CAROLE N HALL IMPRESSIONS  1. Left ventricular ejection fraction, by estimation, is 65 to 70%. The left ventricle has normal function. The left ventricle has no regional wall motion abnormalities. Left ventricular diastolic parameters are consistent with Grade I  diastolic dysfunction (impaired relaxation).  2. Right ventricular systolic function is normal. The right ventricular size is normal. There is normal pulmonary artery systolic pressure.  3. The mitral valve is normal in structure. No evidence of mitral valve regurgitation. No evidence of mitral stenosis.  4. The aortic valve is normal in structure. There is mild calcification of the aortic valve. Aortic valve regurgitation is not visualized. No aortic stenosis is present.  5. The inferior vena cava is normal in size with greater than 50% respiratory variability, suggesting right atrial pressure of 3 mmHg. FINDINGS  Left Ventricle: Left ventricular ejection fraction, by estimation, is 65 to 70%. The left ventricle has normal function. The left ventricle has no regional wall motion abnormalities. The left ventricular internal cavity size was normal in size. There is  no left ventricular hypertrophy. Left ventricular diastolic parameters are consistent with Grade I diastolic dysfunction (impaired relaxation). Right Ventricle: The right ventricular size is normal. No increase in right ventricular wall thickness. Right ventricular systolic function is normal. There is normal pulmonary artery systolic pressure. The tricuspid regurgitant velocity is 2.33 m/s, and  with an assumed right atrial pressure of 3 mmHg, the estimated right ventricular systolic pressure is 24.7 mmHg. Left Atrium: Left atrial size was normal  in size. Right Atrium: Right atrial size was normal in size. Pericardium: There is no evidence of pericardial effusion. Mitral Valve: The mitral valve is normal in structure. No evidence of mitral valve regurgitation. No evidence of mitral valve stenosis. Tricuspid Valve: The tricuspid valve is normal in structure. Tricuspid valve regurgitation is trivial. No evidence of tricuspid stenosis. Aortic Valve: The aortic valve is normal in structure. There is mild calcification of the aortic valve. Aortic valve regurgitation is not visualized. No aortic stenosis is present. Pulmonic Valve: The pulmonic valve was normal in structure. Pulmonic valve regurgitation is not visualized. No evidence of pulmonic stenosis. Aorta: The aortic root is normal in size and structure. Venous: The inferior vena cava is normal in size with greater than 50% respiratory variability, suggesting right atrial pressure of 3 mmHg. IAS/Shunts: No atrial level shunt detected by color flow Doppler. Agitated saline contrast was given intravenously to evaluate for intracardiac shunting.  LEFT VENTRICLE PLAX 2D LVIDd:         3.90 cm   Diastology LVIDs:         2.30 cm   LV e' medial:    6.64 cm/s LV PW:         0.80 cm   LV E/e' medial:  11.3 LV IVS:        0.80 cm   LV e' lateral:   8.49 cm/s LVOT diam:     1.80 cm   LV E/e' lateral: 8.8 LV SV:         54 LV SV Index:   32 LVOT Area:     2.54 cm  RIGHT VENTRICLE             IVC RV Basal diam:  2.00 cm     IVC diam: 1.00 cm RV S prime:     11.90 cm/s TAPSE (M-mode): 1.8 cm LEFT ATRIUM             Index        RIGHT ATRIUM          Index LA diam:        2.80 cm 1.63 cm/m   RA Area:     8.64 cm LA Vol (A2C):  17.2 ml 10.01 ml/m  RA Volume:   13.70 ml 7.97 ml/m LA Vol (A4C):   14.6 ml 8.50 ml/m LA Biplane Vol: 16.9 ml 9.83 ml/m  AORTIC VALVE LVOT Vmax:   107.00 cm/s LVOT Vmean:  72.400 cm/s LVOT VTI:    0.213 m  AORTA Ao Root diam: 3.10 cm Ao Asc diam:  3.10 cm MITRAL VALVE               TRICUSPID  VALVE MV Area (PHT): 4.21 cm    TR Peak grad:   21.7 mmHg MV Decel Time: 180 msec    TR Vmax:        233.00 cm/s MV E velocity: 75.10 cm/s MV A velocity: 81.20 cm/s  SHUNTS MV E/A ratio:  0.92        Systemic VTI:  0.21 m                            Systemic Diam: 1.80 cm Arvilla Meres MD Electronically signed by Arvilla Meres MD Signature Date/Time: 07/28/2022/10:35:33 AM    Final    MR BRAIN WO CONTRAST  Result Date: 07/28/2022 CLINICAL DATA:  Headache, stroke suspected EXAM: MRI HEAD WITHOUT CONTRAST TECHNIQUE: Multiplanar, multiecho pulse sequences of the brain and surrounding structures were obtained without intravenous contrast. COMPARISON:  12/10/2019 MRI head, correlation is also made with 07/27/2022 CT head FINDINGS: Brain: Restricted diffusion with ADC correlate in the medial, inferior right occipital lobe (series 5, images 68-72). These areas are associated with mildly increased T2 hyperintense signal. No acute hemorrhage, mass, mass effect, or midline shift. No hydrocephalus or extra-axial collection. Partial empty sella. Normal craniocervical junction. Redemonstrated large area of hemosiderin deposition in the left thalamus, extending into the left cerebral peduncle consistent with sequela of the acute hemorrhage noted in 2021. Hemosiderin deposition flow more remote right cerebellar hemorrhage, with additional foci in left cerebellum. Additional foci of hemosiderin deposition in the bilateral occipital, parietal, and temporal lobes, as well as the left basal ganglia and pons. Disproportionate cerebral volume loss in the bilateral parietal lobes, with redemonstrated sequela of prior left parietal infarct. Confluent T2 hyperintense signal in the periventricular white matter and pons, likely the sequela of moderate to severe chronic small vessel ischemic disease. Vascular: Partial loss of the left vertebral artery flow void, which is consistent with the stenosis seen on the same-day CTA.  Otherwise patent proximal arterial flow voids. Skull and upper cervical spine: Normal marrow signal. Sinuses/Orbits: Clear paranasal sinuses. No acute finding in the orbits. Other: Trace fluid in the left mastoid air cells. IMPRESSION: 1. Acute infarct in the medial, inferior right occipital lobe. No evidence of acute hemorrhage. 2. Hemosiderin deposition and encephalomalacia in the left thalamus, consistent with sequela of the acute hemorrhage noted in 2021. These results were called by telephone at the time of interpretation on 07/28/2022 at 1:27 am to provider HALL, who verbally acknowledged these results. Electronically Signed   By: Wiliam Ke M.D.   On: 07/28/2022 01:39   CT ANGIO HEAD NECK W WO CM (CODE STROKE)  Result Date: 07/27/2022 CLINICAL DATA:  Neuro deficit, acute, stroke suspected EXAM: CT ANGIOGRAPHY HEAD AND NECK WITH AND WITHOUT CONTRAST TECHNIQUE: Multidetector CT imaging of the head and neck was performed using the standard protocol during bolus administration of intravenous contrast. Multiplanar CT image reconstructions and MIPs were obtained to evaluate the vascular anatomy. Carotid stenosis measurements (when applicable) are obtained utilizing NASCET criteria, using  the distal internal carotid diameter as the denominator. RADIATION DOSE REDUCTION: This exam was performed according to the departmental dose-optimization program which includes automated exposure control, adjustment of the mA and/or kV according to patient size and/or use of iterative reconstruction technique. CONTRAST:  75mL OMNIPAQUE IOHEXOL 350 MG/ML SOLN COMPARISON:  CT head from today.  MRA Head December 10, 2019. FINDINGS: CTA NECK FINDINGS Aortic arch: Great vessel origins are patent. Right carotid system: Atherosclerosis at the carotid bifurcation without greater than 50% stenosis. Left carotid system: Patent.  No significant stenosis. Vertebral arteries: Right dominant. Left vertebral artery is patent without  significant stenosis. Left vertebral artery is small throughout its course with suspected superimposed stenosis. Skeleton: No acute abnormality on limited assessment. Other neck: No acute abnormality on limited assessment. Upper chest: Visualized lung apices are clear. Review of the MIP images confirms the above findings CTA HEAD FINDINGS Anterior circulation: Bilateral intracranial ICAs are patent with moderate paraclinoid ICA stenosis bilaterally. Bilateral MCAs and bilateral ACAs are patent proximally. Severe right M2 MCA branch stenosis. Critical stenosis versus occlusion of the distal right ACA (A4 segment). Posterior circulation: Small/non dominant left intradural vertebral artery with severe stenosis and distal occlusion. Right intradural vertebral artery is patent. Basilar artery is patent with moderate distal stenosis. Multifocal severe proximal P2 PCA stenosis with possible more distal left P2 PCA occlusion versus critical stenosis. Similar findings were present on prior MRA. Severe right P2 PCA, new since 2021. Venous sinuses: As permitted by contrast timing, patent. Review of the MIP images confirms the above findings IMPRESSION: 1. Multifocal severe proximal P2 PCA stenosis with possible more distal left P2 PCA occlusion versus critical stenosis. Similar findings were present on prior MRA. 2. Critical stenosis versus occlusion of the distal right ACA (A4 segment). 3. Severe right P2 PCA, new since 2021. 4. Severe right M2 MCA branch stenosis. 5. Small/non dominant left intradural vertebral artery with severe stenosis and distal occlusion. 6. Moderate distal basilar artery stenosis. 7. Moderate paraclinoid ICA stenosis bilaterally. 8. Left vertebral artery is patent without significant stenosis. 9. Left vertebral artery is small throughout its course in the neck with suspected superimposed stenosis. Preliminary imaging results were communicated on 07/27/2022 at 4:49 pm to provider Dr. Otelia Limes via  telephone, who verbally acknowledged these results. Electronically Signed   By: Feliberto Harts M.D.   On: 07/27/2022 17:00   CT HEAD CODE STROKE WO CONTRAST  Result Date: 07/27/2022 CLINICAL DATA:  Code stroke.  Neuro deficit, acute, stroke suspected EXAM: CT HEAD WITHOUT CONTRAST TECHNIQUE: Contiguous axial images were obtained from the base of the skull through the vertex without intravenous contrast. RADIATION DOSE REDUCTION: This exam was performed according to the departmental dose-optimization program which includes automated exposure control, adjustment of the mA and/or kV according to patient size and/or use of iterative reconstruction technique. COMPARISON:  CT head 09/13/2021. FINDINGS: Brain: No evidence of acute infarction, hemorrhage, hydrocephalus, extra-axial collection or mass lesion/mass effect. Similar areas of dystrophic calcification in the cerebellum and occipital lobes. Similar remote left parietal and right cerebellar infarct. Vascular: No hyperdense vessel vessel. Skull: No acute fracture.  Prior right occipital cranioplasty. Sinuses/Orbits: No acute abnormality. Other: No mastoid effusions. ASPECTS [facility] Stroke Program Early CT Score) total score (0-10 with 10 being normal): 10. IMPRESSION: 1. No evidence of acute intracranial abnormality. ASPECTS is 10. 2. Chronic infarcts, similar. Code stroke imaging results were communicated on 07/27/2022 at 4:33 pm to provider Dr. Otelia Limes via secure text paging. Electronically Signed  By: Feliberto Harts M.D.   On: 07/27/2022 16:33    PHYSICAL EXAM  Temp:  [97.3 F (36.3 C)-98.7 F (37.1 C)] 98.7 F (37.1 C) (07/13 1144) Pulse Rate:  [68-106] 68 (07/13 1100) Resp:  [18-32] 26 (07/13 1100) BP: (126-184)/(71-111) 152/83 (07/13 1100) SpO2:  [93 %-100 %] 98 % (07/13 1100) Weight:  [70.6 kg] 70.6 kg (07/12 1621)  General - Well nourished, well developed, in no apparent distress. Cardiovascular - Regular rhythm and rate.  Mental  Status -  Level of arousal and orientation to time, place, and person were intact.  Mild dysarthria Language including expression, naming, repetition, comprehension was assessed and found intact. Attention span and concentration were normal. Recent and remote memory were intact. Fund of Knowledge was assessed and was intact.  Cranial Nerves II - XII - II - Visual field intact OU. III, IV, VI - Extraocular movements intact. V -decreased on right VII -right facial droop VIII - Hearing & vestibular intact bilaterally. X - Palate elevates symmetrically. XI - Chin turning & shoulder shrug intact bilaterally. XII - Tongue protrusion intact.  Motor Strength -right upper extremity and right lower extremity 82/5 (old), left upper and lower 5/5  Sensory -decreased on right  Coordination - The patient had normal movements in the hands and feet with no ataxia or dysmetria.  Tremor was absent.  Gait and Station - deferred.  ASSESSMENT/PLAN Melissa Colon is a 62 y.o. 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   Stroke: Acute right occipital lobe ischemic infarct Etiology: Likely due to  intracranial stenosis Code Stroke  CT head No acute abnormality.  Small vessel disease.  ASPECTS 10.     CTA head & neck multifocal severe P2 PCA stenosis, critical stenosis versus occlusion of distal right ACA, severe right P2 PCA, severe right M2 MCA small nondominant left intradural vertebral artery with severe stenosis and distal occlusion, moderate distal basilar artery stenosis, moderate paraclinoid ICA stenosis laterally MRI acute right occipital lobe infarct 2D Echo EF 65 to 70%.  Left ventricle with grade 1 diastolic  dysfunction LDL 123 [id number] 5.6 VTE prophylaxis -Lovenox    Diet   Diet regular Fluid consistency: Thin   No antithrombotic prior to admission, now on aspirin 81 mg daily.  Therapy recommendations: Pending Disposition: Pending  Hypertension Home meds: None Stable Permissive hypertension (OK if < 220/120) but gradually normalize in 5-7 days Long-term BP goal normotensive  Hyperlipidemia Home meds: None LDL 123, goal < 70 Add atorvastatin 80 mg Continue statin at discharge  Other Stroke Risk Factors Cigarette smoker advised to stop smoking ETOH use, alcohol level <10, advised to drink no more than 1 drink(s) a day Hx stroke/TIA   Hospital day # 0  Gevena Mart DNP, ACNPC-AG  [facility]   ATTENDING NOTE: I have personally obtained a history, examined the patient, evaluated laboratory data, individually viewed imaging studies and agree with radiology interpretations. I obtained additional history from pt's  at bedside. Together with the NP/PA, we formulated the assessment and plan of care which reflects our mutual decision.  I have made any additions or clarifications directly to the above note and agree with the findings and plan as currently documented.    Windell Norfolk, MD  Neurology 07/28/2022 1:31 PM    To contact Stroke Continuity provider, please refer to [email or web address]. After hours, contact General Neurology

## 2022-07-28 NOTE — Progress Notes (Signed)
New patient admitted form the ED from home. Patient with new stroke like symptoms. Patient states she has had two previous strokes. Patient states slurred speech is not new for her and right sided weakness and decreased mobility of right upper and lower  extremity is not new for her. MP shows nsr. Family with patient at bedside. Patient is on room air. Patient and family oriented to bed call bell and belongings policy. Patient to keep belongings at bedside. No meds with patient.

## 2022-07-28 NOTE — Progress Notes (Signed)
  Echocardiogram 2D Echocardiogram has been performed.  Delcie Roch 07/28/2022, 9:37 AM

## 2022-07-28 NOTE — Progress Notes (Signed)
SLP Cancellation Note  Patient Details Name: Melissa Colon MRN: [id number] DOB: 05-06-1960   Cancelled treatment:       Reason Eval/Treat Not Completed: (P) SLP screened, no needs identified, will sign off (Pt passed yale swallow screen and RN reports pt swallows well; Swallow eval not indicated)   [name] 07/28/2022, 7:49 AM  [name], MS [facility] SLP Acute Rehab Services Office [phone]

## 2022-07-28 NOTE — Progress Notes (Signed)
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  EMS found her with blood pressure 201/117.  She does have history of intracranial hemorrhage.  Patient was not candidate for tPA.  Seen by neurology.  Admitted with acute to stroke.   Assessment & Plan:   Acute ischemic stroke right MCA territory: Admit to monitored unit because of severity of symptoms. Neurochecks and vital signs as per stroke protocol. Patient was not a TPA and vascular intervention candidate because of previous history of intracranial hemorrhage. Diet, she passed bedside swallow evaluation.  Started on regular diet. Antiplatelets, none at home.  Started on aspirin 81 mg daily. Statin, LDL 123.  No treatment at home.  He started on atorvastatin 80 mg daily. Blood pressure goals, permissive hypertension today. Consultations, neurology, speech, PT OT MRI of the brain demonstrates acute infarct in the medial, inferior right occipital lobe without evidence of hemorrhage.  Encephalomalacia consistent with previous left thalamic stroke and hemorrhage as noted in 2021. CT angiogram of the head and neck with multifocal severe P2 PCA stenosis and occlusion, distal right ACA occlusion, extensive intracranial stenosis. 2D echocardiogram, pending today.  Hyperlipidemia: As above  Essential hypertension: Permissive hypertension today.  Hypokalemia: Replace.     DVT prophylaxis: enoxaparin (LOVENOX) injection 40 mg Start: 07/27/22 2045   Code Status: Full code Family Communication: [name], brother-in-law on the phone Disposition Plan: Status is: Observation The patient will require  care spanning > 2 midnights and should be moved to inpatient because: Acute stroke, rehab and therapies, inpatient investigations     Consultants:  Neurology  Procedures:  None  Antimicrobials:  None   Subjective: Patient seen and examined.  Still in the emergency room.  Denies any new complaints.  She is just tearful that she had more  stroke.  Patient tells me she has weakness on the right side but more or less she is at her usual self.  Objective: Vitals:   07/28/22 0400 07/28/22 0500 07/28/22 0526 07/28/22 0800  BP: 135/72 139/72  126/75  Pulse: 86 71  (!) 106  Resp: 20 20  18   Temp:   98.2 F (36.8 C)   TempSrc:   Oral   SpO2: 95% 96%  96%  Weight:      Height:       No intake or output data in the 24 hours ending 07/28/22 0944 Filed Weights   07/27/22 1621  Weight: 70.6 kg    Examination:  General: Fairly comfortable on room air.  Patient is tearful otherwise alert awake and oriented. Cardiovascular: S1-S2 normal.  Regular rate rhythm. Respiratory: Bilateral clear.  No added sounds. Gastrointestinal: Soft.  Nontender.  Bowel sound present. Ext: No swelling or edema.  No cyanosis. Neuro:  No obvious cranial nerve deficits.  Face is symmetrical. Patient has right upper extremity and right lower extremity weakness which is likely chronic 3/5 in motor exam.     Data Reviewed: I have personally reviewed following labs and imaging studies  CBC: Recent Labs  Lab 07/27/22 1625 07/27/22 1736 07/28/22 0523  WBC  --  11.7* 8.9  NEUTROABS  --  8.5*  --   HGB 14.3 12.6 13.0  HCT 42.0 39.8 39.1  MCV  --  85.6 85.6  PLT  --  252 243   Basic Metabolic Panel: Recent Labs  Lab 07/27/22 1625 07/27/22 1736 07/28/22 0523  NA 138 137 136  K 3.4* 3.6 3.3*  CL 108 101 106  CO2  --  19* 21*  GLUCOSE 118* 112* 102*  BUN 12 11 7   CREATININE 0.50 0.67 0.59  CALCIUM  --  8.6* 8.4*  MG  --  1.9 1.8  PHOS  --   --  3.6   GFR: Estimated Creatinine Clearance:  69.7 mL/min (by C-G formula based on SCr of 0.59 mg/dL). Liver Function Tests: Recent Labs  Lab 07/27/22 1736  AST 26  ALT 30  ALKPHOS 109  BILITOT 0.4  PROT 7.1  ALBUMIN 3.4*   No results for input(s): "LIPASE", "AMYLASE" in the last 168 hours. No results for input(s): "AMMONIA" in the last 168 hours. Coagulation Profile: Recent Labs  Lab 07/27/22 1736  INR 1.0   Cardiac Enzymes: No results for input(s): "CKTOTAL", "CKMB", "CKMBINDEX", "TROPONINI" in the last 168 hours. BNP (last 3 results) No results for input(s): "PROBNP" in the last 8760 hours. HbA1C: Recent Labs    07/28/22 0523  HGBA1C 5.6   CBG: Recent Labs  Lab 07/27/22 1618  GLUCAP 103*   Lipid Profile: Recent Labs    07/28/22 0523  CHOL 184  HDL 35*  LDLCALC 123*  TRIG 130  CHOLHDL 5.3   Thyroid Function Tests: No results for input(s): "TSH", "T4TOTAL", "FREET4", "T3FREE", "THYROIDAB" in the last 72 hours. Anemia Panel: No results for input(s): "VITAMINB12", "FOLATE", "FERRITIN", "TIBC", "IRON", "RETICCTPCT" in the last 72 hours. Sepsis Labs: No results for input(s): "PROCALCITON", "LATICACIDVEN" in the last 168 hours.  No results found for this or any previous visit (from the past 240 hour(s)).       Radiology Studies: MR BRAIN WO CONTRAST  Result Date: 07/28/2022 CLINICAL DATA:  Headache, stroke suspected EXAM: MRI HEAD WITHOUT CONTRAST TECHNIQUE: Multiplanar, multiecho pulse sequences of the brain and surrounding structures were obtained without intravenous contrast. COMPARISON:  12/10/2019 MRI head, correlation is also made with 07/27/2022 CT head FINDINGS: Brain: Restricted diffusion with ADC correlate in the medial, inferior right occipital lobe (series 5, images 68-72). These areas are associated with mildly increased T2 hyperintense signal. No acute hemorrhage, mass, mass effect, or midline shift. No hydrocephalus or extra-axial collection. Partial empty sella. Normal craniocervical  junction. Redemonstrated large area of hemosiderin deposition in the left thalamus, extending into the left cerebral peduncle consistent with sequela of the acute hemorrhage noted in 2021. Hemosiderin deposition flow more remote right cerebellar hemorrhage, with additional foci in left cerebellum. Additional foci of hemosiderin deposition in the bilateral occipital, parietal, and temporal lobes, as well as the left basal ganglia and pons. Disproportionate cerebral volume loss in the bilateral parietal lobes, with redemonstrated sequela of prior left parietal infarct. Confluent T2 hyperintense signal in the periventricular white matter and pons, likely the sequela of moderate to severe chronic small vessel ischemic disease. Vascular: Partial loss of the left vertebral artery flow void, which is consistent with the stenosis seen on the same-day CTA. Otherwise patent proximal arterial flow voids. Skull and upper cervical spine: Normal marrow signal. Sinuses/Orbits: Clear paranasal sinuses. No acute finding in the orbits. Other: Trace fluid in the left mastoid air cells. IMPRESSION: 1. Acute infarct in the medial, inferior right occipital lobe. No evidence of acute hemorrhage. 2. Hemosiderin deposition and encephalomalacia in the  left thalamus, consistent with sequela of the acute hemorrhage noted in 2021. These results were called by telephone at the time of interpretation on 07/28/2022 at 1:27 am to provider HALL, who verbally acknowledged these results. Electronically Signed   By: Wiliam Ke M.D.   On: 07/28/2022 01:39   CT ANGIO HEAD NECK W WO CM (CODE STROKE)  Result Date: 07/27/2022 CLINICAL DATA:  Neuro deficit, acute, stroke suspected EXAM: CT ANGIOGRAPHY HEAD AND NECK WITH AND WITHOUT CONTRAST TECHNIQUE: Multidetector CT imaging of the head and neck was performed using the standard protocol during bolus administration of intravenous contrast. Multiplanar CT image reconstructions and MIPs were obtained to  evaluate the vascular anatomy. Carotid stenosis measurements (when applicable) are obtained utilizing NASCET criteria, using the distal internal carotid diameter as the denominator. RADIATION DOSE REDUCTION: This exam was performed according to the departmental dose-optimization program which includes automated exposure control, adjustment of the mA and/or kV according to patient size and/or use of iterative reconstruction technique. CONTRAST:  75mL OMNIPAQUE IOHEXOL 350 MG/ML SOLN COMPARISON:  CT head from today.  MRA Head December 10, 2019. FINDINGS: CTA NECK FINDINGS Aortic arch: Great vessel origins are patent. Right carotid system: Atherosclerosis at the carotid bifurcation without greater than 50% stenosis. Left carotid system: Patent.  No significant stenosis. Vertebral arteries: Right dominant. Left vertebral artery is patent without significant stenosis. Left vertebral artery is small throughout its course with suspected superimposed stenosis. Skeleton: No acute abnormality on limited assessment. Other neck: No acute abnormality on limited assessment. Upper chest: Visualized lung apices are clear. Review of the MIP images confirms the above findings CTA HEAD FINDINGS Anterior circulation: Bilateral intracranial ICAs are patent with moderate paraclinoid ICA stenosis bilaterally. Bilateral MCAs and bilateral ACAs are patent proximally. Severe right M2 MCA branch stenosis. Critical stenosis versus occlusion of the distal right ACA (A4 segment). Posterior circulation: Small/non dominant left intradural vertebral artery with severe stenosis and distal occlusion. Right intradural vertebral artery is patent. Basilar artery is patent with moderate distal stenosis. Multifocal severe proximal P2 PCA stenosis with possible more distal left P2 PCA occlusion versus critical stenosis. Similar findings were present on prior MRA. Severe right P2 PCA, new since 2021. Venous sinuses: As permitted by contrast timing, patent.  Review of the MIP images confirms the above findings IMPRESSION: 1. Multifocal severe proximal P2 PCA stenosis with possible more distal left P2 PCA occlusion versus critical stenosis. Similar findings were present on prior MRA. 2. Critical stenosis versus occlusion of the distal right ACA (A4 segment). 3. Severe right P2 PCA, new since 2021. 4. Severe right M2 MCA branch stenosis. 5. Small/non dominant left intradural vertebral artery with severe stenosis and distal occlusion. 6. Moderate distal basilar artery stenosis. 7. Moderate paraclinoid ICA stenosis bilaterally. 8. Left vertebral artery is patent without significant stenosis. 9. Left vertebral artery is small throughout its course in the neck with suspected superimposed stenosis. Preliminary imaging results were communicated on 07/27/2022 at 4:49 pm to provider Dr. Otelia Limes via telephone, who verbally acknowledged these results. Electronically Signed   By: Feliberto Harts M.D.   On: 07/27/2022 17:00   CT HEAD CODE STROKE WO CONTRAST  Result Date: 07/27/2022 CLINICAL DATA:  Code stroke.  Neuro deficit, acute, stroke suspected EXAM: CT HEAD WITHOUT CONTRAST TECHNIQUE: Contiguous axial images were obtained from the base of the skull through the vertex without intravenous contrast. RADIATION DOSE REDUCTION: This exam was performed according to the departmental dose-optimization program which includes automated exposure control, adjustment of  the mA and/or kV according to patient size and/or use of iterative reconstruction technique. COMPARISON:  CT head 09/13/2021. FINDINGS: Brain: No evidence of acute infarction, hemorrhage, hydrocephalus, extra-axial collection or mass lesion/mass effect. Similar areas of dystrophic calcification in the cerebellum and occipital lobes. Similar remote left parietal and right cerebellar infarct. Vascular: No hyperdense vessel vessel. Skull: No acute fracture.  Prior right occipital cranioplasty. Sinuses/Orbits: No acute  abnormality. Other: No mastoid effusions. ASPECTS [facility] Stroke Program Early CT Score) total score (0-10 with 10 being normal): 10. IMPRESSION: 1. No evidence of acute intracranial abnormality. ASPECTS is 10. 2. Chronic infarcts, similar. Code stroke imaging results were communicated on 07/27/2022 at 4:33 pm to provider Dr. Otelia Limes via secure text paging. Electronically Signed   By: Feliberto Harts M.D.   On: 07/27/2022 16:33        Scheduled Meds:  aspirin EC  81 mg Oral Daily   atorvastatin  80 mg Oral Daily   enoxaparin (LOVENOX) injection  40 mg Subcutaneous Q24H   folic acid  1 mg Oral Daily   multivitamin with minerals  1 tablet Oral Daily   potassium chloride  40 mEq Oral Once   thiamine  100 mg Oral Daily   Continuous Infusions:  sodium chloride 75 mL/hr at 07/28/22 0524     LOS: 0 days    Time spent: 35 minutes    Dorcas Carrow, MD [facility]

## 2022-07-29 DIAGNOSIS — R531 Weakness: Secondary | ICD-10-CM | POA: Diagnosis not present

## 2022-07-29 LAB — BASIC METABOLIC PANEL
Anion gap: 11 (ref 5–15)
BUN: 6 mg/dL (ref 6–20)
CO2: 18 mmol/L — ABNORMAL LOW (ref 22–32)
Calcium: 8.3 mg/dL — ABNORMAL LOW (ref 8.9–10.3)
Chloride: 109 mmol/L (ref 98–111)
Creatinine, Ser: 0.59 mg/dL (ref 0.44–1.00)
GFR, Estimated: >60 mL/min (ref >60)
Glucose, Bld: 108 mg/dL — ABNORMAL HIGH (ref 70–99)
Potassium: 4 mmol/L (ref 3.5–5.1)
Sodium: 138 mmol/L (ref 135–145)

## 2022-07-29 MED ORDER — CLOPIDOGREL BISULFATE 75 MG PO TABS
75.0000 mg | ORAL_TABLET | Freq: Every day | ORAL | Status: DC
  Administered 2022-07-29 – 2022-07-31 (×3): 75 mg via ORAL
  Filled 2022-07-29 (×3): qty 1

## 2022-07-29 MED ORDER — CLOPIDOGREL BISULFATE 75 MG PO TABS: 75.0000 mg | ORAL_TABLET | Freq: Every day | ORAL | 2 refills | Status: DC

## 2022-07-29 MED ORDER — ATORVASTATIN CALCIUM 80 MG PO TABS: 80.0000 mg | ORAL_TABLET | Freq: Every day | ORAL | 2 refills | Status: DC

## 2022-07-29 MED ORDER — FOLIC ACID 1 MG PO TABS: 1.0000 mg | ORAL_TABLET | Freq: Every day | ORAL | 0 refills | Status: DC

## 2022-07-29 MED ORDER — ASPIRIN 81 MG PO TBEC: 81.0000 mg | DELAYED_RELEASE_TABLET | Freq: Every day | ORAL | 12 refills | Status: DC

## 2022-07-29 MED ORDER — VITAMIN B-1 100 MG PO TABS: 100.0000 mg | ORAL_TABLET | Freq: Every day | ORAL | 0 refills | Status: DC

## 2022-07-29 NOTE — Plan of Care (Signed)

## 2022-07-29 NOTE — Plan of Care (Signed)

## 2022-07-29 NOTE — TOC Initial Note (Signed)
Transition of Care (TOC) - Initial/Assessment Note  Donn Pierini RN, BSN Transitions of Care Unit 4E- RN Case Manager See Treatment Team for direct phone # Weekend Coverage  Patient Details  Name: Melissa Colon MRN: [id number] Date of Birth: 09-24-1960  Transition of Care [facility]) CM/SW Contact:    Darrold Span, RN Phone Number: 07/29/2022, 11:37 AM  Clinical Narrative:                 Moreen Fowler from MD pt medically stable for discharge, refusing rehab, needs EMS transport home.  Noted recommendations for CIR- do not see notes that they have seen pt yet, CIR liaison contacted this CM and plans to speak with pt to confirm she is not interested in INPT rehab.   On arrival to the unit- bedside RN- voiced that pt's POA- [name] had called and is concerned about pt returning home as he does not feel she understands she is not safe to do so at her current function level and needs.   CM in to speak with pt, CIR liaison Lauren also present for conversation with pt at bedside to discuss safe transition plan.  Per pt she lives at home with spouse and son. Uses Wheelchair and walker. Discussed with pt that current PT/OT notes state she needs +2 max assist for mobility- pt can not verbalize a plan as to how she will manage at home.  While at the bedside- [name] called pt and spoke with her about going to rehab- [name] also spoke with this CM- per [name] he is her brother and POA- he also voiced that the has not been ambulatory for 44yrs since her stroke- however was able to assist with transfers- he feels it would be best if she would go to rehab so that she could hopefully get back to that level of being able to assist with transfers so that her son could continue to care for her in the home. Per [name]- spouse is unable to assist as he is also w/c bound.   Pt voiced she does not currently have PCP- "it's been years"  CM continued conversations with pt about safe discharge and short  stay in rehab- pt voiced she is now agreeable to have CIR screen her for potential admit- CIR liaison spoke with pt and completed screening.   TOC will continue to follow for transition plan- potential CIR admit vs SNF- pt at this time is agreeable to plan. D/c on hold for today- MD has been updated.    Expected Discharge Plan: IP Rehab Facility Barriers to Discharge: Unsafe home situation, Insurance Authorization   Patient Goals and CMS Choice Patient states their goals for this hospitalization and ongoing recovery are:: wants to return home CMS Medicare.gov Compare Post Acute Care list provided to:: Patient Choice offered to / list presented to : Patient, [employer] POA / Guardian      Expected Discharge Plan and Services   Discharge Planning Services: CM Consult Post Acute Care Choice: IP Rehab, Home Health Living arrangements for the past 2 months: Single Family Home Expected Discharge Date: 07/29/22                         HH Arranged: PT, OT          Prior Living Arrangements/Services Living arrangements for the past 2 months: Single Family Home Lives with:: Self, Spouse, Adult Children Patient language and need for interpreter reviewed:: Yes Do you feel safe  going back to the place where you live?: Yes      Need for Family Participation in Patient Care: Yes (Comment) Care giver support system in place?: Yes (comment) Current home services: DME (Wheelchair, [facility]) Criminal Activity/Legal Involvement Pertinent to Current Situation/Hospitalization: No - Comment as needed  Activities of Daily Living Home Assistive Devices/Equipment: Dentures (specify type), Wheelchair, Environmental consultant (specify type) ADL Screening (condition at time of admission) Patient's cognitive ability adequate to safely complete daily activities?: Yes Is the patient deaf or have difficulty hearing?: No Does the patient have difficulty seeing, even when wearing glasses/contacts?: No Does the patient have  difficulty concentrating, remembering, or making decisions?: No Patient able to express need for assistance with ADLs?: Yes Does the patient have difficulty dressing or bathing?: No Independently performs ADLs?: No Communication: Needs assistance Is this a change from baseline?: Change from baseline, expected to last >3 days Dressing (OT): Needs assistance Is this a change from baseline?: Change from baseline, expected to last <3days Grooming: Needs assistance Is this a change from baseline?: Pre-admission baseline Feeding: Needs assistance Is this a change from baseline?: Pre-admission baseline Bathing: Needs assistance Is this a change from baseline?: Pre-admission baseline Toileting: Needs assistance Is this a change from baseline?: Pre-admission baseline In/Out Bed: Needs assistance Is this a change from baseline?: Pre-admission baseline Walks in Home: Needs assistance Is this a change from baseline?: Change from baseline, expected to last <3 days Does the patient have difficulty walking or climbing stairs?: Yes Weakness of Legs: Both Weakness of Arms/Hands: Both  Permission Sought/Granted      Share Information with NAME: [name]     Permission granted to share info w Relationship: Brother  Permission granted to share info w Contact Information: [phone]  Emotional Assessment Appearance:: Appears stated age Attitude/Demeanor/Rapport: Engaged Affect (typically observed): Accepting Orientation: : Oriented to Self, Oriented to Place, Oriented to Situation Alcohol / Substance Use: Not Applicable Psych Involvement: No (comment)  Admission diagnosis:  Dysarthria [R47.1] Right sided weakness [R53.1] Stroke-like symptoms [R29.90] Stroke (cerebrum) [facility]) [I63.9] Patient Active Problem List   Diagnosis Date Noted   Stroke (cerebrum) (HCC) 07/28/2022   Right sided weakness 07/27/2022   Protein-calorie malnutrition, severe 12/16/2019   Hypertensive emergency  12/11/2019   Alcohol dependence (HCC) 12/11/2019   Tobacco use disorder 12/11/2019   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 12/10/2019   PCP:  Pcp, No Pharmacy:   [facility] - [place], [place] - [address] [place] [address] Phone: [phone] Fax: [phone]  [facility] [address] [place] [address] Phone: [phone] Fax: [phone]  [id number] [place], [place] - [address] AT [facility] [address] [place] [address] Phone: [phone] Fax: [phone]     Social Determinants of Health (SDOH) Social History: SDOH Screenings   Food Insecurity: Patient Declined (07/28/2022)  Housing: Patient Declined (07/28/2022)  Transportation Needs: Patient Declined (07/28/2022)  Utilities: Patient Declined (07/28/2022)  Tobacco Use: High Risk (09/13/2021)   SDOH Interventions:     Readmission Risk Interventions     No data to display

## 2022-07-29 NOTE — Discharge Instructions (Signed)
Follow with Primary MD Pcp, No in 7 days   Get CBC, CMP, 2 view Chest X ray -  checked next visit with your primary MD   Activity: As tolerated with Full fall precautions use walker/cane & assistance as needed  Disposition Home    Diet: Heart Healthy  with feeding assistance and aspiration precautions.  Special Instructions: If you have smoked or chewed Tobacco  in the last 2 yrs please stop smoking, stop any regular Alcohol  and or any Recreational drug use.  On your next visit with your primary care physician please Get Medicines reviewed and adjusted.  Please request your Prim.MD to go over all Hospital Tests and Procedure/Radiological results at the follow up, please get all Hospital records sent to your Prim MD by signing hospital release before you go home.  If you experience worsening of your admission symptoms, develop shortness of breath, life threatening emergency, suicidal or homicidal thoughts you must seek medical attention immediately by calling 911 or calling your MD immediately  if symptoms less severe.  You Must read complete instructions/literature along with all the possible adverse reactions/side effects for all the Medicines you take and that have been prescribed to you. Take any new Medicines after you have completely understood and accpet all the possible adverse reactions/side effects.

## 2022-07-29 NOTE — Progress Notes (Signed)
Inpatient Rehab Admissions Coordinator Note:   Per therapy patient was screened for CIR candidacy by  Luvenia Starch, CCC-SLP. At this time, pt appears to be a potential candidate for CIR. I will place an order for rehab consult for full assessment, per our protocol.  Please contact me any with questions.Wolfgang Phoenix, MS, CCC-SLP Admissions Coordinator [phone] 07/29/22 9:36 AM

## 2022-07-29 NOTE — Progress Notes (Signed)
OT Cancellation Note  Patient Details Name: Melissa Colon MRN: [id number] DOB: 04/30/1960   Cancelled Treatment:    Reason Eval/Treat Not Completed: Other (comment) (New imminent order acknowledged, OT evaluated 7/13 with no change in POC. OT to f/u for treatment as scheduled.)  Donia Pounds 07/29/2022, 7:24 AM

## 2022-07-29 NOTE — PMR Pre-admission (Signed)
PMR Admission Coordinator Pre-Admission Assessment  Patient: Melissa Colon is an 62 y.o., female MRN: [id number] DOB: 01-21-1960 Height: 5\' 2"  (157.5 cm) Weight: 70.6 kg  Insurance Information HMO:     PPO:      PCP:      IPA:      80/20:      OTHER:  PRIMARY: [place] [employer] [place] [employer]      Policy#: [id number]      Subscriber: patient CM Name: Herbert Seta      Phone#: [phone]     Fax#: [phone] Pre-Cert#: Zoila Shutter [id number] auth for CIR 7/16 through 7/22     Employer:  Benefits:  Phone #: [phone]     Name:  Eff. Date: 03/16/22     Deduct: does not have      Out of Pocket Max: does not have      Life Max: NA CIR: 100% coverage      SNF: 100% coverage Outpatient: 100% coverage     Co-Pay:  Home Health: 100% coverage      Co-Pay:  DME: 100% coverage     Co-Pay:  Providers: in-network SECONDARY:       Policy#:      Phone#:   Artist:       Phone#:   The Data processing manager" for patients in Inpatient Rehabilitation Facilities with attached "Privacy Act Statement-Health Care Records" was provided and verbally reviewed with: N/A  Emergency Contact Information Contact Information     Name Relation Home Work Mobile   Barkan,joseph Spouse   [phone]      Other Contacts     Name Relation Home Work Mobile   [place] Brother   [phone]   Madaline Guthrie Niece   [phone]       Current Medical History  Patient Admitting Diagnosis: CVA History of Present Illness: Pt is a 62 year old right-handed female with history significant for 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  Per chart review patient lives with spouse and children.  1 level home 4 steps to entry.  Patient  reports being ambulatory within the home with assistance provided by son/husband.  She does have a sedentary lifestyle.  Presented 07/27/2022 with acute occipital headache as well as right facial droop and worsening right-sided weakness with slurred speech and altered mental status.  Noted blood pressure 201/117.  Cranial CT scan showed no evidence of acute intracranial abnormality.  MRI identified acute infarct in the medial, inferior right occipital lobe.  No evidence of acute hemorrhage.  Patient did not receive TNK.  CT angiogram head and neck multifocal severe proximal P2 PCA stenosis with possible more distal left P2 PCA occlusion.  Critical stenosis versus occlusion of the distal right ACA.  Severe right P2 PCA new since 2021.  Admission chemistries unremarkable except glucose 112, alcohol and urine drug screen negative.  Echo with ejection fraction of 65 to 70% grade 1 diastolic dysfunction no regional wall motion abnormalities.  Neurology follow-up maintained on low-dose aspirin as well as Plavix for CVA prophylaxis x 90 days then aspirin alone.  Lovenox added for DVT prophylaxis.  Tolerating a regular consistency diet.  Therapy evaluations completed and pt was recommended for a comprehensive rehab program.   Complete NIHSS TOTAL: 2  Patient's medical record from [facility]  has been reviewed by the rehabilitation admission coordinator and physician.  Past Medical History  Past Medical History:  Diagnosis Date   Alcohol dependence (HCC)    H/O brain tumor    Smoker     Has the patient had major surgery during 100 days prior to admission? No  Family History   family history includes Diabetes in her mother.  Current Medications  Current Facility-Administered Medications:    acetaminophen (TYLENOL) tablet 650 mg, 650 mg, Oral, Q6H PRN, Darlin Drop, DO, 650 mg at 07/28/22 0533   aspirin EC tablet 81 mg, 81 mg, Oral, Daily, Darlin Drop, DO, 81 mg at 07/31/22 0850    atorvastatin (LIPITOR) tablet 80 mg, 80 mg, Oral, Daily, Darlin Drop, DO, 80 mg at 07/31/22 0850   clopidogrel (PLAVIX) tablet 75 mg, 75 mg, Oral, Daily, Leroy Sea, MD, 75 mg at 07/31/22 0850   enoxaparin (LOVENOX) injection 40 mg, 40 mg, Subcutaneous, Q24H, Hall, Carole N, DO, 40 mg at 07/30/22 2059   folic acid (FOLVITE) tablet 1 mg, 1 mg, Oral, Daily, Hall, Carole N, DO, 1 mg at 07/31/22 3557   lactated ringers infusion, , Intravenous, Continuous, Leroy Sea, MD, Last Rate: 125 mL/hr at 07/31/22 0521, New Bag at 07/31/22 0521   melatonin tablet 5 mg, 5 mg, Oral, QHS PRN, Dow Adolph N, DO   metoprolol tartrate (LOPRESSOR) injection 5 mg, 5 mg, Intravenous, Q8H PRN, Leroy Sea, MD   multivitamin with minerals tablet 1 tablet, 1 tablet, Oral, Daily, Hall, Carole N, DO, 1 tablet at 07/31/22 0850   polyethylene glycol (MIRALAX / GLYCOLAX) packet 17 g, 17 g, Oral, Daily PRN, Margo Aye, Carole N, DO   prochlorperazine (COMPAZINE) injection 5 mg, 5 mg, Intravenous, Q6H PRN, Hall, Carole N, DO   thiamine (VITAMIN B1) tablet 100 mg, 100 mg, Oral, Daily, Hall, Carole N, DO, 100 mg at 07/31/22 3220  Patients Current Diet:  Diet Order             Diet - low sodium heart healthy           Diet regular Fluid consistency: Thin  Diet effective now                   Precautions / Restrictions Precautions Precautions: Fall Precaution Comments: right UE and LE hemiparesis, intermittent diplopia Restrictions Weight Bearing Restrictions: No   Has the patient had 2 or more falls or a fall with injury in the past year? No  Prior Activity Level Limited Community (1-2x/wk): left house for medical appointments  Prior Functional Level Self Care: Did the patient need help bathing, dressing, using the toilet or eating? Needed some help  Indoor Mobility: Did the patient need assistance with walking from room to room (with or without device)? Dependent (hasn't walked in ~3  years)  Stairs: Did the patient need assistance with internal or external stairs (with or without device)? Dependent  Functional Cognition: Did the patient need help planning regular tasks such as shopping or remembering to take medications? Unknown (pt reports not having to take medication)  Patient Information Are you of Hispanic, Latino/a,or Spanish origin?: A. No, not of Hispanic, Latino/a, or Spanish origin What is your race?: A. White Do you need or want an interpreter to communicate with a doctor or health care staff?: 0. No  Patient's Response To:  Health Literacy and Transportation Is the patient able to respond to health literacy and transportation needs?: Yes Health Literacy -  How often do you need to have someone help you when you read instructions, pamphlets, or other written material from your doctor or pharmacy?: Never In the past 12 months, has lack of transportation kept you from medical appointments or from getting medications?: No In the past 12 months, has lack of transportation kept you from meetings, work, or from getting things needed for daily living?: No  Home Assistive Devices / Equipment Home Assistive Devices/Equipment: Dentures (specify type), Wheelchair, Environmental consultant (specify type) Home Equipment: Cane - single point, Agricultural consultant (2 wheels), BSC/3in1, Wheelchair - manual, Shower seat - built in  Prior Device Use: Indicate devices/aids used by the patient prior to current illness, exacerbation or injury? Manual wheelchair  Current Functional Level Cognition  Overall Cognitive Status: Impaired/Different from baseline Orientation Level: Oriented X4 Safety/Judgement: Decreased awareness of safety, Decreased awareness of deficits General Comments: slow response times, difficulty word finding.    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  Able to exhibit Brunnstrum stage V movement in the arm and stage VI in the hand.  Slight synergy pattern with shoulder flexion with slight increased tone in the wrist and digit flexors but she is able to activate extensors to help counter it.  Posterior upper arm shoulder pain with AAROM greater than 120 degrees. RUE Sensation: decreased light touch, decreased proprioception RUE Coordination: decreased fine motor, decreased gross motor  Lower Extremity Assessment: Defer to PT evaluation RLE Deficits / Details: MMT's collected: At least 2/5 for Hip Flexion, Knee Ext/Flexion, 3/5 for DF/PF. Decreased sensitivity to light touch through LE with less sensitivity more distal. RLE Sensation: decreased light touch RLE Coordination: decreased fine motor, decreased gross motor LLE Deficits / Details: MMT's collected: At least 3/5 for Hip Flexion, Knee Ext/Flex, DF and PF. Intact to light touch. LLE Sensation: WNL LLE Coordination: decreased fine motor    ADLs  Overall ADL's : Needs assistance/impaired Eating/Feeding: Supervision/ safety, Sitting Eating/Feeding Details (indicate cue type and reason): setup in recliner using the LUE primarily Grooming: Wash/dry hands, Wash/dry face, Sitting, Min guard Grooming Details (indicate cue type and reason): simulated Upper Body Bathing: Minimal assistance, Sitting Upper Body Bathing Details (indicate cue type and reason): simulated Lower Body Bathing: Total assistance, Sit to/from stand Lower Body Bathing Details (indicate cue type and reason): standing in the [place] Upper Body Dressing : Moderate assistance, Sitting Upper Body Dressing Details (indicate cue type and reason): hospital gown Lower Body Dressing: Maximal assistance, +2 for physical assistance, Sit to/from stand Toilet Transfer: Maximal assistance, Stand-pivot Toilet Transfer Details (indicate cue type and reason): simulated stand pivot to the bedside recliner Toileting- Clothing Manipulation  and Hygiene: Total assistance, Sit to/from stand Toileting - Clothing Manipulation Details (indicate cue type and reason): standing in the [place] Functional mobility during ADLs: Maximal assistance (step pivot transfer) General ADL Comments: Pt in bed with wet gown and sheets secondary to Purewick not being positioned against her.  Mod assist for transfer to sitting with therapist assist.  She was able to complete sit to stand in the [place] with mod assist and BUE support.  In standing, she needed mod assist to maintain secondary to motor impersistence and pt slowly sitting back down.  Total assist for completing of toilet hygiene in standing as if pt attempted she would sit down faster.  Pt with no report of diplopia when asked today.  Transferred step pivot to the wheelchair to eat lunch  with max facilitation to maintain hip extension and for advancing the RLE.    Mobility  Overal bed mobility: Needs Assistance Bed Mobility: Supine to Sit Supine to sit: Mod assist Sit to supine: Min guard General bed mobility comments: Assist needed only to scoot R LE and hip to edge of bed    Transfers  Overall transfer level: Needs assistance Equipment used:  Antony Salmon) Transfers: Sit to/from Stand, Bed to chair/wheelchair/BSC Sit to Stand: Mod assist (With use of the [place]) Bed to/from chair/wheelchair/BSC transfer type:: Step pivot Step pivot transfers: Max assist General transfer comment: Pt able to stand in the [place] with mod assist overall increasing to max with fatigue and motor impersistence.  She needed max assist for stand pivot with therapist having to provide max assist for upright trunk and hip extension and for advancing the LLE.    Ambulation / Gait / Stairs / Psychologist, prison and probation services  Ambulation/Gait General Gait Details: unable    Posture / Balance Dynamic Sitting Balance Sitting balance - Comments: no Lob sitting edge of bed Balance Overall balance assessment: Needs assistance Sitting-balance  support: Feet supported Sitting balance-Leahy Scale: Good Sitting balance - Comments: no Lob sitting edge of bed Postural control: Right lateral lean, Posterior lean Standing balance support: Bilateral upper extremity supported, Reliant on assistive device for balance Standing balance-Leahy Scale: Poor Standing balance comment: Pt needs support to maintain static standing.    Special needs/care consideration Skin Erythema/Redness: arm pit/right, Bladder incontinence, External Urinary Catheter   Previous Home Environment (from acute therapy documentation) Living Arrangements: Spouse/significant other, Children  Lives With: Spouse, Son Available Help at Discharge: Family, Available 24 hours/day Type of Home: House Home Layout: One level Home Access: Stairs to enter Entrance Stairs-Rails: Right, Left Entrance Stairs-Number of Steps: 4-5 Bathroom Shower/Tub: Health visitor: Standard (has bedside commode as well) Bathroom Accessibility: Yes How Accessible: Accessible via wheelchair Home Care Services: No  Discharge Living Setting Plans for Discharge Living Setting: Patient's home Type of Home at Discharge: House Discharge Home Layout: One level Discharge Home Access: Stairs to enter Entrance Stairs-Rails: Left, Right Entrance Stairs-Number of Steps: 4-5 Discharge Bathroom Shower/Tub: Walk-in shower Discharge Bathroom Toilet: Standard (has a bedside commode as well) Discharge Bathroom Accessibility: Yes How Accessible: Accessible via wheelchair Does the patient have any problems obtaining your medications?: No  Social/Family/Support Systems Anticipated Caregiver: [name] (brother) and [name] (son) Anticipated Industrial/product designer Information: [phone] Caregiver Availability: 24/7 Discharge Plan Discussed with Primary Caregiver: Yes Is Caregiver In Agreement with Plan?: Yes Does Caregiver/Family have Issues with Lodging/Transportation while Pt is in  Rehab?: No  Goals Patient/Family Goal for Rehab: Supervision-Min A:PT (transfers)/OT Expected length of stay: 14-16 days Pt/Family Agrees to Admission and willing to participate: Yes Program Orientation Provided & Reviewed with Pt/Caregiver Including Roles  & Responsibilities: Yes  Decrease burden of Care through IP rehab admission: NA  Possible need for SNF placement upon discharge: Not anticipated  Patient Condition: I have reviewed medical records from [facility], spoken with CM, and patient and family member. I met with patient at the bedside and discussed via phone for inpatient rehabilitation assessment.  Patient will benefit from ongoing PT and OT, can actively participate in 3 hours of therapy a day 5 days of the week, and can make measurable gains during the admission.  Patient will also benefit from the coordinated team approach during an Inpatient Acute Rehabilitation admission.  The patient will receive intensive therapy as well as Rehabilitation physician, nursing, social worker,  and care management interventions.  Due to bladder management, safety, skin/wound care, disease management, medication administration, pain management, and patient education the patient requires 24 hour a day rehabilitation nursing.  The patient is currently mod assist with mobility and basic ADLs.  Discharge setting and therapy post discharge at home with home health is anticipated.  Patient has agreed to participate in the Acute Inpatient Rehabilitation Program and will admit today.  Preadmission Screen Completed By:  Domingo Pulse, 07/31/2022 12:14 PM ______________________________________________________________________   Discussed status with Dr. Berline Chough on 07/31/22  at 12:14 PM  and received approval for admission today.  Admission Coordinator:  Domingo Pulse, CCC-SLP, time 12:14 PM /Date 07/31/22    Assessment/Plan: Diagnosis: R MCA stroke- worsening R hemiparesis Does the  need for close, 24 hr/day Medical supervision in concert with the patient's rehab needs make it unreasonable for this patient to be served in a less intensive setting? Yes Co-Morbidities requiring supervision/potential complications: prior ICH; HTN- uncontrolled- doesn't want ot take meds; smoker; EtOh use disorder;   Due to bladder management, bowel management, safety, skin/wound care, disease management, medication administration, pain management, and patient education, does the patient require 24 hr/day rehab nursing? Yes Does the patient require coordinated care of a physician, rehab nurse, PT, OT, and SLP to address physical and functional deficits in the context of the above medical diagnosis(es)? Yes Addressing deficits in the following areas: balance, endurance, locomotion, strength, transferring, bowel/bladder control, bathing, dressing, feeding, grooming, and toileting Can the patient actively participate in an intensive therapy program of at least 3 hrs of therapy 5 days a week? Yes The potential for patient to make measurable gains while on inpatient rehab is good Anticipated functional outcomes upon discharge from inpatient rehab: supervision and min assist PT, supervision and min assist OT, n/a SLP Estimated rehab length of stay to reach the above functional goals is: 14-18 days Anticipated discharge destination: Home 10. Overall Rehab/Functional Prognosis: good   MD Signature:

## 2022-07-29 NOTE — Discharge Summary (Signed)
Melissa Colon UJW:[id number] DOB: [birth year] DOA: 07/27/2022  PCP: Pcp, No  Admit date: 07/27/2022  Discharge date: 07/29/2022  Admitted From: Home   Disposition:  Home   Recommendations for Outpatient Follow-up:   Follow up with PCP in 1-2 weeks  PCP Please obtain BMP/CBC, 2 view CXR in 1week,  (see Discharge instructions)   PCP Please follow up on the following pending results:    Home Health: PT, OT, speech if qualifies. 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 EMS found her with blood pressure 201/117. She does have history of intracranial hemorrhage. Patient was not candidate for tPA. Seen by neurology. Admitted with acute to stroke.                                                                  Hospital Course   Acute ischemic stroke in the right MCA territory.  In a patient with previous history of stroke and intracerebral bleed.  She has previous right-sided weakness and facial droop she had acute on chronic weakness on that side with some headache, she was brought to the hospital where workup suggested new CVA, she had full stroke workup, case discussed with stroke physician morning of 07/29/2022 to Melissa Colon who recommends 3 months of DAPT thereafter aspirin, statin dose has been increased,  also discussed with patient's husband plan in detail.  Patient very noncompliant with home medications, strictly counseled on compliance.  Will get home PT OT if she qualifies, she does not want to go to SNF for CIR and wants to be discharged home.    Continues to have baseline right-sided hemiparesis and facial droop states this is mostly chronic with only minimal change, she wants to go home.  Husband agrees that patient hardly takes any medications for several months despite counseling.  PCP to monitor secondary factors for stroke.  Dyslipidemia.  Statin dose increased.  Hypertension.  Permissive hypertension for now thereafter PCP to monitor, confirmed with husband currently on no medications.  Extensive intracranial atherosclerosis.  Plan as above, case discussed with neurologist Dr. Teresa Colon.  Again patient noncompliant with home medications strictly counseled.   Discharge diagnosis     Principal Problem:   Right sided weakness Active Problems:   Stroke (cerebrum) [facility])    Discharge instructions  Discharge Instructions     Diet - low sodium heart healthy   Complete by: As directed    Discharge instructions   Complete by: As directed    Follow with Primary MD Pcp, No in 7 days   Get CBC, CMP, 2 view Chest X ray -  checked next visit with your primary MD   Activity: As tolerated with Full fall precautions use walker/cane & assistance as needed  Disposition Home    Diet: Heart Healthy  with feeding assistance and aspiration precautions.  Special Instructions: If you have smoked or chewed Tobacco  in the last 2 yrs please stop smoking, stop any regular Alcohol  and or any Recreational drug use.  On your next visit with your primary care physician please Get Medicines reviewed and adjusted.  Please request your Prim.MD to go over all Hospital Tests and Procedure/Radiological results at the follow up, please get all Hospital records sent to your Prim MD by signing hospital  release before you go home.  If you experience worsening of your admission symptoms, develop shortness of breath, life threatening emergency, suicidal or homicidal thoughts you must seek medical attention immediately by calling 911 or calling your MD immediately  if symptoms less severe.  You Must read complete instructions/literature along with all the possible adverse reactions/side effects for all the Medicines you take and that have been prescribed to you. Take any new Medicines after you have completely understood and accpet all the possible adverse reactions/side effects.   Increase activity slowly   Complete by: As directed        Discharge Medications   Allergies as of 07/29/2022   No Known Allergies      Medication List     TAKE these medications    aspirin EC 81 MG tablet Take 1 tablet (81 mg total) by mouth daily. Swallow whole.   atorvastatin 80 MG tablet Commonly known as: LIPITOR Take 1 tablet (80 mg total) by mouth daily. Start taking on: July 30, 2022   clopidogrel 75 MG tablet Commonly known as: PLAVIX Take 1 tablet (75 mg total) by mouth daily.   folic acid 1 MG tablet Commonly known as: FOLVITE Take 1 tablet (1 mg total) by mouth daily. Start taking on: July 30, 2022   thiamine 100 MG tablet Commonly known as: Vitamin B-1 Take 1 tablet (100 mg total) by mouth daily. Start taking on: July 30, 2022         Follow-up Information     PCP. Schedule an appointment as soon as possible for a visit in 1 week(s).          [facility]. Schedule an appointment as soon as possible for a visit in 1 week(s).   Contact information: [address]     [address] [place] [address] [phone]                Major procedures and Radiology Reports - PLEASE review detailed and final reports thoroughly  -       ECHOCARDIOGRAM COMPLETE BUBBLE STUDY  Result Date: 07/28/2022    ECHOCARDIOGRAM REPORT    Patient Name:   Melissa Colon Date of Exam: 07/28/2022 Medical Rec #:  [id number]         Height:       62.0 in Accession #:    [id number]        Weight:       155.6 lb Date of Birth:  [birth year]  BSA:          1.718 m Patient Age:    61 years          BP:           139/72 mmHg Patient Gender: F                 HR:           77 bpm. Exam Location:  Inpatient Procedure: 2D Echo, Color Doppler, Cardiac Doppler and Saline Contrast Bubble            Study Indications:    Stroke  History:        Patient has prior history of Echocardiogram examinations, most                 recent 12/11/2019. Risk Factors:Current Smoker, Hypertension and                 Alcohol abuse.  Sonographer:    Delcie Roch RDCS Referring Phys: [id number] CAROLE N HALL IMPRESSIONS  1. Left ventricular ejection fraction, by estimation, is 65 to 70%. The left ventricle has normal function. The left ventricle has no regional wall motion abnormalities. Left ventricular diastolic parameters are consistent with Grade I diastolic dysfunction (impaired relaxation).  2. Right ventricular systolic function is normal. The right ventricular size is normal. There is normal pulmonary artery systolic pressure.  3. The mitral valve is normal in structure. No evidence of mitral valve regurgitation. No evidence of mitral stenosis.  4. The aortic valve is normal in structure. There is mild calcification of the aortic valve. Aortic valve regurgitation is not visualized. No aortic stenosis is present.  5. The inferior vena cava is normal in size with greater than 50% respiratory variability, suggesting right atrial pressure of 3 mmHg. FINDINGS  Left Ventricle: Left ventricular ejection fraction, by estimation, is 65 to 70%. The left ventricle has normal function. The left ventricle has no regional wall motion abnormalities. The left ventricular internal cavity size was normal in size. There is  no left ventricular hypertrophy. Left ventricular diastolic  parameters are consistent with Grade I diastolic dysfunction (impaired relaxation). Right Ventricle: The right ventricular size is normal. No increase in right ventricular wall thickness. Right ventricular systolic function is normal. There is normal pulmonary artery systolic pressure. The tricuspid regurgitant velocity is 2.33 m/s, and  with an assumed right atrial pressure of 3 mmHg, the estimated right ventricular systolic pressure is 24.7 mmHg. Left Atrium: Left atrial size was normal in size. Right Atrium: Right atrial size was normal in size. Pericardium: There is no evidence of pericardial effusion. Mitral Valve: The mitral valve is normal in structure. No evidence of mitral valve regurgitation. No evidence of mitral valve stenosis. Tricuspid Valve: The tricuspid valve is normal in structure. Tricuspid valve regurgitation is trivial. No evidence of tricuspid stenosis. Aortic Valve: The aortic valve is normal in structure. There is mild calcification of the aortic valve. Aortic valve regurgitation is not visualized. No aortic stenosis is present. Pulmonic Valve: The pulmonic valve was normal in structure. Pulmonic valve regurgitation is not visualized. No evidence of pulmonic stenosis. Aorta: The aortic root is normal in size and structure. Venous: The inferior vena cava is normal in size with greater than 50% respiratory variability, suggesting right atrial pressure of 3 mmHg. IAS/Shunts: No atrial level shunt detected by color flow Doppler. Agitated saline contrast was given intravenously to evaluate for intracardiac shunting.  LEFT VENTRICLE PLAX 2D LVIDd:  3.90 cm   Diastology LVIDs:         2.30 cm   LV e' medial:    6.64 cm/s LV PW:         0.80 cm   LV E/e' medial:  11.3 LV IVS:        0.80 cm   LV e' lateral:   8.49 cm/s LVOT diam:     1.80 cm   LV E/e' lateral: 8.8 LV SV:         54 LV SV Index:   32 LVOT Area:     2.54 cm  RIGHT VENTRICLE             IVC RV Basal diam:  2.00 cm     IVC diam:  1.00 cm RV S prime:     11.90 cm/s TAPSE (M-mode): 1.8 cm LEFT ATRIUM             Index        RIGHT ATRIUM          Index LA diam:        2.80 cm 1.63 cm/m   RA Area:     8.64 cm LA Vol (A2C):   17.2 ml 10.01 ml/m  RA Volume:   13.70 ml 7.97 ml/m LA Vol (A4C):   14.6 ml 8.50 ml/m LA Biplane Vol: 16.9 ml 9.83 ml/m  AORTIC VALVE LVOT Vmax:   107.00 cm/s LVOT Vmean:  72.400 cm/s LVOT VTI:    0.213 m  AORTA Ao Root diam: 3.10 cm Ao Asc diam:  3.10 cm MITRAL VALVE               TRICUSPID VALVE MV Area (PHT): 4.21 cm    TR Peak grad:   21.7 mmHg MV Decel Time: 180 msec    TR Vmax:        233.00 cm/s MV E velocity: 75.10 cm/s MV A velocity: 81.20 cm/s  SHUNTS MV E/A ratio:  0.92        Systemic VTI:  0.21 m                            Systemic Diam: 1.80 cm Arvilla Meres MD Electronically signed by Arvilla Meres MD Signature Date/Time: 07/28/2022/10:35:33 AM    Final    MR BRAIN WO CONTRAST  Result Date: 07/28/2022 CLINICAL DATA:  Headache, stroke suspected EXAM: MRI HEAD WITHOUT CONTRAST TECHNIQUE: Multiplanar, multiecho pulse sequences of the brain and surrounding structures were obtained without intravenous contrast. COMPARISON:  12/10/2019 MRI head, correlation is also made with 07/27/2022 CT head FINDINGS: Brain: Restricted diffusion with ADC correlate in the medial, inferior right occipital lobe (series 5, images 68-72). These areas are associated with mildly increased T2 hyperintense signal. No acute hemorrhage, mass, mass effect, or midline shift. No hydrocephalus or extra-axial collection. Partial empty sella. Normal craniocervical junction. Redemonstrated large area of hemosiderin deposition in the left thalamus, extending into the left cerebral peduncle consistent with sequela of the acute hemorrhage noted in 2021. Hemosiderin deposition flow more remote right cerebellar hemorrhage, with additional foci in left cerebellum. Additional foci of hemosiderin deposition in the bilateral occipital,  parietal, and temporal lobes, as well as the left basal ganglia and pons. Disproportionate cerebral volume loss in the bilateral parietal lobes, with redemonstrated sequela of prior left parietal infarct. Confluent T2 hyperintense signal in the periventricular white matter and pons, likely the sequela of moderate to severe chronic  small vessel ischemic disease. Vascular: Partial loss of the left vertebral artery flow void, which is consistent with the stenosis seen on the same-day CTA. Otherwise patent proximal arterial flow voids. Skull and upper cervical spine: Normal marrow signal. Sinuses/Orbits: Clear paranasal sinuses. No acute finding in the orbits. Other: Trace fluid in the left mastoid air cells. IMPRESSION: 1. Acute infarct in the medial, inferior right occipital lobe. No evidence of acute hemorrhage. 2. Hemosiderin deposition and encephalomalacia in the left thalamus, consistent with sequela of the acute hemorrhage noted in 2021. These results were called by telephone at the time of interpretation on 07/28/2022 at 1:27 am to provider HALL, who verbally acknowledged these results. Electronically Signed   By: Wiliam Ke M.D.   On: 07/28/2022 01:39   CT ANGIO HEAD NECK W WO CM (CODE STROKE)  Result Date: 07/27/2022 CLINICAL DATA:  Neuro deficit, acute, stroke suspected EXAM: CT ANGIOGRAPHY HEAD AND NECK WITH AND WITHOUT CONTRAST TECHNIQUE: Multidetector CT imaging of the head and neck was performed using the standard protocol during bolus administration of intravenous contrast. Multiplanar CT image reconstructions and MIPs were obtained to evaluate the vascular anatomy. Carotid stenosis measurements (when applicable) are obtained utilizing NASCET criteria, using the distal internal carotid diameter as the denominator. RADIATION DOSE REDUCTION: This exam was performed according to the departmental dose-optimization program which includes automated exposure control, adjustment of the mA and/or kV  according to patient size and/or use of iterative reconstruction technique. CONTRAST:  75mL OMNIPAQUE IOHEXOL 350 MG/ML SOLN COMPARISON:  CT head from today.  MRA Head December 10, 2019. FINDINGS: CTA NECK FINDINGS Aortic arch: Great vessel origins are patent. Right carotid system: Atherosclerosis at the carotid bifurcation without greater than 50% stenosis. Left carotid system: Patent.  No significant stenosis. Vertebral arteries: Right dominant. Left vertebral artery is patent without significant stenosis. Left vertebral artery is small throughout its course with suspected superimposed stenosis. Skeleton: No acute abnormality on limited assessment. Other neck: No acute abnormality on limited assessment. Upper chest: Visualized lung apices are clear. Review of the MIP images confirms the above findings CTA HEAD FINDINGS Anterior circulation: Bilateral intracranial ICAs are patent with moderate paraclinoid ICA stenosis bilaterally. Bilateral MCAs and bilateral ACAs are patent proximally. Severe right M2 MCA branch stenosis. Critical stenosis versus occlusion of the distal right ACA (A4 segment). Posterior circulation: Small/non dominant left intradural vertebral artery with severe stenosis and distal occlusion. Right intradural vertebral artery is patent. Basilar artery is patent with moderate distal stenosis. Multifocal severe proximal P2 PCA stenosis with possible more distal left P2 PCA occlusion versus critical stenosis. Similar findings were present on prior MRA. Severe right P2 PCA, new since 2021. Venous sinuses: As permitted by contrast timing, patent. Review of the MIP images confirms the above findings IMPRESSION: 1. Multifocal severe proximal P2 PCA stenosis with possible more distal left P2 PCA occlusion versus critical stenosis. Similar findings were present on prior MRA. 2. Critical stenosis versus occlusion of the distal right ACA (A4 segment). 3. Severe right P2 PCA, new since 2021. 4. Severe right  M2 MCA branch stenosis. 5. Small/non dominant left intradural vertebral artery with severe stenosis and distal occlusion. 6. Moderate distal basilar artery stenosis. 7. Moderate paraclinoid ICA stenosis bilaterally. 8. Left vertebral artery is patent without significant stenosis. 9. Left vertebral artery is small throughout its course in the neck with suspected superimposed stenosis. Preliminary imaging results were communicated on 07/27/2022 at 4:49 pm to provider Dr. Otelia Limes via telephone, who verbally acknowledged these  results. Electronically Signed   By: Feliberto Harts M.D.   On: 07/27/2022 17:00   CT HEAD CODE STROKE WO CONTRAST  Result Date: 07/27/2022 CLINICAL DATA:  Code stroke.  Neuro deficit, acute, stroke suspected EXAM: CT HEAD WITHOUT CONTRAST TECHNIQUE: Contiguous axial images were obtained from the base of the skull through the vertex without intravenous contrast. RADIATION DOSE REDUCTION: This exam was performed according to the departmental dose-optimization program which includes automated exposure control, adjustment of the mA and/or kV according to patient size and/or use of iterative reconstruction technique. COMPARISON:  CT head 09/13/2021. FINDINGS: Brain: No evidence of acute infarction, hemorrhage, hydrocephalus, extra-axial collection or mass lesion/mass effect. Similar areas of dystrophic calcification in the cerebellum and occipital lobes. Similar remote left parietal and right cerebellar infarct. Vascular: No hyperdense vessel vessel. Skull: No acute fracture.  Prior right occipital cranioplasty. Sinuses/Orbits: No acute abnormality. Other: No mastoid effusions. ASPECTS [facility] Stroke Program Early CT Score) total score (0-10 with 10 being normal): 10. IMPRESSION: 1. No evidence of acute intracranial abnormality. ASPECTS is 10. 2. Chronic infarcts, similar. Code stroke imaging results were communicated on 07/27/2022 at 4:33 pm to provider Dr. Otelia Limes via secure text paging.  Electronically Signed   By: Feliberto Harts M.D.   On: 07/27/2022 16:33    Micro Results    No results found for this or any previous visit (from the past 240 hour(s)).  Today   Subjective    Melissa Colon today has no headache,no chest abdominal pain,no new weakness tingling or numbness, feels much better wants to go home today.     Objective   Blood pressure (!) 140/69, pulse 83, temperature 98.1 F (36.7 C), temperature source Oral, resp. rate 20, height 5\' 2"  (1.575 m), weight 70.6 kg, SpO2 95%.   Intake/Output Summary (Last 24 hours) at 07/29/2022 1018 Last data filed at 07/29/2022 1610 Gross per 24 hour  Intake 1564.24 ml  Output 850 ml  Net 714.24 ml    Exam  Awake Alert, chronic right-sided weakness and facial droop, eager to go home [place].AT,PERRAL Supple Neck,   Symmetrical Chest wall movement, Good air movement bilaterally, CTAB RRR,No Gallops,   +ve B.Sounds, Abd Soft, Non tender,  No Cyanosis, Clubbing or edema    Data Review   Recent Labs  Lab 07/27/22 1625 07/27/22 1736 07/28/22 0523  WBC  --  11.7* 8.9  HGB 14.3 12.6 13.0  HCT 42.0 39.8 39.1  PLT  --  252 243  MCV  --  85.6 85.6  MCH  --  27.1 28.4  MCHC  --  31.7 33.2  RDW  --  12.6 12.5  LYMPHSABS  --  2.2  --   MONOABS  --  0.7  --   EOSABS  --  0.2  --   BASOSABS  --  0.1  --     Recent Labs  Lab 07/27/22 1625 07/27/22 1736 07/28/22 0523 07/29/22 0555  NA 138 137 136 138  K 3.4* 3.6 3.3* 4.0  CL 108 101 106 109  CO2  --  19* 21* 18*  ANIONGAP  --  17* 9 11  GLUCOSE 118* 112* 102* 108*  BUN 12 11 7 6   CREATININE 0.50 0.67 0.59 0.59  AST  --  26  --   --   ALT  --  30  --   --   ALKPHOS  --  109  --   --   BILITOT  --  0.4  --   --  ALBUMIN  --  3.4*  --   --   INR  --  1.0  --   --   HGBA1C  --   --  5.6  --   MG  --  1.9 1.8  --   CALCIUM  --  8.6* 8.4* 8.3*    Total Time in preparing paper work, data evaluation and todays exam - 35 minutes  Signature  -     Susa Raring M.D on 07/29/2022 at 10:18 AM   -  To page go to [email or web address]

## 2022-07-29 NOTE — Progress Notes (Signed)
STROKE TEAM PROGRESS NOTE   INTERVAL HISTORY Patient seen and evaluated this morning. No acute overnight events noted. She reports that she is feeling much better today. Denies headaches, denies changes in her vision. Echo completed with EF 65-70%. Will be discharged home with home health.   Vitals:   07/29/22 0430 07/29/22 0443 07/29/22 0800 07/29/22 1128  BP: (!) 133/100 (!) 140/69  (!) 159/75  Pulse: 88 83  93  Resp: 18 19 20 17   Temp: 97.7 F (36.5 C)  98.1 F (36.7 C) 98.5 F (36.9 C)  TempSrc: Oral  Oral Oral  SpO2: 94% 95%  95%  Weight:      Height:       CBC:  Recent Labs  Lab 07/27/22 1736 07/28/22 0523  WBC 11.7* 8.9  NEUTROABS 8.5*  --   HGB 12.6 13.0  HCT 39.8 39.1  MCV 85.6 85.6  PLT 252 243   Basic Metabolic Panel:  Recent Labs  Lab 07/27/22 1736 07/28/22 0523 07/29/22 0555  NA 137 136 138  K 3.6 3.3* 4.0  CL 101 106 109  CO2 19* 21* 18*  GLUCOSE 112* 102* 108*  BUN 11 7 6   CREATININE 0.67 0.59 0.59  CALCIUM 8.6* 8.4* 8.3*  MG 1.9 1.8  --   PHOS  --  3.6  --    Lipid Panel:  Recent Labs  Lab 07/28/22 0523  CHOL 184  TRIG 130  HDL 35*  CHOLHDL 5.3  VLDL 26  LDLCALC 161*   HgbA1c:  Recent Labs  Lab 07/28/22 0523  HGBA1C 5.6   Urine Drug Screen:  Recent Labs  Lab 07/27/22 2127  LABOPIA NONE DETECTED  COCAINSCRNUR NONE DETECTED  LABBENZ NONE DETECTED  AMPHETMU NONE DETECTED  THCU NONE DETECTED  LABBARB NONE DETECTED    Alcohol Level  Recent Labs  Lab 07/27/22 1736  ETH <10    IMAGING past 24 hours No results found.  PHYSICAL EXAM  Temp:  [97.3 F (36.3 C)-98.5 F (36.9 C)] 98.5 F (36.9 C) (07/14 1128) Pulse Rate:  [83-93] 93 (07/14 1128) Resp:  [16-22] 17 (07/14 1128) BP: (133-159)/(69-100) 159/75 (07/14 1128) SpO2:  [94 %-98 %] 95 % (07/14 1128)  General - Well nourished, well developed, in no apparent distress. Cardiovascular - Regular rhythm and rate.  Mental Status -  Level of arousal and orientation  to time, place, and person were intact.  Mild dysarthria Language including expression, naming, repetition, comprehension was assessed and found intact. Attention span and concentration were normal. Recent and remote memory were intact. Fund of Knowledge was assessed and was intact.  Cranial Nerves II - XII - II - Visual field intact OU. III, IV, VI - Extraocular movements intact. V -decreased on right VII -right facial droop VIII - Hearing & vestibular intact bilaterally. X - Palate elevates symmetrically. XI - Chin turning & shoulder shrug intact bilaterally. XII - Tongue protrusion intact.  Motor Strength -right upper extremity and right lower extremity 67/5 (old), left upper and lower 5/5  Sensory -decreased on right  Coordination - The patient had normal movements in the hands and feet with no ataxia or dysmetria.  Tremor was absent.  Gait and Station - deferred.  ASSESSMENT/PLAN Ms. Nazyiah Wellman is a 62 y.o. 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 Plan for DAPT, Aspirin and Plavix for a total of 90 days due to intracranial atherosclerosis and continue to Aspirin alone.   Stroke: Acute right occipital lobe ischemic infarct Etiology: Likely due to  intracranial stenosis Code Stroke  CT head No acute abnormality.  Small vessel disease.  ASPECTS 10.     CTA head & neck multifocal severe P2 PCA stenosis, critical stenosis versus occlusion of distal right ACA, severe right P2 PCA, severe right M2 MCA small nondominant left intradural vertebral artery with severe stenosis and distal occlusion, moderate distal basilar artery stenosis, moderate paraclinoid ICA stenosis laterally MRI acute right  occipital lobe infarct 2D Echo EF 65 to 70%.  Left ventricle with grade 1 diastolic dysfunction LDL 123 HgbA1c 5.6 VTE prophylaxis -Lovenox    Diet   Diet regular Fluid consistency: Thin   No antithrombotic prior to admission, now on aspirin 81 mg daily and clopidogrel 75 mg daily. Plan to continue with ASA and Plavix for a total of 90 due to intracranial atherosclerosis and then to continue with Aspirin alone.  Therapy recommendations: Home PT/OT Disposition: Home with referral to home services.   Hypertension Home meds: None Stable Permissive hypertension (OK if < 220/120) but gradually normalize in 5-7 days Long-term BP goal normotensive  Hyperlipidemia Home meds: None LDL 123, goal < 70 Add atorvastatin 80 mg Continue statin at discharge  Other Stroke Risk Factors Cigarette smoker advised to stop smoking ETOH use, alcohol level <10, advised to drink no more than 1 drink(s) a day Hx stroke/TIA   Hospital day # 2    ATTENDING NOTE: I have personally obtained a history, examined the patient, evaluated laboratory data, individually viewed imaging studies and agree with radiology interpretations. I obtained additional history from pt's  at bedside. Together with the NP/PA, we formulated the assessment and plan of care which reflects our mutual decision.  I have made any additions or clarifications directly to the above note and agree with the findings and plan as currently documented. I have spent a total of dedicated to this patient today, preparing to see patient, performing a medically appropriate examination and evaluation, ordering tests and/or medications and procedures, and counseling and educating the patient/family/caregiver; independently interpreting result and communicating results to the family/patient/caregiver; and documenting clinical information in the electronic medical record.    Windell Norfolk, MD Neurology 07/29/2022 12:29 PM    To contact  Stroke Continuity provider, please refer to [email or web address]. After hours, contact General Neurology

## 2022-07-29 NOTE — Progress Notes (Signed)
Inpatient Rehab Admissions:  Inpatient Rehab Consult received.  I met with patient at the bedside for rehabilitation assessment and to discuss goals and expectations of an inpatient rehab admission.  CM Donn Pierini also present. Discussed average length of stay, insurance authorization requirement and discharge home after completion of CIR. Pt acknowledged  understanding. Pt interested in pursuing CIR. Pt gave permission to contact family. Spoke with [name], pt's brother and POA, on the telephone. He also acknowledge understanding of CIR goals and expectations as pt was in CIR in 2021. He confirmed that pt's son [name] will be able to provide 24/7 support for pt after discharge if pt able to reach Min-Mod A level with transfers. Will discuss with physiatrist. Will continue to follow.  Signed: Wolfgang Phoenix, MS, CCC-SLP Admissions Coordinator [phone]

## 2022-07-29 NOTE — Progress Notes (Signed)
PT Cancellation Note  Patient Details Name: Melissa Colon MRN: [id number] DOB: [birth year]   Cancelled Treatment:    Reason Eval/Treat Not Completed: Other (comment), noted new order for "imminent discharge" this morning. After discussion with ordering provider, there has been no clinical changes since PT evaluation yesterday afternoon. Will continue with established plan of care.   Vickki Muff, PT, DPT   Acute Rehabilitation Department Office [phone] Secure Chat Communication Preferred   Ronnie Derby 07/29/2022, 8:04 AM

## 2022-07-30 DIAGNOSIS — R531 Weakness: Secondary | ICD-10-CM | POA: Diagnosis not present

## 2022-07-30 MED ORDER — LACTATED RINGERS IV SOLN: INTRAVENOUS | Status: AC

## 2022-07-30 MED ORDER — METOPROLOL TARTRATE 5 MG/5ML IV SOLN: 5.0000 mg | Freq: Three times a day (TID) | INTRAVENOUS | Status: DC | PRN

## 2022-07-30 NOTE — Progress Notes (Signed)
Physical Therapy Treatment Patient Details Name: Melissa Colon MRN: [id number] DOB: 08-06-1960 Today's Date: 07/30/2022   History of Present Illness Pt is a 62 y/o presenting to the emergency room as a code stroke activated by EMS when family reported slurred speech and right facial droop.  EMS found her with blood pressure 201/117.  She does have history of intracranial hemorrhage.  Patient was not candidate for tPA. MRI 7/13 revealed acute infarct in medial inferior R occipital lobe with no evidence of acute hemorrhage, encephalomalacia in L thalamus consistent with acute hemorrhage in 2021. 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    PT Comments  Patient received in bed, she is pleasant and agrees to PT. She reports numbness in R UE. Patient labile/frustrated regarding situation. She is able to go from supine to sit with min guard initially, but mod A tor bring R LE and hip forward to edge of bed. Patient is able to stand with min A holding to bed with L UE. She is able to take 1 side step to her left with mod A and blocking R knee. Patient required no assistance to raise R LE to bring back up onto bed. Patient will benefit from > 3 hours of therapy a day to improve functional mobility and independence.       Assistance Recommended at Discharge    If plan is discharge home, recommend the following:  Can travel by private vehicle    A little help with bathing/dressing/bathroom;Direct supervision/assist for medications management;Assistance with cooking/housework;Assist for transportation;Help with stairs or ramp for entrance;A lot of help with walking and/or transfers      Equipment Recommendations  None recommended by PT;Other (comment) (TBD)    Recommendations for Other Services Rehab consult     Precautions / Restrictions Precautions Precautions: Fall Precaution Comments: right UE  and LE hemiparesis, intermittent diplopia Restrictions Weight Bearing Restrictions: No     Mobility  Bed Mobility Overal bed mobility: Needs Assistance Bed Mobility: Supine to Sit     Supine to sit: Mod assist Sit to supine: Min guard   General bed mobility comments: Assist needed only to scoot R LE and hip to edge of bed    Transfers Overall transfer level: Needs assistance Equipment used: None, 1 person hand held assist Transfers: Sit to/from Stand Sit to Stand: Mod assist           General transfer comment: mod A patient holding to bed with L UE.    Ambulation/Gait               General Gait Details: unable   Stairs             Wheelchair Mobility     Tilt Bed    Modified Rankin (Stroke Patients Only) Modified Rankin (Stroke Patients Only) Pre-Morbid Rankin Score: Moderately severe disability Modified Rankin: Severe disability     Balance Overall balance assessment: Needs assistance Sitting-balance support: Feet supported Sitting balance-Leahy Scale: Good Sitting balance - Comments: no Lob sitting edge of bed   Standing balance support: Bilateral upper extremity supported, Reliant on assistive device for balance Standing balance-Leahy Scale: Fair Standing balance comment: Able to stand with R side min/mod A and L hand holding to bed rail. Mild buckling of R LE with weight shifting.  Cognition Arousal/Alertness: Awake/alert Behavior During Therapy: WFL for tasks assessed/performed Overall Cognitive Status: Impaired/Different from baseline Area of Impairment: Problem solving, Memory                     Memory: Decreased short-term memory   Safety/Judgement: Decreased awareness of safety, Decreased awareness of deficits Awareness: Intellectual Problem Solving: Slow processing, Decreased initiation, Requires verbal cues, Requires tactile cues, Difficulty sequencing General Comments: slow  response times, difficulty word finding.        Exercises Other Exercises Other Exercises: R LE strengthening exercises: AP, heel slides, hip abd/add x 10 reps    General Comments        Pertinent Vitals/Pain Pain Assessment Pain Assessment: No/denies pain    Home Living                          Prior Function            PT Goals (current goals can now be found in the care plan section) Acute Rehab PT Goals Patient Stated Goal: to go to CIR, then home PT Goal Formulation: With patient Time For Goal Achievement: 08/11/22 Potential to Achieve Goals: Good Progress towards PT goals: Progressing toward goals    Frequency    Min 3X/week      PT Plan Current plan remains appropriate    Co-evaluation              AM-PAC PT "6 Clicks" Mobility   Outcome Measure  Help needed turning from your back to your side while in a flat bed without using bedrails?: A Little Help needed moving from lying on your back to sitting on the side of a flat bed without using bedrails?: A Lot Help needed moving to and from a bed to a chair (including a wheelchair)?: A Lot Help needed standing up from a chair using your arms (e.g., wheelchair or bedside chair)?: A Lot Help needed to walk in hospital room?: A Lot Help needed climbing 3-5 steps with a railing? : Total 6 Click Score: 12    End of Session   Activity Tolerance: Patient tolerated treatment well Patient left: in bed;with bed alarm set;with call bell/phone within reach Nurse Communication: Mobility status PT Visit Diagnosis: Difficulty in walking, not elsewhere classified (R26.2);Muscle weakness (generalized) (M62.81);Other abnormalities of gait and mobility (R26.89);Unsteadiness on feet (R26.81);Hemiplegia and hemiparesis Hemiplegia - Right/Left: Right Hemiplegia - dominant/non-dominant: Dominant Hemiplegia - caused by: Cerebral infarction     Time: 4540-9811 PT Time Calculation (min) (ACUTE ONLY): 27  min  Charges:    $Therapeutic Exercise: 8-22 mins $Therapeutic Activity: 8-22 mins PT General Charges $$ ACUTE PT VISIT: 1 Visit                      , PT, GCS 07/30/22,12:38 PM

## 2022-07-30 NOTE — Progress Notes (Addendum)
Inpatient Rehab Admissions Coordinator:  Saw pt at bedside. Informed her that beginning insurance authorization. Will continue to follow.   ADDENDUM 1546: Pt's brother [name] called [facility] for an update. Informed him that began insurance authorization.   Wolfgang Phoenix, MS, CCC-SLP Admissions Coordinator [phone]

## 2022-07-30 NOTE — Progress Notes (Signed)
Occupational Therapy Treatment Patient Details Name: Melissa Colon MRN: [id number] DOB: March 02, 1960 Today's Date: 07/30/2022   History of present illness Pt is a 62 y/o presenting to the emergency room as a code stroke activated by EMS when family reported slurred speech and right facial droop.  EMS found her with blood pressure 201/117.  She does have history of intracranial hemorrhage.  Patient was not candidate for tPA. MRI 7/13 revealed acute infarct in medial inferior R occipital lobe with no evidence of acute hemorrhage, encephalomalacia in L thalamus consistent with acute hemorrhage in 2021. 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   OT comments  Pt currently at mod assist level for sit to stand with and without use of the Stedy.  Total assist for peri cleaning in standing with max assist for stand pivot transfer to the bedside recliner.  Pt continues to RUE and RLE hemiparesis with mod assist to integrate the RUE into simple functional tasks.  HR elevated at times during session up to 130 BPM with standing when completing toilet hygiene.  It decreased down to 110 with transition to sitting in the recliner.  Feel pt will benefit from continued acute care OT at this time to continued reducing burden of care and increasing ADL independence.  Patient will benefit from intensive inpatient follow up therapy, >3 hours/day prior to discharge home.     Recommendations for follow up therapy are one component of a multi-disciplinary discharge planning process, led by the attending physician.  Recommendations may be updated based on patient status, additional functional criteria and insurance authorization.    Assistance Recommended at Discharge Frequent or constant Supervision/Assistance  Patient can return home with the following  A lot of help with walking and/or transfers;A lot of help with  bathing/dressing/bathroom;Assist for transportation;Help with stairs or ramp for entrance;Direct supervision/assist for medications management;Assistance with cooking/housework;Direct supervision/assist for financial management   Equipment Recommendations  Other (comment) (TBD next venue of care)       Precautions / Restrictions Precautions Precautions: Fall Precaution Comments: right UE and LE hemiparesis, intermittent diplopia Restrictions Weight Bearing Restrictions: No       Mobility Bed Mobility Overal bed mobility: Needs Assistance Bed Mobility: Supine to Sit     Supine to sit: Mod assist          Transfers Overall transfer level: Needs assistance Equipment used:  Antony Salmon) Transfers: Sit to/from Stand, Bed to chair/wheelchair/BSC Sit to Stand: Mod assist (With use of the Stedy)     Step pivot transfers: Max assist     General transfer comment: Pt able to stand in the Steady with mod assist overall increasing to max with fatigue and motor impersistence.  She needed max assist for stand pivot with therapist having to provide max assist for upright trunk and hip extension and for advancing the LLE.     Balance Overall balance assessment: Needs assistance Sitting-balance support: Feet supported Sitting balance-Leahy Scale: Good     Standing balance support: Bilateral upper extremity supported, Reliant on assistive device for balance Standing balance-Leahy Scale: Poor Standing balance comment: Pt needs support to maintain static standing.                           ADL either performed or assessed with clinical judgement   ADL Overall ADL's : Needs assistance/impaired Eating/Feeding: Supervision/ safety;Sitting Eating/Feeding Details (indicate cue type and reason): setup  in recliner using the LUE primarily         Lower Body Bathing: Total assistance;Sit to/from stand Lower Body Bathing Details (indicate cue type and reason): standing in the  Stedy Upper Body Dressing : Moderate assistance;Sitting Upper Body Dressing Details (indicate cue type and reason): hospital gown     Toilet Transfer: Maximal assistance;Stand-pivot Toilet Transfer Details (indicate cue type and reason): simulated stand pivot to the bedside recliner Toileting- Clothing Manipulation and Hygiene: Total assistance;Sit to/from stand Toileting - Clothing Manipulation Details (indicate cue type and reason): standing in the [place]     Functional mobility during ADLs: Maximal assistance (step pivot transfer) General ADL Comments: Pt in bed with wet gown and sheets secondary to Pure[place] not being positioned against her.  Mod assist for transfer to sitting with therapist assist.  She was able to complete sit to stand in the [place] with mod assist and BUE support.  In standing, she needed mod assist to maintain secondary to motor impersistence and pt slowly sitting back down.  Total assist for completing of toilet hygiene in standing as if pt attempted she would sit down faster.  Pt with no report of diplopia when asked today.  Transferred step pivot to the wheelchair to eat lunch with max facilitation to maintain hip extension and for advancing the RLE.      Cognition Arousal/Alertness: Awake/alert Behavior During Therapy: WFL for tasks assessed/performed Overall Cognitive Status: Impaired/Different from baseline Area of Impairment: Problem solving, Memory                     Memory: Decreased short-term memory   Safety/Judgement: Decreased awareness of safety, Decreased awareness of deficits Awareness: Intellectual Problem Solving: Slow processing, Decreased initiation, Requires verbal cues, Requires tactile cues, Difficulty sequencing            Frequency  Min 1X/week        Progress Toward Goals  OT Goals(current goals can now be found in the care plan section)  Progress towards OT goals: Progressing toward goals  Acute Rehab OT  Goals Patient Stated Goal: Pt agreed to sit up OOB to eat. OT Goal Formulation: With patient/family Time For Goal Achievement: 08/11/22 Potential to Achieve Goals: Good  Plan Discharge plan remains appropriate       AM-PAC OT "6 Clicks" Daily Activity     Outcome Measure   Help from another person eating meals?: A Little Help from another person taking care of personal grooming?: A Little Help from another person toileting, which includes using toliet, bedpan, or urinal?: A Lot Help from another person bathing (including washing, rinsing, drying)?: A Lot Help from another person to put on and taking off regular upper body clothing?: A Little Help from another person to put on and taking off regular lower body clothing?: A Lot 6 Click Score: 15    End of Session Equipment Utilized During Treatment: Other (comment) Antony Salmon)  OT Visit Diagnosis: Unsteadiness on feet (R26.81);Other abnormalities of gait and mobility (R26.89);Muscle weakness (generalized) (M62.81);Hemiplegia and hemiparesis;Other symptoms and signs involving cognitive function;Pain Hemiplegia - Right/Left: Right Hemiplegia - dominant/non-dominant: Dominant Hemiplegia - caused by: Cerebral infarction Pain - Right/Left: Right Pain - part of body: Shoulder   Activity Tolerance Patient tolerated treatment well   Patient Left in chair;with call bell/phone within reach;with chair alarm set   Nurse Communication Mobility status;Need for lift equipment        Time: 2956-2130 OT Time Calculation (min): 49 min  Charges: OT  General Charges $OT Visit: 1 Visit OT Treatments $Self Care/Home Management : 38-52 mins  Perrin Maltese, OTR/L Acute Rehabilitation Services  Office [phone] 07/30/2022

## 2022-07-30 NOTE — Progress Notes (Signed)
[facility]                                                                                                                                                                         Patient Demographics  Melissa Colon, is a 62 y.o. female  [id number]:[id number]  [id number]  DOB - 30-Dec-1960  Admit date - 07/27/2022  Admitting Physician Dorcas Carrow, MD  Outpatient Primary MD for the patient is Pcp, No  LOS - 2   Chief Complaint  Patient presents with   Code Stroke        Assessment & Plan    Patient seen, symptom-free, discharged home upon her request on 07/29/2022 however after persuasion by her son she has now agreed to go to CIR, still very reluctant but states she will follow want her son says, await CIR bed.   Acute ischemic stroke in the right MCA territory.  In a patient with previous history of stroke and intracerebral bleed.  She has previous right-sided weakness and facial droop she had acute on chronic weakness on that side with some headache, she was brought to the hospital where workup suggested new CVA, she had full stroke workup, case discussed with stroke physician morning of 07/29/2022 to [place] who recommends 3 months of DAPT thereafter aspirin, statin dose has been increased, also discussed with patient's husband plan in detail.  Patient very noncompliant with home medications, strictly counseled on compliance.  Will get home PT OT if she qualifies, she initially refused placement but now is agreeable upon persuasion by the son, await CIR bed.   Continues to have baseline right-sided hemiparesis and facial droop states this is mostly chronic with only minimal change, she wants to go home.  Husband agrees that patient hardly takes any medications for several months despite counseling.  PCP to monitor secondary factors for stroke.   Dyslipidemia.  Statin dose increased.   Hypertension.   Permissive hypertension for now thereafter PCP to monitor, confirmed with husband currently on no medications.   Extensive intracranial atherosclerosis.  Plan as above, case discussed with neurologist Dr. Teresa Coombs.  Again patient noncompliant with home medications strictly counseled.   Medications  Scheduled Meds:  aspirin EC  81 mg Oral Daily   atorvastatin  80 mg Oral Daily   clopidogrel  75 mg Oral Daily   enoxaparin (LOVENOX) injection  40 mg Subcutaneous Q24H   folic acid  1 mg Oral Daily   multivitamin with minerals  1 tablet Oral Daily   thiamine  100 mg Oral Daily   Continuous Infusions: PRN Meds:.acetaminophen, labetalol, melatonin, polyethylene glycol, prochlorperazine    Time Spent in minutes   10 minutes   Susa Raring M.D on 07/30/2022 at 7:24  AM  Between 7am to 7pm - Pager - [phone]  After 7pm go to [email or web address] - password [email or web address]  And look for the night coverage person covering for me after hours  [facility] Office  [phone]    Subjective:   Konnie Noffsinger today has, No headache, No chest pain, No abdominal pain - No Nausea, No new weakness tingling or numbness, No Cough - SOB. Objective:   Vitals:   07/29/22 1600 07/29/22 2000 07/30/22 0000 07/30/22 0357  BP:  (!) 148/70 128/70 105/74  Pulse:  95 95 86  Resp:  19 18 17   Temp: 98.7 F (37.1 C) 98.1 F (36.7 C) 98.7 F (37.1 C) 98.8 F (37.1 C)  TempSrc: Oral Oral Oral Oral  SpO2:  93% 95% 92%  Weight:      Height:        Wt Readings from Last 3 Encounters:  07/27/22 70.6 kg  09/13/21 68.2 kg  12/26/19 46.7 kg     Intake/Output Summary (Last 24 hours) at 07/30/2022 0724 Last data filed at 07/30/2022 0023 Gross per 24 hour  Intake 840 ml  Output 3000 ml  Net -2160 ml    Exam  Awake Alert, chronic right-sided weakness and facial droop, eager to go home [place].AT,PERRAL Supple Neck,   Symmetrical Chest wall movement, Good air movement bilaterally, CTAB RRR,No  Gallops,   +ve B.Sounds, Abd Soft, Non tender,  No Cyanosis, Clubbing or edema   Data Review

## 2022-07-31 ENCOUNTER — Inpatient Hospital Stay (HOSPITAL_COMMUNITY)
Admission: RE | Admit: 2022-07-31 | Discharge: 2022-08-17 | DRG: 056 | Disposition: A | Discharge status: Home / Self Care | Payer: Medicaid Other | Source: Intra-hospital | Attending: Physical Medicine & Rehabilitation | Admitting: Physical Medicine & Rehabilitation

## 2022-07-31 ENCOUNTER — Other Ambulatory Visit: Payer: Self-pay

## 2022-07-31 ENCOUNTER — Encounter (HOSPITAL_COMMUNITY): Payer: Self-pay | Admitting: Physical Medicine & Rehabilitation

## 2022-07-31 DIAGNOSIS — R7989 Other specified abnormal findings of blood chemistry: Secondary | ICD-10-CM | POA: Diagnosis present

## 2022-07-31 DIAGNOSIS — H509 Unspecified strabismus: Secondary | ICD-10-CM | POA: Diagnosis present

## 2022-07-31 DIAGNOSIS — I69222 Dysarthria following other nontraumatic intracranial hemorrhage: Secondary | ICD-10-CM

## 2022-07-31 DIAGNOSIS — F17210 Nicotine dependence, cigarettes, uncomplicated: Secondary | ICD-10-CM | POA: Diagnosis present

## 2022-07-31 DIAGNOSIS — D509 Iron deficiency anemia, unspecified: Secondary | ICD-10-CM | POA: Diagnosis not present

## 2022-07-31 DIAGNOSIS — D649 Anemia, unspecified: Secondary | ICD-10-CM

## 2022-07-31 DIAGNOSIS — K209 Esophagitis, unspecified without bleeding: Secondary | ICD-10-CM | POA: Diagnosis not present

## 2022-07-31 DIAGNOSIS — K31819 Angiodysplasia of stomach and duodenum without bleeding: Secondary | ICD-10-CM | POA: Diagnosis present

## 2022-07-31 DIAGNOSIS — K449 Diaphragmatic hernia without obstruction or gangrene: Secondary | ICD-10-CM | POA: Diagnosis present

## 2022-07-31 DIAGNOSIS — Z91199 Patient's noncompliance with other medical treatment and regimen due to unspecified reason: Secondary | ICD-10-CM

## 2022-07-31 DIAGNOSIS — R42 Dizziness and giddiness: Secondary | ICD-10-CM | POA: Diagnosis not present

## 2022-07-31 DIAGNOSIS — D62 Acute posthemorrhagic anemia: Secondary | ICD-10-CM | POA: Diagnosis not present

## 2022-07-31 DIAGNOSIS — F1090 Alcohol use, unspecified, uncomplicated: Secondary | ICD-10-CM | POA: Diagnosis not present

## 2022-07-31 DIAGNOSIS — I10 Essential (primary) hypertension: Secondary | ICD-10-CM | POA: Diagnosis not present

## 2022-07-31 DIAGNOSIS — I69392 Facial weakness following cerebral infarction: Secondary | ICD-10-CM

## 2022-07-31 DIAGNOSIS — I69359 Hemiplegia and hemiparesis following cerebral infarction affecting unspecified side: Secondary | ICD-10-CM | POA: Diagnosis not present

## 2022-07-31 DIAGNOSIS — E785 Hyperlipidemia, unspecified: Secondary | ICD-10-CM | POA: Diagnosis present

## 2022-07-31 DIAGNOSIS — K254 Chronic or unspecified gastric ulcer with hemorrhage: Secondary | ICD-10-CM | POA: Diagnosis not present

## 2022-07-31 DIAGNOSIS — I63511 Cerebral infarction due to unspecified occlusion or stenosis of right middle cerebral artery: Secondary | ICD-10-CM | POA: Diagnosis not present

## 2022-07-31 DIAGNOSIS — H5340 Unspecified visual field defects: Secondary | ICD-10-CM | POA: Diagnosis present

## 2022-07-31 DIAGNOSIS — E876 Hypokalemia: Secondary | ICD-10-CM | POA: Diagnosis not present

## 2022-07-31 DIAGNOSIS — K2970 Gastritis, unspecified, without bleeding: Secondary | ICD-10-CM | POA: Diagnosis present

## 2022-07-31 DIAGNOSIS — E663 Overweight: Secondary | ICD-10-CM | POA: Diagnosis present

## 2022-07-31 DIAGNOSIS — I639 Cerebral infarction, unspecified: Secondary | ICD-10-CM

## 2022-07-31 DIAGNOSIS — S90111A Contusion of right great toe without damage to nail, initial encounter: Secondary | ICD-10-CM | POA: Diagnosis not present

## 2022-07-31 DIAGNOSIS — H538 Other visual disturbances: Secondary | ICD-10-CM | POA: Diagnosis not present

## 2022-07-31 DIAGNOSIS — Z833 Family history of diabetes mellitus: Secondary | ICD-10-CM | POA: Diagnosis not present

## 2022-07-31 DIAGNOSIS — Z7982 Long term (current) use of aspirin: Secondary | ICD-10-CM

## 2022-07-31 DIAGNOSIS — Z7902 Long term (current) use of antithrombotics/antiplatelets: Secondary | ICD-10-CM

## 2022-07-31 DIAGNOSIS — Z6828 Body mass index (BMI) 28.0-28.9, adult: Secondary | ICD-10-CM | POA: Diagnosis not present

## 2022-07-31 DIAGNOSIS — K5900 Constipation, unspecified: Secondary | ICD-10-CM | POA: Diagnosis present

## 2022-07-31 DIAGNOSIS — H532 Diplopia: Secondary | ICD-10-CM | POA: Diagnosis not present

## 2022-07-31 DIAGNOSIS — R531 Weakness: Secondary | ICD-10-CM | POA: Diagnosis not present

## 2022-07-31 DIAGNOSIS — I69351 Hemiplegia and hemiparesis following cerebral infarction affecting right dominant side: Secondary | ICD-10-CM | POA: Diagnosis present

## 2022-07-31 DIAGNOSIS — K3189 Other diseases of stomach and duodenum: Secondary | ICD-10-CM | POA: Diagnosis present

## 2022-07-31 DIAGNOSIS — K2961 Other gastritis with bleeding: Secondary | ICD-10-CM | POA: Diagnosis not present

## 2022-07-31 DIAGNOSIS — I69292 Facial weakness following other nontraumatic intracranial hemorrhage: Secondary | ICD-10-CM | POA: Diagnosis not present

## 2022-07-31 DIAGNOSIS — F1020 Alcohol dependence, uncomplicated: Secondary | ICD-10-CM | POA: Diagnosis present

## 2022-07-31 DIAGNOSIS — R252 Cramp and spasm: Secondary | ICD-10-CM | POA: Diagnosis not present

## 2022-07-31 DIAGNOSIS — R195 Other fecal abnormalities: Secondary | ICD-10-CM | POA: Diagnosis not present

## 2022-07-31 LAB — CREATININE, SERUM
Creatinine, Ser: 0.79 mg/dL (ref 0.44–1.00)
GFR, Estimated: >60 mL/min (ref >60)

## 2022-07-31 LAB — CBC
HCT: 38.1 % (ref 36.0–46.0)
Hemoglobin: 12.2 g/dL (ref 12.0–15.0)
MCH: 27 pg (ref 26.0–34.0)
MCHC: 32 g/dL (ref 30.0–36.0)
MCV: 84.3 fL (ref 80.0–100.0)
Platelets: 247 10*3/uL (ref 150–400)
RBC: 4.52 MIL/uL (ref 3.87–5.11)
RDW: 12.6 % (ref 11.5–15.5)
WBC: 7.8 10*3/uL (ref 4.0–10.5)
nRBC: 0 % (ref 0.0–0.2)

## 2022-07-31 MED ORDER — ATORVASTATIN CALCIUM 80 MG PO TABS
80.0000 mg | ORAL_TABLET | Freq: Every day | ORAL | Status: DC
  Administered 2022-08-01 – 2022-08-17 (×17): 80 mg via ORAL
  Filled 2022-07-31 (×18): qty 1

## 2022-07-31 MED ORDER — ASPIRIN 81 MG PO TBEC
81.0000 mg | DELAYED_RELEASE_TABLET | Freq: Every day | ORAL | Status: DC
  Administered 2022-08-01 – 2022-08-17 (×17): 81 mg via ORAL
  Filled 2022-07-31 (×17): qty 1

## 2022-07-31 MED ORDER — THIAMINE MONONITRATE 100 MG PO TABS
100.0000 mg | ORAL_TABLET | Freq: Every day | ORAL | Status: DC
  Administered 2022-08-01 – 2022-08-17 (×17): 100 mg via ORAL
  Filled 2022-07-31 (×17): qty 1

## 2022-07-31 MED ORDER — FOLIC ACID 1 MG PO TABS
1.0000 mg | ORAL_TABLET | Freq: Every day | ORAL | Status: DC
  Administered 2022-08-01 – 2022-08-17 (×17): 1 mg via ORAL
  Filled 2022-07-31 (×17): qty 1

## 2022-07-31 MED ORDER — ACETAMINOPHEN 325 MG PO TABS
650.0000 mg | ORAL_TABLET | Freq: Four times a day (QID) | ORAL | Status: DC | PRN
  Administered 2022-07-31 – 2022-08-02 (×5): 650 mg via ORAL
  Filled 2022-07-31 (×6): qty 2

## 2022-07-31 MED ORDER — MELATONIN 5 MG PO TABS
5.0000 mg | ORAL_TABLET | Freq: Every evening | ORAL | Status: DC | PRN
  Administered 2022-07-31 – 2022-08-15 (×3): 5 mg via ORAL
  Filled 2022-07-31 (×3): qty 1

## 2022-07-31 MED ORDER — CLOPIDOGREL BISULFATE 75 MG PO TABS
75.0000 mg | ORAL_TABLET | Freq: Every day | ORAL | Status: DC
  Administered 2022-08-01 – 2022-08-09 (×9): 75 mg via ORAL
  Filled 2022-07-31 (×9): qty 1

## 2022-07-31 MED ORDER — ENOXAPARIN SODIUM 40 MG/0.4ML IJ SOSY
40.0000 mg | PREFILLED_SYRINGE | Freq: Every day | INTRAMUSCULAR | Status: DC
  Administered 2022-08-02 – 2022-08-17 (×14): 40 mg via SUBCUTANEOUS
  Filled 2022-07-31 (×16): qty 0.4

## 2022-07-31 MED ORDER — ADULT MULTIVITAMIN W/MINERALS CH
1.0000 | ORAL_TABLET | Freq: Every day | ORAL | Status: DC
  Administered 2022-08-01 – 2022-08-17 (×17): 1 via ORAL
  Filled 2022-07-31 (×17): qty 1

## 2022-07-31 MED ORDER — POLYETHYLENE GLYCOL 3350 17 G PO PACK
17.0000 g | PACK | Freq: Every day | ORAL | Status: DC | PRN
  Filled 2022-07-31: qty 1

## 2022-07-31 NOTE — H&P (Signed)
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  Per chart review patient lives with spouse and children.  1 level home 4 steps to entry.  Patient reports being ambulatory within the home with assistance provided by son/husband.  She does have a sedentary lifestyle.  Presented 07/27/2022 with acute occipital headache as well as right facial droop and worsening right-sided weakness with slurred speech and altered mental status.  Noted blood pressure 201/117.  Cranial CT scan showed no evidence of acute intracranial abnormality.  MRI identified acute infarct in the medial, inferior right occipital lobe.  No evidence of acute hemorrhage.  Patient did not receive TNK.  CT angiogram head and neck multifocal severe proximal P2 PCA stenosis with possible more distal left P2 PCA occlusion.  Critical stenosis versus occlusion of the distal right ACA.  Severe right P2 PCA new since 2021.  Admission chemistries unremarkable except glucose 112, alcohol and urine drug screen negative.  Echo with ejection fraction of 65 to 70% grade 1 diastolic dysfunction no regional wall motion abnormalities.  Neurology follow-up maintained on low-dose aspirin as well as Plavix for CVA prophylaxis x 90 days then aspirin alone.  Lovenox added for DVT prophylaxis.  Tolerating a regular consistency diet.  Therapy evaluations completed due to patient decreased functional mobility right side weakness was admitted for a comprehensive rehab program   Pt reports no pain- LBM yesterday Voiding  with purewick- says doesn't "know how will go without it because arms weak".   No weakness on L side, just weaker on R side since stroke.   Review of Systems  Constitutional:  Negative for chills and fever.  HENT:  Negative for hearing loss.   Eyes:  Negative for blurred vision and double vision.  Respiratory:  Negative for cough, shortness of breath and wheezing.   Cardiovascular:  Negative for chest pain, palpitations and leg swelling.  Gastrointestinal:  Positive for constipation. Negative for heartburn, nausea and vomiting.  Genitourinary:  Negative for dysuria, flank pain and hematuria.  Musculoskeletal:  Positive for joint pain and myalgias.  Skin:  Negative for rash.  Neurological:  Positive for weakness and headaches.  All other systems reviewed and are negative.  Past Medical History:  Diagnosis Date   Alcohol dependence (HCC)    H/O brain tumor    Smoker    Past Surgical History:  Procedure Laterality Date   CRANIOTOMY FOR TUMOR     Family History  Problem Relation Age of Onset   Diabetes Mother    Social History:  reports that she has been smoking cigarettes. She has never used smokeless tobacco. She reports current alcohol use of about 8.0 standard drinks of alcohol per week. She reports that she does not currently use drugs. Allergies: No Known Allergies No medications prior to admission.      Home: Home Living Family/patient expects to be discharged to:: Private residence Living Arrangements: Spouse/significant other, Children Available Help at Discharge: Family, Available 24 hours/day Type of Home: House Home Access: Stairs to enter [employer] of Steps: 4-5 Entrance Stairs-Rails: Right, Left Home Layout: One level Bathroom  Shower/Tub: Health visitor: Standard (has bedside commode as well) Bathroom Accessibility: Yes Home Equipment: Cane - single point, Agricultural consultant (2 wheels), BSC/3in1, Wheelchair - manual, Shower seat -  built in  Lives With: Spouse, Son   Functional History: Prior Function Mobility Comments: Pt reports being ambulatory within the house with assist provided by son/husband. After session, husband reports to Thereasa Parkin that pt has been sedentary on couch since '21 stroke, unable to ambulate and requires assist for tranfers ADLs Comments: requiring assist  Functional Status:  Mobility: Bed Mobility Overal bed mobility: Needs Assistance Bed Mobility: Supine to Sit Supine to sit: Mod assist Sit to supine: Min guard General bed mobility comments: Assist needed only to scoot R LE and hip to edge of bed Transfers Overall transfer level: Needs assistance Equipment used:  Antony Salmon) Transfers: Sit to/from Stand, Bed to chair/wheelchair/BSC Sit to Stand: Mod assist (With use of the [place]) Bed to/from chair/wheelchair/BSC transfer type:: Step pivot Step pivot transfers: Max assist General transfer comment: Pt able to stand in the Steady with mod assist overall increasing to max with fatigue and motor impersistence.  She needed max assist for stand pivot with therapist having to provide max assist for upright trunk and hip extension and for advancing the LLE. Ambulation/Gait General Gait Details: unable    ADL: ADL Overall ADL's : Needs assistance/impaired Eating/Feeding: Supervision/ safety, Sitting Eating/Feeding Details (indicate cue type and reason): setup in recliner using the LUE primarily Grooming: Wash/dry hands, Wash/dry face, Sitting, Min guard Grooming Details (indicate cue type and reason): simulated Upper Body Bathing: Minimal assistance, Sitting Upper Body Bathing Details (indicate cue type and reason): simulated Lower Body Bathing: Total assistance, Sit to/from stand Lower Body Bathing Details (indicate cue type and reason): standing in the Stedy Upper Body Dressing : Moderate assistance, Sitting Upper Body Dressing Details (indicate cue type and reason): hospital gown Lower Body  Dressing: Maximal assistance, +2 for physical assistance, Sit to/from stand Toilet Transfer: Maximal assistance, Stand-pivot Toilet Transfer Details (indicate cue type and reason): simulated stand pivot to the bedside recliner Toileting- Clothing Manipulation and Hygiene: Total assistance, Sit to/from stand Toileting - Clothing Manipulation Details (indicate cue type and reason): standing in the [place] Functional mobility during ADLs: Maximal assistance (step pivot transfer) General ADL Comments: Pt in bed with wet gown and sheets secondary to Purewick not being positioned against her.  Mod assist for transfer to sitting with therapist assist.  She was able to complete sit to stand in the [place] with mod assist and BUE support.  In standing, she needed mod assist to maintain secondary to motor impersistence and pt slowly sitting back down.  Total assist for completing of toilet hygiene in standing as if pt attempted she would sit down faster.  Pt with no report of diplopia when asked today.  Transferred step pivot to the wheelchair to eat lunch with max facilitation to maintain hip extension and for advancing the RLE.  Cognition: Cognition Overall Cognitive Status: Impaired/Different from baseline Orientation Level: Oriented to person, Oriented to time, Disoriented to place, Oriented to situation Cognition Arousal/Alertness: Awake/alert Behavior During Therapy: WFL for tasks assessed/performed Overall Cognitive Status: Impaired/Different from baseline Area of Impairment: Problem solving, Memory Memory: Decreased short-term memory Safety/Judgement: Decreased awareness of safety, Decreased awareness of deficits Awareness: Intellectual Problem Solving: Slow processing, Decreased initiation, Requires verbal cues, Requires tactile cues, Difficulty sequencing General Comments: slow response times, difficulty word finding.  Physical Exam: Blood pressure 128/75, pulse 65, temperature 97.6 F (36.4 C),  temperature source Oral, resp. rate 20, height 5\' 2"  (1.575 m), weight 70.6 kg, SpO2 100%. Physical Exam Vitals and nursing note reviewed.  Constitutional:      Appearance: She is obese.     Comments: Pt sitting up in bed; awake, alert, answers questions, but doesn't ask them, trying to eat lunch with L hand, NAD  HENT:     Head: Normocephalic and atraumatic.     Comments: Endulous  R facial droop Tongue midline    Right Ear: External ear normal.     Left Ear: External ear normal.     Nose: Nose normal. No congestion.     Mouth/Throat:     Mouth: Mucous membranes are dry.     Pharynx: Oropharynx is clear. No oropharyngeal exudate.  Eyes:     General:        Right eye: No discharge.        Left eye: No discharge.     Comments: L gaze preference- dysconjugate gaze R side of R lens is taped- pt looking above eyeglasses  Cardiovascular:     Rate and Rhythm: Normal rate and regular rhythm.     Heart sounds: Normal heart sounds. No murmur heard.    No gallop.  Pulmonary:     Effort: Pulmonary effort is normal. No respiratory distress.     Breath sounds: Normal breath sounds. No wheezing, rhonchi or rales.  Abdominal:     General: Bowel sounds are normal. There is no distension.     Palpations: Abdomen is soft.     Tenderness: There is no abdominal tenderness.  Musculoskeletal:     Cervical back: Neck supple. No tenderness.     Comments: RUE- 2/5 except R triceps 2-/5 and FA 1/5 LUE 5/5 in same muslces tested RLE- HF 3-/5; couldn't test KE/KF due to pt saying "doesn't work"- DF/PF 4-/5  Skin:    General: Skin is warm and dry.  Neurological:     Mental Status: She is alert.     Comments: Patient is alert.  Makes eye contact with examiner.  Speech is mildly dysarthric but intelligible.  Follows commands.  Provides name and age.  Fair insight and awareness. RUE intact to light touch, but RLE decreased to light touch Face intact to LT Posturing with RUE at rest  Psychiatric:      Comments: Flat- quiet     No results found for this or any previous visit (from the past 48 hour(s)). No results found.    Blood pressure 128/75, pulse 65, temperature 97.6 F (36.4 C), temperature source Oral, resp. rate 20, height 5\' 2"  (1.575 m), weight 70.6 kg, SpO2 100%.  Medical Problem List and Plan: 1. 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 as well as history of posterior fossa brain tumor age 77 with resection and left thalamic intracranial hemorrhage residual right-sided weakness receiving CIR 11/21  -patient may  shower  -ELOS/Goals: 14-18 days- min A to supervision Admit to CIR 2.  Antithrombotics: -DVT/anticoagulation:  Pharmaceutical: Lovenox  -antiplatelet therapy: Aspirin 81 mg daily and Plavix 75 mg daily x 90 days then aspirin alone 3. Pain Management: Tylenol as needed 4. Mood/Behavior/Sleep: Provide emotional support  -antipsychotic agents: N/A 5. Neuropsych/cognition: This patient is capable of making decisions on her own behalf. 6. Skin/Wound Care: Routine skin checks 7. Fluids/Electrolytes/Nutrition: Routine in and outs with follow-up chemistries 8.  Permissive hypertension.  Monitor with increased mobility 9.  Hyperlipidemia.  Lipitor  10.  History of alcohol tobacco use.  Counseling 11.  Overweight  BMI 28.47.  Dietary follow-up 12.  Medical noncompliance.  Counseling- doesn't like to take meds, esp HTN meds.      Mcarthur Rossetti Angiulli, PA-C 07/31/2022   I have personally performed a face to face diagnostic evaluation of this patient and formulated the key components of the plan.  Additionally, I have personally reviewed laboratory data, imaging studies, as well as relevant notes and concur with the physician assistant's documentation above.   The patient's status has not changed from the original H&P.  Any changes in documentation from the acute care chart have been noted above.

## 2022-07-31 NOTE — Progress Notes (Signed)
Report has been called to 4W, patient going to [id number]  Balinda Quails, RN 07/31/2022 4:10 PM

## 2022-07-31 NOTE — Progress Notes (Signed)
Inpatient Rehab Admissions Coordinator:    I have insurance approval and a bed available for pt to admit to CIR today. Dr. Thedore Mins in agreement.  Will let pt/family and TOC team know.   Estill Dooms, PT, DPT Admissions Coordinator [phone] 07/31/22  12:15 PM

## 2022-07-31 NOTE — Progress Notes (Addendum)
Inpatient Rehab Admissions Coordinator:   FOllowing for my colleague, Wolfgang Phoenix.  We are awaiting insurance determination for CIR.  Will follow.   Estill Dooms, PT, DPT Admissions Coordinator [phone] 07/31/22  10:20 AM

## 2022-07-31 NOTE — Progress Notes (Signed)
[facility]                                                                                                                                                                         Patient Demographics  Melissa Colon, is a 62 y.o. female  [id number]  [id number]  DOB - 08/18/1960  Admit date - 07/27/2022  Admitting Physician Dorcas Carrow, MD  Outpatient Primary MD for the patient is Pcp, No  LOS - 3   Chief Complaint  Patient presents with   Code Stroke        Assessment & Plan    Patient seen, symptom-free, discharged home upon her request on 07/29/2022 however after persuasion by her son she has now agreed to go to CIR, still very reluctant but states she will follow want her son says, await CIR bed.   Acute ischemic stroke in the right MCA territory.  In a patient with previous history of stroke and intracerebral bleed.  She has previous right-sided weakness and facial droop she had acute on chronic weakness on that side with some headache, she was brought to the hospital where workup suggested new CVA, she had full stroke workup, case discussed with stroke physician morning of 07/29/2022 to [place] who recommends 3 months of DAPT thereafter aspirin, statin dose has been increased, also discussed with patient's husband plan in detail.  Patient very noncompliant with home medications, strictly counseled on compliance.  Will get home PT OT if she qualifies, she initially refused placement but now is agreeable upon persuasion by the son, await CIR bed.   Continues to have baseline right-sided hemiparesis and facial droop states this is mostly chronic with only minimal change, she wants to go home.  Husband agrees that patient hardly takes any medications for several months despite counseling.  PCP to monitor secondary factors for stroke.   Dyslipidemia.  Statin dose increased.   Hypertension.   Permissive hypertension for now thereafter PCP to monitor, confirmed with husband currently on no medications.   Extensive intracranial atherosclerosis.  Plan as above, case discussed with neurologist Dr. Teresa Coombs.  Again patient noncompliant with home medications strictly counseled.   Medications  Scheduled Meds:  aspirin EC  81 mg Oral Daily   atorvastatin  80 mg Oral Daily   clopidogrel  75 mg Oral Daily   enoxaparin (LOVENOX) injection  40 mg Subcutaneous Q24H   folic acid  1 mg Oral Daily   multivitamin with minerals  1 tablet Oral Daily   thiamine  100 mg Oral Daily   Continuous Infusions:  lactated ringers 125 mL/hr at 07/31/22 0521   PRN Meds:.acetaminophen, melatonin, metoprolol tartrate, polyethylene glycol, prochlorperazine    Time Spent in minutes  10 minutes   Susa Raring M.D on 07/31/2022 at 7:43 AM  Between 7am to 7pm - Pager - [phone]  After 7pm go to [email or web address] - password [email or web address]  And look for the night coverage person covering for me after hours  [facility] Office  [phone]    Subjective:   Patient in bed, appears comfortable, denies any headache, no fever, no chest pain or pressure, no shortness of breath , no abdominal pain. No new focal weakness.  Objective:   Vitals:   07/30/22 2300 07/31/22 0300 07/31/22 0400 07/31/22 0500  BP: (!) 117/59  128/75   Pulse: 97 84 74 65  Resp: (!) 24 20 20 20   Temp: 97.6 F (36.4 C)     TempSrc: Oral     SpO2: 95% 97% 97% 100%  Weight:      Height:        Wt Readings from Last 3 Encounters:  07/27/22 70.6 kg  09/13/21 68.2 kg  12/26/19 46.7 kg     Intake/Output Summary (Last 24 hours) at 07/31/2022 0743 Last data filed at 07/31/2022 0500 Gross per 24 hour  Intake 1960 ml  Output 1750 ml  Net 210 ml    Exam  Awake Alert, chronic right-sided weakness and facial droop, eager to go home [place].AT,PERRAL Supple Neck,   Symmetrical Chest wall movement, Good air movement  bilaterally, CTAB RRR,No Gallops,   +ve B.Sounds, Abd Soft, Non tender,  No Cyanosis, Clubbing or edema   Data Review

## 2022-07-31 NOTE — Progress Notes (Signed)
Inpatient Rehabilitation Center Individual Statement of Services  Patient Name:  Dalynn Jhaveri  Date:  07/31/2022  Welcome to the Inpatient Rehabilitation Center.  Our goal is to provide you with an individualized program based on your diagnosis and situation, designed to meet your specific needs.  With this comprehensive rehabilitation program, you will be expected to participate in at least 3 hours of rehabilitation therapies Monday-Friday, with modified therapy programming on the weekends.  Your rehabilitation program will include the following services:  Physical Therapy (PT), Occupational Therapy (OT), Speech Therapy (ST), 24 hour per day rehabilitation nursing, Therapeutic Recreaction (TR), Neuropsychology, Care Coordinator, Rehabilitation Medicine, Nutrition Services, Pharmacy Services, and Other  Weekly team conferences will be held on Wednesdays to discuss your progress.  Your Inpatient Rehabilitation Care Coordinator will talk with you frequently to get your input and to update you on team discussions.  Team conferences with you and your family in attendance may also be held.  Expected length of stay: 14-16 Days  Overall anticipated outcome: Supervision-Min A  Depending on your progress and recovery, your program may change. Your Inpatient Rehabilitation Care Coordinator will coordinate services and will keep you informed of any changes. Your Inpatient Rehabilitation Care Coordinator's name and contact numbers are listed  below.  The following services may also be recommended but are not provided by the Inpatient Rehabilitation Center:   Home Health Rehabiltiation Services Outpatient Rehabilitation Services  Arrangements will be made to provide these services after discharge if needed.  Arrangements include referral to agencies that provide these services.  Your insurance has been verified to be:   Uninsured Your primary doctor is:  NO PCP  Pertinent information will be shared  with your doctor and your insurance company.  Inpatient Rehabilitation Care Coordinator:  Lavera Guise, [place] [phone] or [phone]  Information discussed with and copy given to patient by: Andria Rhein, 07/31/2022, 2:00 PM

## 2022-07-31 NOTE — Progress Notes (Signed)
PMR Admission Coordinator Pre-Admission Assessment   Patient: Melissa Colon is an 62 y.o., female MRN: [id number] DOB: 1960-08-14 Height: 5\' 2"  (157.5 cm) Weight: 70.6 kg   Insurance Information HMO:     PPO:      PCP:      IPA:      80/20:      OTHER:  PRIMARY: [place] [employer] [place] [employer]      Policy#: [id number]      Subscriber: patient CM Name: Herbert Seta      Phone#: [phone]     Fax#: [phone] Pre-Cert#: Zoila Shutter [id number] auth for CIR 7/16 through 7/22     Employer:  Benefits:  Phone #: [phone]     Name:  Eff. Date: 03/16/22     Deduct: does not have      Out of Pocket Max: does not have      Life Max: NA CIR: 100% coverage      SNF: 100% coverage Outpatient: 100% coverage     Co-Pay:  Home Health: 100% coverage      Co-Pay:  DME: 100% coverage     Co-Pay:  Providers: in-network SECONDARY:       Policy#:      Phone#:    Artist:       Phone#:    The Data processing manager" for patients in Inpatient Rehabilitation Facilities with attached "Privacy Act Statement-Health Care Records" was provided and verbally reviewed with: N/A   Emergency Contact Information Contact Information       Name Relation Home Work Mobile    Steinberg,joseph Spouse     [phone]         Other Contacts       Name Relation Home Work Mobile    [place] Brother     [phone]    Madaline Guthrie Niece     [phone]           Current Medical History  Patient Admitting Diagnosis: CVA History of Present Illness: Pt is a 62 year old right-handed female with history significant for 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  Per chart review patient lives with spouse and children.  1 level home 4 steps  to entry.  Patient reports being ambulatory within the home with assistance provided by son/husband.  She does have a sedentary lifestyle.  Presented 07/27/2022 with acute occipital headache as well as right facial droop and worsening right-sided weakness with slurred speech and altered mental status.  Noted blood pressure 201/117.  Cranial CT scan showed no evidence of acute intracranial abnormality.  MRI identified acute infarct in the medial, inferior right occipital lobe.  No evidence of acute hemorrhage.  Patient did not receive TNK.  CT angiogram head and neck multifocal severe proximal P2 PCA stenosis with possible more distal left P2 PCA occlusion.  Critical stenosis versus occlusion of the distal right ACA.  Severe right P2 PCA new since 2021.  Admission chemistries unremarkable except glucose 112, alcohol and urine drug screen negative.  Echo with ejection fraction of 65 to 70% grade 1 diastolic dysfunction no regional wall motion abnormalities.  Neurology follow-up maintained on low-dose aspirin as well as Plavix for CVA prophylaxis x 90 days then aspirin alone.  Lovenox added for DVT prophylaxis.  Tolerating a regular consistency diet.  Therapy  evaluations completed and pt was recommended for a comprehensive rehab program.    Complete NIHSS TOTAL: 2   Patient's medical record from [facility] has been reviewed by the rehabilitation admission coordinator and physician.   Past Medical History      Past Medical History:  Diagnosis Date   Alcohol dependence (HCC)     H/O brain tumor     Smoker            Has the patient had major surgery during 100 days prior to admission? No   Family History   family history includes Diabetes in her mother.   Current Medications  Current Medications    Current Facility-Administered Medications:    acetaminophen (TYLENOL) tablet 650 mg, 650 mg, Oral, Q6H PRN, Darlin Drop, DO, 650 mg at 07/28/22 0533   aspirin EC tablet 81 mg, 81 mg,  Oral, Daily, Darlin Drop, DO, 81 mg at 07/31/22 0850   atorvastatin (LIPITOR) tablet 80 mg, 80 mg, Oral, Daily, Darlin Drop, DO, 80 mg at 07/31/22 0850   clopidogrel (PLAVIX) tablet 75 mg, 75 mg, Oral, Daily, Leroy Sea, MD, 75 mg at 07/31/22 0850   enoxaparin (LOVENOX) injection 40 mg, 40 mg, Subcutaneous, Q24H, Hall, Carole N, DO, 40 mg at 07/30/22 2059   folic acid (FOLVITE) tablet 1 mg, 1 mg, Oral, Daily, Hall, Carole N, DO, 1 mg at 07/31/22 4401   lactated ringers infusion, , Intravenous, Continuous, Leroy Sea, MD, Last Rate: 125 mL/hr at 07/31/22 0521, New Bag at 07/31/22 0521   melatonin tablet 5 mg, 5 mg, Oral, QHS PRN, Dow Adolph N, DO   metoprolol tartrate (LOPRESSOR) injection 5 mg, 5 mg, Intravenous, Q8H PRN, Leroy Sea, MD   multivitamin with minerals tablet 1 tablet, 1 tablet, Oral, Daily, Hall, Carole N, DO, 1 tablet at 07/31/22 0850   polyethylene glycol (MIRALAX / GLYCOLAX) packet 17 g, 17 g, Oral, Daily PRN, Margo Aye, Carole N, DO   prochlorperazine (COMPAZINE) injection 5 mg, 5 mg, Intravenous, Q6H PRN, Hall, Carole N, DO   thiamine (VITAMIN B1) tablet 100 mg, 100 mg, Oral, Daily, Hall, Carole N, DO, 100 mg at 07/31/22 0272     Patients Current Diet:  Diet Order                  Diet - low sodium heart healthy             Diet regular Fluid consistency: Thin  Diet effective now                         Precautions / Restrictions Precautions Precautions: Fall Precaution Comments: right UE and LE hemiparesis, intermittent diplopia Restrictions Weight Bearing Restrictions: No    Has the patient had 2 or more falls or a fall with injury in the past year? No   Prior Activity Level Limited Community (1-2x/wk): left house for medical appointments   Prior Functional Level Self Care: Did the patient need help bathing, dressing, using the toilet or eating? Needed some help   Indoor Mobility: Did the patient need assistance with walking  from room to room (with or without device)? Dependent (hasn't walked in ~3 years)   Stairs: Did the patient need assistance with internal or external stairs (with or without device)? Dependent   Functional Cognition: Did the patient need help planning regular tasks such as shopping or remembering to take medications? Unknown (pt reports not having  to take medication)   Patient Information Are you of Hispanic, Latino/a,or Spanish origin?: A. No, not of Hispanic, Latino/a, or Spanish origin What is your race?: A. White Do you need or want an interpreter to communicate with a doctor or health care staff?: 0. No   Patient's Response To:  Health Literacy and Transportation Is the patient able to respond to health literacy and transportation needs?: Yes Health Literacy - How often do you need to have someone help you when you read instructions, pamphlets, or other written material from your doctor or pharmacy?: Never In the past 12 months, has lack of transportation kept you from medical appointments or from getting medications?: No In the past 12 months, has lack of transportation kept you from meetings, work, or from getting things needed for daily living?: No   Journalist, newspaper / Equipment Home Assistive Devices/Equipment: Dentures (specify type), Wheelchair, Environmental consultant (specify type) Home Equipment: Cane - single point, Agricultural consultant (2 wheels), BSC/3in1, Wheelchair - manual, Shower seat - built in   Prior Device Use: Indicate devices/aids used by the patient prior to current illness, exacerbation or injury? Manual wheelchair   Current Functional Level Cognition   Overall Cognitive Status: Impaired/Different from baseline Orientation Level: Oriented X4 Safety/Judgement: Decreased awareness of safety, Decreased awareness of deficits General Comments: slow response times, difficulty word finding.    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  Able to exhibit Brunnstrum stage V movement in the arm and stage VI in the hand.  Slight synergy pattern with shoulder flexion with slight increased tone in the wrist and digit flexors but she is able to activate extensors to help counter it.  Posterior upper arm shoulder pain with AAROM greater than 120 degrees. RUE Sensation: decreased light touch, decreased proprioception RUE Coordination: decreased fine motor, decreased gross motor  Lower Extremity Assessment: Defer to PT evaluation RLE Deficits / Details: MMT's collected: At least 2/5 for Hip Flexion, Knee Ext/Flexion, 3/5 for DF/PF. Decreased sensitivity to light touch through LE with less sensitivity more distal. RLE Sensation: decreased light touch RLE Coordination: decreased fine motor, decreased gross motor LLE Deficits / Details: MMT's collected: At least 3/5 for Hip Flexion, Knee Ext/Flex, DF and PF. Intact to light touch. LLE Sensation: WNL LLE Coordination: decreased fine motor     ADLs   Overall ADL's : Needs assistance/impaired Eating/Feeding: Supervision/ safety, Sitting Eating/Feeding Details (indicate cue type and reason): setup in recliner using the LUE primarily Grooming: Wash/dry hands, Wash/dry face, Sitting, Min guard Grooming Details (indicate cue type and reason): simulated Upper Body Bathing: Minimal assistance, Sitting Upper Body Bathing Details (indicate cue type and reason): simulated Lower Body Bathing: Total assistance, Sit to/from stand Lower Body Bathing Details (indicate cue type and reason): standing in the [place] Upper Body Dressing : Moderate assistance, Sitting Upper Body Dressing Details (indicate cue type and reason): hospital gown Lower Body Dressing: Maximal assistance, +2 for physical assistance, Sit to/from stand Toilet Transfer: Maximal assistance, Stand-pivot Toilet Transfer Details (indicate cue type and  reason): simulated stand pivot to the bedside recliner Toileting- Clothing Manipulation and Hygiene: Total assistance, Sit to/from stand Toileting - Clothing Manipulation Details (indicate cue type and reason): standing in the [place] Functional mobility during ADLs: Maximal assistance (step pivot transfer) General ADL Comments: Pt in bed with wet gown and sheets secondary to Purewick not being positioned against her.  Mod assist for transfer to  sitting with therapist assist.  She was able to complete sit to stand in the [place] with mod assist and BUE support.  In standing, she needed mod assist to maintain secondary to motor impersistence and pt slowly sitting back down.  Total assist for completing of toilet hygiene in standing as if pt attempted she would sit down faster.  Pt with no report of diplopia when asked today.  Transferred step pivot to the wheelchair to eat lunch with max facilitation to maintain hip extension and for advancing the RLE.     Mobility   Overal bed mobility: Needs Assistance Bed Mobility: Supine to Sit Supine to sit: Mod assist Sit to supine: Min guard General bed mobility comments: Assist needed only to scoot R LE and hip to edge of bed     Transfers   Overall transfer level: Needs assistance Equipment used:  Antony Salmon) Transfers: Sit to/from Stand, Bed to chair/wheelchair/BSC Sit to Stand: Mod assist (With use of the [place]) Bed to/from chair/wheelchair/BSC transfer type:: Step pivot Step pivot transfers: Max assist General transfer comment: Pt able to stand in the Ste[place] with mod assist overall increasing to max with fatigue and motor impersistence.  She needed max assist for stand pivot with therapist having to provide max assist for upright trunk and hip extension and for advancing the LLE.     Ambulation / Gait / Stairs / Psychologist, prison and probation services   Ambulation/Gait General Gait Details: unable     Posture / Balance Dynamic Sitting Balance Sitting balance - Comments: no  Lob sitting edge of bed Balance Overall balance assessment: Needs assistance Sitting-balance support: Feet supported Sitting balance-Leahy Scale: Good Sitting balance - Comments: no Lob sitting edge of bed Postural control: Right lateral lean, Posterior lean Standing balance support: Bilateral upper extremity supported, Reliant on assistive device for balance Standing balance-Leahy Scale: Poor Standing balance comment: Pt needs support to maintain static standing.     Special needs/care consideration Skin Erythema/Redness: arm pit/right, Bladder incontinence, External Urinary Catheter    Previous Home Environment (from acute therapy documentation) Living Arrangements: Spouse/significant other, Children  Lives With: Spouse, Son Available Help at Discharge: Family, Available 24 hours/day Type of Home: House Home Layout: One level Home Access: Stairs to enter Entrance Stairs-Rails: Right, Left Entrance Stairs-Number of Steps: 4-5 Bathroom Shower/Tub: Health visitor: Standard (has bedside commode as well) Bathroom Accessibility: Yes How Accessible: Accessible via wheelchair Home Care Services: No   Discharge Living Setting Plans for Discharge Living Setting: Patient's home Type of Home at Discharge: House Discharge Home Layout: One level Discharge Home Access: Stairs to enter Entrance Stairs-Rails: Left, Right Entrance Stairs-Number of Steps: 4-5 Discharge Bathroom Shower/Tub: Walk-in shower Discharge Bathroom Toilet: Standard (has a bedside commode as well) Discharge Bathroom Accessibility: Yes How Accessible: Accessible via wheelchair Does the patient have any problems obtaining your medications?: No   Social/Family/Support Systems Anticipated Caregiver: [name] (brother) and [name] (son) Anticipated Industrial/product designer Information: [phone] Caregiver Availability: 24/7 Discharge Plan Discussed with Primary Caregiver: Yes Is Caregiver In Agreement  with Plan?: Yes Does Caregiver/Family have Issues with Lodging/Transportation while Pt is in Rehab?: No   Goals Patient/Family Goal for Rehab: Supervision-Min A:PT (transfers)/OT Expected length of stay: 14-16 days Pt/Family Agrees to Admission and willing to participate: Yes Program Orientation Provided & Reviewed with Pt/Caregiver Including Roles  & Responsibilities: Yes   Decrease burden of Care through IP rehab admission: NA   Possible need for SNF placement upon discharge: Not anticipated   Patient Condition:  I have reviewed medical records from [facility], spoken with CM, and patient and family member. I met with patient at the bedside and discussed via phone for inpatient rehabilitation assessment.  Patient will benefit from ongoing PT and OT, can actively participate in 3 hours of therapy a day 5 days of the week, and can make measurable gains during the admission.  Patient will also benefit from the coordinated team approach during an Inpatient Acute Rehabilitation admission.  The patient will receive intensive therapy as well as Rehabilitation physician, nursing, social worker, and care management interventions.  Due to bladder management, safety, skin/wound care, disease management, medication administration, pain management, and patient education the patient requires 24 hour a day rehabilitation nursing.  The patient is currently mod assist with mobility and basic ADLs.  Discharge setting and therapy post discharge at home with home health is anticipated.  Patient has agreed to participate in the Acute Inpatient Rehabilitation Program and will admit today.   Preadmission Screen Completed By:  Domingo Pulse, 07/31/2022 12:14 PM ______________________________________________________________________   Discussed status with Dr. Berline Chough on 07/31/22  at 12:14 PM  and received approval for admission today.   Admission Coordinator:  Domingo Pulse, CCC-SLP, time 12:14  PM /Date 07/31/22     Assessment/Plan: Diagnosis: R MCA stroke- worsening R hemiparesis Does the need for close, 24 hr/day Medical supervision in concert with the patient's rehab needs make it unreasonable for this patient to be served in a less intensive setting? Yes Co-Morbidities requiring supervision/potential complications: prior ICH; HTN- uncontrolled- doesn't want ot take meds; smoker; EtOh use disorder;   Due to bladder management, bowel management, safety, skin/wound care, disease management, medication administration, pain management, and patient education, does the patient require 24 hr/day rehab nursing? Yes Does the patient require coordinated care of a physician, rehab nurse, PT, OT, and SLP to address physical and functional deficits in the context of the above medical diagnosis(es)? Yes Addressing deficits in the following areas: balance, endurance, locomotion, strength, transferring, bowel/bladder control, bathing, dressing, feeding, grooming, and toileting Can the patient actively participate in an intensive therapy program of at least 3 hrs of therapy 5 days a week? Yes The potential for patient to make measurable gains while on inpatient rehab is good Anticipated functional outcomes upon discharge from inpatient rehab: supervision and min assist PT, supervision and min assist OT, n/a SLP Estimated rehab length of stay to reach the above functional goals is: 14-18 days Anticipated discharge destination: Home 10. Overall Rehab/Functional Prognosis: good     MD Signature:

## 2022-07-31 NOTE — Progress Notes (Addendum)
Inpatient Rehabilitation Admission Medication Review by a Pharmacist  A complete drug regimen review was completed for this patient to identify any potential clinically significant medication issues.  High Risk Drug Classes Is patient taking? Indication by Medication  Antipsychotic No   Anticoagulant Yes Lovenox - VTE ppx  Antibiotic No   Opioid No   Antiplatelet Yes Aspirin, Clopidogrel x 3 months - CVA then aspirin  Hypoglycemics/insulin No   Vasoactive Medication No   Chemotherapy No   Other Yes Atorvastatin - HLD Folic acid - supplement Thiamine - supplement      Type of Medication Issue Identified Description of Issue Recommendation(s)  Drug Interaction(s) (clinically significant)     Duplicate Therapy     Allergy     No Medication Administration End Date     Incorrect Dose     Additional Drug Therapy Needed     Significant med changes from prior encounter (inform family/care partners about these prior to discharge).    Other       Clinically significant medication issues were identified that warrant physician communication and completion of prescribed/recommended actions by midnight of the next day:  No  Pharmacist comments: None  Time spent performing this drug regimen review (minutes):  20 minutes  Okey Regal, PharmD

## 2022-07-31 NOTE — H&P (Signed)
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  Per chart review patient lives with spouse and children.  1 level home 4 steps to entry.  Patient reports being ambulatory within the home with assistance provided by son/husband.  She does have a sedentary lifestyle.  Presented 07/27/2022 with acute occipital headache as well as right facial droop and worsening right-sided weakness with slurred speech and altered mental status.  Noted blood pressure 201/117.  Cranial CT scan showed no evidence of acute intracranial abnormality.  MRI identified acute infarct in the medial, inferior right occipital lobe.  No evidence of acute hemorrhage.  Patient did not receive TNK.  CT angiogram head and neck multifocal severe proximal P2 PCA stenosis with possible more distal left P2 PCA occlusion.  Critical stenosis versus occlusion of the distal right ACA.  Severe right P2 PCA new since 2021.  Admission chemistries unremarkable except glucose 112, alcohol and urine drug screen negative.  Echo with ejection fraction of 65 to 70% grade 1 diastolic dysfunction no regional wall motion abnormalities.  Neurology follow-up maintained on low-dose aspirin as well as Plavix for CVA prophylaxis x 90 days then aspirin alone.  Lovenox added for DVT prophylaxis.  Tolerating a regular consistency diet.  Therapy evaluations completed due to patient decreased functional mobility right side weakness was admitted for a comprehensive rehab program     Pt reports no pain- LBM  yesterday Voiding with purewick- says doesn't "know how will go without it because arms weak".    No weakness on L side, just weaker on R side since stroke.    Review of Systems  Constitutional:  Negative for chills and fever.  HENT:  Negative for hearing loss.   Eyes:  Negative for blurred vision and double vision.  Respiratory:  Negative for cough, shortness of breath and wheezing.   Cardiovascular:  Negative for chest pain, palpitations and leg swelling.  Gastrointestinal:  Positive for constipation. Negative for heartburn, nausea and vomiting.  Genitourinary:  Negative for dysuria, flank pain and hematuria.  Musculoskeletal:  Positive for joint pain and myalgias.  Skin:  Negative for rash.  Neurological:  Positive for weakness and headaches.  All other systems reviewed and are negative.       Past Medical History:  Diagnosis Date   Alcohol dependence (HCC)     H/O brain tumor     Smoker               Past Surgical History:  Procedure Laterality Date   CRANIOTOMY FOR TUMOR                 Family History  Problem Relation Age of Onset   Diabetes Mother          Social History:  reports that she has been smoking cigarettes. She has never used smokeless tobacco. She reports current alcohol use of about 8.0 standard drinks of alcohol per week. She reports that she does not currently use drugs. Allergies:  Allergies  No Known Allergies   No medications prior to admission.  Home: Home Living Family/patient expects to be discharged to:: Private residence Living Arrangements: Spouse/significant other, Children Available Help at Discharge: Family, Available 24 hours/day Type of Home: House Home Access: Stairs to enter [employer] of Steps: 4-5 Entrance Stairs-Rails: Right, Left Home Layout: One level Bathroom Shower/Tub: Health visitor: Standard (has bedside commode as well) Bathroom Accessibility: Yes Home Equipment:  Cane - single point, Agricultural consultant (2 wheels), BSC/3in1, Wheelchair - manual, Shower seat - built in  Lives With: Spouse, Son   Functional History: Prior Function Mobility Comments: Pt reports being ambulatory within the house with assist provided by son/husband. After session, husband reports to Thereasa Parkin that pt has been sedentary on couch since '21 stroke, unable to ambulate and requires assist for tranfers ADLs Comments: requiring assist   Functional Status:  Mobility: Bed Mobility Overal bed mobility: Needs Assistance Bed Mobility: Supine to Sit Supine to sit: Mod assist Sit to supine: Min guard General bed mobility comments: Assist needed only to scoot R LE and hip to edge of bed Transfers Overall transfer level: Needs assistance Equipment used:  Antony Salmon) Transfers: Sit to/from Stand, Bed to chair/wheelchair/BSC Sit to Stand: Mod assist (With use of the Stedy) Bed to/from chair/wheelchair/BSC transfer type:: Step pivot Step pivot transfers: Max assist General transfer comment: Pt able to stand in the Steady with mod assist overall increasing to max with fatigue and motor impersistence.  She needed max assist for stand pivot with therapist having to provide max assist for upright trunk and hip extension and for advancing the LLE. Ambulation/Gait General Gait Details: unable   ADL: ADL Overall ADL's : Needs assistance/impaired Eating/Feeding: Supervision/ safety, Sitting Eating/Feeding Details (indicate cue type and reason): setup in recliner using the LUE primarily Grooming: Wash/dry hands, Wash/dry face, Sitting, Min guard Grooming Details (indicate cue type and reason): simulated Upper Body Bathing: Minimal assistance, Sitting Upper Body Bathing Details (indicate cue type and reason): simulated Lower Body Bathing: Total assistance, Sit to/from stand Lower Body Bathing Details (indicate cue type and reason): standing in the Stedy Upper Body Dressing : Moderate assistance,  Sitting Upper Body Dressing Details (indicate cue type and reason): hospital gown Lower Body Dressing: Maximal assistance, +2 for physical assistance, Sit to/from stand Toilet Transfer: Maximal assistance, Stand-pivot Toilet Transfer Details (indicate cue type and reason): simulated stand pivot to the bedside recliner Toileting- Clothing Manipulation and Hygiene: Total assistance, Sit to/from stand Toileting - Clothing Manipulation Details (indicate cue type and reason): standing in the [place] Functional mobility during ADLs: Maximal assistance (step pivot transfer) General ADL Comments: Pt in bed with wet gown and sheets secondary to Purewick not being positioned against her.  Mod assist for transfer to sitting with therapist assist.  She was able to complete sit to stand in the [place] with mod assist and BUE support.  In standing, she needed mod assist to maintain secondary to motor impersistence and pt slowly sitting back down.  Total assist for completing of toilet hygiene in standing as if pt attempted she would sit down faster.  Pt with no report of diplopia when asked today.  Transferred step pivot to the wheelchair to eat lunch with max facilitation to maintain hip extension and for advancing the RLE.   Cognition: Cognition Overall Cognitive Status: Impaired/Different from baseline Orientation Level: Oriented to person, Oriented to time, Disoriented to place, Oriented to situation Cognition Arousal/Alertness: Awake/alert Behavior During Therapy: WFL for tasks assessed/performed Overall Cognitive Status: Impaired/Different from baseline Area of Impairment: Problem solving, Memory Memory: Decreased  short-term memory Safety/Judgement: Decreased awareness of safety, Decreased awareness of deficits Awareness: Intellectual Problem Solving: Slow processing, Decreased initiation, Requires verbal cues, Requires tactile cues, Difficulty sequencing General Comments: slow response times, difficulty  word finding.   Physical Exam: Blood pressure 128/75, pulse 65, temperature 97.6 F (36.4 C), temperature source Oral, resp. rate 20, height 5\' 2"  (1.575 m), weight 70.6 kg, SpO2 100%. Physical Exam Vitals and nursing note reviewed.  Constitutional:      Appearance: She is obese.     Comments: Pt sitting up in bed; awake, alert, answers questions, but doesn't ask them, trying to eat lunch with L hand, NAD  HENT:     Head: Normocephalic and atraumatic.     Comments: Endulous  R facial droop Tongue midline    Right Ear: External ear normal.     Left Ear: External ear normal.     Nose: Nose normal. No congestion.     Mouth/Throat:     Mouth: Mucous membranes are dry.     Pharynx: Oropharynx is clear. No oropharyngeal exudate.  Eyes:     General:        Right eye: No discharge.        Left eye: No discharge.     Comments: L gaze preference- dysconjugate gaze R side of R lens is taped- pt looking above eyeglasses  Cardiovascular:     Rate and Rhythm: Normal rate and regular rhythm.     Heart sounds: Normal heart sounds. No murmur heard.    No gallop.  Pulmonary:     Effort: Pulmonary effort is normal. No respiratory distress.     Breath sounds: Normal breath sounds. No wheezing, rhonchi or rales.  Abdominal:     General: Bowel sounds are normal. There is no distension.     Palpations: Abdomen is soft.     Tenderness: There is no abdominal tenderness.  Musculoskeletal:     Cervical back: Neck supple. No tenderness.     Comments: RUE- 2/5 except R triceps 2-/5 and FA 1/5 LUE 5/5 in same muslces tested RLE- HF 3-/5; couldn't test KE/KF due to pt saying "doesn't work"- DF/PF 4-/5  Skin:    General: Skin is warm and dry.  Neurological:     Mental Status: She is alert.     Comments: Patient is alert.  Makes eye contact with examiner.  Speech is mildly dysarthric but intelligible.  Follows commands.  Provides name and age.  Fair insight and awareness. RUE intact to light touch,  but RLE decreased to light touch Face intact to LT Posturing with RUE at rest  Psychiatric:     Comments: Flat- quiet        Lab Results Last 48 Hours  No results found for this or any previous visit (from the past 48 hour(s)).   Imaging Results (Last 48 hours)  No results found.         Blood pressure 128/75, pulse 65, temperature 97.6 F (36.4 C), temperature source Oral, resp. rate 20, height 5\' 2"  (1.575 m), weight 70.6 kg, SpO2 100%.   Medical Problem List and Plan: 1. 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  Antithrombotics: -DVT/anticoagulation:  Pharmaceutical: Lovenox             -antiplatelet therapy: Aspirin 81 mg daily and Plavix 75 mg daily x 90 days then aspirin alone 3. Pain Management: Tylenol as needed 4. Mood/Behavior/Sleep: Provide emotional support             -antipsychotic agents: N/A 5. Neuropsych/cognition: This patient is capable of making decisions on her own behalf. 6. Skin/Wound Care: Routine skin checks 7. Fluids/Electrolytes/Nutrition: Routine in and outs with follow-up chemistries 8.  Permissive hypertension.  Monitor with increased mobility 9.  Hyperlipidemia.  Lipitor 10.  History of alcohol tobacco use.  Counseling 11.  Overweight  BMI 28.47.  Dietary follow-up 12.  Medical noncompliance.  Counseling- doesn't like to take meds, esp HTN meds.          Mcarthur Rossetti Angiulli, PA-C 07/31/2022     I have personally performed a face to face diagnostic evaluation of this patient and formulated the key components of the plan.  Additionally, I have personally reviewed laboratory data, imaging studies, as well as  relevant notes and concur with the physician assistant's documentation above.   The patient's status has not changed from the original H&P.  Any changes in documentation from the acute care chart have been noted above.

## 2022-07-31 NOTE — Plan of Care (Signed)
  Problem: Clinical Measurements: Goal: Will remain free from infection Outcome: Progressing Goal: Respiratory complications will improve Outcome: Progressing Goal: Cardiovascular complication will be avoided Outcome: Progressing   Problem: Pain Managment: Goal: General experience of comfort will improve Outcome: Progressing   Problem: Safety: Goal: Ability to remain free from injury will improve Outcome: Progressing   Problem: Education: Goal: Knowledge of General Education information will improve Description: Including pain rating scale, medication(s)/side effects and non-pharmacologic comfort measures Outcome: Not Progressing   Problem: Health Behavior/Discharge Planning: Goal: Ability to manage health-related needs will improve Outcome: Not Progressing   Problem: Activity: Goal: Risk for activity intolerance will decrease Outcome: Not Progressing   Problem: Ischemic Stroke/TIA Tissue Perfusion: Goal: Complications of ischemic stroke/TIA will be minimized Outcome: Not Progressing

## 2022-07-31 NOTE — Discharge Instructions (Addendum)
STROKE/TIA DISCHARGE INSTRUCTIONS SMOKING Cigarette smoking nearly doubles your risk of having a stroke & is the single most alterable risk factor  If you smoke or have smoked in the last 12 months, you are advised to quit smoking for your health. Most of the excess cardiovascular risk related to smoking disappears within a year of stopping. Ask you doctor about anti-smoking medications [place] Quit Line: 1-800-QUIT NOW Free Smoking Cessation Classes [phone]  CHOLESTEROL Know your levels; limit fat & cholesterol in your diet  Lipid Panel     Component Value Date/Time   CHOL 184 07/28/2022 0523   TRIG 130 07/28/2022 0523   HDL 35 (L) 07/28/2022 0523   CHOLHDL 5.3 07/28/2022 0523   VLDL 26 07/28/2022 0523   LDLCALC 123 (H) 07/28/2022 0523     Many patients benefit from treatment even if their cholesterol is at goal. Goal: Total Cholesterol (CHOL) less than 160 Goal:  Triglycerides (TRIG) less than 150 Goal:  HDL greater than 40 Goal:  LDL (LDLCALC) less than 100   BLOOD PRESSURE American Stroke Association blood pressure target is less that 120/80 mm/Hg  Your discharge blood pressure is:  BP: 118/84 Monitor your blood pressure Limit your salt and alcohol intake Many individuals will require more than one medication for high blood pressure  DIABETES (A1c is a blood sugar average for last 3 months) Goal HGBA1c is under 7% (HBGA1c is blood sugar average for last 3 months)  Diabetes: No known diagnosis of diabetes    Lab Results  Component Value Date   HGBA1C 5.6 07/28/2022    Your HGBA1c can be lowered with medications, healthy diet, and exercise. Check your blood sugar as directed by your physician Call your physician if you experience unexplained or low blood sugars.  PHYSICAL ACTIVITY/REHABILITATION Goal is 30 minutes at least 4 days per week  Activity: Increase activity slowly, Therapies: Physical Therapy: Home Health Return to work:  Activity decreases your risk of heart  attack and stroke and makes your heart stronger.  It helps control your weight and blood pressure; helps you relax and can improve your mood. Participate in a regular exercise program. Talk with your doctor about the best form of exercise for you (dancing, walking, swimming, cycling).  DIET/WEIGHT Goal is to maintain a healthy weight  Your discharge diet is:  Diet Order             Diet regular Fluid consistency: Thin  Diet effective now                   liquids Your height is:  Height: 5\' 2"  (157.5 cm) Your current weight is: Weight: 69.6 kg Your Body Mass Index (BMI) is:  BMI (Calculated): 28.06 Following the type of diet specifically designed for you will help prevent another stroke. Your goal weight range is:   Your goal Body Mass Index (BMI) is 19-24. Healthy food habits can help reduce 3 risk factors for stroke:  High cholesterol, hypertension, and excess weight.  RESOURCES Stroke/Support Group:  Call [phone]   STROKE EDUCATION PROVIDED/REVIEWED AND GIVEN TO PATIENT Stroke warning signs and symptoms How to activate emergency medical system (call 911). Medications prescribed at discharge. Need for follow-up after discharge. Personal risk factors for stroke. Pneumonia vaccine given: No Flu vaccine given: No My questions have been answered, the writing is legible, and I understand these instructions.  I will adhere to these goals & educational materials that have been provided to me after my discharge  from the hospital.   Inpatient Rehab Discharge Instructions  Melissa Colon Discharge date and time: No discharge date for patient encounter.   Activities/Precautions/ Functional Status: Activity: As tolerated Diet: Regular Wound Care: Routine skin checks Functional status:  ___ No restrictions     ___ Walk up steps independently ___ 24/7 supervision/assistance   ___ Walk up steps with assistance ___ Intermittent supervision/assistance  ___ Bathe/dress  independently ___ Walk with walker     _x__ Bathe/dress with assistance ___ Walk Independently    ___ Shower independently ___ Walk with assistance    ___ Shower with assistance ___ No alcohol     ___ Return to work/school ________   COMMUNITY REFERRALS UPON DISCHARGE:    Home Health:   PT     OT     ST                   Agency:[facility] Phone: [phone]   Medical Equipment/Items Ordered:Hospital Bed, Rolling Walker and Drop Arm Commode                                                 Agency/Supplier: [employer] [phone]   Special Instructions: No driving smoking or alcohol   My questions have been answered and I understand these instructions. I will adhere to these goals and the provided educational materials after my discharge from the hospital.  Patient/Caregiver Signature _______________________________ Date __________  Clinician Signature _______________________________________ Date __________  Please bring this form and your medication list with you to all your follow-up doctor's appointments.

## 2022-08-01 DIAGNOSIS — I63511 Cerebral infarction due to unspecified occlusion or stenosis of right middle cerebral artery: Secondary | ICD-10-CM | POA: Diagnosis not present

## 2022-08-01 LAB — COMPREHENSIVE METABOLIC PANEL
ALT: 23 U/L (ref 0–44)
AST: 20 U/L (ref 15–41)
Albumin: 2.9 g/dL — ABNORMAL LOW (ref 3.5–5.0)
Alkaline Phosphatase: 105 U/L (ref 38–126)
Anion gap: 7 (ref 5–15)
BUN: 11 mg/dL (ref 6–20)
CO2: 21 mmol/L — ABNORMAL LOW (ref 22–32)
Calcium: 8.2 mg/dL — ABNORMAL LOW (ref 8.9–10.3)
Chloride: 110 mmol/L (ref 98–111)
Creatinine, Ser: 0.59 mg/dL (ref 0.44–1.00)
GFR, Estimated: >60 mL/min (ref >60)
Glucose, Bld: 104 mg/dL — ABNORMAL HIGH (ref 70–99)
Potassium: 3.4 mmol/L — ABNORMAL LOW (ref 3.5–5.1)
Sodium: 138 mmol/L (ref 135–145)
Total Bilirubin: 0.5 mg/dL (ref 0.3–1.2)
Total Protein: 6.3 g/dL — ABNORMAL LOW (ref 6.5–8.1)

## 2022-08-01 LAB — CBC WITH DIFFERENTIAL/PLATELET
Abs Immature Granulocytes: 0.01 10*3/uL (ref 0.00–0.07)
Basophils Absolute: 0.1 10*3/uL (ref 0.0–0.1)
Basophils Relative: 1 %
Eosinophils Absolute: 0.1 10*3/uL (ref 0.0–0.5)
Eosinophils Relative: 2 %
HCT: 37 % (ref 36.0–46.0)
Hemoglobin: 11.9 g/dL — ABNORMAL LOW (ref 12.0–15.0)
Immature Granulocytes: 0 %
Lymphocytes Relative: 33 %
Lymphs Abs: 1.9 10*3/uL (ref 0.7–4.0)
MCH: 27.2 pg (ref 26.0–34.0)
MCHC: 32.2 g/dL (ref 30.0–36.0)
MCV: 84.5 fL (ref 80.0–100.0)
Monocytes Absolute: 0.7 10*3/uL (ref 0.1–1.0)
Monocytes Relative: 12 %
Neutro Abs: 3 10*3/uL (ref 1.7–7.7)
Neutrophils Relative %: 52 %
Platelets: 231 10*3/uL (ref 150–400)
RBC: 4.38 MIL/uL (ref 3.87–5.11)
RDW: 12.7 % (ref 11.5–15.5)
WBC: 5.8 10*3/uL (ref 4.0–10.5)
nRBC: 0 % (ref 0.0–0.2)

## 2022-08-01 MED ORDER — NICOTINE 14 MG/24HR TD PT24: 14.0000 mg | MEDICATED_PATCH | Freq: Every day | TRANSDERMAL | Status: DC

## 2022-08-01 MED ORDER — NYSTATIN 100000 UNIT/GM EX POWD
Freq: Two times a day (BID) | CUTANEOUS | Status: DC
  Filled 2022-08-01: qty 15

## 2022-08-01 NOTE — Patient Care Conference (Signed)
Inpatient RehabilitationTeam Conference and Plan of Care Update Date: 08/01/2022   Time: 10:53 AM    Patient Name: Melissa Colon      Medical Record Number: [id number]  Date of Birth: 11-03-1960 Sex: Female         Room/Bed: 4W24C/4W24C-01 Payor Info: Payor: [place] MEDICAID PREPAID HEALTH PLAN / Plan: [place] MEDICAID [place] COMPLETE HEALTH / Product Type: *No Product type* /    Admit Date/Time:  07/31/2022  5:22 PM  Primary Diagnosis:  Acute ischemic right MCA stroke [facility])  Hospital Problems: Principal Problem:   Acute ischemic right MCA stroke [facility])    Expected Discharge Date: Expected Discharge Date:  (evals pending)  Team Members Present: Physician leading conference: Dr. Claudette Laws Social Worker Present: Lavera Guise, BSW Nurse Present: Chana Bode, RN PT Present: Casimiro Needle, PT OT Present: Bonnell Public, OT SLP Present: Everardo Pacific, SLP PPS Coordinator present : Fae Pippin, SLP     Current Status/Progress Goal Weekly Team Focus  Bowel/Bladder   Continent of bowel, periods of incontinence of bladder. LBM 07/30/22   Regain continence of bladder, remain continent of bowel.   Offer toileting q 2 hours while awake and PRN.    Swallow/Nutrition/ Hydration               ADL's                Mobility   eval pending           Communication                Safety/Cognition/ Behavioral Observations               Pain   Patient rates back pain 3/10. Tylenol PO administered.   Patient rates pain < or equal to 2/10.   Assess pain q shift and PRN.    Skin   SKin is intact, abdominal bruising.   Skin to remain free from breakdown.  Assess skin q shift.      Discharge Planning:  New admission, assesment pending   Team Discussion: Patient with childhood brain tumor and previous left thalamic CVA and right hemiparesis; now with new Right MCA CVA; dysarthria.  Patient on target to meet rehab goals: Evals pending; needs mod assist  for sit - stand and using a stedy for transfers.  *See Care Plan and progress notes for long and short-term goals.   Revisions to Treatment Plan:  N/a   Teaching Needs: Safety, medications/compliance, dietary modifications, smoking cessation/ETOH misuse, transfers, toileting, etc.  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 Possible Resolutions to [employer] Focus: initiate rehab program   Continued Need for Acute Rehabilitation Level of Care: The patient requires daily medical management by a physician with specialized training in physical medicine and rehabilitation for the following reasons: Direction of a multidisciplinary physical rehabilitation program to maximize functional independence : Yes Medical management of patient stability for increased activity during participation in an intensive rehabilitation regime.: Yes Analysis of laboratory values and/or radiology reports with any subsequent need for medication adjustment and/or medical intervention. : Yes   I attest that I was present, lead the team conference, and concur with the assessment and plan of the team.  Chana Bode B 08/01/2022, 2:16 PM

## 2022-08-01 NOTE — Evaluation (Signed)
Physical Therapy Assessment and Plan  Patient Details  Name: Melissa Colon MRN: [id number] Date of Birth: 1960/12/23  PT Diagnosis: Abnormal posture, Coordination disorder, Hemiparesis dominant, Impaired sensation, Low back pain, and Muscle weakness Rehab Potential: Good ELOS: 2-3 weeks   Today's Date: 08/01/2022 PT Individual Time: 0916-1030 and 1345-1500 PT Individual Time Calculation (min): 74 min  and 75 min  Hospital Problem: Principal Problem:   Acute ischemic right MCA stroke [facility])   Past Medical History:  Past Medical History:  Diagnosis Date   Alcohol dependence (HCC)    H/O brain tumor    Smoker    Past Surgical History:  Past Surgical History:  Procedure Laterality Date   CRANIOTOMY FOR TUMOR      Assessment & Plan Clinical Impression: Melissa Colon is a 62 year old right-handed female with history significant for posterior fossa brain tumor with resection age 22 with radiation therapy, 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 Per chart review patient lives with spouse and children. 1 level home 4 steps to entry. Patient reports being ambulatory within the home with assistance provided by son/husband. She does have a sedentary lifestyle. Presented 07/27/2022 with acute occipital headache as well as right facial droop and worsening right-sided weakness with slurred speech and altered mental status. Noted blood pressure 201/117. Cranial CT scan showed no evidence of acute intracranial abnormality. MRI identified acute infarct in the medial, inferior right occipital lobe. No evidence of acute hemorrhage. Patient did not receive TNK. CT angiogram head and neck multifocal severe proximal P2 PCA stenosis with possible more distal left P2 PCA occlusion. Critical stenosis versus occlusion of the  distal right ACA. Severe right P2 PCA new since 2021. Admission chemistries unremarkable except glucose 112, alcohol and urine drug screen negative. Echo with ejection fraction of 65 to 70% grade 1 diastolic dysfunction no regional wall motion abnormalities. Neurology follow-up maintained on low-dose aspirin as well as Plavix for CVA prophylaxis x 90 days then aspirin alone. Lovenox added for DVT prophylaxis. Tolerating a regular consistency diet. Therapy evaluations completed due to patient decreased functional mobility right side weakness was admitted for a comprehensive rehab program   Patient currently requires max with mobility secondary to muscle weakness, decreased cardiorespiratoy endurance, and abnormal tone, decreased coordination, and decreased motor planning.  Prior to hospitalization, patient was min with mobility and lived with Spouse, Son, Other (Comment) (BIL and niece) in a House home.  Home access is 7Stairs to enter.  Patient will benefit from skilled PT intervention to maximize safe functional mobility and minimize fall risk for planned discharge home with 24 hour assist.  Anticipate patient will benefit from follow up [facility] at discharge.  PT - End of Session Activity Tolerance: Tolerates 10 - 20 min activity with multiple rests Endurance Deficit: Yes PT Assessment Rehab Potential (ACUTE/IP ONLY): Good PT Barriers to Discharge: Inaccessible home environment;Home environment access/layout PT Patient demonstrates impairments in the following area(s): Balance;Safety;Sensory;Endurance;Motor;Pain PT Transfers Functional Problem(s): Bed Mobility;Bed to Chair;Car;Furniture PT Locomotion Functional Problem(s): Ambulation;Stairs PT Plan PT Intensity: Minimum of 1-2 x/day ,45 to 90 minutes PT Frequency: 5 out of 7 days PT Duration Estimated Length of Stay: 2-3 weeks PT Treatment/Interventions: Ambulation/gait training;Community reintegration;Neuromuscular re-education;Stair training;UE/LE  Strength taining/ROM;Discharge planning;Therapeutic Activities;UE/LE Coordination activities;Functional mobility training;Patient/family education PT Transfers Anticipated Outcome(s): min A PT Locomotion Anticipated Outcome(s): Min A w/ LRAD PT Recommendation Recommendations for  Other Services: Speech consult Follow Up Recommendations: Home health PT Patient destination: Home Equipment Recommended: To be determined   PT Evaluation Precautions/Restrictions Precautions Precautions: Fall Precaution Comments: right UE and LE hemiparesis. Restrictions Weight Bearing Restrictions: No General Chart Reviewed: Yes Family/Caregiver Present: No Vital Signs  Pain  4/10 in PM Pain Assessment Pain Scale: 0-10 Pain Score: 4  Pain Type: Acute pain Pain Location: Back Pain Descriptors / Indicators: Aching Pain Interference Pain Interference Pain Effect on Sleep: 1. Rarely or not at all Pain Interference with Therapy Activities: 1. Rarely or not at all Pain Interference with Day-to-Day Activities: 1. Rarely or not at all Home Living/Prior Functioning Home Living Living Arrangements: Spouse/significant other;Children;Other relatives Available Help at Discharge: Family;Available 24 hours/day Type of Home: House Home Access: Stairs to enter [employer] of Steps: 7 Entrance Stairs-Rails: Right Home Layout: One level Bathroom Shower/Tub: Health visitor: Standard Bathroom Accessibility: Yes  Lives With: Spouse;Son;Other (Comment) (BIL and niece) Prior Function Level of Independence: Independent with transfers;Requires assistive device for independence Bath: Minimal Toileting: Moderate Dressing: Moderate Grooming: Minimal  Able to Take Stairs?: Yes Driving: No Vocation: Retired Vision/Perception  Vision - History Ability to See in Adequate Light: 1 Impaired Vision - Assessment Eye Alignment: Within Functional Limits Ocular Range of Motion: Within  Functional Limits Alignment/Gaze Preference: Within Defined Limits Tracking/Visual Pursuits: Decreased smoothness of horizontal tracking;Decreased smoothness of vertical tracking Saccades: Decreased speed of saccadic movement;Additional eye shifts occurred during testing Convergence: Impaired - to be further tested in functional context Diplopia Assessment: Objects split side to side;Other (comment) Additional Comments: Pt has hx of intermittent diplopia since previous CVA. Pt presenting with nystagmus during vertical and horizontal tracking during evalution. Diplopia present during near and far. Perception Perception: Impaired Inattention/Neglect: Does not attend to right side of body (mild impairment) Praxis Praxis: Impaired Praxis Impairment Details: Motor planning  Cognition Overall Cognitive Status: Impaired/Different from baseline Arousal/Alertness: Awake/alert Attention: Sustained;Selective Sustained Attention: Appears intact Selective Attention: Impaired Selective Attention Impairment: Functional basic Memory: Impaired Memory Impairment: Decreased short term memory;Decreased recall of new information;Retrieval deficit Decreased Short Term Memory: Functional basic;Verbal basic Awareness: Impaired Awareness Impairment: Intellectual impairment Problem Solving: Impaired Problem Solving Impairment: Functional basic;Verbal basic Behaviors:  (mildly emotionally lability) Safety/Judgment: Impaired Sensation Sensation Light Touch: Impaired by gross assessment (absent feet, diminished knee and distal.) Hot/Cold: Not tested Proprioception: Impaired by gross assessment Stereognosis: Impaired by gross assessment Coordination Gross Motor Movements are Fluid and Coordinated: No Fine Motor Movements are Fluid and Coordinated: No Finger Nose Finger Test: slowed motor movements with motor planning challenges on R side Heel Shin Test: unable Motor  Motor Motor: Hemiplegia    Trunk/Postural Assessment  Cervical Assessment Cervical Assessment: Exceptions to [facility] (forward head) Thoracic Assessment Thoracic Assessment: Exceptions to [facility] (rounded shoulders) Lumbar Assessment Lumbar Assessment: Exceptions to [facility] (post pelvic tilt.) Postural Control Postural Control: Deficits on evaluation Righting Reactions: delayed.  Balance Balance Balance Assessed: Yes Dynamic Sitting Balance Sitting balance - Comments: no Lob sitting edge of bed Static Standing Balance Static Standing - Balance Support: Bilateral upper extremity supported Static Standing - Level of Assistance: 5: Stand by assistance;2: Max assist Extremity Assessment      RLE Assessment RLE Assessment: Exceptions to [facility] RLE Strength Right Hip Flexion: 3-/5 Right Knee Flexion: 3+/5 Right Knee Extension: 4-/5 Right Ankle Dorsiflexion: 3+/5 LLE Assessment LLE Assessment: Within Functional Limits General Strength Comments: grossly 4-5/5  Care Tool Care Tool Bed Mobility Roll left and right activity   Roll left  and right assist level: Moderate Assistance - Patient 50 - 74%    Sit to lying activity   Sit to lying assist level: Moderate Assistance - Patient 50 - 74%    Lying to sitting on side of bed activity   Lying to sitting on side of bed assist level: the ability to move from lying on the back to sitting on the side of the bed with no back support.: Moderate Assistance - Patient 50 - 74%     Care Tool Transfers Sit to stand transfer   Sit to stand assist level: Maximal Assistance - Patient 25 - 49%    Chair/bed transfer   Chair/bed transfer assist level: Maximal Assistance - Patient 25 - 49%     Toilet transfer   Assist Level: Total Assistance - Patient < 25%    Licensed conveyancer transfer activity did not occur: Safety/medical concerns        Care Tool Locomotion Ambulation Ambulation activity did not occur: Safety/medical concerns        Walk 10 feet activity Walk 10 feet  activity did not occur: Safety/medical concerns       Walk 50 feet with 2 turns activity Walk 50 feet with 2 turns activity did not occur: Safety/medical concerns      Walk 150 feet activity Walk 150 feet activity did not occur: Safety/medical concerns      Walk 10 feet on uneven surfaces activity Walk 10 feet on uneven surfaces activity did not occur: Safety/medical concerns      Stairs Stair activity did not occur: Safety/medical concerns        Walk up/down 1 step activity Walk up/down 1 step or curb (drop down) activity did not occur: Safety/medical concerns      Walk up/down 4 steps activity Walk up/down 4 steps activity did not occur: Safety/medical concerns      Walk up/down 12 steps activity Walk up/down 12 steps activity did not occur: Safety/medical concerns      Pick up small objects from floor Pick up small object from the floor (from standing position) activity did not occur: Safety/medical concerns      Wheelchair Is the patient using a wheelchair?: Yes Type of Wheelchair: Manual   Wheelchair assist level: Dependent - Patient 0%    Wheel 50 feet with 2 turns activity   Assist Level: Dependent - Patient 0%  Wheel 150 feet activity   Assist Level: Dependent - Patient 0%    Refer to Care Plan for Long Term Goals  SHORT TERM GOAL WEEK 1 PT Short Term Goal 1 (Week 1): Pt will roll side to side w/ mod A. PT Short Term Goal 2 (Week 1): Pt will transfer sup to sit w/ mod A PT Short Term Goal 3 (Week 1): Pt will transfer sit to stand/SPT w/ mod A. PT Short Term Goal 4 (Week 1): PT will assess gait.  Recommendations for other services: Neuropsych  Skilled Therapeutic Intervention Evaluation completed (see details above and below) with education on PT POC and goals and individual treatment initiated with focus on endurance, gait, transfer, NMR R.  First session:  Pt presents supine in bed and reluctantly agreeable to therapy.  PT retrieved w/c w/ cushion.  Pt  transfers sup to sit w/ mod A.  Pt transfers sit to stand w/ Max A and cues for hand placement.  Pt transfers bed > w/c w/ max A and blocking R knee.  Pt unable to negotiate w/c 2/2 R  UE paresis and pt does not negotiate at home 2/2 "thick carpeting."  Pt performed standing w/ Stedy and mod to max A, but then min A for sit to stand from [employer].  Pt returned to room and remained sitting in w/c w/ chair alarm on and all needs in reach, 1/2 tray on w/c.  Second session:  Pt presents sitting in w/c and agreeable to therapy.  Pt wheeled to dayroom for energy conservation.  Pt stood in standing frame for increased NMR to RLE.  Pt stood for bouts of 3-4" reaching to R to place clothing pins to R, R UE placed on tray. Pt required extended seated rest breaks 2/2 fatigue, c/o pain to LB.   Pt performed sitting reaching w/ R UE for Squigz on table w/ manual A for raising hand to grasp.  Pt returned to room and performed SPT w/ blocking of R knee and max A.  Pt transfers sit to supine w/ mod A.  Bed alarm on and all needs in reach.        Mobility Bed Mobility Bed Mobility: Rolling Right;Rolling Left;Supine to Sit;Sit to Supine Rolling Right: Minimal Assistance - Patient > 75% Rolling Left: Maximal Assistance - Patient 25-49% Supine to Sit: Moderate Assistance - Patient 50-74% Sit to Supine: Moderate Assistance - Patient 50-74% Transfers Transfers: Sit to Stand;Stand to Sit;Stand Pivot Transfers Sit to Stand: Maximal Assistance - Patient 25-49% Stand to Sit: Maximal Assistance - Patient 25-49% Stand Pivot Transfers: Maximal Assistance - Patient 25 - 49% Stand Pivot Transfer Details: Verbal cues for technique;Verbal cues for sequencing Transfer (Assistive device): 1 person hand held assist Locomotion  Gait Ambulation: No Gait Gait: No Stairs / Additional Locomotion Stairs: No Wheelchair Mobility Wheelchair Mobility: No   Discharge Criteria: Patient will be discharged from PT if patient  refuses treatment 3 consecutive times without medical reason, if treatment goals not met, if there is a change in medical status, if patient makes no progress towards goals or if patient is discharged from hospital.  The above assessment, treatment plan, treatment alternatives and goals were discussed and mutually agreed upon: by patient  Lucio Edward 08/01/2022, 4:02 PM

## 2022-08-01 NOTE — Progress Notes (Signed)
Patient refusing nicotine patch;states she no longer smokes and does not need it.

## 2022-08-01 NOTE — Evaluation (Signed)
Occupational Therapy Assessment and Plan  Patient Details  Name: Melissa Colon MRN: [id number] Date of Birth: [birth year]  OT Diagnosis: abnormal posture, cognitive deficits, disturbance of vision, hemiplegia affecting dominant side, muscle weakness (generalized), and pain in joint Rehab Potential: Rehab Potential (ACUTE ONLY): Good ELOS: 2-3 weeks   Today's Date: 08/01/2022 OT Individual Time: 1100-1205 OT Individual Time Calculation (min): 65 min     Hospital Problem: Principal Problem:   Acute ischemic right MCA stroke (HCC)   Past Medical History:  Past Medical History:  Diagnosis Date   Alcohol dependence (HCC)    H/O brain tumor    Smoker    Past Surgical History:  Past Surgical History:  Procedure Laterality Date   CRANIOTOMY FOR TUMOR      Assessment & Plan Clinical Impression: HPI: Melissa Colon is a 62 year old right-handed female with history significant for 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  Per chart review patient lives with spouse and children.  1 level home 4 steps to entry.  Patient reports being ambulatory within the home with assistance provided by son/husband.  She does have a sedentary lifestyle.  Presented 07/27/2022 with acute occipital headache as well as right facial droop and worsening right-sided weakness with slurred speech and altered mental status.  Noted blood pressure 201/117.  Cranial CT scan showed no evidence of acute intracranial abnormality.  MRI identified acute infarct in the medial, inferior right occipital lobe.  No evidence of acute hemorrhage.  Patient did not receive TNK.  CT angiogram head and neck multifocal severe proximal P2 PCA stenosis with possible more distal left P2  PCA occlusion.  Critical stenosis versus occlusion of the distal right ACA.  Severe right P2 PCA new since 2021.  Admission chemistries unremarkable except glucose 112, alcohol and urine drug screen negative.  Echo with ejection fraction of 65 to 70% grade 1 diastolic dysfunction no regional wall motion abnormalities.  Neurology follow-up maintained on low-dose aspirin as well as Plavix for CVA prophylaxis x 90 days then aspirin alone.  Lovenox added for DVT prophylaxis.  Tolerating a regular consistency diet.  Therapy evaluations completed due to patient decreased functional mobility right side weakness was admitted for a comprehensive rehab program. Patient transferred to CIR on 07/31/2022 .    Patient currently requires max with basic self-care skills secondary to muscle weakness, decreased cardiorespiratoy endurance, impaired timing and sequencing, abnormal tone, unbalanced muscle activation, decreased coordination, and decreased motor planning, decreased visual motor skills and diplopia, decreased midline orientation, decreased attention to left, decreased attention to right, and decreased motor planning, decreased initiation, decreased attention, decreased awareness, decreased problem solving, decreased safety awareness, decreased memory, and delayed processing, central origin, and decreased sitting balance, decreased standing balance, decreased postural control, hemiplegia, and decreased balance strategies.  Prior to hospitalization, patient could complete BADLs with min-mod A.  Patient will benefit from skilled intervention to decrease level of assist with basic self-care skills and increase independence with basic self-care skills prior to discharge home with care partner.  Anticipate patient will require 24 hour supervision and follow up home health.      OT Evaluation Precautions/Restrictions  Precautions Precautions: Fall Precaution Comments: right UE and LE hemiparesis, intermittent  diplopia, monitor HR Restrictions Weight Bearing Restrictions: No General Chart Reviewed: Yes Additional Pertinent History:  previous CVA in 2021 (L thalamic intracranial hemorrhage wtih residual right-sdied weakness), posterior fossa brain tumor with reection age 62 Family/Caregiver Present: No Home Living/Prior Functioning Home Living Available Help at Discharge: Family, Available 24 hours/day Type of Home: House Home Access: Stairs to enter [employer] of Steps: 4-5 Entrance Stairs-Rails: Right, Left Home Layout: One level Bathroom Shower/Tub: Health visitor: Administrator Accessibility: Yes  Lives With: Spouse, Son IADL History Homemaking Responsibilities: No Current License: No Mode of Transportation: Family Occupation: Retired Prior Function Level of Independence: Needs assistance with ADLs, Requires assistive device for independence, Needs assistance with homemaking, Needs assistance with tranfers Bath: Minimal Toileting: Moderate Dressing: Moderate Grooming: Minimal Driving: No Vocation: Retired Administrator, sports Baseline Vision/History:  (intermittent diplopia) Ability to See in Adequate Light: 1 Impaired Patient Visual Report: Diplopia Vision Assessment?: Yes Eye Alignment: Within Functional Limits Ocular Range of Motion: Within Functional Limits Alignment/Gaze Preference: Within Defined Limits Tracking/Visual Pursuits: Decreased smoothness of horizontal tracking;Decreased smoothness of vertical tracking Saccades: Decreased speed of saccadic movement;Additional eye shifts occurred during testing Convergence: Impaired - to be further tested in functional context Visual Fields: No apparent deficits Diplopia Assessment: Objects split side to side;Other (comment) Depth Perception: Undershoots Additional Comments: Pt has hx of intermittent diplopia since previous CVA. Pt presenting with nystagmus during vertical and horizontal tracking during  evalution. Diplopia present during near and far. Perception  Perception: Impaired Inattention/Neglect: Does not attend to right side of body (mild impairment) Praxis Praxis: Impaired Praxis Impairment Details: Motor planning Cognition Cognition Overall Cognitive Status: Impaired/Different from baseline Arousal/Alertness: Awake/alert Orientation Level: Person;Place;Situation Person: Oriented Place: Oriented Situation: Disoriented (unable to report she had a CVA) Memory: Impaired Memory Impairment: Decreased short term memory;Decreased recall of new information;Retrieval deficit Decreased Short Term Memory: Functional basic;Verbal basic Attention: Sustained;Selective Sustained Attention: Appears intact Selective Attention: Impaired Selective Attention Impairment: Functional basic Awareness: Impaired Awareness Impairment: Intellectual impairment Problem Solving: Impaired Problem Solving Impairment: Functional basic;Verbal basic Behaviors:  (mildly emotionally lability) Safety/Judgment: Impaired Brief Interview for Mental Status (BIMS) Repetition of Three Words (First Attempt): 3 Temporal Orientation: Year: Missed by more than 5 years Temporal Orientation: Month: Accurate within 5 days Temporal Orientation: Day: Correct Recall: "Sock": Yes, no cue required Recall: "Blue": Yes, after cueing ("a color") Recall: "Bed": Yes, no cue required BIMS Summary Score: 11 Sensation Sensation Light Touch: Impaired by gross assessment Hot/Cold: Not tested Proprioception: Impaired by gross assessment Stereognosis: Impaired by gross assessment Coordination Gross Motor Movements are Fluid and Coordinated: No Fine Motor Movements are Fluid and Coordinated: No Finger Nose Finger Test: slowed motor movements with motor planning challenges on R side Motor  Motor Motor: Hemiplegia Motor - Skilled Clinical Observations: decreased proprioception and motor control of RUE  Trunk/Postural  Assessment  Cervical Assessment Cervical Assessment: Exceptions to [facility] (forward head) Thoracic Assessment Thoracic Assessment: Exceptions to [facility] (rounded shoudlers) Lumbar Assessment Lumbar Assessment: Exceptions to [facility] (posterior pelvic tilt) Postural Control Postural Control: Deficits on evaluation Righting Reactions: delayed.  Balance Balance Balance Assessed: Yes Static Sitting Balance Static Sitting - Balance Support: Feet supported;Left upper extremity supported Static Sitting - Level of Assistance: 4: Min assist;5: Stand by assistance (CGA) Dynamic Sitting Balance Dynamic Sitting - Balance Support: Feet unsupported;Left upper extremity supported Dynamic Sitting - Level of Assistance: 5: Stand by assistance;4: Min assist (CGA) Sitting balance - Comments: no Lob sitting edge of bed Static Standing Balance Static Standing - Balance Support: Bilateral upper extremity supported Static Standing - Level of Assistance: 3: Mod assist Dynamic Standing  Balance Dynamic Standing - Balance Support: Bilateral upper extremity supported;During functional activity Dynamic Standing - Level of Assistance: 6: Modified independent (Device/Increase time) Dynamic Standing - Balance Activities:  (ADLs) Extremity/Trunk Assessment RUE Assessment RUE Assessment: Exceptions to [facility] Passive Range of Motion (PROM) Comments: WFL Active Range of Motion (AROM) Comments: decreased shoudler flexion and supination General Strength Comments: 3-/5 overall LUE Assessment LUE Assessment: Within Functional Limits General Strength Comments: decreased strength d/t generalized deconditioning 4/5  Care Tool Care Tool Self Care Eating   Eating Assist Level: Moderate Assistance - Patient 50 - 74%    Oral Care    Oral Care Assist Level: Maximal assistance - Patient 25 - 49%    Bathing   Body parts bathed by patient: Right arm;Chest;Abdomen;Front perineal area;Right upper leg;Left upper leg;Face Body parts bathed  by helper: Buttocks;Left arm;Left lower leg;Right lower leg;Right arm   Assist Level: Moderate Assistance - Patient 50 - 74%    Upper Body Dressing(including orthotics)   What is the patient wearing?: Pull over shirt   Assist Level: Moderate Assistance - Patient 50 - 74%    Lower Body Dressing (excluding footwear)   What is the patient wearing?: Pants;Underwear/pull up Assist for lower body dressing: Total Assistance - Patient < 25%    Putting on/Taking off footwear   What is the patient wearing?: Socks Assist for footwear: Dependent - Patient 0%       Care Tool Toileting Toileting activity Toileting Activity did not occur (Clothing management and hygiene only): Refused Assist for toileting: Total Assistance - Patient < 25% (simulated)     Care Tool Bed Mobility Roll left and right activity   Roll left and right assist level: Moderate Assistance - Patient 50 - 74%    Sit to lying activity   Sit to lying assist level: Moderate Assistance - Patient 50 - 74%    Lying to sitting on side of bed activity   Lying to sitting on side of bed assist level: the ability to move from lying on the back to sitting on the side of the bed with no back support.: Moderate Assistance - Patient 50 - 74%     Care Tool Transfers Sit to stand transfer   Sit to stand assist level: Maximal Assistance - Patient 25 - 49%    Chair/bed transfer   Chair/bed transfer assist level: Total Assistance - Patient < 25%     Toilet transfer   Assist Level: Total Assistance - Patient < 25%     Care Tool Cognition  Expression of Ideas and Wants Expression of Ideas and Wants: 3. Some difficulty - exhibits some difficulty with expressing needs and ideas (e.g, some words or finishing thoughts) or speech is not clear  Understanding Verbal and Non-Verbal Content Understanding Verbal and Non-Verbal Content: 3. Usually understands - understands most conversations, but misses some part/intent of message. Requires cues  at times to understand   Memory/Recall Ability Memory/Recall Ability : Current season;That he or she is in a hospital/hospital unit   Refer to Care Plan for Long Term Goals  SHORT TERM GOAL WEEK 1 OT Short Term Goal 1 (Week 1): Pt will complete sit<>stands in preparation for ADLs using LRAD mod A OT Short Term Goal 2 (Week 1): Pt will recall hemi dressing techniques with min verbal cues OT Short Term Goal 3 (Week 1): Pt will complete squat pivot transfers mod Ax1  Recommendations for other services: Neuropsych   Skilled Therapeutic Intervention Skilled Therapeutic Interventions/Progress Updates:  1:1  OT evaluation and intervention initiated with skilled education provided on OT role, goals, and POC. Pt received sitting up in wc presenting to be in good spirits receptive to skilled OT session reporting 0/10 pain- OT offering intermittent rest breaks, repositioning, and therapeutic support to optimize participation in therapy session. Pt completed BADLs this session at levels listed below with skilled education provided on fall prevention, energy conservation, modified hemi techniques for BADLs, functional transfers, and skin break down prevention. Pt requesting to take sponge bath vs shower as sponge bathing with assistance is her PLOF. Pt fearful of falling and presenting with decreased self-efficacy this session stating "I cannot stand up without hurting you or falling" and "I am too much of a bourdon". Pt also become tearful x2 during session with OT providing maximal therapeutic support, encouragement, and ensuring Pt's safety when completing functional transfers. Pt would benefit from continued OT services in IPR setting. Pt was left resting in wc with call bell in reach, seat belt alarm on, and all needs met.   ADL ADL Eating: Moderate assistance Where Assessed-Eating: Wheelchair Grooming: Moderate assistance Where Assessed-Grooming: Sitting at sink Upper Body Bathing: Moderate  assistance Where Assessed-Upper Body Bathing: Sitting at sink;Wheelchair Lower Body Bathing: Moderate assistance Where Assessed-Lower Body Bathing: Wheelchair Upper Body Dressing: Moderate assistance Where Assessed-Upper Body Dressing: Wheelchair Lower Body Dressing: Dependent;Maximal assistance Where Assessed-Lower Body Dressing: Wheelchair Toileting: Dependent;Maximal assistance (simulated) Where Assessed-Toileting: Teacher, adult education: Dependent Statistician Method: Other (comment) Antony Salmon) Tub/Shower Transfer: Not assessed Film/video editor: Not assessed Film/video editor Method:  (Pt reports she completes sponge baths at baseline) Mobility  Bed Mobility Bed Mobility: Rolling Right;Rolling Left;Supine to Sit;Sit to Supine Rolling Right: Minimal Assistance - Patient > 75% Rolling Left: Maximal Assistance - Patient 25-49% Supine to Sit: Moderate Assistance - Patient 50-74% Sit to Supine: Moderate Assistance - Patient 50-74% Transfers Sit to Stand: Maximal Assistance - Patient 25-49% Stand to Sit: Maximal Assistance - Patient 25-49%   Discharge Criteria: Patient will be discharged from OT if patient refuses treatment 3 consecutive times without medical reason, if treatment goals not met, if there is a change in medical status, if patient makes no progress towards goals or if patient is discharged from hospital.  The above assessment, treatment plan, treatment alternatives and goals were discussed and mutually agreed upon: by patient  Army Fossa 08/01/2022, 12:44 PM

## 2022-08-01 NOTE — Progress Notes (Signed)
PROGRESS NOTE   Subjective/Complaints:  Pt states weakness on RIght side from prior stroke   ROS- neg CP, SOB, N/V/D Objective:   No results found. Recent Labs    07/31/22 1815 08/01/22 0601  WBC 7.8 5.8  HGB 12.2 11.9*  HCT 38.1 37.0  PLT 247 231   Recent Labs    07/31/22 1815 08/01/22 0601  NA  --  138  K  --  3.4*  CL  --  110  CO2  --  21*  GLUCOSE  --  104*  BUN  --  11  CREATININE 0.79 0.59  CALCIUM  --  8.2*    Intake/Output Summary (Last 24 hours) at 08/01/2022 0946 Last data filed at 08/01/2022 0807 Gross per 24 hour  Intake 460 ml  Output 100 ml  Net 360 ml        Physical Exam: Vital Signs Blood pressure 118/84, pulse 77, temperature (!) 97.5 F (36.4 C), temperature source Oral, resp. rate 17, height 5\' 2"  (1.575 m), weight 69.6 kg, SpO2 95%.   General: No acute distress Mood and affect are appropriate Heart: Regular rate and rhythm no rubs murmurs or extra sounds Lungs: Clear to auscultation, breathing unlabored, no rales or wheezes Abdomen: Positive bowel sounds, soft nontender to palpation, nondistended Extremities: No clubbing, cyanosis, or edema Skin: No evidence of breakdown, no evidence of rash Neurologic: Cranial nerves II through XII intact, motor strength is 5/5 in left , 3- Right deltoid, bicep, tricep, grip, 4/5 RIght and 5/5 left hip flexor, knee extensors, ankle dorsiflexor and plantar flexor Sensory exam normal sensation to light touch  bilateral upper and lower extremities- except Right hand and wrist   Musculoskeletal: Full range of motion in all 4 extremities. No joint swelling   Assessment/Plan: 1. Functional deficits which require 3+ hours per day of interdisciplinary therapy in a comprehensive inpatient rehab setting. Physiatrist is providing close team supervision and 24 hour management of active medical problems listed below. Physiatrist and rehab team continue  to assess barriers to discharge/monitor patient progress toward functional and medical goals  Care Tool:  Bathing              Bathing assist       Upper Body Dressing/Undressing Upper body dressing        Upper body assist      Lower Body Dressing/Undressing Lower body dressing            Lower body assist       Toileting Toileting    Toileting assist       Transfers Chair/bed transfer  Transfers assist           Locomotion Ambulation   Ambulation assist              Walk 10 feet activity   Assist           Walk 50 feet activity   Assist           Walk 150 feet activity   Assist           Walk 10 feet on uneven surface  activity   Assist  Wheelchair     Assist               Wheelchair 50 feet with 2 turns activity    Assist            Wheelchair 150 feet activity     Assist          Blood pressure 118/84, pulse 77, temperature (!) 97.5 F (36.4 C), temperature source Oral, resp. rate 17, height 5\' 2"  (1.575 m), weight 69.6 kg, SpO2 95%.  Medical Problem List and Plan: 1. 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  Antithrombotics: -DVT/anticoagulation:  Pharmaceutical: Lovenox             -antiplatelet therapy: Aspirin 81 mg daily and Plavix 75 mg daily x 90 days then aspirin alone 3. Pain Management: Tylenol as needed 4. Mood/Behavior/Sleep: Provide emotional support             -antipsychotic agents: N/A 5. Neuropsych/cognition: This patient is capable of making decisions on her own behalf. 6. Skin/Wound  Care: Routine skin checks 7. Fluids/Electrolytes/Nutrition: Routine in and outs with follow-up chemistries 8.  Permissive hypertension.  Monitor with increased mobility Vitals:   07/31/22 1948 08/01/22 0422  BP: 136/76 118/84  Pulse: 97 77  Resp: 18 17  Temp: (!) 97.5 F (36.4 C) (!) 97.5 F (36.4 C)  SpO2: 98% 95%   Normotensive off BP meds  9.  Hyperlipidemia.  Lipitor Lipid Panel     Component Value Date/Time   CHOL 184 07/28/2022 0523   TRIG 130 07/28/2022 0523   HDL 35 (L) 07/28/2022 0523   CHOLHDL 5.3 07/28/2022 0523   VLDL 26 07/28/2022 0523   LDLCALC 123 (H) 07/28/2022 0523    10.  History of alcohol tobacco use.  Counseling, nicoderm patch  11.  Overweight  BMI 28.47.  Dietary follow-up 12.  Medical noncompliance.  Counseling- doesn't like to take meds, esp HTN meds.     LOS: 1 days A FACE TO FACE EVALUATION WAS PERFORMED  Erick Colace 08/01/2022, 9:46 AM

## 2022-08-01 NOTE — Progress Notes (Signed)
Patient ID: Melissa Colon, female   DOB: 01-Jan-1961, 62 y.o.   MRN: [id number]  Team Conference Report to Patient/Family  Team Conference discussion was reviewed with the patient and caregiver, including goals, any changes in plan of care and target discharge date.  Patient and caregiver express understanding and are in agreement.  The patient has a target discharge date of  (evals pending).  SW met with patient, introduced self and explained role. SW informed patient that currently evaluation are pending. SW will follow up with patient and spouse next week for updates. No additional questions or concerns.   Andria Rhein 08/01/2022, 1:16 PM

## 2022-08-01 NOTE — Progress Notes (Signed)
 Inpatient Rehabilitation  Patient information reviewed and entered into eRehab system by Melissa M. Bowie, M.A., CCC/SLP, PPS Coordinator.  Information including medical coding, functional ability and quality indicators will be reviewed and updated through discharge.    

## 2022-08-01 NOTE — Progress Notes (Signed)
Inpatient Rehabilitation Care Coordinator Assessment and Plan Patient Details  Name: Melissa Colon MRN: [id number] Date of Birth: 04/07/1960  Today's Date: 08/01/2022  Hospital Problems: Principal Problem:   Acute ischemic right MCA stroke [facility])  Past Medical History:  Past Medical History:  Diagnosis Date   Alcohol dependence (HCC)    H/O brain tumor    Smoker    Past Surgical History:  Past Surgical History:  Procedure Laterality Date   CRANIOTOMY FOR TUMOR     Social History:  reports that she has been smoking cigarettes. She has never used smokeless tobacco. She reports current alcohol use of about 8.0 standard drinks of alcohol per week. She reports that she does not currently use drugs.  Family / Support Systems Marital Status: Married How Long?: n/a Patient Roles: Spouse Spouse/Significant Other: Jomarie Longs Children: son, [name] Other Supports: Brother, Ambulance person Anticipated Caregiver: Brother, [name], Son, Scientific laboratory technician and spouse Ability/Limitations of Caregiver: none Caregiver Availability: 24/7 Family Dynamics: supportive family  Social History Preferred language: English Religion:  Cultural Background: Patient WC mobility past 3 years. Prior patient of CIR Education: HS Health Literacy - How often do you need to have someone help you when you read instructions, pamphlets, or other written material from your doctor or pharmacy?: Never Writes: Yes Employment Status: Disabled Date Retired/Disabled/Unemployed: n/a Marine scientist Issues: N/A Guardian/Conservator: Jason/Joseph   Abuse/Neglect Abuse/Neglect Assessment Can Be Completed: Yes Physical Abuse: Denies Verbal Abuse: Denies Sexual Abuse: Denies Exploitation of patient/patient's resources: Denies Self-Neglect: Denies  Patient response to: Social Isolation - How often do you feel lonely or isolated from those around you?: Never  Emotional Status Pt's affect, behavior and adjustment status:  Pleasant Recent Psychosocial Issues: Coping Psychiatric History: N/A Substance Abuse History: Smoking and alcohol  Patient / Family Perceptions, Expectations & Goals Pt/Family understanding of illness & functional limitations: Yes Premorbid pt/family roles/activities: Patient requires assistance prior with ADLS, andWC mobility Anticipated changes in roles/activities/participation: Assist from spouse, son and brother Pt/family expectations/goals: Supervision- Min A  [employer] Agencies: None Premorbid Home Care/DME Agencies: Other (Comment) (WC, SPC, RW, BSC, Shower seat) Transportation available at discharge: family Is the patient able to respond to transportation needs?: Yes In the past 12 months, has lack of transportation kept you from medical appointments or from getting medications?: No In the past 12 months, has lack of transportation kept you from meetings, work, or from getting things needed for daily living?: No Resource referrals recommended: Neuropsychology  Discharge Planning Living Arrangements: Spouse/significant other, Children, Other relatives Support Systems: Spouse/significant other, Children, Other relatives Type of Residence: Private residence (1 level, 4 steps) Insurance Resources: [employer] (specify county) Architect: SSD, Garment/textile technologist Screen Referred: No Living Expenses: Lives with family Money Management: Patient, Spouse Does the patient have any problems obtaining your medications?: No Home Management: Patient required assistance prior from spouse Patient/Family Preliminary Plans: Family able to assist Care Coordinator Barriers to Discharge: Lack of/limited family support, Insurance for SNF coverage, Decreased caregiver support, Incontinence Care Coordinator Anticipated Follow Up Needs: HH/OP Expected length of stay: 14-16 Days  Clinical Impression SW met with patient, introduced self and explained role. SW informed  patient that currently evaluation are pending. SW will follow up with patient and spouse next week for updates. No additional questions or concerns.   Patient discharging home with assistance from spouse, brother and son. 1 level home, 4 steps to enter.   Andria Rhein 08/01/2022, 3:18 PM

## 2022-08-01 NOTE — Plan of Care (Signed)
Problem: RH Balance Goal: LTG: Patient will maintain dynamic sitting balance (OT) Description: LTG:  Patient will maintain dynamic sitting balance with assistance during activities of daily living (OT) Flowsheets (Taken 08/01/2022 1640) LTG: Pt will maintain dynamic sitting balance during ADLs with: Supervision/Verbal cueing Goal: LTG Patient will maintain dynamic standing with ADLs (OT) Description: LTG:  Patient will maintain dynamic standing balance with assist during activities of daily living (OT)  Flowsheets (Taken 08/01/2022 1640) LTG: Pt will maintain dynamic standing balance during ADLs with: Minimal Assistance - Patient > 75%   Problem: Sit to Stand Goal: LTG:  Patient will perform sit to stand in prep for activites of daily living with assistance level (OT) Description: LTG:  Patient will perform sit to stand in prep for activites of daily living with assistance level (OT) Flowsheets (Taken 08/01/2022 1640) LTG: PT will perform sit to stand in prep for activites of daily living with assistance level: Minimal Assistance - Patient > 75%   Problem: RH Eating Goal: LTG Patient will perform eating w/assist, cues/equip (OT) Description: LTG: Patient will perform eating with assist, with/without cues using equipment (OT) Flowsheets (Taken 08/01/2022 1640) LTG: Pt will perform eating with assistance level of: Supervision/Verbal cueing LTG: Pt will perform eating using equipment:  Built up handle  Scoop plate  Weighted utensil   Problem: RH Grooming Goal: LTG Patient will perform grooming w/assist,cues/equip (OT) Description: LTG: Patient will perform grooming with assist, with/without cues using equipment (OT) Flowsheets (Taken 08/01/2022 1640) LTG: Pt will perform grooming with assistance level of: Supervision/Verbal cueing   Problem: RH Bathing Goal: LTG Patient will bathe all body parts with assist levels (OT) Description: LTG: Patient will bathe all body parts with assist levels  (OT) Flowsheets (Taken 08/01/2022 1640) LTG: Pt will perform bathing with assistance level/cueing: Minimal Assistance - Patient > 75% LTG: Position pt will perform bathing: At sink   Problem: RH Dressing Goal: LTG Patient will perform upper body dressing (OT) Description: LTG Patient will perform upper body dressing with assist, with/without cues (OT). Flowsheets (Taken 08/01/2022 1640) LTG: Pt will perform upper body dressing with assistance level of: Minimal Assistance - Patient > 75% Goal: LTG Patient will perform lower body dressing w/assist (OT) Description: LTG: Patient will perform lower body dressing with assist, with/without cues in positioning using equipment (OT) Flowsheets (Taken 08/01/2022 1640) LTG: Pt will perform lower body dressing with assistance level of: Minimal Assistance - Patient > 75%   Problem: RH Toileting Goal: LTG Patient will perform toileting task (3/3 steps) with assistance level (OT) Description: LTG: Patient will perform toileting task (3/3 steps) with assistance level (OT)  Flowsheets (Taken 08/01/2022 1640) LTG: Pt will perform toileting task (3/3 steps) with assistance level: Minimal Assistance - Patient > 75%   Problem: RH Vision Goal: RH LTG Vision Consulting civil engineer) Flowsheets (Taken 08/01/2022 1640) LTG: Vision Goals: Pt will utlize compensatory diplopia techniques during functional activities and BADLs with supervision   Problem: RH Functional Use of Upper Extremity Goal: LTG Patient will use RT/LT upper extremity as a (OT) Description: LTG: Patient will use right/left upper extremity as a stabilizer/gross assist/diminished/nondominant/dominant level with assist, with/without cues during functional activity (OT) Flowsheets (Taken 08/01/2022 1640) LTG: Use of upper extremity in functional activities: RUE as gross assist level LTG: Pt will use upper extremity in functional activity with assistance level of: Supervision/Verbal cueing   Problem: RH Toilet  Transfers Goal: LTG Patient will perform toilet transfers w/assist (OT) Description: LTG: Patient will perform toilet transfers with assist, with/without  cues using equipment (OT) Flowsheets (Taken 08/01/2022 1640) LTG: Pt will perform toilet transfers with assistance level of: Minimal Assistance - Patient > 75%   Problem: RH Memory Goal: LTG Patient will demonstrate ability for day to day recall/carry over during activities of daily living with assistance level (OT) Description: LTG:  Patient will demonstrate ability for day to day recall/carry over during activities of daily living with assistance level (OT). Flowsheets (Taken 08/01/2022 1640) LTG:  Patient will demonstrate ability for day to day recall/carry over during activities of daily living with assistance level (OT): Supervision

## 2022-08-02 DIAGNOSIS — I63511 Cerebral infarction due to unspecified occlusion or stenosis of right middle cerebral artery: Secondary | ICD-10-CM | POA: Diagnosis not present

## 2022-08-02 MED ORDER — DOCUSATE SODIUM 100 MG PO CAPS
100.0000 mg | ORAL_CAPSULE | Freq: Every day | ORAL | Status: DC
  Administered 2022-08-02 – 2022-08-17 (×16): 100 mg via ORAL
  Filled 2022-08-02 (×16): qty 1

## 2022-08-02 NOTE — Progress Notes (Signed)
Occupational Therapy Session Note  Patient Details  Name: Melissa Colon MRN: [id number] Date of Birth: 10/02/1960  Today's Date: 08/02/2022 OT Individual Time: 1450-1530 OT Individual Time Calculation (min): 40 min    Short Term Goals: Week 1:  OT Short Term Goal 1 (Week 1): Pt will complete sit<>stands in preparation for ADLs using LRAD mod A OT Short Term Goal 2 (Week 1): Pt will recall hemi dressing techniques with min verbal cues OT Short Term Goal 3 (Week 1): Pt will complete squat pivot transfers mod Ax1  Skilled Therapeutic Interventions/Progress Updates:     Pt received returning from using restroom with nursing staff. Pt presenting to be in good spirits receptive to skilled OT session reporting 0/10 pain- OT offering intermittent rest breaks, repositioning, and therapeutic support to optimize participation in therapy session. Pt reporting challenges getting in/out of hospital bed at beginning of session and fear of hurting hospital staff. Education provided on using bed features to support bed mobility and modified techniques for getting out of bed to increase Pt's independence. Provide reassurance that Pt will not hurt hospital staff and that they are trained to assist with transfers, however Pt became tearful and began to make negative statements about herself demonstrating decreased self-efficacy and reporting feeling like she is a bourdon. OT provided therapeutic support and engaged Pt in positive self-talk activities to increase moral with Pt instructed to state three positive statements about herself. Pt able to state 1/3 requiring max A to identify 2 additional positive statement with noted improvement in moral following activity. Remainder of session focused on RUE NMR to increase functional use for BADL. Placed stockinet on Pt's RUE for skin protection with education provided on purpose and Pt receptive and verbalizing understanding. Guided Pt through completing series of  functional reaching and grasp/release activities to increase motor control and awareness of RUE. Pt initially instructed to completed grasp/release of wash cloth and foam block. Graded up task to include targeted reach and functional movement patterns moving through PNF D1 & D2 patterns and anterior reaching to pass block to OT. Pt then instructed to transport small cubed from table top to cup using R hand. Pt noted to utilize compensatory gross grasp to pick up small blocks vs pincer grasp. Pt present with decreased motor control, challenges grading force required for task, and ataxic UE movements during functional reaching and FM activities. Pt was left resting in wc with call bell in reach, seat belt alarm on, and all needs met.    Therapy Documentation Precautions:  Precautions Precautions: Fall Precaution Comments: right UE and LE hemiparesis. Restrictions Weight Bearing Restrictions: No   Therapy/Group: Individual Therapy  Army Fossa 08/02/2022, 3:43 PM

## 2022-08-02 NOTE — Progress Notes (Signed)
Physical Therapy Session Note  Patient Details  Name: Melissa Colon MRN: [id number] Date of Birth: [birth year]  Today's Date: 08/02/2022 PT Individual Time: 0100-0200 PT Individual Time Calculation (min): 60 min   Short Term Goals: Week 1:  PT Short Term Goal 1 (Week 1): Pt will roll side to side w/ mod A. PT Short Term Goal 2 (Week 1): Pt will transfer sup to sit w/ mod A PT Short Term Goal 3 (Week 1): Pt will transfer sit to stand/SPT w/ mod A. PT Short Term Goal 4 (Week 1): PT will assess gait.  Skilled Therapeutic Interventions/Progress Updates:   Pt was received sitting in wheelchair finishing lunch. Pt agreeable to PT tx. No complaints of pain. All therex focused on quality of control of movement. PT encouraged pt to maintain eye contact with R extremities with movement to maintain focus on movement quality.  LAQ x 10 with 2-3 sec hold R, iso LAQ with ankle pump R x 20, hip abduct/adduction combo x 20 R. All with focus on R LE control.  R hand flexion/splay x 10, R wrist ext AROM to neutral x 15; resisted R elbow flexion/extension maintaining arm in neutral with assistance by PT due to pt wanting to cross body with elbow flexion.  L hip circles into ER x 20, iso L LAQ with ankle pump x 20  Pt declined standing/STS due to fear of R knee buckling. Even with PT encouragement, pt declined.  AAROM bil Ues x 20 with PT educating pt on what frozen shoulder is.  Sitting trunk control exercises handing objects to PT x 20 with pt sitting back in chair and then edge of chair.  Bil LAQ x 20 with 2-3 sec hold with concentration on maintaining Les in contact with each other; bil hip abduction/adduction x 20 each.  Pt left sitting in wheelchair, seat belt alarm on, call light within reach. All needs within reach. No complaints of pain.     Therapy Documentation Precautions:  Precautions Precautions: Fall Precaution Comments: right UE and LE hemiparesis. Restrictions Weight Bearing  Restrictions: No   Therapy/Group: Individual Therapy  Luna Fuse 08/02/2022, 12:45 PM

## 2022-08-02 NOTE — Progress Notes (Signed)
Physical Therapy Session Note  Patient Details  Name: Melissa Colon MRN: [id number] Date of Birth: 1961-01-08  Today's Date: 08/02/2022 PT Individual Time: 7893-8101 PT Individual Time Calculation (min): 56 min   Short Term Goals: Week 1:  PT Short Term Goal 1 (Week 1): Pt will roll side to side w/ mod A. PT Short Term Goal 2 (Week 1): Pt will transfer sup to sit w/ mod A PT Short Term Goal 3 (Week 1): Pt will transfer sit to stand/SPT w/ mod A. PT Short Term Goal 4 (Week 1): PT will assess gait.  Skilled Therapeutic Interventions/Progress Updates:    Pt presents in room in [facility], agreeable to PT, RN present to administer pain medication due to pt reporting pain in low back following PT this AM. Session focused on NMR for standing balance, weightshifting, RLE weight acceptance, and RLE coordination.  Pt transported to main gym dependently for time management and energy conservation. Pt completes sit<>stand in //bars with light min assist, therapist blocking R knee. Pt completes NMR in standing to improve postural stability, RLE weight acceptance, RLE coordination including: - standing weightshifting 2x1 minute (cue to bring R hip to R hand rail, RUE placed on handrail to increase awareness to R side with increased tactile input) - standing marching x10 AAROM RLE (unbalanced muscle activation, improves with repetition with pt demonstrating hip extensor tone with initiating hip flexion) - standing marching x10 LLE (max assist for blocking R knee with pt demonstrating knee flexion and attempting to place forearm on //bar for increased stability  Pt distractible and despite verbal cues attempts ambulation in //bars requiring frequent seated rest breaks and redirection to specific task and part practice.  Pt attempts ambulation x3 trials in //bars however with increased fatigue demonstrates poor ability to initiate hip flexion with RLE, demonstrates taking 3 steps at a time requiring max assist  for RLE foot placement.  Pt would benefit from ankle brace and shoe to promote improved ankle stability with ambulation as pt demonstrating R ankle eversion and plantar flexion with advance phase.  Pt completes then completes seated NMR with BLE AROM for reciprocal BLE movement including: Alternating BLE marching 30 seconds Alternating BLE LAQs 30 seconds R knee flexion/extension rolling ball x15 (active assist with max verbal cues for control)  Pt transported back to room dependently in WC. Pt remains seated in WC with all needs within reach, call light in place, and chair alarm donned and activated at end of session.  Therapy Documentation Precautions:  Precautions Precautions: Fall Precaution Comments: right UE and LE hemiparesis. Restrictions Weight Bearing Restrictions: No   Therapy/Group: Individual Therapy  Edwin Cap PT, DPT 08/02/2022, 4:41 PM

## 2022-08-02 NOTE — Progress Notes (Signed)
Physical Therapy Session Note  Patient Details  Name: Melissa Colon MRN: [id number] Date of Birth: 05/25/1960  Today's Date: 08/02/2022 PT Individual Time: 0933-1000 PT Individual Time Calculation (min): 27 min   Short Term Goals: Week 1:  PT Short Term Goal 1 (Week 1): Pt will roll side to side w/ mod A. PT Short Term Goal 2 (Week 1): Pt will transfer sup to sit w/ mod A PT Short Term Goal 3 (Week 1): Pt will transfer sit to stand/SPT w/ mod A. PT Short Term Goal 4 (Week 1): PT will assess gait.  Skilled Therapeutic Interventions/Progress Updates: Pt presented in w/c agreeable to therapy. Pt denies pain at rest, expression a fair bit of frustration due to situation. Provided therapeutic listening and support. Pt transported to main gym hallway for time management. Performed Sit to stand with modA at wall rail with PTA blocking R knee. Pt worked on lateral weight shifting for increased tolerance of weight bearing through RLE. Pt noted initially not to buckle therefore tried weight shifting in sagittal plane stepping forward backwards with LLE. Pt with mild to moderate forward flexion when stepping through but able to correct with cues. Pt therefore initiated gait training at wall with second person providing w/c follow. Pt ambulated 63ft and 64ft with heavy mod to maxA. Pt was able to advance RLE but noted mild ankle instability with foot rolling into supination x 2 requiring PTA to correct. PTA blocked R knee as pt advanced LLE. Pt required max multimodal cues to weight shift through RLE to clear LLE from ground. With increased fatigue pt noted to have more forward flexed posture and required increased assistance to correct. Pt transported back to room at end of session and remained in w/c with belt alarm on, call bell within reach and needs met.      Therapy Documentation Precautions:  Precautions Precautions: Fall Precaution Comments: right UE and LE hemiparesis. Restrictions Weight  Bearing Restrictions: No General:   Vital Signs: Therapy Vitals Temp: (!) 97.5 F (36.4 C) Temp Source: Oral Pulse Rate: 72 Resp: 17 BP: 136/72 Patient Position (if appropriate): Sitting Oxygen Therapy SpO2: 99 % O2 Device: Room Air    Therapy/Group: Individual Therapy    08/02/2022, 4:32 PM

## 2022-08-02 NOTE — Progress Notes (Signed)
PROGRESS NOTE   Subjective/Complaints:  No issues overnite , per RN strained to have BM this am, no abd pain   ROS- neg CP, SOB, N/V/D Objective:   No results found. Recent Labs    07/31/22 1815 08/01/22 0601  WBC 7.8 5.8  HGB 12.2 11.9*  HCT 38.1 37.0  PLT 247 231   Recent Labs    07/31/22 1815 08/01/22 0601  NA  --  138  K  --  3.4*  CL  --  110  CO2  --  21*  GLUCOSE  --  104*  BUN  --  11  CREATININE 0.79 0.59  CALCIUM  --  8.2*    Intake/Output Summary (Last 24 hours) at 08/02/2022 0804 Last data filed at 08/02/2022 0510 Gross per 24 hour  Intake 170 ml  Output 300 ml  Net -130 ml        Physical Exam: Vital Signs Blood pressure 123/71, pulse 74, temperature (!) 97.5 F (36.4 C), temperature source Oral, resp. rate 18, height 5\' 2"  (1.575 m), weight 69.6 kg, SpO2 95%.   General: No acute distress Mood and affect are appropriate Heart: Regular rate and rhythm no rubs murmurs or extra sounds Lungs: Clear to auscultation, breathing unlabored, no rales or wheezes Abdomen: Positive bowel sounds, soft nontender to palpation, nondistended Extremities: No clubbing, cyanosis, or edema Skin: No evidence of breakdown, no evidence of rash Neurologic: Cranial nerves II through XII intact, motor strength is 5/5 in left , 3- Right deltoid, bicep, tricep, grip, 4/5 RIght and 5/5 left hip flexor, knee extensors, ankle dorsiflexor and plantar flexor Sensory exam normal sensation to light touch  bilateral upper and lower extremities- except Right hand and wrist  No evidence of visual field cut Mild dysarthria , no word finding issues  Musculoskeletal: Full range of motion in all 4 extremities. No joint swelling   Assessment/Plan: 1. Functional deficits which require 3+ hours per day of interdisciplinary therapy in a comprehensive inpatient rehab setting. Physiatrist is providing close team supervision and 24  hour management of active medical problems listed below. Physiatrist and rehab team continue to assess barriers to discharge/monitor patient progress toward functional and medical goals  Care Tool:  Bathing    Body parts bathed by patient: Right arm, Chest, Abdomen, Front perineal area, Right upper leg, Left upper leg, Face   Body parts bathed by helper: Buttocks, Left arm, Left lower leg, Right lower leg, Right arm     Bathing assist Assist Level: Moderate Assistance - Patient 50 - 74%     Upper Body Dressing/Undressing Upper body dressing   What is the patient wearing?: Pull over shirt    Upper body assist Assist Level: Moderate Assistance - Patient 50 - 74%    Lower Body Dressing/Undressing Lower body dressing      What is the patient wearing?: Pants, Underwear/pull up     Lower body assist Assist for lower body dressing: Total Assistance - Patient < 25%     Toileting Toileting Toileting Activity did not occur Press photographer and hygiene only): Refused  Toileting assist Assist for toileting: Total Assistance - Patient < 25% (simulated)  Transfers Chair/bed transfer  Transfers assist     Chair/bed transfer assist level: Maximal Assistance - Patient 25 - 49%     Locomotion Ambulation   Ambulation assist   Ambulation activity did not occur: Safety/medical concerns          Walk 10 feet activity   Assist  Walk 10 feet activity did not occur: Safety/medical concerns        Walk 50 feet activity   Assist Walk 50 feet with 2 turns activity did not occur: Safety/medical concerns         Walk 150 feet activity   Assist Walk 150 feet activity did not occur: Safety/medical concerns         Walk 10 feet on uneven surface  activity   Assist Walk 10 feet on uneven surfaces activity did not occur: Safety/medical concerns         Wheelchair     Assist Is the patient using a wheelchair?: Yes Type of Wheelchair: Manual     Wheelchair assist level: Dependent - Patient 0%      Wheelchair 50 feet with 2 turns activity    Assist        Assist Level: Dependent - Patient 0%   Wheelchair 150 feet activity     Assist      Assist Level: Dependent - Patient 0%   Blood pressure 123/71, pulse 74, temperature (!) 97.5 F (36.4 C), temperature source Oral, resp. rate 18, height 5\' 2"  (1.575 m), weight 69.6 kg, SpO2 95%.  Medical Problem List and Plan: 1. 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  Antithrombotics: -DVT/anticoagulation:  Pharmaceutical: Lovenox             -antiplatelet therapy: Aspirin 81 mg daily and Plavix 75 mg daily x 90 days then aspirin alone 3. Pain Management: Tylenol as needed 4. Mood/Behavior/Sleep: Provide emotional support             -antipsychotic agents: N/A 5. Neuropsych/cognition: This patient is capable of making decisions on her own behalf. 6. Skin/Wound Care: Routine skin checks 7. Fluids/Electrolytes/Nutrition: Routine in and outs with follow-up chemistries 8.  Permissive hypertension.  Monitor with increased mobility Vitals:   08/01/22 2047 08/02/22 0504  BP: 136/66 123/71  Pulse: 83 74  Resp: 16 18  Temp: 97.7 F (36.5 C) (!) 97.5 F (36.4 C)  SpO2: 94% 95%   Normotensive off BP meds  9.  Hyperlipidemia.  Lipitor Lipid Panel     Component Value Date/Time   CHOL 184 07/28/2022 0523   TRIG 130 07/28/2022 0523   HDL 35 (L) 07/28/2022 0523   CHOLHDL 5.3 07/28/2022 0523   VLDL 26 07/28/2022 0523   LDLCALC 123 (H) 07/28/2022 0523    10.  History of alcohol tobacco use.  Counseling, nicoderm  patch  11.  Overweight  BMI 28.47.  Dietary follow-up 12.  Medical noncompliance.  Counseling- doesn't like to take meds, esp HTN meds.   13.  Straining to have BM, will add colace  14.  Hypokalemia s/p supplementation recheck K +  in am  LOS: 2 days A FACE TO FACE EVALUATION WAS PERFORMED  Erick Colace 08/02/2022, 8:04 AM

## 2022-08-03 LAB — BASIC METABOLIC PANEL
Anion gap: 9 (ref 5–15)
BUN: 18 mg/dL (ref 6–20)
CO2: 21 mmol/L — ABNORMAL LOW (ref 22–32)
Calcium: 8.1 mg/dL — ABNORMAL LOW (ref 8.9–10.3)
Chloride: 109 mmol/L (ref 98–111)
Creatinine, Ser: 0.57 mg/dL (ref 0.44–1.00)
GFR, Estimated: >60 mL/min (ref >60)
Glucose, Bld: 95 mg/dL (ref 70–99)
Potassium: 3.2 mmol/L — ABNORMAL LOW (ref 3.5–5.1)
Sodium: 139 mmol/L (ref 135–145)

## 2022-08-03 NOTE — Progress Notes (Signed)
Physical Therapy Session Note  Patient Details  Name: Melissa Colon MRN: [id number] Date of Birth: May 20, 1960  Today's Date: 08/03/2022 PT Individual Time: 1102-1200 PT Individual Time Calculation (min): 58 min   Short Term Goals: Week 1:  PT Short Term Goal 1 (Week 1): Pt will roll side to side w/ mod A. PT Short Term Goal 2 (Week 1): Pt will transfer sup to sit w/ mod A PT Short Term Goal 3 (Week 1): Pt will transfer sit to stand/SPT w/ mod A. PT Short Term Goal 4 (Week 1): PT will assess gait.  Skilled Therapeutic Interventions/Progress Updates: Pt presents sitting in w/c and agreeable to therapy.  Pt wheeled to main gym into // bars.  Pt performed sit to stand w/ mod A and blocking R knee, manual A to place and maintain R hand on bar.  Pt performed standing, weight-shifting to R for increased input to R extremities.  Pt performed stepping w/ LLE only, blocking R knee, R hand placement/maintenance.  Pt practicing scooting forward and back in w/c w/ cueing for lean to Left for increased ease of R hip.  Pt performed LAQ and hip flexion w/ 2.5# 7-10 reps.  Increased rotation of R hip  to increase height w/ R hip.  Pt performed seated reaching and placement of R UE on bar.  Pt performed seated self grasp of paretic UE for increased forward lean to tap PT hand.  Pt remained sitting in w/c w/ chair alarm on and all needs in reach.  1/2 tray removed for improved positioning of tray table to eat.     Therapy Documentation Precautions:  Precautions Precautions: Fall Precaution Comments: right UE and LE hemiparesis. Restrictions Weight Bearing Restrictions: No General:   Vital Signs:   Pain: 5/10 LB and L hip      Therapy/Group: Individual Therapy  Lucio Edward 08/03/2022, 12:02 PM

## 2022-08-03 NOTE — Progress Notes (Signed)
Physical Therapy Session Note  Patient Details  Name: Melissa Colon MRN: [id number] Date of Birth: April 07, 1960  Today's Date: 08/03/2022 PT Individual Time: 3557-3220 PT Individual Time Calculation (min): 20 min  Today's Date: 08/03/2022 PT Missed Time: 10 Minutes Missed Time Reason: Other (Comment) (RN providing mording medication)  Short Term Goals: Week 1:  PT Short Term Goal 1 (Week 1): Pt will roll side to side w/ mod A. PT Short Term Goal 2 (Week 1): Pt will transfer sup to sit w/ mod A PT Short Term Goal 3 (Week 1): Pt will transfer sit to stand/SPT w/ mod A. PT Short Term Goal 4 (Week 1): PT will assess gait.  Skilled Therapeutic Interventions/Progress Updates: Patient supine in bed with RN present providing morning medication on entrance to room. Patient alert and agreeable to PT session.   Patient reported no pain at beginning of session.  Therapeutic Activity: Bed Mobility: Pt performed supine to sit (HOB lowered but still slightly elevated) with supervision for safety. Pt cued to use HOB rail to assist with pulling self over to L side. Pt then performed anterior scoot  to EOB with CGA for tactile cuing. Pt use of L HOB rail to assist in scoot, but then progressed to pushing into bed (pt educated that railing may not always be an option to assist). Pt then performed sit from EOB to supine with cuing to laterally lean to L to elevate LE's onto bed. Pt performed with close supervision. Pt then required adjustment in bed and was instructed to use L elbow to assist in truncal elevate to scoot towards center, and to use B LE's to perform bridge to adjust hips towards center. Pt performed 3 rounds and was CGA/supervision throughout.   Patient supine in bed at end of session with brakes locked, bed alarm set, and all needs within reach.      Therapy Documentation Precautions:  Precautions Precautions: Fall Precaution Comments: right UE and LE hemiparesis. Restrictions Weight  Bearing Restrictions: No   Therapy/Group: Individual Therapy    PTA 08/03/2022, 12:18 PM

## 2022-08-03 NOTE — Progress Notes (Signed)
PROGRESS NOTE   Subjective/Complaints:  Working PT, no c/os  ROS- neg CP, SOB, N/V/D Objective:   No results found. Recent Labs    07/31/22 1815 08/01/22 0601  WBC 7.8 5.8  HGB 12.2 11.9*  HCT 38.1 37.0  PLT 247 231   Recent Labs    07/31/22 1815 08/01/22 0601  NA  --  138  K  --  3.4*  CL  --  110  CO2  --  21*  GLUCOSE  --  104*  BUN  --  11  CREATININE 0.79 0.59  CALCIUM  --  8.2*    Intake/Output Summary (Last 24 hours) at 08/03/2022 1610 Last data filed at 08/03/2022 0830 Gross per 24 hour  Intake 594 ml  Output 225 ml  Net 369 ml        Physical Exam: Vital Signs Blood pressure 137/63, pulse 74, temperature 98.2 F (36.8 C), temperature source Oral, resp. rate 18, height 5\' 2"  (1.575 m), weight 69.6 kg, SpO2 96%.   General: No acute distress Mood and affect are appropriate Heart: Regular rate and rhythm no rubs murmurs or extra sounds Lungs: Clear to auscultation, breathing unlabored, no rales or wheezes Abdomen: Positive bowel sounds, soft nontender to palpation, nondistended Extremities: No clubbing, cyanosis, or edema Skin: No evidence of breakdown, no evidence of rash Neurologic: Cranial nerves II through XII intact, motor strength is 5/5 in left , 3- Right deltoid, bicep, tricep, grip, 4/5 RIght and 5/5 left hip flexor, knee extensors, ankle dorsiflexor and plantar flexor Increase tone MAS 2 RIght finger flexors Sensory exam normal sensation to light touch  bilateral upper and lower extremities- except Right hand and wrist  No evidence of visual field cut Mild dysarthria , no word finding issues  Musculoskeletal: Full range of motion in all 4 extremities. No joint swelling   Assessment/Plan: 1. Functional deficits which require 3+ hours per day of interdisciplinary therapy in a comprehensive inpatient rehab setting. Physiatrist is providing close team supervision and 24 hour management  of active medical problems listed below. Physiatrist and rehab team continue to assess barriers to discharge/monitor patient progress toward functional and medical goals  Care Tool:  Bathing    Body parts bathed by patient: Right arm, Chest, Abdomen, Front perineal area, Right upper leg, Left upper leg, Face   Body parts bathed by helper: Buttocks, Left arm, Left lower leg, Right lower leg, Right arm     Bathing assist Assist Level: Moderate Assistance - Patient 50 - 74%     Upper Body Dressing/Undressing Upper body dressing   What is the patient wearing?: Pull over shirt    Upper body assist Assist Level: Moderate Assistance - Patient 50 - 74%    Lower Body Dressing/Undressing Lower body dressing      What is the patient wearing?: Pants, Underwear/pull up     Lower body assist Assist for lower body dressing: Total Assistance - Patient < 25%     Toileting Toileting Toileting Activity did not occur Press photographer and hygiene only): Refused  Toileting assist Assist for toileting: Total Assistance - Patient < 25% (simulated)     Transfers Chair/bed transfer  Transfers  assist     Chair/bed transfer assist level: Maximal Assistance - Patient 25 - 49%     Locomotion Ambulation   Ambulation assist   Ambulation activity did not occur: Safety/medical concerns          Walk 10 feet activity   Assist  Walk 10 feet activity did not occur: Safety/medical concerns        Walk 50 feet activity   Assist Walk 50 feet with 2 turns activity did not occur: Safety/medical concerns         Walk 150 feet activity   Assist Walk 150 feet activity did not occur: Safety/medical concerns         Walk 10 feet on uneven surface  activity   Assist Walk 10 feet on uneven surfaces activity did not occur: Safety/medical concerns         Wheelchair     Assist Is the patient using a wheelchair?: Yes Type of Wheelchair: Manual    Wheelchair  assist level: Dependent - Patient 0%      Wheelchair 50 feet with 2 turns activity    Assist        Assist Level: Dependent - Patient 0%   Wheelchair 150 feet activity     Assist      Assist Level: Dependent - Patient 0%   Blood pressure 137/63, pulse 74, temperature 98.2 F (36.8 C), temperature source Oral, resp. rate 18, height 5\' 2"  (1.575 m), weight 69.6 kg, SpO2 96%.  Medical Problem List and Plan: 1. 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  Antithrombotics: -DVT/anticoagulation:  Pharmaceutical: Lovenox             -antiplatelet therapy: Aspirin 81 mg daily and Plavix 75 mg daily x 90 days then aspirin alone 3. Pain Management: Tylenol as needed 4. Mood/Behavior/Sleep: Provide emotional support             -antipsychotic agents: N/A 5. Neuropsych/cognition: This patient is capable of making decisions on her own behalf. 6. Skin/Wound Care: Routine skin checks 7. Fluids/Electrolytes/Nutrition: Routine in and outs with follow-up chemistries 8.  Permissive hypertension.  Monitor with increased mobility Vitals:   08/02/22 1921 08/03/22 0551  BP: 133/73 137/63  Pulse: 81 74  Resp: 17 18  Temp: 97.6 F (36.4 C) 98.2 F (36.8 C)  SpO2: 96% 96%   Normotensive off BP meds 7/19 9.  Hyperlipidemia.  Lipitor Lipid Panel     Component Value Date/Time   CHOL 184 07/28/2022 0523   TRIG 130 07/28/2022 0523   HDL 35 (L) 07/28/2022 0523   CHOLHDL 5.3 07/28/2022 0523   VLDL 26 07/28/2022 0523   LDLCALC 123 (H) 07/28/2022 0523    10.  History of alcohol tobacco use.  Counseling, nicoderm patch  11.   Overweight  BMI 28.47.  Dietary follow-up 12.  Medical noncompliance.  Counseling- doesn't like to take meds, esp HTN meds.   13.  Straining to have BM, will add colace  14.  Hypokalemia s/p supplementation recheck K + in am  LOS: 3  days A FACE TO FACE EVALUATION WAS PERFORMED  Erick Colace 08/03/2022, 9:22 AM

## 2022-08-03 NOTE — Progress Notes (Signed)
Occupational Therapy Session Note  Patient Details  Name: Melissa Colon MRN: [id number] Date of Birth: 04-28-1960  Today's Date: 08/03/2022 OT Individual Time: 0902-1000 OT Individual Time Calculation (min): 58 min  Session 2: OT Individual Time: 4098-1191 OT Individual Time Calculation (min): 42 min   Short Term Goals: Week 1:  OT Short Term Goal 1 (Week 1): Pt will complete sit<>stands in preparation for ADLs using LRAD mod A OT Short Term Goal 2 (Week 1): Pt will recall hemi dressing techniques with min verbal cues OT Short Term Goal 3 (Week 1): Pt will complete squat pivot transfers mod Ax1  Skilled Therapeutic Interventions/Progress Updates:     Session 1:  Pt received using restroom with nursing staff. Pt presenting to be in good spirits receptive to skilled OT session reporting 0/10 pain- OT offering intermittent rest breaks, repositioning, and therapeutic support to optimize participation in therapy session. Direct hand-off from nursing staff to OT for therapy session with a focus on BADL retraining.   Provided increased amount of time on toilet d/t Pt reporting need for BM. Pt unable to have BM at this time, continent void with nursing staff informed. Worked on standing balance and sit<>stands while OT assisted with clothing management and posterior peri-care. Pt able to complete sit<>stand x2 trials during toileting with min A. Pt becoming tearful during task d/t requiring assistance with Pt reporting "I should be able to do these things on my own". OT provided therapeutic support and encouragement with gentle education provided on CVA recovery and impact on function with Pt receptive to education with improved moral noted.   Transported Pt out of room using stedy to wc with Pt able to complete sit<>stand with min A.   Educated Pt on modified hemi-dressing techniques with demonstration and step-by-step instructions provided to support learning. Pt presenting with decreased  attention requiring increased verbal cues and time to learn new strategy and sequence for dressing. Pt able to don shirt with supervision sitting in wc with maximal verbal cues for technique d/t new learning and attention deficits. Pt then able to weave feet into pants and recall correct sequence with min questioning cues. Utilized bed rail for UE support while completing sit<>stand with min A and OT stabilizing R ankle and knee. Pt able to briefly release LUE from bed rial and initiate bringing pants to waist with assistance required to fully bring pants over R hip.   Retrieved RW and R UE orthosis for RW support and brought to Pt's room to utilize for functional transfers during upcoming therapy sessions with education initiated on DME use and Pt receptive.  Pt was left resting in wc with call bell in reach, seat belt alarm on, and all needs met.    Session 2: Pt received sitting up in wc with husband and son present in room. Pt presenting to be in good spirits receptive to skilled OT session reporting 0/10 pain- OT offering intermittent rest breaks, repositioning, and therapeutic support to optimize participation in therapy session. Initiated providing family education on Pt's current status, etiology of CVA, and CVA recovery and answered Pt's family's questions regarding Pt's status. Pt's family unable to participate in remainder of therapy session today d/t need to leave hospital d/t other commitments. Pt's family receptive to basic education and supportive of Pt.   Transported Pt total A to therapy gym in wc. Engaged Pt in series of RUE NMR activities to increase functional use of RUE. Worked on reciprocal arm movements with weight bearing facilitation  during task with Pt instructed to complete push/pulling and circular motions with BUEs support on towels on table. Pt demonstrating challenges motor planning movements with RUE to coordinate with LUE. Education provided on providing self HOH assistance  to improve reciprocal arm movements with Pt able to complete with supervision. OT placed squigz of different shape and sizes on table top and instructed Pt to utilize RUE to retrieve squigz and place into bucket presented at eye level in R visual field. Facilitate alternate grasp patterns and maintained grasp with targeted reaching during task. Pt requiring increase amount of time for motor planning and initiating task. Mod verbal cues provided for motivation and to attend to task. Pt presenting with ataxic UE movements during functional reaching and challenges grading force required to retrieve and maintain grasp on squigz d/t decreased proprioception. OT provided min-mod hand-under-hand assistance to facilitate improved controlled reach and tactile cues for forearm pronation to release squigz into bucket. Donned 1.5# wrist weight on RUE to facilitate increased proprioceptive feed back for improved motor control and motor learning. Pt instructed to retrieve medium sized pegs from therapist presented at eye level, maintain grasp on pegs, and place into peg board. Mild improvement in dysmetria and decreased ataxia present with wrist weight donned. Pt utilizing compensatory grasp pattern leading to increased challenges to place into board with Pt able to place 2/6 pegs with maximal effort and increased time.   Transported Pt back to room total A in wc. Pt was left resting in wc with call bell in reach, seat belt alarm on, and all needs met.    Therapy Documentation Precautions:  Precautions Precautions: Fall Precaution Comments: right UE and LE hemiparesis. Restrictions Weight Bearing Restrictions: No   Therapy/Group: Individual Therapy  Army Fossa 08/03/2022, 7:47 AM

## 2022-08-03 NOTE — IPOC Note (Signed)
Overall Plan of Care [facility]) Patient Details Name: Melissa Colon MRN: [id number] DOB: Jun 13, 1960  Admitting Diagnosis: Acute ischemic right MCA stroke [facility])  Hospital Problems: Principal Problem:   Acute ischemic right MCA stroke (HCC)     Functional Problem List: Nursing Endurance, Medication Management, Safety, Behavior  PT Balance, Safety, Sensory, Endurance, Motor, Pain  OT Balance, Perception, Behavior, Safety, Cognition, Sensory, Edema, Skin Integrity, Endurance, Vision, Motor, Pain  SLP    TR         Basic ADL's: OT Toileting, Dressing, Bathing     Advanced  ADL's: OT       Transfers: PT Bed Mobility, Bed to Chair, Car, Occupational psychologist, Research scientist (life sciences): PT Ambulation, Stairs     Additional Impairments: OT Fuctional Use of Upper Extremity  SLP        TR      Anticipated Outcomes Item Anticipated Outcome  Self Feeding Set-up  Swallowing      Basic self-care  Min A  Toileting  Min A   Bathroom Transfers Min A  Bowel/Bladder  n/a  Transfers  min A  Locomotion  Min A w/ LRAD  Communication     Cognition     Pain  n/a  Safety/Judgment  manage safety w cues   Therapy Plan: PT Intensity: Minimum of 1-2 x/day ,45 to 90 minutes PT Frequency: 5 out of 7 days PT Duration Estimated Length of Stay: 2-3 weeks OT Intensity: Minimum of 1-2 x/day, 45 to 90 minutes OT Frequency: 5 out of 7 days OT Duration/Estimated Length of Stay: 2-3 weeks     Team Interventions: Nursing Interventions Disease Management/Prevention, Medication Management, Discharge Planning, Patient/Family Education  PT interventions Ambulation/gait training, Community reintegration, Neuromuscular re-education, Stair training, UE/LE Strength taining/ROM, Discharge planning, Therapeutic Activities, UE/LE Coordination activities, Functional mobility training, Patient/family education  OT Interventions Balance/vestibular training, Functional electrical stimulation,  Discharge planning, Pain management, Self Care/advanced ADL retraining, Therapeutic Activities, UE/LE Coordination activities, Cognitive remediation/compensation, Disease mangement/prevention, Functional mobility training, Patient/family education, Skin care/wound managment, Therapeutic Exercise, Visual/perceptual remediation/compensation, Firefighter, Fish farm manager, Neuromuscular re-education, Psychosocial support, Splinting/orthotics, UE/LE Strength taining/ROM, Wheelchair propulsion/positioning  SLP Interventions    TR Interventions    SW/CM Interventions Discharge Planning, Psychosocial Support, Patient/Family Education, Disease Management/Prevention   Barriers to Discharge MD  Medical stability  Nursing Decreased caregiver support, Home environment access/layout, Medication compliance 1 level 4 ste bil rails w spouse; has Dentures (specify type), Wheelchair, Environmental consultant (specify type)  Home Equipment: Gilmer Mor - single point, Agricultural consultant (2 wheels), BSC/3in1, Wheelchair - manual, Shower seat - built in  [employer] home environment, Curator    OT      SLP      SW Lack of/limited family support, Community education officer for SNF coverage, Decreased caregiver support, Incontinence     Team Discharge Planning: Destination: PT-Home ,OT- Home , SLP-  Projected Follow-up: PT-Home health PT, OT-  Home health OT, SLP-  Projected Equipment Needs: PT-To be determined, OT- To be determined, SLP-  Equipment Details: PT- , OT-  Patient/family involved in discharge planning: PT- Patient,  OT-Patient, SLP-   MD ELOS: 10-14d Medical Rehab Prognosis:  Good Assessment: The patient has been admitted for CIR therapies with the diagnosis of R MCA infarct. The team will be addressing functional mobility, strength, stamina, balance, safety, adaptive techniques and equipment, self-care, bowel and bladder mgt, patient and caregiver education, BP management . Goals have  been set at Min A. Anticipated discharge  destination is Home .        See Team Conference Notes for weekly updates to the plan of care

## 2022-08-04 DIAGNOSIS — I69359 Hemiplegia and hemiparesis following cerebral infarction affecting unspecified side: Secondary | ICD-10-CM

## 2022-08-04 DIAGNOSIS — R252 Cramp and spasm: Secondary | ICD-10-CM

## 2022-08-04 MED ORDER — METHOCARBAMOL 750 MG PO TABS
750.0000 mg | ORAL_TABLET | Freq: Three times a day (TID) | ORAL | Status: DC | PRN
  Administered 2022-08-14 – 2022-08-15 (×2): 750 mg via ORAL
  Filled 2022-08-04 (×2): qty 1

## 2022-08-04 NOTE — Progress Notes (Signed)
Orthopedic Tech Progress Note Patient Details:  Melissa Colon [birth year] [id number]  ASO lace-up ankle brace applied to RLE without complication. Pt expressed verbal understanding on how to apply/remove brace.  Ortho Devices Type of Ortho Device: ASO Ortho Device/Splint Location: RLE Ortho Device/Splint Interventions: Ordered, Application, Adjustment   Post Interventions Patient Tolerated: Well Instructions Provided: Care of device, Adjustment of device   Carmine Savoy 08/04/2022, 12:00 PM

## 2022-08-04 NOTE — Progress Notes (Signed)
PROGRESS NOTE   Subjective/Complaints: No events overnight. No acute complaints, does endorse spasms at nighttime causing pain and waking up.  Vitals stable Last BM 7/18 - continent b/b  ROS- neg CP, SOB, N/V/D Objective:   No results found. No results for input(s): "WBC", "HGB", "HCT", "PLT" in the last 72 hours.  Recent Labs    08/03/22 0529  NA 139  K 3.2*  CL 109  CO2 21*  GLUCOSE 95  BUN 18  CREATININE 0.57  CALCIUM 8.1*    Intake/Output Summary (Last 24 hours) at 08/04/2022 1014 Last data filed at 08/04/2022 0915 Gross per 24 hour  Intake 600 ml  Output --  Net 600 ml        Physical Exam: Vital Signs Blood pressure 118/63, pulse 75, temperature 98.7 F (37.1 C), temperature source Oral, resp. rate 18, height 5\' 2"  (1.575 m), weight 69.6 kg, SpO2 97%.   General: No acute distress. Laying in bed.  Mood and affect are appropriate Heart: Regular rate and rhythm no rubs murmurs or extra sounds Lungs: Clear to auscultation, breathing unlabored, no rales or wheezes Abdomen: Positive bowel sounds, soft nontender to palpation, nondistended Extremities: No clubbing, cyanosis, or edema Skin: No evidence of breakdown, no evidence of rash Neurologic: Cranial nerves II through XII intact,  motor strength is 5/5 in left ,  3+/5 Right deltoid, bicep, tricep, grip,  4/5 RIght and 5/5 left hip flexor, knee extensors, ankle dorsiflexor and plantar flexor Increase tone MAS 2 RIght finger flexors - stable Sensory exam normal sensation to light touch  bilateral upper and lower extremities- except Right hand and wrist  No evidence of visual field cut Mild dysarthria , no word finding issues  Musculoskeletal: Full range of motion in all 4 extremities. No joint swelling   Assessment/Plan: 1. Functional deficits which require 3+ hours per day of interdisciplinary therapy in a comprehensive inpatient rehab  setting. Physiatrist is providing close team supervision and 24 hour management of active medical problems listed below. Physiatrist and rehab team continue to assess barriers to discharge/monitor patient progress toward functional and medical goals  Care Tool:  Bathing    Body parts bathed by patient: Right arm, Chest, Abdomen, Front perineal area, Right upper leg, Left upper leg, Face   Body parts bathed by helper: Buttocks, Left arm, Left lower leg, Right lower leg, Right arm     Bathing assist Assist Level: Moderate Assistance - Patient 50 - 74%     Upper Body Dressing/Undressing Upper body dressing   What is the patient wearing?: Pull over shirt    Upper body assist Assist Level: Moderate Assistance - Patient 50 - 74%    Lower Body Dressing/Undressing Lower body dressing      What is the patient wearing?: Pants, Underwear/pull up     Lower body assist Assist for lower body dressing: Total Assistance - Patient < 25%     Toileting Toileting Toileting Activity did not occur Press photographer and hygiene only): Refused  Toileting assist Assist for toileting: Total Assistance - Patient < 25% (simulated)     Transfers Chair/bed transfer  Transfers assist     Chair/bed transfer assist level:  Maximal Assistance - Patient 25 - 49%     Locomotion Ambulation   Ambulation assist   Ambulation activity did not occur: Safety/medical concerns          Walk 10 feet activity   Assist  Walk 10 feet activity did not occur: Safety/medical concerns        Walk 50 feet activity   Assist Walk 50 feet with 2 turns activity did not occur: Safety/medical concerns         Walk 150 feet activity   Assist Walk 150 feet activity did not occur: Safety/medical concerns         Walk 10 feet on uneven surface  activity   Assist Walk 10 feet on uneven surfaces activity did not occur: Safety/medical concerns         Wheelchair     Assist Is the  patient using a wheelchair?: Yes Type of Wheelchair: Manual    Wheelchair assist level: Dependent - Patient 0%      Wheelchair 50 feet with 2 turns activity    Assist        Assist Level: Dependent - Patient 0%   Wheelchair 150 feet activity     Assist      Assist Level: Dependent - Patient 0%   Blood pressure 118/63, pulse 75, temperature 98.7 F (37.1 C), temperature source Oral, resp. rate 18, height 5\' 2"  (1.575 m), weight 69.6 kg, SpO2 97%.  Medical Problem List and Plan: 1. 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  Antithrombotics: -DVT/anticoagulation:  Pharmaceutical: Lovenox             -antiplatelet therapy: Aspirin 81 mg daily and Plavix 75 mg daily x 90 days then aspirin alone 3. Pain Management: Tylenol as needed   - 7/20: schedule tylenol 1000 mg TID. Has PRN robaxin.   4. Mood/Behavior/Sleep: Provide emotional support             -antipsychotic agents: N/A 5. Neuropsych/cognition: This patient is capable of making decisions on her own behalf. 6. Skin/Wound Care: Routine skin checks 7. Fluids/Electrolytes/Nutrition: Routine in and outs with follow-up chemistries 8.  Permissive hypertension.  Monitor with increased mobility    08/04/2022    7:30 PM 08/04/2022   12:55 PM 08/04/2022    4:16 AM  Vitals with BMI  Systolic 139 110 528  Diastolic 68 89 63  Pulse 93 76 75   Normotensive off BP meds 7/19-20  9.  Hyperlipidemia.  Lipitor Lipid Panel     Component Value Date/Time   CHOL 184 07/28/2022 0523   TRIG 130 07/28/2022 0523   HDL 35 (L) 07/28/2022 0523   CHOLHDL 5.3  07/28/2022 0523   VLDL 26 07/28/2022 0523   LDLCALC 123 (H) 07/28/2022 0523    10.  History of alcohol tobacco use.  Counseling, nicoderm patch  11.  Overweight  BMI 28.47.  Dietary follow-up 12.  Medical noncompliance.  Counseling- doesn't like to take meds, esp HTN meds.   13.  Straining to have BM, will add colace    -  LBM 7/18 14.  Hypokalemia s/p supplementation recheck K + in am  - 3.2 7/19   - Add kdur 20 mg daily for 2 days, recheck Monday  LOS: 4 days A FACE TO FACE EVALUATION WAS PERFORMED  Angelina Sheriff 08/04/2022, 10:14 AM

## 2022-08-05 MED ORDER — ACETAMINOPHEN 500 MG PO TABS
1000.0000 mg | ORAL_TABLET | Freq: Three times a day (TID) | ORAL | Status: DC
  Administered 2022-08-05 – 2022-08-17 (×34): 1000 mg via ORAL
  Filled 2022-08-05 (×37): qty 2

## 2022-08-05 MED ORDER — POTASSIUM CHLORIDE CRYS ER 20 MEQ PO TBCR
20.0000 meq | EXTENDED_RELEASE_TABLET | Freq: Every day | ORAL | Status: AC
  Administered 2022-08-05 – 2022-08-06 (×2): 20 meq via ORAL
  Filled 2022-08-05 (×2): qty 1

## 2022-08-05 NOTE — Progress Notes (Signed)
Patient A&Ox4- No pain at time of assessment  PERRLA  Mucus membranes intact- missing teeth on upper and lower jaw S1. S2 clear  Apical pulse 64 Lung sounds clear and equal bilaterally Right sided weakness in upper extremity +2 pulses- radial, brachial, tibial and pedal  +3 carotid pulse No edema noted  Bowel sounds present in all quadrants  BM 07/19  Skin clean, dry and intact Cap refill <3 seconds   I agree with this student's documentation.

## 2022-08-05 NOTE — Progress Notes (Cosign Needed)
Patient A&Ox4- No pain at time of assessment  PERRLA  Mucus membranes intact- missing teeth on upper and lower jaw S1. S2 clear  Apical pulse 64 Lung sounds clear and equal bilaterally Right sided weakness in upper extremity +2 pulses- radial, brachial, tibial and pedal  +3 carotid pulse No edema noted  Bowel sounds present in all quadrants  BM 07/19  Skin clean, dry and intact Cap refill <3 seconds

## 2022-08-05 NOTE — Progress Notes (Signed)
Occupational Therapy Session Note  Patient Details  Name: Melissa Colon MRN: [id number] Date of Birth: 1960-10-15  Today's Date: 08/05/2022 OT Individual Time: 5621-3086 OT Individual Time Calculation (min): 70 min    Short Term Goals: Week 1:  OT Short Term Goal 1 (Week 1): Pt will complete sit<>stands in preparation for ADLs using LRAD mod A OT Short Term Goal 2 (Week 1): Pt will recall hemi dressing techniques with min verbal cues OT Short Term Goal 3 (Week 1): Pt will complete squat pivot transfers mod Ax1  Skilled Therapeutic Interventions/Progress Updates:    1:1 Pt in bed when arrived. Pt came to EOB with mod A with cues for sequencing and assistance for reciprocal scooting towards EOB. Pt with squat pivot to the w/c on the right side with mod A with max cues to maintain forward weight shift. Pt also with some tone keeping her right LE leg as we began to transfer- stopping and reorganizing body before continuing - pt slightly impulsive wanting to continued. Pt chose to bathe at sink level with focus on use of right hand as gross assist to diminshed with cues to use. Pt reports no diplopia today and untaped her glasses. Continued practice of hemi dressing techniques with cues. Pt able to don shirt with min A, thread pants with min A and total A for pulling them up. Pt able to come into standing at the sink with min A but pt with right knee hyperextension and difficulty shifting weight overtop (translating anterior) of LE - resulting in still standing with posterior pelvic tilt. Donned socks, ASO and shoes with max A. Pt washed hands with setup and assistance to clean under her nails for hygiene. Pt left sitting up in w/c with half lap tray and safety belt.   Therapy Documentation Precautions:  Precautions Precautions: Fall Precaution Comments: right UE and LE hemiparesis. Restrictions Weight Bearing Restrictions: No  Pain: Pain Assessment Pain Scale: 0-10 Pain Score: 0-No  pain  Therapy/Group: Individual Therapy  Roney Mans [facility] 08/05/2022, 11:17 AM

## 2022-08-05 NOTE — Progress Notes (Signed)
Physical Therapy Session Note  Patient Details  Name: Melissa Colon MRN: [id number] Date of Birth: 08-Sep-1960  Today's Date: 08/05/2022 PT Individual Time: 0865-7846 + 9629-5284 PT Individual Time Calculation (min): 58 min + 58 min  Short Term Goals: Week 1:  PT Short Term Goal 1 (Week 1): Pt will roll side to side w/ mod A. PT Short Term Goal 2 (Week 1): Pt will transfer sup to sit w/ mod A PT Short Term Goal 3 (Week 1): Pt will transfer sit to stand/SPT w/ mod A. PT Short Term Goal 4 (Week 1): PT will assess gait.  Skilled Therapeutic Interventions/Progress Updates:    SESSION 1:  Pt presents in room in [facility], agreeable to PT. Pt denies pain at this time. Session focused on pregait and gait training with various UE support to promote improved tolerance to upright as well as RLE muscle fiber recruitment for swing and stance phase. Pt completes sit<>stands with min assist in //bars and at hand rail in hallway, mod assist for sit<>stand to RW.  Pt transported from room to main gym dependently for time management and positioning in //bars. Pt presents with ASO already donned, trialed DF assist wrap for RLE to improve foot clearance with swing phase gait. Pt completes pregait training in //bars with BUE support on //bars (assist for placement of RUE on //bars and able to maintain RUE grip on //bars 75% of the time) including: - Marching BLE 2x10 (demo flexor tone on RLE for marching and requires mod assist for blocking R knee in during LLE march) - step forward to target 2x10 BLE (RLE pt requires verbal cues for marching foot up, occasionally over shoots with stepping back to neutral and adductor tone noted, mod assist for blocking R knee with L step forward)  Pt ambulates length of //bars with mod assist and 2nd person WC follow, mod assist for blocking R knee in stance verbal cues and min assist to prevent scissoring with RLE advance, verbal cues for advancing RUE on //bars with pt requiring  increased time however able to manage, min/mod assist for postural stability.  Pt trial RW due to good RUE grip maintenance on //bars however takes 2 steps, pt demonstrating significant R lateral lean and lifting up RW on L side. Trialed twice with R hemi grip and without with same results.  Pt then transported to hallway and ambulates 10' with LUE support on hallway handrail, min/mod assist for RLE advance phase, min/mod assist for blocking R knee in stance phase. Min/mod assist for postural stability and 2nd person WC follow.  Pt transported back to room and set up for lunch. Pt lunch plate requires cutting up, while therapist cuts up food education provided to pt about needing assist for cutting food right now due to insufficient use of RUE and calling for help if needed with pt verbalizing understanding. Pt remains seated in WC with all needs within reach, cal light in place and chair alarm donned and activated at end of session.    SESSION 2: Pt presents in room with family present, agreeable to PT. Pt denies pain. Session focused on NMR for BUE/BLE muscle fiber recruitment and coordination for reciprocal movements as well as postural stability and muscle engagement in standing.   Pt transported from room to dayroom and positioned at [employer]. Pt completes stand pivot transfer mod assist to L WC to Nustep, reaches for opposing armrest with LUE. Pt completes continuous training with BUE/BLE (RUE ace wrapped to handle to assist  with grip) for total 10 minutes (brief breaks throughout to adjust UE handles or feet for comfort and improved body mechanics). Pt requires max verbal cues for maintaining neutral R knee/hip alignment as pt demonstrates tendency for R hip ER and abduction able to correct with cues. Pt completed activity as NMR to promote BUE/BLE coordination with reciprocal movements in closed chain. Following activity pt complete stand step transfer to R with mod assist therapist blocking R  knee.  Therapist assess pt for different WC fit as pt demonstrating posterior pelvic tilt during session however unable to find appropriate fitting WC. Pt repositioned during session to promote improved posture and pillows provided to reduce posterior pelvic tilt in WC. Pt provided with education for positioning and posture while seated in WC with pt verbalizing understanding.  Pt then completes standing balance training with mirror positioned in front of pt to provide visual cues to correct for R lateral lean in standing. Pt able to stand x5 trials increasing time in upright (15-30 seconds) with each trial however requires mod/max assist for anterior wt shift and terminal hip extension as well as blocking R knee. Pt completes partial squats x5 LUE support on WC armrest therapist blocking R knee for gluteal muscle fiber recruitment and promote improved terminal hip extension in standing.  Pt returns to room and remains seated in [facility] with all needs within reach, cal light in place and chair alarm donned and activated at end of session.   Therapy Documentation Precautions:  Precautions Precautions: Fall Precaution Comments: right UE and LE hemiparesis. Restrictions Weight Bearing Restrictions: No General:      Therapy/Group: Individual Therapy  Edwin Cap PT, DPT 08/05/2022, 12:23 PM

## 2022-08-05 NOTE — Progress Notes (Signed)
PROGRESS NOTE   Subjective/Complaints: No events overnight. No acute complaints. Vitals stable Last BM 7/19 - continent b/b  ROS- neg CP, SOB, N/V/D Objective:   No results found. No results for input(s): "WBC", "HGB", "HCT", "PLT" in the last 72 hours.  Recent Labs    08/03/22 0529  NA 139  K 3.2*  CL 109  CO2 21*  GLUCOSE 95  BUN 18  CREATININE 0.57  CALCIUM 8.1*    Intake/Output Summary (Last 24 hours) at 08/05/2022 2301 Last data filed at 08/05/2022 1751 Gross per 24 hour  Intake 820 ml  Output --  Net 820 ml        Physical Exam: Vital Signs Blood pressure (!) 123/58, pulse 82, temperature 98 F (36.7 C), temperature source Oral, resp. rate 18, height 5\' 2"  (1.575 m), weight 69.6 kg, SpO2 96%.   General: No acute distress. Laying in bed, elevated, sitting.  Mood and affect are appropriate Heart: Regular rate and rhythm no rubs murmurs or extra sounds Lungs: Clear to auscultation, breathing unlabored, no rales or wheezes Abdomen: Positive bowel sounds, soft nontender to palpation, nondistended Extremities: No clubbing, cyanosis, or edema Skin: No evidence of breakdown, no evidence of rash Neurologic: Cranial nerves II through XII intact,  Moving all 4 extremities antigravity and against resistance in bed  Prior exams: motor strength is 5/5 in left ,  3+/5 Right deltoid, bicep, tricep, grip,  4/5 RIght and 5/5 left hip flexor, knee extensors, ankle dorsiflexor and plantar flexor Increase tone MAS 2 RIght finger flexors - stable Sensory exam normal sensation to light touch  bilateral upper and lower extremities- except Right hand and wrist  No evidence of visual field cut Mild dysarthria , no word finding issues  Musculoskeletal: Full range of motion in all 4 extremities. No joint swelling   Assessment/Plan: 1. Functional deficits which require 3+ hours per day of interdisciplinary therapy in a  comprehensive inpatient rehab setting. Physiatrist is providing close team supervision and 24 hour management of active medical problems listed below. Physiatrist and rehab team continue to assess barriers to discharge/monitor patient progress toward functional and medical goals  Care Tool:  Bathing    Body parts bathed by patient: Right arm, Chest, Abdomen, Front perineal area, Right upper leg, Left upper leg, Face   Body parts bathed by helper: Buttocks, Left arm, Left lower leg, Right lower leg, Right arm     Bathing assist Assist Level: Moderate Assistance - Patient 50 - 74%     Upper Body Dressing/Undressing Upper body dressing   What is the patient wearing?: Pull over shirt    Upper body assist Assist Level: Moderate Assistance - Patient 50 - 74%    Lower Body Dressing/Undressing Lower body dressing      What is the patient wearing?: Pants, Underwear/pull up     Lower body assist Assist for lower body dressing: Total Assistance - Patient < 25%     Toileting Toileting Toileting Activity did not occur Press photographer and hygiene only): Refused  Toileting assist Assist for toileting: Total Assistance - Patient < 25% (simulated)     Transfers Chair/bed transfer  Transfers assist  Chair/bed transfer assist level: Moderate Assistance - Patient 50 - 74%     Locomotion Ambulation   Ambulation assist   Ambulation activity did not occur: Safety/medical concerns          Walk 10 feet activity   Assist  Walk 10 feet activity did not occur: Safety/medical concerns        Walk 50 feet activity   Assist Walk 50 feet with 2 turns activity did not occur: Safety/medical concerns         Walk 150 feet activity   Assist Walk 150 feet activity did not occur: Safety/medical concerns         Walk 10 feet on uneven surface  activity   Assist Walk 10 feet on uneven surfaces activity did not occur: Safety/medical concerns          Wheelchair     Assist Is the patient using a wheelchair?: Yes Type of Wheelchair: Manual    Wheelchair assist level: Dependent - Patient 0%      Wheelchair 50 feet with 2 turns activity    Assist        Assist Level: Dependent - Patient 0%   Wheelchair 150 feet activity     Assist      Assist Level: Dependent - Patient 0%   Blood pressure (!) 123/58, pulse 82, temperature 98 F (36.7 C), temperature source Oral, resp. rate 18, height 5\' 2"  (1.575 m), weight 69.6 kg, SpO2 96%.  Medical Problem List and Plan: 1. 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  Antithrombotics: -DVT/anticoagulation:  Pharmaceutical: Lovenox             -antiplatelet therapy: Aspirin 81 mg daily and Plavix 75 mg daily x 90 days then aspirin alone 3. Pain Management: Tylenol as needed   - 7/20: schedule tylenol 1000 mg TID. Has PRN robaxin.    - 7/21: no c/o pain; monitor  4. Mood/Behavior/Sleep: Provide emotional support             -antipsychotic agents: N/A 5. Neuropsych/cognition: This patient is capable of making decisions on her own behalf. 6. Skin/Wound Care: Routine skin checks 7. Fluids/Electrolytes/Nutrition: Routine in and outs with follow-up chemistries 8.  Permissive hypertension.  Monitor with increased mobility    08/05/2022    7:13 PM 08/05/2022   12:46 PM 08/05/2022    4:41 AM  Vitals with BMI  Systolic 123 135 409  Diastolic 58 67 61  Pulse 82 88 74   Normotensive off BP meds 7/19-21  9.  Hyperlipidemia.  Lipitor Lipid Panel     Component Value Date/Time   CHOL 184 07/28/2022 0523   TRIG  130 07/28/2022 0523   HDL 35 (L) 07/28/2022 0523   CHOLHDL 5.3 07/28/2022 0523   VLDL 26 07/28/2022 0523   LDLCALC 123 (H) 07/28/2022 0523    10.  History of alcohol tobacco use.  Counseling, nicoderm patch  11.  Overweight  BMI 28.47.  Dietary follow-up 12.  Medical noncompliance.  Counseling- doesn't like to take meds, esp HTN meds.  13.  Straining to have BM, will add colace    - LBM 7/19  14.  Hypokalemia s/p supplementation recheck K + in am  - 3.2 7/19   - Add kdur 20 mg daily for 2 days, recheck Monday  LOS: 5 days A FACE TO FACE EVALUATION WAS PERFORMED  Angelina Sheriff 08/05/2022, 11:01 PM

## 2022-08-06 DIAGNOSIS — E876 Hypokalemia: Secondary | ICD-10-CM

## 2022-08-06 DIAGNOSIS — I10 Essential (primary) hypertension: Secondary | ICD-10-CM

## 2022-08-06 DIAGNOSIS — D649 Anemia, unspecified: Secondary | ICD-10-CM

## 2022-08-06 DIAGNOSIS — K5900 Constipation, unspecified: Secondary | ICD-10-CM

## 2022-08-06 NOTE — Progress Notes (Signed)
Physical Therapy Session Note  Patient Details  Name: Melissa Colon MRN: [id number] Date of Birth: 1960-02-04  Today's Date: 08/06/2022 PT Individual Time: 1100-1159 PT Individual Time Calculation (min): 59 min   Short Term Goals: Week 1:  PT Short Term Goal 1 (Week 1): Pt will roll side to side w/ mod A. PT Short Term Goal 2 (Week 1): Pt will transfer sup to sit w/ mod A PT Short Term Goal 3 (Week 1): Pt will transfer sit to stand/SPT w/ mod A. PT Short Term Goal 4 (Week 1): PT will assess gait.  Skilled Therapeutic Interventions/Progress Updates:      Pt seated in chair upon arrival. Pt agreeable to therapy. Pt denies any pain at rest but reports intermittent R knee pain with standing and gait.   Pt ambulated total of 35 feet with L UE support on handrail and +1 mod-max A to stabilize R LE during stance and advance R LE during swing in neutral hip postiion versus hip adduction, verbal and tactile cues provided for reciprocal gait, midline orientation, and trunk extension/glute activation.  Pt performed sit<>stand 3x5 with L UE support on handrail min-mod A, and therapist stabilizing R LE to prevent buckling, and providing R LE approximation and providing verbal and tactile cues to facilitate forward trunk lean and weight bearing through R LE as pt strongly favors L LE.   Pt stood with L UE support on handrail and mod A-max A while looking in mirror and performed lateral weight shfiting with therapist stabilizing R LE to prevent buckling, and providing verbal, visual and tactile cues to keep L LE stationary, pt demos tendency to favor L LE.   Pt seated edge of WC and reaching with L UE across body and forward to grab beach ball 2x10  with therapist providing appoximation to R elbow and R LE to increase weight bearing and weight shift to R side.   Pt performed stand pivot transfer x2 in each direction wheelchair<>hospital bed with L UE on bed rail of hospital bed, verbal cues provided  for technique, with min A to L and mod A to R.   Pt seated in WC at end of session with all needs within reach and seatbelt alarm on.   Therapy Documentation Precautions:  Precautions Precautions: Fall Precaution Comments: right UE and LE hemiparesis. Restrictions Weight Bearing Restrictions: No   Therapy/Group: Individual Therapy  [facility] Ambrose Finland, [employer], DPT  08/06/2022, 11:38 AM

## 2022-08-06 NOTE — Progress Notes (Signed)
Occupational Therapy Session Note  Patient Details  Name: Melissa Colon MRN: [id number] Date of Birth: 10-09-1960  Today's Date: 08/06/2022 OT Individual Time: 9629-5284 OT Individual Time Calculation (min): 73 min  Session 2 OT Individual Time: 1400-1450 OT Individual Time Calculation (min): 50 min  OT Individual Missed Time (min): 10 min (Pt's BP elevated and Pt reporting not feeling well; informed RN)   Short Term Goals: Week 1:  OT Short Term Goal 1 (Week 1): Pt will complete sit<>stands in preparation for ADLs using LRAD mod A OT Short Term Goal 2 (Week 1): Pt will recall hemi dressing techniques with min verbal cues OT Short Term Goal 3 (Week 1): Pt will complete squat pivot transfers mod Ax1  Skilled Therapeutic Interventions/Progress Updates:     Session 1:  Pt received sitting up in wc dressed and ready for the day with all BADL needs met. Pt presenting with flat affect, however receptive to skilled OT session reporting 0/10 pain- OT offering intermittent rest breaks, repositioning, and therapeutic support to optimize participation in therapy session. Pt slow to engage in therapy session and often reposing "no" in response to therapist, however with increased time and encouragement, Pt receptive to engaging in functional tasks.   Donned socks, R ASO, and shoes total A on Pt at beginning of session and transported Pt total A in wc to therapy gym for energy conservation and time management.   Engaged Pt in completing closed chain pushing/pulling and shoulder circumduction movements with cross body reaching and bicep flexion/extension incorporated into task to work on decreasing ataxic R UE movements and improved muscular activation/control. Pt instructed to place RUE on towel and push/pull towel away and created clock wise/counter clockwise circular motions with Pt able to complete 2x20 in each direction. Pt R UE fatiguing at end of task. Instructed Pt to utilize L UE as helper to  guide R UE and work on reciprocal arm movements. Pt requiring maximal effort and mod tactile cues for muscle activation/control. Graded up task to requiring increased control with Pt completing same movement patterns with RUE positioned on UE range. Pt requiring min A under forearm for maintain control and support learning functional movement patterns without compensatory shoulder elevation or overly pronating forearm.   Engaged Pt in grasp/release targeted reaching activity while seated at table. Pt instructed to utilize R UE to retrieve squigz attached to table and place them into bin presented at shoulder level with maintained grasp and forearm pronation incorporated into task to simulate functional BADL and IADL tasks. Pt requiring increased time and motivation to complete task with min dropping noted. Pt with ataxic R UE movements when extending elbow to place squigz into container d/t increased motor planning challenges and decreased muscle control. Pt attempting to utilize compensatory grasp/release pattern supinating forearm and releasing squigz into bucket vs pronating forearm and fulling extending digit requiring mod verbal cues to correct.   Worked on moving through functional movement patterns to support improved neuro learning with Pt instructed to grasp cylinder patton while bringing hand to mouth, hand to shoulder, and anterior reaching towards therapist hand. Pt able to complete 10 reps of each movement with increased time for rest breaks provided between reps. Pt requiring min A under forearm for motor control. Donned 0.75# wrist weight on R UE to provided increased proprioceptive feedback with pt then able to complete 10 more reps of each exercise with decreased ataxic movements and improved motor control/accuracy.   Transported Pt back to room total A  in wc. Pt was left resting in wc with call bell in reach, seat belt alarm on, and all needs met.   Session 2: Pt received sitting up in wc  presenting to be mildly discouraged upon OT arrival. Pt reporting she had a BM this AM, but feels the urge to have another one and was upset d/t needing help for toileting. OT provided therapeutic support, encouragement, and education on OT's role in assistance with toileting for BADL retraining with improved moral and increased receptiveness to OT session following. Pt reporting 0/10 pain- OT offering intermittent rest breaks, repositioning, and therapeutic support to optimize participation in therapy session. Utilized stedy to transport Pt to bathroom with Pt able to utilize compensatory strategy to maintain RUE positioned on stedy grab bar. Pt sit<>stand using stedy grab bar min A. Pt able to maintain balance throughout transportation to bathroom. Worked on Acupuncturist with Pt able to doff pants with increased time and min A for balance with RUE stabilized on grab bar by OT. Provided increased amount of time on toilet for Pt to have a BM, however no success at this time- continent void documented in flowsheets. Pt able to perform anterior peri-care with supervision seated on BSC placed over toilet. Pt stood with min A with BUEs supported on grab bar. Worked on simulated posterior peri-care in standing with Pt able to complete with min A for balance and maximal encouragement d/t Pt reporting fear of falling. Donned clean brief on Pt total A. Pt then began to report feeling dizzy, nauseated, and that she was having double vision. Guided Pt to return to sitting and obtained biodex machine to assess vitals. Vitals sitting BP 139/73(80) HR 110 PSO2 99%. Provided increased amount of time to rest and all symptoms to recover. Provided education on both orthostatic hypotension and vertigo/vestibular system as both could possibly be causing dizziness symptoms. Pt also stating she had not drank any water today. Provided pt water with pt the able to drink 8 oz with increased therapeutic support. Pt reporting decrease  in dizziness symptoms and receptive to standing to bring pants to waist. Pt able to bring over R hip requiring assistance to fully bring pants over L hip. Dizziness and nausea symptoms onset again in standing with Pt instructed to returning to sitting on stedy. Vitals assessed: BP 147/132(138) HR 112 PSO2 99%. Pt's BP noted to be elevated. Transported Pt out of bathroom and assisted Pt back to bed. Pt upset d/t not being able to "work out" during therapy session with OT providing increased therapeutic support, encouragement, and education with improvement in moral following. Informed RN of Pt's status with RN entering room at end of session. Pt was left resting in bed with call bell in reach, bed alarm on, and all needs met. Missed 10 minutes of skilled OT treatment. Will attempt to make up time as able.    Therapy Documentation Precautions:  Precautions Precautions: Fall Precaution Comments: right UE and LE hemiparesis. Restrictions Weight Bearing Restrictions: No   Therapy/Group: Individual Therapy  Army Fossa 08/06/2022, 7:54 AM

## 2022-08-06 NOTE — Progress Notes (Signed)
During evening medication administration patient asking about medication to alleviate constipation issues. Explained has PRN medication Miralax. Initially patient was in agreement to take medication but changed her mind. Reason for not taking medication this evening concern of having an episode of incontinence over night and side effects of medication. Attempted to offer reassurance that staff would attempt to get her to the bathroom if calls for assistance and tried to set time for patient wanting to void before the night became later after taking medication. However, continued to refuse medication. With side effects expressed concerns that could affect her renal system and talked about her family having medical issues with their renal system as well as endocrine system.  Patient explains does not drink any water possibly few sips a day mainly soda throughout the day. Tried to encourage patient to set a goal of at least one cup of water through the day, but refusing to achieve that goal. Says that she obtains water through soda and tea. Endorses that she likes her drinks to be "sweet". Explained about how water alleviates constipation and helps move the bowels. Talked about water enhancers and adding flavor to water, which patient was receptive to.

## 2022-08-06 NOTE — Progress Notes (Signed)
Orthopedic Tech Progress Note Patient Details:  Rubye Strohmeyer 07-29-1960 [id number]  Called in order to [employer] for a RESTING WHO HAND SPLINT    Patient ID: Kristol Almanzar, female   DOB: 02-19-1960, 62 y.o.   MRN: [id number]  Donald Pore 08/06/2022, 12:21 PM

## 2022-08-06 NOTE — Progress Notes (Addendum)
PROGRESS NOTE   Subjective/Complaints: Patient reports issues with constipation although this has improved with bowel movement today.  She does not want any laxative medications added at this time.  Denies pain.  Anxious to get home.  No additional concerns.  ROS- neg for fevers, abdominal pain CP, SOB, N/V/D +constipation  Objective:   No results found. No results for input(s): "WBC", "HGB", "HCT", "PLT" in the last 72 hours.  No results for input(s): "NA", "K", "CL", "CO2", "GLUCOSE", "BUN", "CREATININE", "CALCIUM" in the last 72 hours.   Intake/Output Summary (Last 24 hours) at 08/06/2022 1213 Last data filed at 08/06/2022 0730 Gross per 24 hour  Intake 480 ml  Output --  Net 480 ml        Physical Exam: Vital Signs Blood pressure 112/64, pulse 72, temperature 98.1 F (36.7 C), resp. rate 18, height 5\' 2"  (1.575 m), weight 69.6 kg, SpO2 96%.   General: No acute distress.  Mood and affect are appropriate Heart: Regular rate and rhythm no rubs murmurs or extra sounds Lungs: Clear to auscultation, breathing unlabored, no rales or wheezes Abdomen: Positive bowel sounds, soft nontender to palpation, nondistended Extremities: No clubbing, cyanosis, or edema Skin: warm and dry Neurologic: Cranial nerves II through XII grossly intact,  Moving all 4 extremities antigravity and against resistance  Mild dysarthria   Prior exams: motor strength is 5/5 in left ,  3+/5 Right deltoid, bicep, tricep, grip,  4/5 RIght and 5/5 left hip flexor, knee extensors, ankle dorsiflexor and plantar flexor Increase tone MAS 2 RIght finger flexors - stable Sensory exam normal sensation to light touch  bilateral upper and lower extremities- except Right hand and wrist  No evidence of visual field cut Mild dysarthria , no word finding issues  Musculoskeletal: Full range of motion in all 4 extremities. No joint  swelling   Assessment/Plan: 1. Functional deficits which require 3+ hours per day of interdisciplinary therapy in a comprehensive inpatient rehab setting. Physiatrist is providing close team supervision and 24 hour management of active medical problems listed below. Physiatrist and rehab team continue to assess barriers to discharge/monitor patient progress toward functional and medical goals  Care Tool:  Bathing    Body parts bathed by patient: Right arm, Chest, Abdomen, Front perineal area, Right upper leg, Left upper leg, Face   Body parts bathed by helper: Buttocks, Left arm, Left lower leg, Right lower leg, Right arm     Bathing assist Assist Level: Moderate Assistance - Patient 50 - 74%     Upper Body Dressing/Undressing Upper body dressing   What is the patient wearing?: Pull over shirt    Upper body assist Assist Level: Moderate Assistance - Patient 50 - 74%    Lower Body Dressing/Undressing Lower body dressing      What is the patient wearing?: Pants, Underwear/pull up     Lower body assist Assist for lower body dressing: Total Assistance - Patient < 25%     Toileting Toileting Toileting Activity did not occur Press photographer and hygiene only): Refused  Toileting assist Assist for toileting: Total Assistance - Patient < 25% (simulated)     Transfers Chair/bed transfer  Transfers assist  Chair/bed transfer assist level: Moderate Assistance - Patient 50 - 74%     Locomotion Ambulation   Ambulation assist   Ambulation activity did not occur: Safety/medical concerns          Walk 10 feet activity   Assist  Walk 10 feet activity did not occur: Safety/medical concerns        Walk 50 feet activity   Assist Walk 50 feet with 2 turns activity did not occur: Safety/medical concerns         Walk 150 feet activity   Assist Walk 150 feet activity did not occur: Safety/medical concerns         Walk 10 feet on uneven surface   activity   Assist Walk 10 feet on uneven surfaces activity did not occur: Safety/medical concerns         Wheelchair     Assist Is the patient using a wheelchair?: Yes Type of Wheelchair: Manual    Wheelchair assist level: Dependent - Patient 0%      Wheelchair 50 feet with 2 turns activity    Assist        Assist Level: Dependent - Patient 0%   Wheelchair 150 feet activity     Assist      Assist Level: Dependent - Patient 0%   Blood pressure 112/64, pulse 72, temperature 98.1 F (36.7 C), resp. rate 18, height 5\' 2"  (1.575 m), weight 69.6 kg, SpO2 96%.  Medical Problem List and Plan: 1. 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  Antithrombotics: -DVT/anticoagulation:  Pharmaceutical: Lovenox             -antiplatelet therapy: Aspirin 81 mg daily and Plavix 75 mg daily x 90 days then aspirin alone 3. Pain Management: Tylenol as needed   - 7/20: schedule tylenol 1000 mg TID. Has PRN robaxin.    - 7/21-2: no c/o pain; monitor  4. Mood/Behavior/Sleep: Provide emotional support             -antipsychotic agents: N/A 5. Neuropsych/cognition: This patient is capable of making decisions on her own behalf. 6. Skin/Wound Care: Routine skin checks 7. Fluids/Electrolytes/Nutrition: Routine in and outs with follow-up chemistries 8.  Permissive hypertension.  Monitor with increased mobility    08/06/2022    4:25 AM 08/05/2022    7:13 PM 08/05/2022   12:46 PM  Vitals with BMI  Systolic 112 123 295  Diastolic 64 58 67  Pulse 72 82 88   7/22 Normotensive off  medications, continue to monitor BP  9.  Hyperlipidemia.  Lipitor Lipid Panel     Component Value Date/Time   CHOL 184 07/28/2022 0523   TRIG 130 07/28/2022 0523   HDL 35 (L) 07/28/2022 0523   CHOLHDL 5.3 07/28/2022 0523   VLDL 26 07/28/2022 0523   LDLCALC 123 (H) 07/28/2022 0523    10.  History of alcohol tobacco use.  Counseling, nicoderm patch  11.  Overweight  BMI 28.47.  Dietary follow-up 12.  Medical noncompliance.  Counseling- doesn't like to take meds,  esp HTN meds.   13.  Straining to have BM, will add colace    - LBM 7/22, she declines additional medications, continue with diet modifications, continue to monitor  14.  Hypokalemia s/p supplementation recheck K + in am  - 3.2 7/19   - Add kdur 20 mg daily for 2 days, recheck Monday  -7/22 Recheck tomorrow ordered 15. Anemia-mild  -7/22 recheck tomorrow  LOS: 6 days A FACE TO FACE EVALUATION WAS PERFORMED  Fanny Dance 08/06/2022, 12:13 PM

## 2022-08-07 ENCOUNTER — Inpatient Hospital Stay (HOSPITAL_COMMUNITY): Payer: Medicaid Other

## 2022-08-07 DIAGNOSIS — H532 Diplopia: Secondary | ICD-10-CM

## 2022-08-07 DIAGNOSIS — R42 Dizziness and giddiness: Secondary | ICD-10-CM

## 2022-08-07 LAB — BASIC METABOLIC PANEL
Anion gap: 7 (ref 5–15)
BUN: 16 mg/dL (ref 6–20)
CO2: 21 mmol/L — ABNORMAL LOW (ref 22–32)
Calcium: 8.6 mg/dL — ABNORMAL LOW (ref 8.9–10.3)
Chloride: 108 mmol/L (ref 98–111)
Creatinine, Ser: 0.6 mg/dL (ref 0.44–1.00)
GFR, Estimated: >60 mL/min (ref >60)
Glucose, Bld: 105 mg/dL — ABNORMAL HIGH (ref 70–99)
Potassium: 4.2 mmol/L (ref 3.5–5.1)
Sodium: 136 mmol/L (ref 135–145)

## 2022-08-07 LAB — CBC
HCT: 35.9 % — ABNORMAL LOW (ref 36.0–46.0)
Hemoglobin: 11.4 g/dL — ABNORMAL LOW (ref 12.0–15.0)
MCH: 27.9 pg (ref 26.0–34.0)
MCHC: 31.8 g/dL (ref 30.0–36.0)
MCV: 88 fL (ref 80.0–100.0)
Platelets: 335 10*3/uL (ref 150–400)
RBC: 4.08 MIL/uL (ref 3.87–5.11)
RDW: 13.2 % (ref 11.5–15.5)
WBC: 8.3 10*3/uL (ref 4.0–10.5)
nRBC: 0 % (ref 0.0–0.2)

## 2022-08-07 MED ORDER — MECLIZINE HCL 25 MG PO TABS: 25.0000 mg | ORAL_TABLET | Freq: Three times a day (TID) | ORAL | Status: DC | PRN

## 2022-08-07 MED ORDER — SENNOSIDES-DOCUSATE SODIUM 8.6-50 MG PO TABS
1.0000 | ORAL_TABLET | Freq: Every day | ORAL | Status: DC
  Administered 2022-08-07 – 2022-08-16 (×9): 1 via ORAL
  Filled 2022-08-07 (×10): qty 1

## 2022-08-07 NOTE — Progress Notes (Addendum)
PROGRESS NOTE   Subjective/Complaints: Patient had a bowel movement this morning however reports that it was hard.  She continues to have intermittent double vision and dizziness.  ROS- neg for fevers, abdominal pain CP, SOB, N/V/D +constipation, double vision- reports intermittent since CVA  Objective:   No results found. Recent Labs    08/07/22 0705  WBC 8.3  HGB 11.4*  HCT 35.9*  PLT 335    No results for input(s): "NA", "K", "CL", "CO2", "GLUCOSE", "BUN", "CREATININE", "CALCIUM" in the last 72 hours.   Intake/Output Summary (Last 24 hours) at 08/07/2022 0828 Last data filed at 08/06/2022 1309 Gross per 24 hour  Intake 237 ml  Output --  Net 237 ml        Physical Exam: Vital Signs Blood pressure (!) 127/59, pulse 75, temperature 97.6 F (36.4 C), resp. rate 17, height 5\' 2"  (1.575 m), weight 69.6 kg, SpO2 96%.   General: No acute distress.  Mood and affect are appropriate Heart: Regular rate and rhythm no rubs murmurs or extra sounds Lungs: Clear to auscultation, breathing unlabored, no rales or wheezes Abdomen: Positive bowel sounds, soft nontender to palpation, nondistended Extremities: No clubbing, cyanosis, or edema Skin: warm and dry Neurologic: Cranial nerves II through XII grossly intact, delayed responses Moving all 4 extremities antigravity and against resistance  Mild dysarthria  Mood is a little flat Disconjugate gaze noted Prior exams: motor strength is 5/5 in left ,  3+/5 Right deltoid, bicep, tricep, grip,  4/5 RIght and 5/5 left hip flexor, knee extensors, ankle dorsiflexor and plantar flexor Increase tone MAS 2 RIght finger flexors - stable Sensory exam normal sensation to light touch  bilateral upper and lower extremities- except Right hand and wrist  No evidence of visual field cut Mild dysarthria , no word finding issues  Musculoskeletal: Full range of motion in all 4 extremities.  No joint swelling   Assessment/Plan: 1. Functional deficits which require 3+ hours per day of interdisciplinary therapy in a comprehensive inpatient rehab setting. Physiatrist is providing close team supervision and 24 hour management of active medical problems listed below. Physiatrist and rehab team continue to assess barriers to discharge/monitor patient progress toward functional and medical goals  Care Tool:  Bathing    Body parts bathed by patient: Right arm, Chest, Abdomen, Front perineal area, Right upper leg, Left upper leg, Face   Body parts bathed by helper: Buttocks, Left arm, Left lower leg, Right lower leg, Right arm     Bathing assist Assist Level: Moderate Assistance - Patient 50 - 74%     Upper Body Dressing/Undressing Upper body dressing   What is the patient wearing?: Pull over shirt    Upper body assist Assist Level: Moderate Assistance - Patient 50 - 74%    Lower Body Dressing/Undressing Lower body dressing      What is the patient wearing?: Pants, Underwear/pull up     Lower body assist Assist for lower body dressing: Total Assistance - Patient < 25%     Toileting Toileting Toileting Activity did not occur Press photographer and hygiene only): Refused  Toileting assist Assist for toileting: Total Assistance - Patient < 25% (simulated)  Transfers Chair/bed transfer  Transfers assist  Chair/bed transfer activity did not occur: Safety/medical concerns  Chair/bed transfer assist level: Moderate Assistance - Patient 50 - 74%     Locomotion Ambulation   Ambulation assist   Ambulation activity did not occur: Safety/medical concerns          Walk 10 feet activity   Assist  Walk 10 feet activity did not occur: Safety/medical concerns        Walk 50 feet activity   Assist Walk 50 feet with 2 turns activity did not occur: Safety/medical concerns         Walk 150 feet activity   Assist Walk 150 feet activity did not  occur: Safety/medical concerns         Walk 10 feet on uneven surface  activity   Assist Walk 10 feet on uneven surfaces activity did not occur: Safety/medical concerns         Wheelchair     Assist Is the patient using a wheelchair?: Yes Type of Wheelchair: Manual    Wheelchair assist level: Dependent - Patient 0%      Wheelchair 50 feet with 2 turns activity    Assist        Assist Level: Dependent - Patient 0%   Wheelchair 150 feet activity     Assist      Assist Level: Dependent - Patient 0%   Blood pressure (!) 127/59, pulse 75, temperature 97.6 F (36.4 C), resp. rate 17, height 5\' 2"  (1.575 m), weight 69.6 kg, SpO2 96%.  Medical Problem List and Plan: 1. 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  Antithrombotics: -DVT/anticoagulation:  Pharmaceutical: Lovenox             -antiplatelet therapy: Aspirin 81 mg daily and Plavix 75 mg daily x 90 days then aspirin alone 3. Pain Management: Tylenol as needed   - 7/20: schedule tylenol 1000 mg TID. Has PRN robaxin.    - 7/21-2: no c/o pain; monitor  4. Mood/Behavior/Sleep: Provide emotional support             -antipsychotic agents: N/A 5. Neuropsych/cognition: This patient is capable of making decisions on her own behalf. 6. Skin/Wound Care: Routine skin checks 7. Fluids/Electrolytes/Nutrition: Routine in and outs with follow-up chemistries 8.  Permissive hypertension.  Monitor with increased mobility    08/07/2022    5:48 AM 08/06/2022    7:53 PM 08/06/2022    1:18 PM  Vitals with BMI  Systolic 127  126 130  Diastolic 59 69 66  Pulse 75 76 89   7/22-3 Normotensive off medications, continue to monitor BP  9.  Hyperlipidemia.  Lipitor Lipid Panel     Component Value Date/Time   CHOL 184 07/28/2022 0523   TRIG 130 07/28/2022 0523   HDL 35 (L) 07/28/2022 0523   CHOLHDL 5.3 07/28/2022 0523   VLDL 26 07/28/2022 0523   LDLCALC 123 (H) 07/28/2022 0523    10.  History of alcohol tobacco use.  Counseling, nicoderm patch  11.  Overweight  BMI 28.47.  Dietary follow-up 12.  Medical noncompliance.  Counseling- doesn't like to take meds, esp HTN meds.   13.  Straining to have BM, will add colace    - LBM 7/23, pt reports still feeling constipated, will add senokot  14.  Hypokalemia s/p supplementation recheck K + in am  - 3.2 7/19   - Add kdur 20 mg daily for 2 days, recheck Monday  -7/23 K+ stable at 4.2 15. Anemia-mild  -7/23 HGB 11.4 this AM, a little decreased from prior, recheck Thursday, check occult blood -stool, recheck CBC later in the week 16. Dizziness/double vision intermittent. Pt reports this has been going on since CVA  -Can try occlusion, will order meclizine 25mg  TID PRN  -7/23 discussed with therapy.  Patient had double vision last week that improved Friday but has returned today.  It appears she did have disconjugate gaze documented on her H&P.  He has also been noting some left visual field deficits.  Discussed with neurology Dr. Guss Bunde order MRI for further evaluation  LOS: 7 days A FACE TO FACE EVALUATION WAS PERFORMED  Fanny Dance 08/07/2022, 8:28 AM

## 2022-08-07 NOTE — Plan of Care (Signed)
  Problem: Consults Goal: RH STROKE PATIENT EDUCATION Description: See Patient Education module for education specifics  Outcome: Progressing   Problem: RH SAFETY Goal: RH STG ADHERE TO SAFETY PRECAUTIONS W/ASSISTANCE/DEVICE Description: STG Adhere to Safety Precautions With cues Assistance/Device. Outcome: Progressing   Problem: RH KNOWLEDGE DEFICIT Goal: RH STG INCREASE KNOWLEDGE OF HYPERTENSION Description: Patient and family will be able to manage HTN with medications and dietary modifications using educational resources independently Outcome: Progressing Goal: RH STG INCREASE KNOWLEGDE OF HYPERLIPIDEMIA Description: Patient and family will be able to manage HLD with medications and dietary modifications using educational resources independently Outcome: Progressing Goal: RH STG INCREASE KNOWLEDGE OF STROKE PROPHYLAXIS Description: Patient and family will be able to manage secondary risks with medications and dietary modifications using educational resources independently Outcome: Progressing   Problem: RH Vision Goal: RH LTG Vision (Specify) Outcome: Progressing

## 2022-08-07 NOTE — Progress Notes (Signed)
Patient ID: Melissa Colon, female   DOB: 1960/09/23, 62 y.o.   MRN: [id number]  Patient will require a ramp at d/c.

## 2022-08-07 NOTE — Progress Notes (Signed)
Physical Therapy Session Note  Patient Details  Name: Melissa Colon MRN: [id number] Date of Birth: 1960-10-24  Today's Date: 08/07/2022 PT Individual Time: 3244-0102 and 7253-6644 PT Individual Time Calculation (min): 14 min and 62 min and  Today's Date: 08/07/2022 PT Missed Time: 46 Minutes Missed Time Reason: CT/MRI  Short Term Goals: Week 1:  PT Short Term Goal 1 (Week 1): Pt will roll side to side w/ mod A. PT Short Term Goal 2 (Week 1): Pt will transfer sup to sit w/ mod A PT Short Term Goal 3 (Week 1): Pt will transfer sit to stand/SPT w/ mod A. PT Short Term Goal 4 (Week 1): PT will assess gait.  Skilled Therapeutic Interventions/Progress Updates:    Session 1: Per MD, no hold on therapies despite order for MRI, unless pt's status changes.  Pt received sitting in w/c reporting she is waiting to be taken for an MRI. Pt agreeable to participate in therapy session until then.  Discussed PLOF and D/C plan with pt reporting the following:  - pt states she was at wheelchair level baseline and that she received a wheelchair after her hospitalization in 2021 - reports she was unable to self-propel her wheelchair at home due to the thick carpet, requiring dependent transport - reports she lives at home with her husband, son, brother-in-law, and niece with her 26y.o. son, Melissa Colon, being her primary caregiver (states he does not work outside of caring for her) - states she has 4 STE house and that she was unable to leave the house without an ambulance transport PTA - will ask SW to provide information on ramps as pt needs safe access in/out of her home in event of emergency  Nurse and transport services arrived to take pt for MRI.  Pt already wearing R LE ASO to protect her ankle during transfers. Pt states she does not like the ASO because it is too cumbersome to don/doff - will consider AFO consultation as pt needs ankle support for safe transfers to avoid injury.  L partial stand pivot  transfer w/c>EOB using L UE support on bedrail with heavy mod assist and pt having strong fear of falling in the middle of the transfer due to feeling like her R LE was not going to support her, despite therapist blocking it, because of this pt starts pushing towards the R, away from the bed - therapist cued her to use her L UE to pull her hips towards bed on the L and pt was able to sit safely on the bed with mod assist.  Sit>supine using L UE support on bedrail with min assist for R hemibody management. Doffed R LE ASO. Pt left supine in bed with needs in reach and in the care of nurse and transport services. Missed 46 minutes of skilled physical therapy.    Session 2: Per RN, pt cleared to continue participating in therapy sessions at this time, still awaiting MRI results.  Pt received supine in bed awake and agreeable to therapy session with min encouragement due to reports of fatigue from going down for imaging.  Supine>sitting L EOB, HOB partially elevated and using bedrail, with increased time/effort for R hemibody management. Sitting EOB, donned shoes total assist for time management. R squat pivot transfers EOB>W/C using L UE support on bedrail and mod assist for lifting/pivoting hips while blocking R knee to promote WBing and facilitate increased hip clearance.  Pt continues to require increased time to process and verbally respond to therapist's questions.  Discussed the following with pt:  - pt reports she sleeps on sofa at home and that she is interested in receiving a hospital bed (states they tried to get her one last time but that it didn't work out; however, she cannot remember why) pt will need a full electric bed with 4 bedrails - recommendation for her to receive a custom molded AFO to protect her R ankle from rolling due to lack of proprioception and sensation placing her at high risk for rolling her ankle, despite her having active ankle DF/PF movement. Also, pt reports hitting  the back of her R heel against items at times causing pain so this type of AFO would also provide protection to her foot. - showed pt an example of custom molded PLS AFO and pt reports liking it much better than the ASO because she feels the ASO is too hard to don/doff due to the laces and velcro (also due to pt's impaired memory, she is at risk for placing it on improperly) - performed chart review and discovered that pt completed custom manual wheelchair evaluation with [facility] during prior CIR stay - email sent to reach back out to follow-up on this due to pt reporting earlier she was unable to self-propel in the house due to thick carpet - pt reports she is interested in a power wheelchair; however, don't feel this would be safe due to pt's delayed processing - tried to explain this to pt; however, she may require a trial to experience it herself  Transported to/from gym in w/c for time management and energy conservation.  Therapist provided softer strap on right RW hand orthosis.   Sit>stand w/c>RW with min assist - pt with good recall of proper placement of R hand on orthosis (although due to sensory deficits, pt with difficulty determining if her hand is positioned correctly) and to push up with L hand from armrest without cuing - therapist guarding/blocking R knee for safety - minor posterior lean in standing. Pt reports increased fear of falling with any and all standing.  Donned R LE DF assist ACE wrap to protect her ankle.   L stand pivot w/c>EOM using RW and then performed L stand pivot back to w/c using RW to continue working on transfers towards her non-involved side initially due to pt having high fear of falling and feeling unsure of this transfer technique. Requires mod assist for balance and AD management while also guarding R knee for safety throughout - pt noticed to have breakdown in motor plan due to fear of falling requiring step-by-step cuing for sequencing, therapist also  provided visual demonstration of transfer sequence to improve her comfortableness with transfer, educated her on taking 1 small, step with each LE and then repositioning AD and repeating this until she is in front of the next seat. During 2nd transfer, pt tries to step backwards towards w/c using R LE and overshoots step length causing R foot to go back underneath the w/c partially - attempted to cue pt to bring it back forward; however, she becomes nervous and cannot motor plan, therefore just had pt sit back down in w/c seat for safety with heavy mod assist. Pt becomes tearful due to her fear of falling, therapist provides pt with emotional support and education on why this transfer was unsuccessful and pt able to recognize she must see where her R LE is positioned in order to move it correctly with pt calming with this knowledge.  Transported back to  her room and pt left seated in w/c with needs in reach, seat belt alarm on, and R UE supported on 1/2 lap tray.    Therapy Documentation Precautions:  Precautions Precautions: Fall Precaution Comments: right UE and LE hemiparesis. Restrictions Weight Bearing Restrictions: No   Pain:  Session 1: No reports of pain throughout session.  Session 2: Denies pain during session.    Therapy/Group: Individual Therapy  Ginny Forth , PT, DPT, NCS, CSRS 08/07/2022, 12:56 PM

## 2022-08-07 NOTE — Progress Notes (Signed)
Occupational Therapy Session Note  Patient Details  Name: Melissa Colon MRN: [id number] Date of Birth: December 29, 1960  Today's Date: 08/07/2022 OT Individual Time: 1000-1058 OT Individual Time Calculation (min): 58 min  OT Individual Time: 3086-5784 OT Individual Time Calculation (min): 28 min   Short Term Goals: Week 1:  OT Short Term Goal 1 (Week 1): Pt will complete sit<>stands in preparation for ADLs using LRAD mod A OT Short Term Goal 2 (Week 1): Pt will recall hemi dressing techniques with min verbal cues OT Short Term Goal 3 (Week 1): Pt will complete squat pivot transfers mod Ax1  Skilled Therapeutic Interventions/Progress Updates:     AM Session: Pt received sitting up in bed presenting to be mildly upset upon OT arrival. Pt receptive to skilled OT session reporting 0/10 pain- OT offering intermittent rest breaks, repositioning, and therapeutic support to optimize participation in therapy session. Greeted Pt and spent large amount of time at beginning of session providing therapeutic support with Pt reporting she is discouraged d/t the amount of assistance she currently requires for BADLs, functional transfers, and mobility and she is upset that her double vision has worsened. OT provided gentle education on CVA recovery, etiology, impact of function, and engaged Pt in light hearted conversation discussing positive supports and progress this far with moderate improvement in moral. Focused this session on conducting in-depth visual assessment and providing education on safety considerations, accommodations, and initiating education on visual motor exercises. Pt transitioned to EOB with min A using bed features. Donned socks, R ASO, and shoes total A for time management. Pt completed squat pivot transfer EOB to wc to L side with heavy mod A with moderate encouragement required d/t Pt reporting fear of falling. Conducted visual assessments with Pt sitting upright in wc. Assessment limited by  Pt's current cognitive deficits and decreased attention/awareness. Pt reporting double vision in both far and close gaze with glasses on with objects split vertically in far gaze and horizontally in close gaze per Pt report. Pt unable to see objects presented in L visual field unlit object arrives in midline with potential L superior/inferior visual field cut present. Pt also noted to have decreased smoothness of visual tracking in all directions. Assessed eye dominance with Pt noted to be Right eye dominant. Used transparent tape to occlude L lens of glasses with Pt reporting decreased double vision. Engaged pt in completing letter cancellation assessment in non-standardized way to work on using visual scanning techniques of light house scanning with Pt requiring mod verbal cues to utilize technique. Will continue to assess vision and teach compensatory strategies during Pt's hospital stay to support learning and carryover. Pt was left resting in wc with call bell in reach, seat belt alarm on, and all needs met. Informed medical team of Pt's increase in double vision symptoms and feeling "off" this session.   PM Session:  Pt received sitting up in wc dressing and ready for the day. Pt presenting to be in good spirits receptive to skilled OT session reporting 0/10 pain- OT offering intermittent rest breaks, repositioning, and therapeutic support to optimize participation in therapy session. Focused beginning of session on providing education on resting hand splint purpose and use with emphasis on performing skin checks with handout provided and placed above Pt's HOB. Pt receptive to education and receptive wearing at night with demonstration provided on how to don. Pt attempted to independently don resting hand splint, however demonstrated increased challenges positioning hand and manipulating Velroc fasteners. Pt able to "coach"  OT through process to donn to simulate education family or hospital staff. Remainder  of session focused on massed practice of sit<>stands and standing balance to increase Pt's independence in BADLs and functional transfers. Positioned Pt at sink in front of mirror to provided visual feedback of body positioning and alignment. Facilitated increased anterior weight shifting by instructing Pt to reach for sink prior to stand and providing tactile cues to facilitate weight shift prior to stand. Pt able to complete 4 sit<>stands using sink with heavy min A and OT blocking R knee to prevent buckling and facilitating weight bearing through R LE. Pt able to maintain standing balance at sink with B UE support up to 30 seconds with min A. Graded up task to using RW for balance with Pt able to complete 1 sit<>stand with heavy min A to power up and maintain balance. Pt presenting with decreased R side awareness during task and decreased weight shifting requiring mod tactile/verbal cues to facilitate. Pt also presenting with increased fear of falling becoming anxious in standing and requiring moderate therapeutic support throughout stand. Pt was left resting in wc with call bell in reach, seat belt alarm on, and all needs met.    Therapy Documentation Precautions:  Precautions Precautions: Fall Precaution Comments: right UE and LE hemiparesis. Restrictions Weight Bearing Restrictions: No   Therapy/Group: Individual Therapy  Army Fossa 08/07/2022, 7:56 AM

## 2022-08-08 ENCOUNTER — Inpatient Hospital Stay (HOSPITAL_COMMUNITY): Payer: Medicaid Other

## 2022-08-08 MED ORDER — DICLOFENAC SODIUM 1 % EX GEL
2.0000 g | Freq: Four times a day (QID) | CUTANEOUS | Status: DC
  Administered 2022-08-08 – 2022-08-16 (×23): 2 g via TOPICAL
  Filled 2022-08-08: qty 100

## 2022-08-08 NOTE — Progress Notes (Signed)
Occupational Therapy Session Note  Patient Details  Name: Melissa Colon MRN: [id number] Date of Birth: 03/17/1960  Today's Date: 08/08/2022 OT Individual Time: 1020-1130 OT Individual Time Calculation (min): 70 min    Short Term Goals: Week 1:  OT Short Term Goal 1 (Week 1): Pt will complete sit<>stands in preparation for ADLs using LRAD mod A OT Short Term Goal 2 (Week 1): Pt will recall hemi dressing techniques with min verbal cues OT Short Term Goal 3 (Week 1): Pt will complete squat pivot transfers mod Ax1  Skilled Therapeutic Interventions/Progress Updates:  Skilled OT intervention completed with focus on RUE NMR and positioning. Pt received seated in w/c, agreeable to session. Unrated pain reported in Rt shoulder on anterior aspect of deltoid; nurse in room to administer meds. OT offered rest breaks and repositioning throughout for pain reduction.  Nursing present to administer meds. OT looked at RUE resting hand splint per pt comment of it "making the hand go numb when it's on." Applied the splint to RUE total A, however wrist with neutral alignment and digits in proper position. Made slight adjustment to be a little more supinated, however OT advised pt that Rt shoulder positioning can also play a role in distal UE numbness so to be cognizant of position in bed of entire RUE as well.  Transported pt dependently in w/c <> gym for time. Seated at table top, pt completed 10 x 1 min intervals on arm bike to promote bilateral integration, RUE coordination/NMR and sustained grasping/control needed for independence with BADLs. When environmental conditions were distracting or therapist talked, pt had noted decrease in control with RUE with 2 instances of dropping hand completely on table requiring OT to guard the UE during the following intervals.  Pt stated pain in Rt shoulder and upon inspection, OT noted 1 finger width subluxation (anterior drift) which per chart review may be long  standing condition from prior CVA. Pt agreeable to trial k-taping. OT applied k-tape to Rt humerus, as well as Rt deltoid anchor to produce joint approximation needed to restore functional positioning of the RUE as well as reduce pain related symptoms from misalignment. Instructed pt on tape precautions and that if area becomes red/irritated to remove promptly and that k-tape is to be worn for 2-3 days at a time. Primary OT notified of application.   Pt utilized Rt hand to grasp/release big pegs and transfer <> pegboard, however pt with max difficulty with coordination. Back in room, pt remained seated in w/c 1/2 lap tray on for hemi positioning, with belt alarm on/activated, and with all needs in reach at end of session.   Therapy Documentation Precautions:  Precautions Precautions: Fall Precaution Comments: right UE and LE hemiparesis. Restrictions Weight Bearing Restrictions: No    Therapy/Group: Individual Therapy  Melvyn Novas, MS, OTR/L  08/08/2022, 12:23 PM

## 2022-08-08 NOTE — Progress Notes (Signed)
Physical Therapy Weekly Progress Note  Patient Details  Name: Melissa Colon MRN: [id number] Date of Birth: February 18, 1960  Beginning of progress report period: August 01, 2022 End of progress report period: August 08, 2022  Today's Date: 08/08/2022 PT Individual Time: 0920-1019  PT Individual Time Calculation (min): 59 min   Patient has met 3 of 4 short term goals. [name] is making slow, steady progress with therapy demonstrating increasing independence with functional mobility. She is performing supine<>sit using hospital bed features with CGA, sit<>stands using RW vs bedrail/grab bar with min/mod A, and squat pivot transfers to/from w/c using L UE support on stable surface such as w/c armrest, bedrail, or grab bar. Pt has not participated in gait training at this time due to high fear of falling and pt being a full time wheelchair user at baseline with focus on D/C planning and transfer training. Therapist addressing AFO consult to protect R ankle due to impaired sensation and wheelchair needs prior to D/C. Pt will require strict 24hr physical assistance at D/C and family needs hands-on education/training prior to D/C. Pt will benefit from continued CIR level skilled physical therapy prior to D/Cing home with family support.  Patient continues to demonstrate the following deficits muscle weakness, decreased cardiorespiratoy endurance, impaired timing and sequencing, unbalanced muscle activation, decreased coordination, and decreased motor planning, decreased visual motor skills,  , decreased initiation, decreased attention, decreased awareness, decreased problem solving, decreased safety awareness, decreased memory, and delayed processing, and decreased sitting balance, decreased standing balance, decreased postural control, hemiplegia, and decreased balance strategies and therefore will continue to benefit from skilled PT intervention to increase functional independence with mobility.  Patient  progressing toward long term goals..  Continue plan of care.  PT Short Term Goals Week 1:  PT Short Term Goal 1 (Week 1): Pt will roll side to side w/ mod A. PT Short Term Goal 1 - Progress (Week 1): Met PT Short Term Goal 2 (Week 1): Pt will transfer sup to sit w/ mod A PT Short Term Goal 2 - Progress (Week 1): Met PT Short Term Goal 3 (Week 1): Pt will transfer sit to stand/SPT w/ mod A. PT Short Term Goal 3 - Progress (Week 1): Met PT Short Term Goal 4 (Week 1): PT will assess gait. PT Short Term Goal 4 - Progress (Week 1): Progressing toward goal Week 2:  PT Short Term Goal 1 (Week 2): Pt will perform supine<>sit using bed features per home set-up with supervision consistently PT Short Term Goal 2 (Week 2): Pt will perform bed<>chair transfers using LRAD with no more than mod assist consistently PT Short Term Goal 3 (Week 2): Pt will perform sit<>stands using LRAD with no more than mod assist consistently PT Short Term Goal 4 (Week 2): Pt will perform L hemi-propulsion w/c mobility at least 29ft with supervision  Skilled Therapeutic Interventions/Progress Updates:  Ambulation/gait training;Community reintegration;Neuromuscular re-education;Stair training;UE/LE Strength taining/ROM;Discharge planning;Therapeutic Activities;UE/LE Coordination activities;Functional mobility training;Patient/family education;Balance/vestibular training;Cognitive remediation/compensation;Disease management/prevention;DME/adaptive equipment instruction;Psychosocial support;Wheelchair propulsion/positioning;Skin care/wound management;Pain management;Splinting/orthotics   Pt received awake, supine in bed and agreeable to therapy session. Pt continues to have delayed processing requiring increased time to communicate and initiate motor tasks. Pt continues to report double vision with variations/inconsistencies in presentation and pt having difficult time describing her experience - pt continues to intermittently use  glasses with L lens fully taped.  Pt reports she had her resting hand splint donned last night but then had to take it off because it made her  hand start "tingling" like it "was going to sleep." Therapist educated pt to ensure the straps are not too tight when she has assistance donning it.   Pt requesting for assistance learning how to call her family - therapist wrote pt's husband's phone number to dial out on a paper in her room.   MD in/out for morning assessment - neurology planning to follow-up. No restrictions to therapy at this time.  Supine>sitting L EOB, HOB slightly elevated and using bedrail, with supervision and increased time/effort to scoot hips forward towards EOB with light min A at end. Sitting EOB, donned tennis shoes and R LE ASO to protect her ankle from rolling due to impaired sensation/proprioception total assist.  Reinforced education on plan to perform R LE AFO consult tomorrow - pt will need larger shoes to allow fit of the brace, will ask SW to request family bring in at least a size 7 shoe. (Pt currently wearing size 6 & 1/2)  Reinforced education to pt on recommendation for her to receive a hospital bed for home use, electric with 4 bedrails if available, because pt will need to use a bedrail towards L HOB to perform bed mobility and transfers with increased independence.  Block practice squat pivot transfers w/c<>EOB x6 reps using L UE support on bedrail - transfers towards L w/c>EOB with varying heavy to lighter min A - pt does better when she stays in a true squat position rather than when she tries to come to standing because then it causes a break down in motor planning; benefits from therapist providing manual facilitation for WBing through R LE to keep it in proper position (due to lack of sensation pt will often move it in an incorrect position without awareness) - discovered when transferring towards R, back to w/c, pt really benefits the most from cuing to move  her head towards L hand while pivoting her hips towards w/c to encourage her to stay in a squat position and focus on head/hips relationship as opposed to standing up and then pivoting her hips; continues to require therapist to position her R LE in preparation for the transfer and then facilitate WBing through that LE to keep it in that position as she initiates the transfer - requires mod assist for balance, guarding R knee as described, and pivoting hips  Pt reports need to use bathroom.  Transported in/out bathroom in w/c.   Squat pivot transfer w/c<>BSC over toilet with goal of carrying over the training and technique used in block practice transfers above. Had pt use L UE support on w/c armrest similarly to how using UE support on bedrail and continued focusing cuing on head/hips relationship - requires mod assist for transfers in this tight space to ensure pt safety. Sit<>stands to/from [facility] over toilet with L UE support on grab bar - pt attempted to initiate LB clothing management to lower clothing with pt trying to lean up against grabbar on her L for stability, but pt starts to have some fear of falling so therapist transitioned to performing dependently. Continent of bladder - seated peri-care set-up assist.  At end of session, pt left seated in w/c with needs in reach, R UE supported on 1/2 lap tray, and seat belt alarm on.   Therapy Documentation Precautions:  Precautions Precautions: Fall Precaution Comments: right UE and LE hemiparesis. Restrictions Weight Bearing Restrictions: No   Pain: No reports of pain throughout session.     Therapy/Group: Individual Therapy   M  ,  PT, DPT, NCS, CSRS 08/08/2022, 8:03 AM

## 2022-08-08 NOTE — Progress Notes (Signed)
Patient ID: Melissa Colon, female   DOB: 03/07/1960, 62 y.o.   MRN: [id number]  Team Conference Report to Patient/Family  Team Conference discussion was reviewed with the patient and caregiver, including goals, any changes in plan of care and target discharge date.  Patient and caregiver express understanding and are in agreement.  The patient has a target discharge date of 08/17/22.  SW met with patient, spouse and son and provided team conference updates. Family expressed they are unable to obtain a ramp due to the cost.  Family anticipates bringing in pt's personal WC, sneakers and photos of the bathroom. Son requesting a letter for disability for the patient.   Son has requested that SW reach out to pt's brother, [name] at [phone].So will call to provide updates.   Son has expressed that he will have to check his schedule before arranging family education. Son plans to FU with SW. Spouse have volunteered to participate in family education on Monday 7/29. Sw will confirm when confirming a day with son. No additional questions or concerns. SW will continue to follow up.   Andria Rhein 08/08/2022, 2:19 PM

## 2022-08-08 NOTE — Progress Notes (Signed)
Occupational Therapy Weekly Progress Note  Patient Details  Name: Melissa Colon MRN: [id number] Date of Birth: 1960-11-21  Beginning of progress report period: August 01, 2022 End of progress report period: August 08, 2022  Today's Date: 08/08/2022 OT Individual Time: 4132-4401 OT Individual Time Calculation (min): 71 min    Patient has met 2 of 3 short term goals and is progressing towards meeting her 3rd goal. [name] is making slow, but steady progress towards reaching her LTGs. Pt is limited by a significant fear of falling and by decreased self-efficacy, however has has mild improvement in her confidence over the past week of her hospital stay. [name] is able to recall sequence for hemi-dressing techniques with min verbal cues and is able to donn shirt min A and pants mod A. Pt is able to perform UB bathing seated EOB with min A and LB bathing mod A. Pt is completing toileting with max A and toilet transfers squat pivot with heavy mod A. Pt is planning to return home with her son and husband. Her son will be her primary caregiver and is planning to be present for family education session over the upcoming week.   Patient continues to demonstrate the following deficits: muscle weakness, decreased cardiorespiratoy endurance, impaired timing and sequencing, abnormal tone, unbalanced muscle activation, decreased coordination, and decreased motor planning, decreased visual motor skills and diplopia, decreased attention to right and decreased motor planning, decreased attention, decreased awareness, decreased problem solving, decreased safety awareness, decreased memory, and delayed processing, central origin, and decreased sitting balance, decreased standing balance, decreased postural control, hemiplegia, and decreased balance strategies and therefore will continue to benefit from skilled OT intervention to enhance overall performance with BADL and Reduce care partner burden.  Patient  progressing toward long term goals..  Continue plan of care.  OT Short Term Goals Week 1:  OT Short Term Goal 1 (Week 1): Pt will complete sit<>stands in preparation for ADLs using LRAD mod A OT Short Term Goal 1 - Progress (Week 1): Met OT Short Term Goal 2 (Week 1): Pt will recall hemi dressing techniques with min verbal cues OT Short Term Goal 2 - Progress (Week 1): Met OT Short Term Goal 3 (Week 1): Pt will complete squat pivot transfers mod Ax1 OT Short Term Goal 3 - Progress (Week 1): Progressing toward goal Week 2:  OT Short Term Goal 1 (Week 2): Pt will utilize compensatory strategies for diplopia with min verbal cues OT Short Term Goal 2 (Week 2): Pt will complete toileting mod A OT Short Term Goal 3 (Week 2): Pt will attend to R visual field/ R hemi-body with min verbal cues consistently  Skilled Therapeutic Interventions/Progress Updates:     Pt received sitting up in bed presenting to be in good spirits receptive to skilled OT session reporting 0/10 pain- OT offering intermittent rest breaks, repositioning, and therapeutic support to optimize participation in therapy session. Focused the beginning of session on d/c planning as Pt's family present for beginning of session. Inquired about Pt's PLOF as Pt is not an accurate reporter with Pt's son, [name], able to report prior to hospital admission he would help Pt with toilet transfers, clothing management during toileting, couch<>wc transfers, and bathing tasks completed while seated on BSC. Pt was using wc as primary means of mobility and was sedentary at baseline. Pt was also sleeping on couch prior to hospital admission d/t increased challenge of getting OOB. Requested for Pt's family to bring in photos of home/bathroom set-up  and provided home-measurement sheet for Pt's son to complete. Educated Pt and family on option of building a ramp to decrease challenge of getting Pt in/out of the house and on need of having a ramp in case of an  emergency requiring a quick exit from the home. Pt's family receptive to education, however expressing concerns regarding building ramp d/t financial reasons. Pt receptive of coming to family education sessions prior to d/c to increase Pt's safety and decrease bourdon of care. Pt's son also receptive to brining in larger pair of shoes for Pt to accommodate AFO and bringing in Pt's wc. Remainder of session focused on BADL retraining and functional transfer training. Pt transitioned to EOB with supervision using bed features. Donned socks, shoes, and ASO total A for time management. Pt fearful of completing squat pivot transfer, despite massed practice opportunities of completing transfers this method in previous therapy sessions. Pt able to verbalize technique for transfer and OT provided maximal therapeutic support and encouragement. Pt then able to complete transfer with heavy min A to quide hips. Pt requesting to use restroom. Transported Pt into bathroom total A in w/c for time management. Educated Pt on squat pivot technique to DABSC when transferring to weaker R side with Pt receptive and able to complete transfer with heavy mod A. Pt then stood using grab bars while OT doffed pants. Provided increased amount of time on toilet d/t need for BM- continent B&B documented in flowsheets. Pt able to perform anterior/posterior peri-care by leaning R/L. Pt stood with min A using grab bar while OT brought pants to waist d/t Pt fear of falling. Squat pivot transfer DABSC over toilet to wc light mod A to guide hips. Transported pt to sink with pt able to wash hands with supervision.  Pt reporting pain in R elbow when moving R UE. Pt noted to have bruises from R UE leaning against wc arm rest and Pt unaware of R UE positioning. Simple education provided on R side inattention following CVA with Pt verbalizing understanding. Applied stockinet up entirety of arm and placed R elbow pad for skin protection with education  provided on purpose. Pt was left resting in wc with R UE positioned on half lap tray, call bell in reach, seat belt alarm on, and all needs met.    Therapy Documentation Precautions:  Precautions Precautions: Fall Precaution Comments: right UE and LE hemiparesis. Restrictions Weight Bearing Restrictions: No   Therapy/Group: Individual Therapy  Army Fossa 08/08/2022, 7:45 AM

## 2022-08-08 NOTE — Progress Notes (Signed)
PROGRESS NOTE   Subjective/Complaints: Pt with MRI yesterday, new area of CVA. She reports continued vision changes, improved with occulusion therapy L eye on glasses.   LBM today  ROS- neg for fevers, abdominal pain CP, SOB, N/V/D +constipation-improved , double vision  Objective:   MR BRAIN WO CONTRAST  Result Date: 08/07/2022 CLINICAL DATA:  Diplopia. EXAM: MRI HEAD WITHOUT CONTRAST TECHNIQUE: Multiplanar, multiecho pulse sequences of the brain and surrounding structures were obtained without intravenous contrast. COMPARISON:  MRI brain 07/28/2022. FINDINGS: Brain: Mixed acute and subacute infarcts in the medial, inferior aspect of the right occipital lobe. Foci of petechial hemorrhage within the areas of subacute infarct. No significant mass effect. Encephalomalacia with surrounding hemosiderin deposition in the right cerebellar hemisphere is unchanged. Old hemorrhage in the left thalamus. Background of moderate chronic small-vessel disease. No acute hydrocephalus or extra-axial collection. Vascular: Normal flow voids. Skull and upper cervical spine: Normal marrow signal. Sinuses/Orbits: Negative. Other: None. IMPRESSION: Mixed acute and subacute infarcts in the medial, inferior aspect of the right occipital lobe. Foci of petechial hemorrhage within the areas of subacute infarct. No significant mass effect. Electronically Signed   By: Orvan Falconer M.D.   On: 08/07/2022 19:45   Recent Labs    08/07/22 0705  WBC 8.3  HGB 11.4*  HCT 35.9*  PLT 335    Recent Labs    08/07/22 0705  NA 136  K 4.2  CL 108  CO2 21*  GLUCOSE 105*  BUN 16  CREATININE 0.60  CALCIUM 8.6*     Intake/Output Summary (Last 24 hours) at 08/08/2022 0840 Last data filed at 08/08/2022 0735 Gross per 24 hour  Intake 480 ml  Output 200 ml  Net 280 ml        Physical Exam: Vital Signs Blood pressure 126/68, pulse 75, temperature 98 F (36.7  C), temperature source Oral, resp. rate 18, height 5\' 2"  (1.575 m), weight 70.8 kg, SpO2 95%.   General: No acute distress.  Mood and affect are appropriate Heart: Regular rate and rhythm no rubs murmurs or extra sounds Lungs: Clear to auscultation, breathing unlabored, no rales or wheezes Abdomen: Positive bowel sounds, soft nontender to palpation, nondistended Extremities: No clubbing, cyanosis, or edema Skin: warm and dry Neurologic: Cranial nerves II through XII grossly intact, delayed responses Moving all 4 extremities antigravity and against resistance  Mild dysarthria  Mood is a little flat Disconjugate gaze , tape on glasses on L Appears to have some L field deficits Prior exams: motor strength is 5/5 in left ,  3+/5 Right deltoid, bicep, tricep, grip,  4/5 RIght and 5/5 left hip flexor, knee extensors, ankle dorsiflexor and plantar flexor Increase tone MAS 2 RIght finger flexors - stable Sensory exam normal sensation to light touch  bilateral upper and lower extremities- except Right hand and wrist  No evidence of visual field cut Mild dysarthria , no word finding issues  Musculoskeletal: Full range of motion in all 4 extremities. No joint swelling   Assessment/Plan: 1. Functional deficits which require 3+ hours per day of interdisciplinary therapy in a comprehensive inpatient rehab setting. Physiatrist is providing close team supervision and 24 hour management of  active medical problems listed below. Physiatrist and rehab team continue to assess barriers to discharge/monitor patient progress toward functional and medical goals  Care Tool:  Bathing    Body parts bathed by patient: Chest, Abdomen, Front perineal area, Right upper leg, Left upper leg, Face, Right arm   Body parts bathed by helper: Buttocks, Left arm, Left lower leg, Right lower leg     Bathing assist Assist Level: Moderate Assistance - Patient 50 - 74%     Upper Body Dressing/Undressing Upper body  dressing   What is the patient wearing?: Pull over shirt    Upper body assist Assist Level: Minimal Assistance - Patient > 75%    Lower Body Dressing/Undressing Lower body dressing      What is the patient wearing?: Pants, Underwear/pull up     Lower body assist Assist for lower body dressing: Moderate Assistance - Patient 50 - 74%     Toileting Toileting Toileting Activity did not occur Press photographer and hygiene only): Refused  Toileting assist Assist for toileting: Maximal Assistance - Patient 25 - 49%     Transfers Chair/bed transfer  Transfers assist  Chair/bed transfer activity did not occur: Safety/medical concerns  Chair/bed transfer assist level: Moderate Assistance - Patient 50 - 74%     Locomotion Ambulation   Ambulation assist   Ambulation activity did not occur: Safety/medical concerns          Walk 10 feet activity   Assist  Walk 10 feet activity did not occur: Safety/medical concerns        Walk 50 feet activity   Assist Walk 50 feet with 2 turns activity did not occur: Safety/medical concerns         Walk 150 feet activity   Assist Walk 150 feet activity did not occur: Safety/medical concerns         Walk 10 feet on uneven surface  activity   Assist Walk 10 feet on uneven surfaces activity did not occur: Safety/medical concerns         Wheelchair     Assist Is the patient using a wheelchair?: Yes Type of Wheelchair: Manual    Wheelchair assist level: Dependent - Patient 0%      Wheelchair 50 feet with 2 turns activity    Assist        Assist Level: Dependent - Patient 0%   Wheelchair 150 feet activity     Assist      Assist Level: Dependent - Patient 0%   Blood pressure 126/68, pulse 75, temperature 98 F (36.7 C), temperature source Oral, resp. rate 18, height 5\' 2"  (1.575 m), weight 70.8 kg, SpO2 95%.  Medical Problem List and Plan: 1. 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 Formulized individual treatment plan based on medical history, underlying problem and comorbidities.   2.  Antithrombotics: -DVT/anticoagulation:  Pharmaceutical: Lovenox             -  antiplatelet therapy: Aspirin 81 mg daily and Plavix 75 mg daily x 90 days then aspirin alone 3. Pain Management: Tylenol as needed   - 7/20: schedule tylenol 1000 mg TID. Has PRN robaxin.    - 7/21-4: no c/o pain; monitor  4. Mood/Behavior/Sleep: Provide emotional support             -antipsychotic agents: N/A 5. Neuropsych/cognition: This patient is capable of making decisions on her own behalf. 6. Skin/Wound Care: Routine skin checks 7. Fluids/Electrolytes/Nutrition: Routine in and outs with follow-up chemistries 8.  Permissive hypertension.  Monitor with increased mobility    08/08/2022    5:12 AM 08/07/2022    7:36 PM 08/07/2022    5:48 AM  Vitals with BMI  Weight 156 lbs 1 oz    BMI 28.54    Systolic 126 135 784  Diastolic 68 72 59  Pulse 75 75 75   7/22-4 Normotensive off medications, continue to monitor BP  9.  Hyperlipidemia.  Lipitor Lipid Panel     Component Value Date/Time   CHOL 184 07/28/2022 0523   TRIG 130 07/28/2022 0523   HDL 35 (L) 07/28/2022 0523   CHOLHDL 5.3 07/28/2022 0523   VLDL 26 07/28/2022 0523   LDLCALC 123 (H) 07/28/2022 0523    10.  History of alcohol tobacco use.  Counseling, nicoderm patch  11.  Overweight  BMI 28.47.  Dietary follow-up 12.  Medical  noncompliance.  Counseling- doesn't like to take meds, esp HTN meds.   13.  Straining to have BM, will add colace    - LBM 7/23, pt reports still feeling constipated, will add senokot  -LBM today-improved, continue current regimen  14.  Hypokalemia s/p supplementation recheck K + in am  - 3.2 7/19   - Add kdur 20 mg daily for 2 days, recheck Monday  -7/23 K+ stable at 4.2 15. Anemia-mild  -7/23 HGB 11.4 this AM, a little decreased from prior, recheck Thursday, check occult blood -stool, recheck CBC later in the week 16. Dizziness/double vision intermittent. Pt reports this has been going on since CVA  -Can try occlusion, will order meclizine 25mg  TID PRN  -7/23 discussed with therapy.  Patient had double vision last week that improved Friday but has returned today.  It appears she did have disconjugate gaze documented on her H&P.  He has also been noting some left visual field deficits.  Discussed with neurology Dr. Guss Bunde order MRI for further evaluation  7/23 continue occlusion therapy, New area CVA noted on MRI, discussed with neurology, plt study , no additional changes at this time, appreciate assistance  LOS: 8 days A FACE TO FACE EVALUATION WAS PERFORMED  Fanny Dance 08/08/2022, 8:40 AM

## 2022-08-08 NOTE — Plan of Care (Signed)
  Problem: Consults Goal: RH STROKE PATIENT EDUCATION Description: See Patient Education module for education specifics  Outcome: Progressing   Problem: RH SAFETY Goal: RH STG ADHERE TO SAFETY PRECAUTIONS W/ASSISTANCE/DEVICE Description: STG Adhere to Safety Precautions With cues Assistance/Device. Outcome: Progressing   Problem: RH KNOWLEDGE DEFICIT Goal: RH STG INCREASE KNOWLEDGE OF HYPERTENSION Description: Patient and family will be able to manage HTN with medications and dietary modifications using educational resources independently Outcome: Progressing Goal: RH STG INCREASE KNOWLEGDE OF HYPERLIPIDEMIA Description: Patient and family will be able to manage HLD with medications and dietary modifications using educational resources independently Outcome: Progressing Goal: RH STG INCREASE KNOWLEDGE OF STROKE PROPHYLAXIS Description: Patient and family will be able to manage secondary risks with medications and dietary modifications using educational resources independently Outcome: Progressing   Problem: RH Vision Goal: RH LTG Vision (Specify) Outcome: Progressing

## 2022-08-08 NOTE — Patient Care Conference (Signed)
Inpatient RehabilitationTeam Conference and Plan of Care Update Date: 08/08/2022   Time: 10:36 AM    Patient Name: Melissa Colon      Medical Record Number: [id number]  Date of Birth: 09/03/1960 Sex: Female         Room/Bed: 4W24C/4W24C-01 Payor Info: Payor: [place] MEDICAID PREPAID HEALTH PLAN / Plan: [place] MEDICAID [place] COMPLETE HEALTH / Product Type: *No Product type* /    Admit Date/Time:  07/31/2022  5:22 PM  Primary Diagnosis:  Acute ischemic right MCA stroke [facility])  Hospital Problems: Principal Problem:   Acute ischemic right MCA stroke [facility]) Active Problems:   Anemia    Expected Discharge Date: Expected Discharge Date: 08/17/22  Team Members Present: Physician leading conference: Dr. Fanny Dance Social Worker Present: Lavera Guise, BSW Nurse Present: Chana Bode, RN PT Present: Casimiro Needle, PT OT Present: Bonnell Public, OT SLP Present: Other (comment) Alvera Novel, SLP) PPS Coordinator present : Fae Pippin, SLP     Current Status/Progress Goal Weekly Team Focus  Bowel/Bladder   cont of B&B   remain cont of B&B   assist with toileting as needed    Swallow/Nutrition/ Hydration               ADL's   UB dressing superivision, LB dressing mod A, UB bathing min A, LB bathing mod A seated in wc (pt reports she does not like taking showers), stand pivot transfers to toilet heavy mod a using grab bars, min A grooming/hygine tasks; Barriers- fear of falling, anxiety, decreased functional use of R UE, visual impairments, decreased activity tolerance (pt sedentary at baseline)   min A over all   functional cognition, visual assessment, R UE NMR, standing balance, activity tolerance, functional transfer trainning, BADL retraining, Pt education on energy conservation, CVA recovery, and fall prevention    Mobility   CGA bed mobility using hospital bed features, min/mod A sit<>stands using RW with high fear of falling, mod A squat pivot transfers, attempted  progression to stand pivots using RW but pt with high fear of falling along with impaired proprioception sensation in R LE impacting coordination when stepping R LE to complete transfer; only performing gait training at hallway rail at this time and pt was not functional ambulator at baseline   min A overall at primarily w/c level  per chart review, pt had custom w/c eval while on CIR in 2021, will reach out to get information as pt states she was not able to self-propel at baseline due to thick carpet; pt has STE house and needs ramped entrance as pt states baseline she was not able to get in/out of her home unless via ambulance; will need family to come in soon to provide more information about PLOF & home set-up to help w/ D/C planning; pt with high fear of falilng impacting her success with more independent transfer training    Communication                Safety/Cognition/ Behavioral Observations               Pain   mild pain reported, controlled with scheduled tylenol   pain remain controlled   assess pain qshift and prn    Skin   skin intact   skin remain intact  assess skin qshift and prn      Discharge Planning:  Patient discharging home with assistance from her brother, son and spouse 24/7.   Team Discussion: Patient with disconjugate gaze; reported double vision  and dizziness - MRI showed new changes; Neurology follow up pending. Limited by poor activity tolerance and limited proprioception in right lower extremity with a fear of falling.  Patient on target to meet rehab goals: yes, currently needs supervision for upper body bathing when seated and mod assist for lower body bathing;  sponge bathes at home.  Needs min assist for upper body dressing and mod assist for lower body dressing. Unsteady with stand pivot transfers, able to complete squat pivot tranfers with mod assist using a bed rail.   *See Care Plan and progress notes for long and short-term goals.    Revisions to Treatment Plan:  Left eye occulusion glasses  Right AFO/ankle brace Wrist cock-up splint  Teaching Needs: Safety, medication, skin care/wound care, dietary modification, transfers, toileting, etc.  Current Barriers to Discharge: Decreased caregiver support and Home enviroment access/layout  Possible Resolutions to Barriers: Family education HH follow up services DME: BSC, hospital bed with rails Ramp installation for entry to the home     Medical Summary Current Status: CVA with new areas of infact, Constipationed, Hypokalemia, dizziness, HTN,  Barriers to Discharge: Electrolyte abnormality;Medical stability  Barriers to Discharge Comments: CVA with new areas of infact, Constipationed, Hypokalemia, dizziness, HTN, Possible Resolutions to [employer] Focus: neurology consult, monitor labs, occlusion therapy for vision   Continued Need for Acute Rehabilitation Level of Care: The patient requires daily medical management by a physician with specialized training in physical medicine and rehabilitation for the following reasons: Direction of a multidisciplinary physical rehabilitation program to maximize functional independence : Yes Medical management of patient stability for increased activity during participation in an intensive rehabilitation regime.: Yes Analysis of laboratory values and/or radiology reports with any subsequent need for medication adjustment and/or medical intervention. : Yes   I attest that I was present, lead the team conference, and concur with the assessment and plan of the team.   Chana Bode B 08/08/2022, 1:54 PM

## 2022-08-08 NOTE — Progress Notes (Signed)
Orthopedic Tech Progress Note Patient Details:  Melissa Colon October 23, 1960 [id number] Resting WHO & AFO Consult have been ordered from [facility]  Patient ID: Melissa Colon, female   DOB: [birth year], 62 y.o.   MRN: [id number]  Melissa Colon  08/08/2022, 11:01 AM

## 2022-08-08 NOTE — Progress Notes (Signed)
STROKE TEAM PROGRESS NOTE   INTERVAL HISTORY Worsening vision on the left side. MRI ordered and shows new CVA.   Vitals:   08/06/22 1953 08/07/22 0548 08/07/22 1936 08/08/22 0512  BP: 126/69 (!) 127/59 135/72 126/68  Pulse: 76 75 75 75  Resp: 18 17 16 18   Temp: 97.6 F (36.4 C) 97.6 F (36.4 C) 97.9 F (36.6 C) 98 F (36.7 C)  TempSrc: Oral  Oral Oral  SpO2: 95% 96% 96% 95%  Weight:    70.8 kg  Height:       CBC:  Recent Labs  Lab 08/07/22 0705  WBC 8.3  HGB 11.4*  HCT 35.9*  MCV 88.0  PLT 335   Basic Metabolic Panel:  Recent Labs  Lab 08/03/22 0529 08/07/22 0705  NA 139 136  K 3.2* 4.2  CL 109 108  CO2 21* 21*  GLUCOSE 95 105*  BUN 18 16  CREATININE 0.57 0.60  CALCIUM 8.1* 8.6*   Lipid Panel:  No results for input(s): "CHOL", "TRIG", "HDL", "CHOLHDL", "VLDL", "LDLCALC" in the last 168 hours.  HgbA1c:  No results for input(s): "HGBA1C" in the last 168 hours.  Urine Drug Screen:  No results for input(s): "LABOPIA", "COCAINSCRNUR", "LABBENZ", "AMPHETMU", "THCU", "LABBARB" in the last 168 hours.   Alcohol Level  No results for input(s): "ETH" in the last 168 hours.   IMAGING past 24 hours MR BRAIN WO CONTRAST  Result Date: 08/07/2022 CLINICAL DATA:  Diplopia. EXAM: MRI HEAD WITHOUT CONTRAST TECHNIQUE: Multiplanar, multiecho pulse sequences of the brain and surrounding structures were obtained without intravenous contrast. COMPARISON:  MRI brain 07/28/2022. FINDINGS: Brain: Mixed acute and subacute infarcts in the medial, inferior aspect of the right occipital lobe. Foci of petechial hemorrhage within the areas of subacute infarct. No significant mass effect. Encephalomalacia with surrounding hemosiderin deposition in the right cerebellar hemisphere is unchanged. Old hemorrhage in the left thalamus. Background of moderate chronic small-vessel disease. No acute hydrocephalus or extra-axial collection. Vascular: Normal flow voids. Skull and upper cervical spine:  Normal marrow signal. Sinuses/Orbits: Negative. Other: None. IMPRESSION: Mixed acute and subacute infarcts in the medial, inferior aspect of the right occipital lobe. Foci of petechial hemorrhage within the areas of subacute infarct. No significant mass effect. Electronically Signed   By: Orvan Falconer M.D.   On: 08/07/2022 19:45    PHYSICAL EXAM  Temp:  [97.9 F (36.6 C)-98 F (36.7 C)] 98 F (36.7 C) (07/24 0512) Pulse Rate:  [75] 75 (07/24 0512) Resp:  [16-18] 18 (07/24 0512) BP: (126-135)/(68-72) 126/68 (07/24 0512) SpO2:  [95 %-96 %] 95 % (07/24 0512) Weight:  [70.8 kg] 70.8 kg (07/24 0512)  General - Well nourished, well developed, in no apparent distress. Cardiovascular - Regular rhythm and rate.  Mental Status -  Level of arousal and orientation to time, place, and person were intact.  Mild dysarthria Language including expression, naming, repetition, comprehension was assessed and found intact. Attention span and concentration were normal.  Cranial Nerves II - XII - II - Visual field: left visual field deficits more lateral and patchy. Wearing glasses for diplopia. She has double vision (1.5 x) with primary gaze and left lateral gaze, improved with right gaze and up/down gaze. III, IV, VI - Extraocular movements intact. V -decreased on right VII -right facial droop VIII - Hearing & vestibular intact bilaterally. X - Palate elevates symmetrically. XI - Chin turning & shoulder shrug intact bilaterally. XII - Tongue protrusion intact.  Motor Strength -right upper extremity  and right lower extremity 23/5 (old), left upper and lower 5/5  Sensory -intact to LT in both arms/legs.  Coordination - The patient had normal movements in the hands and feet with no ataxia or dysmetria.  Tremor was absent.  Gait and Station - deferred.  ASSESSMENT/PLAN [name] is a 62 y.o. 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 Plan for DAPT, Aspirin and Plavix for a total of 90 days due to intracranial atherosclerosis and continue to Aspirin alone.   Stroke: Acute right occipital lobe ischemic infarct Etiology: Likely due to  intracranial stenosis Code Stroke  CT head No acute abnormality.  Small vessel disease.  ASPECTS 10.     CTA head & neck multifocal severe P2 PCA stenosis, critical stenosis versus occlusion of distal right ACA, severe right P2 PCA, severe right M2 MCA small nondominant left intradural vertebral artery with severe stenosis and distal occlusion, moderate distal basilar artery stenosis, moderate paraclinoid ICA stenosis laterally MRI acute right occipital lobe infarct. MRI 08/08/2022: IMPRESSION: Mixed acute and subacute infarcts in the medial, inferior aspect of      the right occipital lobe. Foci of petechial hemorrhage within the areas of subacute infarct.  2D Echo EF 65 to 70%.  Left ventricle with grade 1 diastolic dysfunction LDL 123 HgbA1c 5.6 VTE prophylaxis -Lovenox    Diet   Diet regular Fluid consistency: Thin   now on aspirin 81 mg daily and clopidogrel 75 mg daily. Plan to continue with ASA and Plavix for a total of 90 due to intracranial atherosclerosis and then to continue with Aspirin alone.  Therapy recommendations: Home PT/OT Disposition: Home with referral to home services.   Hypertension Home meds: None Stable Permissive hypertension (OK if < 220/120) but gradually normalize in 5-7 days Long-term BP goal normotensive  Hyperlipidemia Home meds: None LDL 123, goal < 70 Add atorvastatin 80 mg Continue statin at discharge  Other Stroke Risk Factors Cigarette smoker advised to stop smoking ETOH use, alcohol level <10, advised to drink  no more than 1 drink(s) a day Hx stroke/TIA   Hospital day # 2  This patient recent occipital stroke sent to rehab had worsening vision repeat MRI shows mixed acute/subacute infarcts in the medial inferior aspect of the right occipital lobe with petechial hemorrhage.  On aspirin and Plavix while in rehab.  Will check antiplatelet resistance labs.  Consider switching antiplatelet medication based on the result.  Discussed with rehab attending.  To contact Stroke Continuity provider, please refer to [email or web address]. After hours, contact General Neurology

## 2022-08-09 ENCOUNTER — Inpatient Hospital Stay (HOSPITAL_COMMUNITY): Payer: Medicaid Other

## 2022-08-09 DIAGNOSIS — D62 Acute posthemorrhagic anemia: Secondary | ICD-10-CM

## 2022-08-09 LAB — CBC
HCT: 32.9 % — ABNORMAL LOW (ref 36.0–46.0)
MCH: 27.5 pg (ref 26.0–34.0)
MCHC: 32.2 g/dL (ref 30.0–36.0)
MCV: 85.2 fL (ref 80.0–100.0)
Platelets: 338 10*3/uL (ref 150–400)
RBC: 3.86 MIL/uL — ABNORMAL LOW (ref 3.87–5.11)
RDW: 13.3 % (ref 11.5–15.5)
nRBC: 0 % (ref 0.0–0.2)

## 2022-08-09 MED ORDER — TICAGRELOR 90 MG PO TABS
90.0000 mg | ORAL_TABLET | Freq: Two times a day (BID) | ORAL | Status: DC
  Administered 2022-08-09 – 2022-08-17 (×16): 90 mg via ORAL
  Filled 2022-08-09 (×16): qty 1

## 2022-08-09 NOTE — Progress Notes (Signed)
Physical Therapy Session Note  Patient Details  Name: Melissa Colon MRN: [id number] Date of Birth: 1960-02-03  Today's Date: 08/09/2022 PT Individual Time: 0800-0845 PT Individual Time Calculation (min): 45 min   Short Term Goals: Week 2:  PT Short Term Goal 1 (Week 2): Pt will perform supine<>sit using bed features per home set-up with supervision consistently PT Short Term Goal 2 (Week 2): Pt will perform bed<>chair transfers using LRAD with no more than mod assist consistently PT Short Term Goal 3 (Week 2): Pt will perform sit<>stands using LRAD with no more than mod assist consistently PT Short Term Goal 4 (Week 2): Pt will perform L hemi-propulsion w/c mobility at least 70ft with supervision  Skilled Therapeutic Interventions/Progress Updates:  Patient greeted sitting on the toilet in her room with NT present and transitioned to PT care- Patient agreeable to PT treatment session. Patient with continent void, however unsuccessful with BM. Patient stood with stedy and CGA for safety- Once standing, therapist performed front and back pericare in order to ensure cleanliness, as well as managing pants and brief. Patient then transferred to her wc via stedy and CGA. Patient wheeled to/from her room and day room for time management and energy conservation.   Patient performed multiple sit/stands with L HR and CGA/MinA for initiating standing. Once standing, patient performed x10 lateral weight shift with emphasis on Rt LE acceptance while therapist managed Rt knee in order to prevent buckling and hyperextension. Once in midline, patient then performed x10 Rt knee flexion/extension in order to improve stability throughout stance phase- Therapist providing TC for improved muscle activation throughout activity. Mirror placed in front of patient for improved external visual cues regarding Rt LE secondary absent sensation. Patient performed the above x3 trials with seated rest breaks in between.    Patient returned to her room and left sitting upright in wheelchair with posey belt on, call bell within reach and all needs met.    Therapy Documentation Precautions:  Precautions Precautions: Fall Precaution Comments: right UE and LE hemiparesis. Restrictions Weight Bearing Restrictions: No  Pain: Patient reports 4/10 pain in Lt knee with functional mobility tasks, however subsides with rest.    Therapy/Group: Individual Therapy     08/09/2022, 7:54 AM

## 2022-08-09 NOTE — Progress Notes (Signed)
Occupational Therapy Session Note  Patient Details  Name: Melissa Colon MRN: [id number] Date of Birth: Apr 02, 1960  Today's Date: 08/09/2022 OT Individual Time: 0947-1100 OT Individual Time Calculation (min): 73 min    Short Term Goals: Week 2:  OT Short Term Goal 1 (Week 2): Pt will utilize compensatory strategies for diplopia with min verbal cues OT Short Term Goal 2 (Week 2): Pt will complete toileting mod A OT Short Term Goal 3 (Week 2): Pt will attend to R visual field/ R hemi-body with min verbal cues consistently  Skilled Therapeutic Interventions/Progress Updates:     Pt received sitting up in chair dressed and ready for the day upon OT arrival with all BADL needs met. Pt presenting to be in good spirits receptive to skilled OT session reporting 0/10 pain- OT offering intermittent rest breaks, repositioning, and therapeutic support to optimize participation in therapy session. Pt did report pain in elbow during sit<>stand transfers- notified RN with Pt request for Voltaren gel following session.   Pt noted to have extra resting hand splint in room upon OT arrival, however order was place for WHO (wrist cockup splint) for day time use to prevent injury to Pt's wrist. Informed ortho tech of mistake with wrong orthotic being dropped off to Pt's room.  Pt transported total A to therapy gym in wc for time management and energy conservation.   Focused large amount of time on blocked practice of squat pivot transfers from wc<>EOM to simulate transfer technique to be used at home when getting on/off toilet. Pt continuing to present with significant fear of falling and requiring increased time and motivation to engage in functional transfer training. Provided re-education on hip/head relationship and weight shifting for transfer with demonstration provided to support learning. Pt continues to demonstrate challenges recalling technique and implementing despite many opportunities provided to  practice technique. Pt able to complete transfers this session wc<>EOM both R and L with heavy min to mod A with OT stabilizing L foot to maintain positioning during transfer and facilitating Wb'ing through R LE.   Pt completed massed practice of sit<>stands with maintained standing incorporated into task to increase balance for BADLs. Pt able to complete sit<>stands x5 trials using RW  min A and maintain standing balance 15-30 seconds per trial with OT stabilizing R LE and facilitating Wb'ing to prevent knee from buckling. During final 3 trials, incorporated Pt removing L UE from RW and reaching posteriorly to simulate posterior reaching for clothing management during BADLs. Pt able to   Engaged Pt in seated FM coordination functional reaching task with maintained grasp facilitated into task. Pt instructed to reach anteriorly shoulder height or below to prevent pain from R UE subluxation and retrieve squigz from vertical mirror. Pt requiring min hand under hand assist to facilitate pronation and hand positioning during grasp. Pt able to complete task with maximal effort and increased amount of time required between trials. Pt continuing to present with motor planning challenges and ataxic R UE movements during functional reaching.   Pt transported back to room total A in wc. Pt was left resting in wc with call bell in reach, seat belt alarm on, and all needs met.    Therapy Documentation Precautions:  Precautions Precautions: Fall Precaution Comments: right UE and LE hemiparesis. Restrictions Weight Bearing Restrictions: No  Therapy/Group: Individual Therapy  Army Fossa 08/09/2022, 8:02 AM

## 2022-08-09 NOTE — Progress Notes (Signed)
Physical Therapy Session Note  Patient Details  Name: Melissa Colon MRN: [id number] Date of Birth: 04/09/1960  Today's Date: 08/09/2022 PT Individual Time: 1118-1202 PT Individual Time Calculation (min): 44 min   Short Term Goals: Week 2:  PT Short Term Goal 1 (Week 2): Pt will perform supine<>sit using bed features per home set-up with supervision consistently PT Short Term Goal 2 (Week 2): Pt will perform bed<>chair transfers using LRAD with no more than mod assist consistently PT Short Term Goal 3 (Week 2): Pt will perform sit<>stands using LRAD with no more than mod assist consistently PT Short Term Goal 4 (Week 2): Pt will perform L hemi-propulsion w/c mobility at least 30ft with supervision  Skilled Therapeutic Interventions/Progress Updates:     Pt received seated in [facility] and agrees to therapy. Reports some pain in RUE. Number not provided. PT provides rest breaks and repositioning to manage pain. WC transport to gym. Pt performs multiple reps of sit to stand during session with modA and cues for anterior weight shift, initiation, body mechanics, and sequencing. In standing, pt performs lateral weight shifts from R<>L with PT providing manual facilitation of hips extension, as well as tactile cueing at Lt knee to promote knee extension and prevent buckling. Music utilized for mood enhancer and rhythmic influencer to assist with weight shifting and confidence, as pt verbalizes fear of falling multiple times during session. Pt able to stand for 1-2 minutes at time for each bout. X3 total with extended seated rest breaks in between each bout. WC transport back to room. Left seated in WC with alarm intact and all needs within reach.  Therapy Documentation Precautions:  Precautions Precautions: Fall Precaution Comments: right UE and LE hemiparesis. Restrictions Weight Bearing Restrictions: No   Therapy/Group: Individual Therapy  Beau Fanny, PT, DPT 08/09/2022, 12:07 PM

## 2022-08-09 NOTE — Progress Notes (Addendum)
Patient ID: Melissa Colon, female   DOB: 1960-09-30, 62 y.o.   MRN: [id number]  SW spoke with pt son, [name] and arranged family education. Son and his daughter 62 Y.O) will present tomorrow 9:30 AM- 12 PM to complete family education.   [name] expressed family has been caring for patient since 2021 (past CVA). Pt and family prefers that patient does not have a hospital bed due to limited space and the patient does not currently sleep in the bedroom. Family has patient set up in the living sleeping on couch. Pt prefers the couch for comfortability and Pt and family express the couch is easier for family to compete transfers and provide assistance at home.  Son expressed that the family has made attempts to complete a ramp and received assistance from a church group to complete. The family is running the issue of the ramp being to high and long. Pt spouse declines ramp. Family shared that the patient remains on the couch with family assisting with meals, housekeeping, ADLS, medications, etc. Family arranges medical transportation for appointments. Patient has not walked since 2021 due to past CVA.    Patient was just approved for Medicare A & B. [name] is assisting the patient with establishing Disability and requesting a letter for disability. SW will arrange.   [name] and his daughter will be assisting the patient and family physically for an additional month and then pt's other son will be taking over 24/7. [name] expressed they are in the process of educating him on all care needs and how to manage patient at home. Spouse is unable to assist physically. No additional questions or concerns.

## 2022-08-09 NOTE — Progress Notes (Signed)
Physical Therapy Session Note  Patient Details  Name: Melissa Colon MRN: [id number] Date of Birth: [birth year]  Today's Date: 08/09/2022 PT Individual Time: 1310-1407 PT Individual Time Calculation (min): 57 min   Short Term Goals: Week 2:  PT Short Term Goal 1 (Week 2): Pt will perform supine<>sit using bed features per home set-up with supervision consistently PT Short Term Goal 2 (Week 2): Pt will perform bed<>chair transfers using LRAD with no more than mod assist consistently PT Short Term Goal 3 (Week 2): Pt will perform sit<>stands using LRAD with no more than mod assist consistently PT Short Term Goal 4 (Week 2): Pt will perform L hemi-propulsion w/c mobility at least 14ft with supervision  Skilled Therapeutic Interventions/Progress Updates:    Pt received sitting in w/c and agreeable to therapy session. Thayer Ohm, [facility] present for AFO consultation. Discussed pt's bracing needs to control risk of rolling ankle - informed pt has active ankle DF/PF but has severe sensory deficits placing her foot in an unsafe position during transfers. Based on this information, determined that an Aircast AirSport ASO would be most appropriate because it is easy to don/doff and has good support to decrease risk of ankle inversion rolling. Determined that would be better than custom PLS AFO because it would still allow pt to perform active ankle DF/PF - also since pt can perform active ankle movement she is at risk for moving foot inside of the PLS AFO and causing skin breakdown. Pt in agreement with bracing recommendation. CPO will deliver one tomorrow.  Block practice transfer training w/c<>EOB using L UE support on bedrail with heavy min/light mod assist for lifting/pivoting hips and therapist providing WBing facilitation down through R LE to ensure foot stays planted due to sensory deficits - continued cuing for head/hips relationship as pt continues to have poor carryover of education on proper motor  planning. Noticed pt's R foot sliding forward today during transfer due to pt lacking sufficient anterior trunk lean/weight shift to place weight down through LEs to keep her feet planted (despite therapists attempts to stabilize), cuing for increased anterior trunk lean.  Therapist discussed SW updates with pt, regarding family's comments on ramp and hospital bed vs couch. Pt becomes tearful stating in the past that her family was unable to support her in getting these items also - therapist will ask that family is educated on reasoning for these recommendations during PT session tomorrow.  L squat pivot back to bed as described above with pt again having poor motor planning with poor head/hips relationship and requiring a 2nd attempt to complete transfer safely with mod assist.  Sit>supine using L UE support on bedrail with supervision. Pt left supine in bed with needs in reach and bed alarm on.     Therapy Documentation Precautions:  Precautions Precautions: Fall Precaution Comments: right UE and LE hemiparesis. Restrictions Weight Bearing Restrictions: No   Pain:  Denies pain during session.    Therapy/Group: Individual Therapy  Ginny Forth , PT, DPT, NCS, CSRS 08/09/2022, 8:04 AM

## 2022-08-09 NOTE — Progress Notes (Signed)
PROGRESS NOTE   Subjective/Complaints: Patient reports she still feels a little constipated although she had a bowel movement yesterday.  No additional concerns elicited.  LBM today  ROS- neg for fevers, abdominal pain CP, SOB, N/V/D, new motor or sensory changes +constipation-improved , double vision  Objective:   DG Elbow 2 Views Right  Result Date: 08/08/2022 CLINICAL DATA:  Pain aggravated by exercise EXAM: RIGHT ELBOW - 2 VIEW COMPARISON:  None Available. FINDINGS: No evidence of acute fracture or dislocation. No elbow joint effusion. Demineralization. Mild degenerative changes about the elbow. IMPRESSION: No acute fracture or dislocation. Electronically Signed   By: Minerva Fester M.D.   On: 08/08/2022 18:51   MR BRAIN WO CONTRAST  Result Date: 08/07/2022 CLINICAL DATA:  Diplopia. EXAM: MRI HEAD WITHOUT CONTRAST TECHNIQUE: Multiplanar, multiecho pulse sequences of the brain and surrounding structures were obtained without intravenous contrast. COMPARISON:  MRI brain 07/28/2022. FINDINGS: Brain: Mixed acute and subacute infarcts in the medial, inferior aspect of the right occipital lobe. Foci of petechial hemorrhage within the areas of subacute infarct. No significant mass effect. Encephalomalacia with surrounding hemosiderin deposition in the right cerebellar hemisphere is unchanged. Old hemorrhage in the left thalamus. Background of moderate chronic small-vessel disease. No acute hydrocephalus or extra-axial collection. Vascular: Normal flow voids. Skull and upper cervical spine: Normal marrow signal. Sinuses/Orbits: Negative. Other: None. IMPRESSION: Mixed acute and subacute infarcts in the medial, inferior aspect of the right occipital lobe. Foci of petechial hemorrhage within the areas of subacute infarct. No significant mass effect. Electronically Signed   By: Orvan Falconer M.D.   On: 08/07/2022 19:45   Recent Labs     08/07/22 0705 08/09/22 0635  WBC 8.3 6.8  HGB 11.4* 10.6*  HCT 35.9* 32.9*  PLT 335 338    Recent Labs    08/07/22 0705  NA 136  K 4.2  CL 108  CO2 21*  GLUCOSE 105*  BUN 16  CREATININE 0.60  CALCIUM 8.6*     Intake/Output Summary (Last 24 hours) at 08/09/2022 0865 Last data filed at 08/09/2022 0814 Gross per 24 hour  Intake 720 ml  Output --  Net 720 ml        Physical Exam: Vital Signs Blood pressure 115/61, pulse 79, temperature 98.4 F (36.9 C), temperature source Oral, resp. rate 17, height 5\' 2"  (1.575 m), weight 70.8 kg, SpO2 99%.   General: No acute distress.  Mood and affect are appropriate Heart: Regular rate and rhythm no rubs murmurs or extra sounds Lungs: Clear to auscultation, breathing unlabored, no rales or wheezes Abdomen: Positive bowel sounds, soft nontender to palpation, nondistended Extremities: No clubbing, cyanosis, or edema Skin: warm and dry Neurologic: Cranial nerves II through XII grossly intact, delayed responses Moving all 4 extremities antigravity and against resistance  Mild dysarthria  Mood appropriate and pleasant today Disconjugate gaze , tape on glasses on L Appears to have some L field deficits Prior exams: motor strength is 5/5 in left ,  3+/5 Right deltoid, bicep, tricep, grip,  4/5 RIght and 5/5 left hip flexor, knee extensors, ankle dorsiflexor and plantar flexor Increase tone MAS 2 RIght finger flexors - stable Sensory exam  normal sensation to light touch  bilateral upper and lower extremities- except Right hand and wrist  No evidence of visual field cut Mild dysarthria , no word finding issues  Musculoskeletal: Full range of motion in all 4 extremities. No joint swelling   Assessment/Plan: 1. Functional deficits which require 3+ hours per day of interdisciplinary therapy in a comprehensive inpatient rehab setting. Physiatrist is providing close team supervision and 24 hour management of active medical problems  listed below. Physiatrist and rehab team continue to assess barriers to discharge/monitor patient progress toward functional and medical goals  Care Tool:  Bathing    Body parts bathed by patient: Chest, Abdomen, Front perineal area, Right upper leg, Left upper leg, Face, Right arm   Body parts bathed by helper: Buttocks, Left arm, Left lower leg, Right lower leg     Bathing assist Assist Level: Moderate Assistance - Patient 50 - 74%     Upper Body Dressing/Undressing Upper body dressing   What is the patient wearing?: Pull over shirt    Upper body assist Assist Level: Minimal Assistance - Patient > 75%    Lower Body Dressing/Undressing Lower body dressing      What is the patient wearing?: Pants, Underwear/pull up     Lower body assist Assist for lower body dressing: Moderate Assistance - Patient 50 - 74%     Toileting Toileting Toileting Activity did not occur Press photographer and hygiene only): Refused  Toileting assist Assist for toileting: Maximal Assistance - Patient 25 - 49%     Transfers Chair/bed transfer  Transfers assist  Chair/bed transfer activity did not occur: Safety/medical concerns  Chair/bed transfer assist level: Moderate Assistance - Patient 50 - 74% Chair/bed transfer assistive device: Armrests, Bedrails   Locomotion Ambulation   Ambulation assist   Ambulation activity did not occur: Safety/medical concerns          Walk 10 feet activity   Assist  Walk 10 feet activity did not occur: Safety/medical concerns        Walk 50 feet activity   Assist Walk 50 feet with 2 turns activity did not occur: Safety/medical concerns         Walk 150 feet activity   Assist Walk 150 feet activity did not occur: Safety/medical concerns         Walk 10 feet on uneven surface  activity   Assist Walk 10 feet on uneven surfaces activity did not occur: Safety/medical concerns         Wheelchair     Assist Is the  patient using a wheelchair?: Yes Type of Wheelchair: Manual    Wheelchair assist level: Dependent - Patient 0%      Wheelchair 50 feet with 2 turns activity    Assist        Assist Level: Dependent - Patient 0%   Wheelchair 150 feet activity     Assist      Assist Level: Dependent - Patient 0%   Blood pressure 115/61, pulse 79, temperature 98.4 F (36.9 C), temperature source Oral, resp. rate 17, height 5\' 2"  (1.575 m), weight 70.8 kg, SpO2 99%.  Medical Problem List and Plan: 1. 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  Antithrombotics: -DVT/anticoagulation:  Pharmaceutical: Lovenox             -antiplatelet therapy: Aspirin 81 mg daily and Plavix 75 mg daily x 90 days then aspirin alone 3. Pain Management: Tylenol as needed   - 7/20: schedule tylenol 1000 mg TID. Has PRN robaxin.    - 7/21-5: no c/o pain; monitor  4. Mood/Behavior/Sleep: Provide emotional support             -antipsychotic agents: N/A 5. Neuropsych/cognition: This patient is capable of making decisions on her own behalf. 6. Skin/Wound Care: Routine skin checks 7. Fluids/Electrolytes/Nutrition: Routine in and outs with follow-up chemistries 8.  Permissive hypertension.  Monitor with increased mobility    08/09/2022    4:25 AM 08/08/2022    7:41 PM 08/08/2022    1:46 PM  Vitals with BMI  Systolic 115 130 161  Diastolic 61 76 77  Pulse  79 79 90   7/22-5 Normotensive off medications, continue to monitor BP -7/25 okay with permissive hypertension per neurology if BP less than 220/120, gradually decrease over 5 to 7 days, long-term goal normotensive  9.  Hyperlipidemia.  Lipitor Lipid Panel     Component Value Date/Time   CHOL 184 07/28/2022 0523   TRIG 130 07/28/2022 0523   HDL 35 (L) 07/28/2022 0523   CHOLHDL 5.3 07/28/2022 0523   VLDL 26 07/28/2022 0523   LDLCALC 123 (H) 07/28/2022 0523    10.  History of alcohol tobacco use.  Counseling, nicoderm patch  11.  Overweight  BMI 28.47.  Dietary follow-up 12.  Medical noncompliance.  Counseling- doesn't like to take meds, esp HTN meds.   13.  Straining to have BM, will add colace    - LBM 7/23, pt reports still feeling constipated, will add senokot  -7/25 patient had BM yesterday however still feels constipated, will check abdominal x-ray  14.  Hypokalemia s/p supplementation recheck K + in am  - 3.2 7/19   - Add kdur 20 mg daily for 2 days, recheck Monday  -7/23 K+ stable at 4.2 15. Anemia  -7/23 HGB 11.4 this AM, a little decreased from prior, recheck  -Thursday, check occult blood -stool, recheck CBC later in the week -7/25 Hgb down to 10.6, will ask nursing to check occult stool with next BM   16. Dizziness/double vision intermittent. Pt reports this has been going on since CVA  -Can try occlusion, will order meclizine 25mg  TID PRN  -7/23 discussed with therapy.  Patient had double vision last week that improved Friday but has returned today.  It appears she did have disconjugate gaze documented on her H&P.  He has also been noting some left visual field deficits.  Discussed with neurology Dr. Guss Bunde order MRI for further evaluation  7/23 continue occlusion therapy, New area CVA noted on MRI, discussed with neurology, plt study , no additional changes at this time, appreciate assistance  -7/25 a little improved today, continue to monitor  LOS: 9 days A  FACE TO FACE EVALUATION WAS PERFORMED  Fanny Dance 08/09/2022, 8:23 AM

## 2022-08-10 LAB — BASIC METABOLIC PANEL
Anion gap: 10 (ref 5–15)
Calcium: 8.6 mg/dL — ABNORMAL LOW (ref 8.9–10.3)

## 2022-08-10 MED ORDER — PANTOPRAZOLE SODIUM 40 MG PO TBEC
40.0000 mg | DELAYED_RELEASE_TABLET | Freq: Every day | ORAL | Status: DC
  Administered 2022-08-10 – 2022-08-17 (×8): 40 mg via ORAL
  Filled 2022-08-10 (×8): qty 1

## 2022-08-10 NOTE — NC FL2 (Signed)
[place] MEDICAID FL2 LEVEL OF CARE FORM     IDENTIFICATION  Patient Name: Melissa Colon Birthdate: 04/26/1960 Sex: female Admission Date (Current Location): 07/31/2022  [place] and [place] Number:  [facility] [id number] m Facility and Address:  [facility], [address], [place], [place] [address]      Provider Number:    Attending Physician Name and Address:  Erick Colace, MD  Relative Name and Phone Number:  [name] [phone]    Current Level of Care: Hospital Recommended Level of Care: Skilled Nursing Facility Prior Approval Number:    Date Approved/Denied:   PASRR Number: [id number]  Discharge Plan: SNF    Current Diagnoses: Patient Active Problem List   Diagnosis Date Noted   Anemia 08/06/2022   Acute ischemic right MCA stroke (HCC) 07/31/2022   Stroke (cerebrum) (HCC) 07/28/2022   Right sided weakness 07/27/2022   Protein-calorie malnutrition, severe 12/16/2019   Hypertensive emergency 12/11/2019   Alcohol dependence (HCC) 12/11/2019   Tobacco use disorder 12/11/2019   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 12/10/2019    Orientation RESPIRATION BLADDER Height & Weight     Self, Time, Situation, Place  Normal Incontinent Weight: 156 lb 1.4 oz (70.8 kg) Height:  5\' 2"  (157.5 cm)  BEHAVIORAL SYMPTOMS/MOOD NEUROLOGICAL BOWEL NUTRITION STATUS      Incontinent Diet  AMBULATORY STATUS COMMUNICATION OF NEEDS Skin   Extensive Assist Verbally Normal                       Personal Care Assistance Level of Assistance  Bathing, Feeding, Dressing Bathing Assistance: Limited assistance Feeding assistance: Limited assistance Dressing Assistance: Limited assistance     Functional Limitations Info             SPECIAL CARE FACTORS FREQUENCY  PT (By licensed PT), OT (By licensed OT)     PT Frequency: 5x a week OT Frequency: 5x a week            Contractures Contractures Info: Not  present    Additional Factors Info  Code Status Code Status Info: FULL             Current Medications (08/10/2022):  This is the current hospital active medication list Current Facility-Administered Medications  Medication Dose Route Frequency Provider Last Rate Last Admin   acetaminophen (TYLENOL) tablet 1,000 mg  1,000 mg Oral TID Elijah Birk C, DO   1,000 mg at 08/10/22 0914   aspirin EC tablet 81 mg  81 mg Oral Daily Charlton Amor, PA-C   81 mg at 08/10/22 0914   atorvastatin (LIPITOR) tablet 80 mg  80 mg Oral Daily Charlton Amor, PA-C   80 mg at 08/10/22 3151   diclofenac Sodium (VOLTAREN) 1 % topical gel 2 g  2 g Topical QID Fanny Dance, MD   2 g at 08/10/22 1106   docusate sodium (COLACE) capsule 100 mg  100 mg Oral Daily Erick Colace, MD   100 mg at 08/10/22 0914   enoxaparin (LOVENOX) injection 40 mg  40 mg Subcutaneous Daily Charlton Amor, PA-C   40 mg at 08/10/22 0915   folic acid (FOLVITE) tablet 1 mg  1 mg Oral Daily Charlton Amor, PA-C   1 mg at 08/10/22 7616   meclizine (ANTIVERT) tablet 25 mg  25 mg Oral TID PRN Fanny Dance, MD       melatonin tablet 5 mg  5 mg Oral  QHS PRN Charlton Amor, PA-C   5 mg at 08/01/22 2121   methocarbamol (ROBAXIN) tablet 750 mg  750 mg Oral Q8H PRN Elijah Birk C, DO       multivitamin with minerals tablet 1 tablet  1 tablet Oral Daily Charlton Amor, PA-C   1 tablet at 08/10/22 7829   nystatin (MYCOSTATIN/NYSTOP) topical powder   Topical BID Charlton Amor, PA-C   Given at 08/10/22 5621   polyethylene glycol (MIRALAX / GLYCOLAX) packet 17 g  17 g Oral Daily PRN Angiulli, Mcarthur Rossetti, PA-C       senna-docusate (Senokot-S) tablet 1 tablet  1 tablet Oral QHS Fanny Dance, MD   1 tablet at 08/09/22 2021   thiamine (VITAMIN B1) tablet 100 mg  100 mg Oral Daily Charlton Amor, PA-C   100 mg at 08/10/22 3086   ticagrelor (BRILINTA) tablet 90 mg  90 mg Oral BID Harolyn Rutherford, MD   90  mg at 08/10/22 5784     Discharge Medications: Please see discharge summary for a list of discharge medications.  Relevant Imaging Results:  Relevant Lab Results:   Additional Information SS: [id number]  Andria Rhein
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
## 2022-08-10 NOTE — Progress Notes (Signed)
PROGRESS NOTE   Subjective/Complaints: No acute events.  Denies any motor or sensory changes for the past few days.  She is anxious because she cannot find a pair of earrings and she thinks she left them at MRI possibly.  I discussed with nursing who will attempt to call MRI to see if earrings in this location.  No additional concerns.  LBM 7/26  ROS- neg for fevers, chills, rash abdominal pain CP, SOB, N/V/D, new motor or sensory changes +constipation-improved , double vision  Objective:   DG Abd 2 Views  Result Date: 08/09/2022 CLINICAL DATA:  Constipation EXAM: ABDOMEN - 2 VIEW COMPARISON:  Abdominal x-ray 06/16/2022 FINDINGS: The bowel gas pattern is normal. Stool burden is moderate. There is no evidence of free air. No radio-opaque calculi or other significant radiographic abnormality is seen. IMPRESSION: Negative. Electronically Signed   By: Darliss Cheney M.D.   On: 08/09/2022 20:53   DG Elbow 2 Views Right  Result Date: 08/08/2022 CLINICAL DATA:  Pain aggravated by exercise EXAM: RIGHT ELBOW - 2 VIEW COMPARISON:  None Available. FINDINGS: No evidence of acute fracture or dislocation. No elbow joint effusion. Demineralization. Mild degenerative changes about the elbow. IMPRESSION: No acute fracture or dislocation. Electronically Signed   By: Minerva Fester M.D.   On: 08/08/2022 18:51   Recent Labs    08/09/22 0635 08/10/22 0532  WBC 6.8 7.2  HGB 10.6* 10.0*  HCT 32.9* 31.8*  PLT 338 346    Recent Labs    08/10/22 0532  NA 140  K 4.2  CL 110  CO2 20*  GLUCOSE 103*  BUN 18  CREATININE 0.59  CALCIUM 8.6*     Intake/Output Summary (Last 24 hours) at 08/10/2022 1610 Last data filed at 08/10/2022 0700 Gross per 24 hour  Intake 633 ml  Output --  Net 633 ml        Physical Exam: Vital Signs Blood pressure (!) 148/76, pulse 94, temperature 98.7 F (37.1 C), temperature source Oral, resp. rate 18, height 5'  2" (1.575 m), weight 70.8 kg, SpO2 100%.   General: No acute distress.  Mood and affect are appropriate Heart: Regular rate and rhythm no rubs murmurs or extra sounds Lungs: Clear to auscultation, breathing unlabored, no rales or wheezes Abdomen: Positive bowel sounds, soft nontender to palpation, nondistended Extremities: No clubbing, cyanosis, or edema Skin: warm and dry Neurologic: Cranial nerves II through XII grossly intact, delayed responses Moving all 4 extremities antigravity and against resistance  Mild dysarthria  Mood appropriate and pleasant today Disconjugate gaze , tape on glasses on L Continues to have L visual field deficits Prior exams: motor strength is 5/5 in left ,  4-/5 Right deltoid, bicep, tricep, grip,  4/5 RIght and 5/5 left hip flexor, knee extensors, ankle dorsiflexor and plantar flexor Increase tone MAS 2 RIght finger flexors - stable Sensory exam normal sensation to light touch  bilateral upper and lower extremities- except Right hand and wrist  No evidence of visual field cut Mild dysarthria , no word finding issues  Musculoskeletal: Full range of motion in all 4 extremities. No joint swelling   Assessment/Plan: 1. Functional deficits which require 3+  hours per day of interdisciplinary therapy in a comprehensive inpatient rehab setting. Physiatrist is providing close team supervision and 24 hour management of active medical problems listed below. Physiatrist and rehab team continue to assess barriers to discharge/monitor patient progress toward functional and medical goals  Care Tool:  Bathing    Body parts bathed by patient: Chest, Abdomen, Front perineal area, Right upper leg, Left upper leg, Face, Right arm   Body parts bathed by helper: Buttocks, Left arm, Left lower leg, Right lower leg     Bathing assist Assist Level: Moderate Assistance - Patient 50 - 74%     Upper Body Dressing/Undressing Upper body dressing   What is the patient  wearing?: Pull over shirt    Upper body assist Assist Level: Minimal Assistance - Patient > 75%    Lower Body Dressing/Undressing Lower body dressing      What is the patient wearing?: Pants, Underwear/pull up     Lower body assist Assist for lower body dressing: Moderate Assistance - Patient 50 - 74%     Toileting Toileting Toileting Activity did not occur Press photographer and hygiene only): Refused  Toileting assist Assist for toileting: Maximal Assistance - Patient 25 - 49%     Transfers Chair/bed transfer  Transfers assist  Chair/bed transfer activity did not occur: Safety/medical concerns  Chair/bed transfer assist level: Moderate Assistance - Patient 50 - 74% Chair/bed transfer assistive device: Armrests, Bedrails   Locomotion Ambulation   Ambulation assist   Ambulation activity did not occur: Safety/medical concerns          Walk 10 feet activity   Assist  Walk 10 feet activity did not occur: Safety/medical concerns        Walk 50 feet activity   Assist Walk 50 feet with 2 turns activity did not occur: Safety/medical concerns         Walk 150 feet activity   Assist Walk 150 feet activity did not occur: Safety/medical concerns         Walk 10 feet on uneven surface  activity   Assist Walk 10 feet on uneven surfaces activity did not occur: Safety/medical concerns         Wheelchair     Assist Is the patient using a wheelchair?: Yes Type of Wheelchair: Manual    Wheelchair assist level: Dependent - Patient 0%      Wheelchair 50 feet with 2 turns activity    Assist        Assist Level: Dependent - Patient 0%   Wheelchair 150 feet activity     Assist      Assist Level: Dependent - Patient 0%   Blood pressure (!) 148/76, pulse 94, temperature 98.7 F (37.1 C), temperature source Oral, resp. rate 18, height 5\' 2"  (1.575 m), weight 70.8 kg, SpO2 100%.  Medical Problem List and Plan: 1. 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  Antithrombotics: -DVT/anticoagulation:  Pharmaceutical: Lovenox             -antiplatelet therapy: Aspirin 81 mg daily and Plavix 75 mg daily x 90 days then aspirin alone 3. Pain Management: Tylenol as needed   - 7/20: schedule tylenol 1000 mg TID. Has PRN robaxin.    - 7/21-6: no c/o pain; monitor  4. Mood/Behavior/Sleep: Provide emotional support             -antipsychotic agents: N/A 5. Neuropsych/cognition: This patient is capable of making decisions on her own behalf. 6. Skin/Wound Care: Routine skin checks 7. Fluids/Electrolytes/Nutrition: Routine in and outs with follow-up chemistries 8.  Permissive hypertension.  Monitor with increased mobility    08/10/2022    5:49 AM 08/09/2022    7:43 PM 08/09/2022    2:12 PM  Vitals with BMI  Systolic 148 131 086  Diastolic 76 71 67  Pulse 94 91 93   7/22-5 Normotensive off medications, continue to monitor BP -7/25 okay with permissive hypertension per neurology if BP less than 220/120, gradually decrease over 5 to 7 days, long-term goal normotensive 7/26 BP overall controlled, continue to monitor  9.  Hyperlipidemia.  Lipitor Lipid Panel     Component Value Date/Time   CHOL 184 07/28/2022 0523   TRIG 130 07/28/2022 0523   HDL 35 (L) 07/28/2022 0523   CHOLHDL 5.3 07/28/2022 0523   VLDL 26 07/28/2022 0523   LDLCALC 123 (H) 07/28/2022 0523    10.   History of alcohol tobacco use.  Counseling, nicoderm patch  11.  Overweight  BMI 28.47.  Dietary follow-up 12.  Medical noncompliance.  Counseling- doesn't like to take meds, esp HTN meds.   13.  Straining to have BM, will add colace    - LBM 7/23, pt reports still feeling constipated, will add senokot  -7/25 patient had BM yesterday however still feels constipated, will check abdominal x-ray `-7/26, last BM this morning.  X-ray yesterday with moderate stool burden  14.  Hypokalemia s/p supplementation recheck K + in am  - 3.2 7/19   - Add kdur 20 mg daily for 2 days, recheck Monday  -7/23 K+ stable at 4.2 15. Anemia  -7/23 HGB 11.4 this AM, a little decreased from prior, recheck  -Thursday, check occult blood -stool, recheck CBC later in the week -7/25 Hgb down to 10.6, will ask nursing to check occult stool with next BM 7/26 hemoglobin down to 10 today, Hemoccult not checked this morning however discussed with nursing with plan to put a sign up in her room and get Hemoccult with next BM -start PPI today, continue to monitor CBC.  Consider stopping Lovenox.  Consider GI consult if continues to worsen   16. Dizziness/double vision intermittent. Pt reports this has been going on since CVA  -Can try occlusion, will order meclizine 25mg  TID PRN  -7/23 discussed with therapy.  Patient had double vision last week that improved Friday but has returned today.  It appears she did have disconjugate gaze documented on her H&P.  He has also been noting some left visual field deficits.  Discussed with neurology Dr. Guss Bunde order MRI for further evaluation  7/23 continue occlusion therapy, New area CVA noted on MRI, discussed with neurology, plt study , no additional changes at this time, appreciate assistance  -7/26 continues to have visual deficits, however dizziness is a little improved  LOS: 10 days A FACE TO FACE EVALUATION WAS PERFORMED  Fanny Dance 08/10/2022, 8:21  AM

## 2022-08-10 NOTE — Progress Notes (Signed)
Physical Therapy Session Note  Patient Details  Name: Melissa Colon MRN: [id number] Date of Birth: 1960-12-19  Today's Date: 08/10/2022 PT Individual Time: 2956-2130 + [id number] PT Individual Time Calculation (min): 41 min + 62 min  Short Term Goals: Week 2:  PT Short Term Goal 1 (Week 2): Pt will perform supine<>sit using bed features per home set-up with supervision consistently PT Short Term Goal 2 (Week 2): Pt will perform bed<>chair transfers using LRAD with no more than mod assist consistently PT Short Term Goal 3 (Week 2): Pt will perform sit<>stands using LRAD with no more than mod assist consistently PT Short Term Goal 4 (Week 2): Pt will perform L hemi-propulsion w/c mobility at least 41ft with supervision  Skilled Therapeutic Interventions/Progress Updates:     SESSION 1: Pt presents in room in bed, agreeable to PT. Pt denies pain at this time. Session focused on bed mobility/transfer training and WC mobility training to improve motor planning for hemipropulsion technique.  Pt completes supine to sit EOB using hospital bed features (bed rail and elevated HOB) with supervision. Pt requires total assist for donning ASO to RLE and for donning bilateral shoes. Pt completes stand pivot transfer to L with mod assist, pt using LUE to reach for L WC armrest to promote improved anterior wt shift. Pt then completes x1 stand to adjust positioning and clothing however uses LUE on L WC armrest and demonstrates difficulty with anterior wt shift requiring mod/max assist for maintaining standing to prevent posterior LOB, blocking R knee.  Pt attempts self propulsion in WC from room to hallway outside main gym ~150' with mod assist, LLE only, difficulty reaching floor with LLE. Pt completes stand to handrail in hallway with min assist to remove cushion and improve pt ability to use hemi technique with LLE for WC propulsion. Pt then self propels another 300' with multiple turns, using LLE, attempt  to use LUE with min/mod assist to initiate movement, min assist to maintain momentum. Pt educated on hemi technique with LUE/LLE however pt demonstrates R path deviation and difficulty motor planning steering with LLE as well as pushing with LUE.  Pt returns to room and remains seated in [facility] with all needs within reach, cal light in place and chair alarm donned and activated at end of session.  SESSION 2: Pt presents in room in [facility], handoff from OT, family (pt son and brother in law) present in the room for family training. Session focused on family training for DC planning including transfer training, discussion on ramp installation, custom WC, and bed set up at home, WC mobility training, and gait training.  Transfer training initiated with pt son [name] who reports he has been helping with transfers at home. Pt completes x2 squat pivot/stand pivot transfers using hospital bed rail, completed to L and R with mod assist. Therapist providing verbal cues for body mechanics, blocking R knee, and positioning of pt and WC. Pt then completes x2 stand pivot transfers with pt son with pt son demonstrating independence, no verbal cues required for positioning or technique.   Discussion completed with pt son and brother in law about bed height with pt bed 22" tall however will need to confirm as pt brother in law believes bed to be taller.   Discussion completed on bathroom layout and positioning WC with pt brother in law showing pictures of bathroom with enough room noted for manuevering WC and discussing safe transfer on/off toilet with managing pants.   Pt reports that she  does not usually propel in WC in home due to weight of chair and carpet, discussion completed on custom WC for more appropriate fit as well as decreased weight to improve pt's participation and willingness to complete mobility in the home.   Discussion completed on ramp installation and difficulty placing ramp for front of house due to ADA  requirements for ramp length and landing size with pt brother in law reporting that per ADA and city requirements front porch will have to be redone to allow for adequate landing size and ramp will be too long for height of steps. Back steps were considered with pt brother in law reporting that the landing in the back of the house is larger, the biggest issue is that there is no driveway next to the back steps however discussed possibly building the ramp out towards the driveway with pt brother in law agreeable to pursuing this possibility for ramp installation. Importance of ramp reiterated for emergency situations as well as for allowing for improved easy of mobility in and out of house for community engagement with pt and family verbalizing understanding.  Pt brother in law requesting to speak with SW Trula Ore, therapist communicates with her during session and she presents during session.  Pt then completes WC mobility with BLEs, difficulty managing due to height of cushion, stand completed to remove cushion with improved ability to utilize LE for [facility] propulsion however with impaired motor planning to allow sufficient propulsion with RLE as well. Pt attempts to self propel 9' with L hemi technique min/mod assist however continues to demonstrate difficulty motor planning coordination with LUE/LLE and defers to using LLE only. Education and cues provided throughout.  Pt transported to hallway outside of main gym and positioned next to handrail on L side. Pt completes sit<>stand to L hand rail min assist and ambulates 12' with mod assist for postural stability and blocking knee for RLE stance phase, improved foot placement noted for RLE advance phase. 2nd person WC follow for safety.  Pt returns to sitting and transported back to room. Pt remains seated in WC with all needs within reach, cal light in place and pt family members at side at end of session.   Therapy Documentation Precautions:   Precautions Precautions: Fall Precaution Comments: right UE and LE hemiparesis. Restrictions Weight Bearing Restrictions: No    Therapy/Group: Individual Therapy  Edwin Cap PT, DPT 08/10/2022, 12:30 PM

## 2022-08-10 NOTE — Progress Notes (Signed)
Occupational Therapy Session Note  Patient Details  Name: Melissa Colon MRN: [id number] Date of Birth: [birth year]  Today's Date: 08/10/2022 OT Individual Time: 1000-1108 OT Individual Time Calculation (min): 68 min  Session 2: OT Individual Time: 4098-1191 OT Individual Time Calculation (min): 42 min   Short Term Goals: Week 2:  OT Short Term Goal 1 (Week 2): Pt will utilize compensatory strategies for diplopia with min verbal cues OT Short Term Goal 2 (Week 2): Pt will complete toileting mod A OT Short Term Goal 3 (Week 2): Pt will attend to R visual field/ R hemi-body with min verbal cues consistently  Skilled Therapeutic Interventions/Progress Updates:     Pt received sitting up in wc upon OT arrival. Scheduled family education during this session, however Pt's family not yet present in hospital. Called Pt's family at beginning of session with Pt's family reporting they were running late and will be at the hospital shortly. Pt presenting to be in good spirits receptive to skilled OT session reporting 0/10 pain- OT offering intermittent rest breaks, repositioning, and therapeutic support to optimize participation in therapy session. Pt dressed and ready for the day upon OT arrival with all BADL needs met. Pt requesting to use restroom at beginning of session. Transported Pt total A into the bathroom in wc. Pt complete squat pivot transfer to R onto BSC placed over toilet using grab bars heavy min A. Pt requiring two attempts to complete transfer d/t Pt becoming fearful and returning to siting in wc during first attempt. Pt stood with mod A while OT doffed clothing. Pt provided increased time on toilet for BM, however no BM at this time continent void documented in flowsheets. Pt able to perform anterior peri-care while seated. Stood with mod A and L UE support on grab bar while OT brought Pt's pants to waist. Squat pivot to L stronger side min A with increased time provided to increase Pt's  safety and OT stabilizing R LE to facilitate Wb'ing. Pt able to wash hands at sink with set-up assist.  Pt's family, son- [name] & brother-in-law- [name], present for remainder of session for family education. Provided education on Pt's current functional status, CVA recovery, and Pt's deficits. 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 Provided education on therapy team's recommendation for custom wc to increase Pt's independence, improve body alignment, and to prevent skin break down with Pt and Pt's caregiver receptive. Educated on recommendation to get hospital bed for Pt to increase her safety, independence, and prevent skin break down as hospital bed allows for improved repositioning vs Pt sleeping on couch. Pt's caregivers reported they do not have room for hospital bed currently, but Pt's bed at home is only 24" off the ground and they have a bed rail accessible that she could use at d/c. Educated Pt and caregivers on importance of having ramp for outside to allow Pt to safely exit house in case of emergency, decrease bourdon of care, and increase Pt's safety. Discussed option of reaching out to church to decrease costs and engaged Pt's family members in problem solving through best place for ramp of front vs back entrance of house. Determined that putting a ramp at the back entrance that is long d/t the height of the stairs and that meets up with the drive way would be the best option. Pt's family more receptive today to getting a ramp compared to previous sessions. At end  of session, pt's caregivers reporting psychosocial concerns with Pt as Pt will refuse help at home and become combative towards her caregivers which are behaviors she has not presented with during hospital stay. Discussed option of receiving psychosocial support at d/c and provided therapeutic support with Pt's caregivers receptive. Pt was handed off  to PT for next session with all immediate needs met.   PM Session:  Pt received sitting up in wc presenting to be in good spirits receptive to skilled OT session reporting 0/10 pain- OT offering intermittent rest breaks, repositioning, and therapeutic support to optimize participation in therapy session. Pt's new foot brace [employer] dropped off during therapy session. OT provided education on purpose, how to don/doff, and reason for recommendation to increase stability of ankle. Pt receptive to trial brace during therapy session and reporting no pain with improved stability noted. Pt transported total A to therapy gym in wc for time management. Worked on completing sit<>stands using RW with focus on body mechanics, safety awareness, and hand positioning. Pt able to complete 4 stands this session with heavy min A and OT stabilizing R LE to prevent buckling and to facilitate Wb'ing through R LE. Pt able to maintain standing balance ~1-1.5 minutes at at time. In standing, worked on posterior reaching to Acupuncturist and posterior peri-care during toileting/dressing tasks. Pt able to briefly release L UE from RW, however Pt becomes extremely fearful of falling and quickly places hand back on RW. Remainder of session focused on engaging Pt in medium sized beg board activity using R UE to improve overall function use with Pt able to place/remove 10/10 pegs in board while seated with maximal effort and increased time. Transported Pt back to room total A in wc for time management. Pt was left resting in c with call bell in reach, seat belt alarm on, and all needs met.    Therapy Documentation Precautions:  Precautions Precautions: Fall Precaution Comments: right UE and LE hemiparesis. Restrictions Weight Bearing Restrictions: No   Therapy/Group: Individual Therapy  Army Fossa 08/10/2022, 8:03 AM

## 2022-08-11 NOTE — Progress Notes (Signed)
Physical Therapy Session Note  Patient Details  Name: Melissa Colon MRN: [id number] Date of Birth: 1960-04-02  Today's Date: 08/11/2022 PT Individual Time: 0925-1023 PT Individual Time Calculation (min): 58 min   Short Term Goals: Week 2:  PT Short Term Goal 1 (Week 2): Pt will perform supine<>sit using bed features per home set-up with supervision consistently PT Short Term Goal 2 (Week 2): Pt will perform bed<>chair transfers using LRAD with no more than mod assist consistently PT Short Term Goal 3 (Week 2): Pt will perform sit<>stands using LRAD with no more than mod assist consistently PT Short Term Goal 4 (Week 2): Pt will perform L hemi-propulsion w/c mobility at least 52ft with supervision  Skilled Therapeutic Interventions/Progress Updates:    Pt received sitting in w/c and agreeable to therapy session. Therapist discussed how, per chart review, it sounds like family is in agreement with pt receiving a hospital bed and ramp for home entry if pt is to D/C home. Pt's family has brought in size 7 tennis shoes; however, due to it being a different brand they are still too small. Therapist educated pt on new [employer] AirSport ASO to protect from ankle inversion rolling and donned with tennis shoes total assist - pt would benefit from exchanging these tennis shoes with a different larger pair of either [employer] or similar brand to pt's original shoe and in a size 7 or 7.5 Family has not yet been able to bring in pt's personal w/c to determine if it is appropriate for pt to continue using.  Block practice squat pivot w/c<>EOB x2 using LUE support on bedrail - requires cuing to scoot hips forward prior to initiating transfer, therapist has to ensure safe placement of R UE prior to initiating transfer as well as ensure proper positioning of R LE and providing manual facilitation to initiate WBing through R LE to keep it planted otherwise it will start to move in unsafe position when pt  initiates the transfer - requires heavy min assist for lifting/pivoting hips with cuing for head/hips relationship. Removed cushion in w/c to allow pt to practice L LE w/c propulsion; however, determined pt would benefit from different wheelchair with lower floor-to-seat height.   Transported to/from gym in w/c for time management and energy conservation.  Pt requesting to trial power wheelchair. Therapist educated pt on the following concerns regarding her ability to safely drive a power wheelchair including: diplopia, R inattention, delayed response time, impaired awareness, impaired motor planning. Pt then receptive to education and states she wants a power w/c to allow her independent mobility but understands therapist's concerns and therefore is in agreement to trial a different manual wheelchair first. If time allows and is appropriate, will trial power w/c to allow pt to experience these impairments impacting safe power w/c mobility.   Therapist retrieved pt a K5 w/c with lower floor to seat height to attempt L LE w/c propulsion. R squat pivot w/c>EOM>new w/c with min/mod assist for pivoting hips at this time due to pt in new environment with some difficulty carrying over motor plan from her room.  Donned dysem to pt's L shoe to improve grip on floor (due to new shoes having slicker bottom), but pt still with difficulty getting sufficient traction to propel; therefore, requiring min/mod assist. Also, pt needs slightly lower floor to seat height also. Will have to find a balance between floor-to-seat height to allow propulsion but still also allow safe and more independent transfers.  At end of session,  pt left seated in w/c with needs in reach, RUE supported on pillow, and seat belt alarm on.    Therapy Documentation Precautions:  Precautions Precautions: Fall Precaution Comments: right UE and LE hemiparesis. Restrictions Weight Bearing Restrictions: No   Pain:  No reports of pain  throughout session.   Therapy/Group: Individual Therapy  Ginny Forth , PT, DPT, NCS, CSRS 08/11/2022, 7:53 AM

## 2022-08-11 NOTE — Consult Note (Addendum)
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  The MRI showed an acute infact in the medial, inferior, right occipital lobe.  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  Plavix was initiated as well as Lovenox, and the latter was for DVT prophylaxis.  While in Rehab she was noted to have gradual decline of her HGB.  She dropped down from a recent baseline of 12-14 g/dL to 9.7 g/dL.  Her stool was positive for blood.  Past Medical History:  Diagnosis Date   Alcohol dependence (HCC)    H/O brain tumor    Smoker     Past Surgical History:  Procedure Laterality Date   CRANIOTOMY FOR TUMOR      Family History  Problem Relation Age of Onset   Diabetes Mother     Social History:  reports that she has been smoking cigarettes. She has never used smokeless tobacco. She reports current alcohol use of about 8.0 standard drinks of alcohol per week. She reports that she does not currently use drugs.  Allergies: No Known Allergies  Medications: Scheduled:  acetaminophen  1,000 mg Oral TID   aspirin EC  81 mg Oral Daily   atorvastatin  80 mg Oral Daily   diclofenac Sodium  2 g Topical QID   docusate sodium  100 mg Oral Daily   enoxaparin (LOVENOX) injection  40 mg Subcutaneous Daily   folic acid  1 mg Oral Daily   multivitamin with minerals  1 tablet Oral Daily   nystatin   Topical BID   pantoprazole  40 mg Oral Daily   senna-docusate  1 tablet Oral QHS   thiamine  100 mg Oral Daily   ticagrelor  90 mg Oral BID   Continuous:  Results for orders placed or performed during the hospital encounter of 07/31/22 (from the past 24 hour(s))  CBC     Status: Abnormal   Collection Time: 08/11/22  5:55 AM  Result  Value Ref Range   WBC 8.2 4.0 - 10.5 K/uL   RBC 3.52 (L) 3.87 - 5.11 MIL/uL   Hemoglobin 9.7 (L) 12.0 - 15.0 g/dL   HCT 09.8 (L) 11.9 - 14.7 %   MCV 86.4 80.0 - 100.0 fL   MCH 27.6 26.0 - 34.0 pg   MCHC 31.9 30.0 - 36.0 g/dL   RDW 82.9 56.2 - 13.0 %   Platelets 356 150 - 400 K/uL   nRBC 0.0 0.0 - 0.2 %  Occult blood card to lab, stool     Status: Abnormal   Collection Time: 08/11/22  4:00 PM  Result Value Ref Range   Fecal Occult Bld POSITIVE (A) NEGATIVE     No results found.  ROS:  As stated above in the HPI otherwise negative.  Blood pressure (!) 115/59, pulse (!) 108, temperature 97.9 F (36.6 C), resp. rate 17, height 5\' 2"  (1.575 m), weight 70.8 kg, SpO2 96%.    PE: Gen: NAD, Alert and Oriented HEENT:  [place]/AT, EOMI Neck: Supple, no LAD Lungs: CTA Bilaterally CV: RRR without M/G/R ABD: Soft, NTND, +BS Ext: No C/C/E  Assessment/Plan: 1) Anemia. 2) Heme positive stool. 3) Recent CVA.   An EGD is required.  She requested  that her brother-in-law be informed about this procedure.  I spoke with Melissa Colon, [phone], and he agrees with the EGD.  She will maintain her anticoagulation as this will aid in localizing any bleeding source.  Plan: 1) EGD tomorrow.  , D 08/11/2022, 6:06 PM

## 2022-08-11 NOTE — Progress Notes (Signed)
PROGRESS NOTE   Subjective/Complaints: Patient is Lovenox this morning.  New concerns.  Reports she had a bowel movement this morning.  LBM 7/26  ROS- neg for fevers, chills, rash abdominal pain CP, SOB, N/V/D, new motor or sensory changes, Headache +constipation-improved , double vision   Objective:   DG Abd 2 Views  Result Date: 08/09/2022 CLINICAL DATA:  Constipation EXAM: ABDOMEN - 2 VIEW COMPARISON:  Abdominal x-ray 06/16/2022 FINDINGS: The bowel gas pattern is normal. Stool burden is moderate. There is no evidence of free air. No radio-opaque calculi or other significant radiographic abnormality is seen. IMPRESSION: Negative. Electronically Signed   By: Darliss Cheney M.D.   On: 08/09/2022 20:53   Recent Labs    08/10/22 0532 08/11/22 0555  WBC 7.2 8.2  HGB 10.0* 9.7*  HCT 31.8* 30.4*  PLT 346 356    Recent Labs    08/10/22 0532  NA 140  K 4.2  CL 110  CO2 20*  GLUCOSE 103*  BUN 18  CREATININE 0.59  CALCIUM 8.6*     Intake/Output Summary (Last 24 hours) at 08/11/2022 1532 Last data filed at 08/11/2022 1328 Gross per 24 hour  Intake 474 ml  Output --  Net 474 ml        Physical Exam: Vital Signs Blood pressure (!) 115/59, pulse (!) 108, temperature 97.9 F (36.6 C), resp. rate 17, height 5\' 2"  (1.575 m), weight 70.8 kg, SpO2 96%.   General: No acute distress.  Mood and affect are appropriate Heart: Regular rate and rhythm no rubs murmurs or extra sounds Lungs: Clear to auscultation, breathing unlabored, no rales or wheezes Abdomen: Positive bowel sounds, soft nontender to palpation, nondistended Extremities: No clubbing, cyanosis, or edema Skin: warm and dry Neurologic: Sleeping however wakes easily to voice, cranial nerves II through XII grossly intact, delayed responses-improved Moving all 4 extremities antigravity and against resistance  Mild dysarthria  Mood appropriate and pleasant  today Disconjugate gaze , tape on glasses on L Continues to have L visual field deficits Prior exams: motor strength is 5/5 in left ,  4-/5 Right deltoid, bicep, tricep, grip,  4/5 RIght and 5/5 left hip flexor, knee extensors, ankle dorsiflexor and plantar flexor Increase tone MAS 2 RIght finger flexors - stable Sensory exam normal sensation to light touch  bilateral upper and lower extremities- except Right hand and wrist  No evidence of visual field cut Mild dysarthria , no word finding issues  Musculoskeletal: Full range of motion in all 4 extremities. No joint swelling   Assessment/Plan: 1. Functional deficits which require 3+ hours per day of interdisciplinary therapy in a comprehensive inpatient rehab setting. Physiatrist is providing close team supervision and 24 hour management of active medical problems listed below. Physiatrist and rehab team continue to assess barriers to discharge/monitor patient progress toward functional and medical goals  Care Tool:  Bathing    Body parts bathed by patient: Chest, Abdomen, Front perineal area, Right upper leg, Left upper leg, Face, Right arm   Body parts bathed by helper: Buttocks, Left arm, Left lower leg, Right lower leg     Bathing assist Assist Level: Moderate Assistance - Patient 50 -  74%     Upper Body Dressing/Undressing Upper body dressing   What is the patient wearing?: Pull over shirt    Upper body assist Assist Level: Minimal Assistance - Patient > 75%    Lower Body Dressing/Undressing Lower body dressing      What is the patient wearing?: Pants, Underwear/pull up     Lower body assist Assist for lower body dressing: Moderate Assistance - Patient 50 - 74%     Toileting Toileting Toileting Activity did not occur Press photographer and hygiene only): Refused  Toileting assist Assist for toileting: Maximal Assistance - Patient 25 - 49%     Transfers Chair/bed transfer  Transfers assist  Chair/bed  transfer activity did not occur: Safety/medical concerns  Chair/bed transfer assist level: Moderate Assistance - Patient 50 - 74% Chair/bed transfer assistive device: Armrests, Bedrails   Locomotion Ambulation   Ambulation assist   Ambulation activity did not occur: Safety/medical concerns          Walk 10 feet activity   Assist  Walk 10 feet activity did not occur: Safety/medical concerns        Walk 50 feet activity   Assist Walk 50 feet with 2 turns activity did not occur: Safety/medical concerns         Walk 150 feet activity   Assist Walk 150 feet activity did not occur: Safety/medical concerns         Walk 10 feet on uneven surface  activity   Assist Walk 10 feet on uneven surfaces activity did not occur: Safety/medical concerns         Wheelchair     Assist Is the patient using a wheelchair?: Yes Type of Wheelchair: Manual    Wheelchair assist level: Dependent - Patient 0%      Wheelchair 50 feet with 2 turns activity    Assist        Assist Level: Dependent - Patient 0%   Wheelchair 150 feet activity     Assist      Assist Level: Dependent - Patient 0%   Blood pressure (!) 115/59, pulse (!) 108, temperature 97.9 F (36.6 C), resp. rate 17, height 5\' 2"  (1.575 m), weight 70.8 kg, SpO2 96%.  Medical Problem List and Plan: 1. 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  Antithrombotics: -DVT/anticoagulation:  Pharmaceutical: Lovenox -Discussed Lovenox for DVT prevention             -antiplatelet therapy: Aspirin 81 mg daily and Plavix 75 mg daily x 90 days then aspirin alone 3. Pain Management: Tylenol as needed   - 7/20: schedule tylenol 1000 mg TID. Has PRN robaxin.    - 7/21-7: no c/o pain; monitor  4. Mood/Behavior/Sleep: Provide emotional support             -antipsychotic agents: N/A 5. Neuropsych/cognition: This patient is capable of making decisions on her own behalf. 6. Skin/Wound  Care: Routine skin checks 7. Fluids/Electrolytes/Nutrition: Routine in and outs with follow-up chemistries 8.  Permissive hypertension.  Monitor with increased mobility    08/11/2022    1:01 PM 08/11/2022    4:03 AM 08/10/2022    7:37 PM  Vitals with BMI  Systolic 115 126 161  Diastolic 59 73 66  Pulse 108 101 97   7/22-5 Normotensive off medications, continue to monitor BP -7/25 okay with permissive hypertension per neurology if BP less than 220/120, gradually decrease over 5 to 7 days, long-term goal normotensive 7/27 BP controlled overall, vitals indicate tacky this morning although later in the afternoon I noted heart rate in the 90s, continue to monitor.  Encourage fluid intake  9.  Hyperlipidemia.  Lipitor Lipid Panel     Component Value Date/Time   CHOL 184 07/28/2022 0523   TRIG 130 07/28/2022 0523   HDL 35 (L) 07/28/2022 0523   CHOLHDL 5.3 07/28/2022 0523   VLDL 26 07/28/2022 0523   LDLCALC 123 (H) 07/28/2022 0523    10.  History of alcohol tobacco use.  Counseling, nicoderm patch  11.  Overweight  BMI 28.47.  Dietary follow-up 12.  Medical noncompliance.  Counseling- doesn't like to take meds, esp HTN meds.   13.  Straining to have BM, will add colace    - LBM 7/23, pt reports still feeling constipated, will add senokot  -7/25 patient had BM yesterday however still feels constipated, will check abdominal x-ray `-7/26, last BM  this morning.  X-ray yesterday with moderate stool burden  7/27 last BM this morning, continue to monitor  14.  Hypokalemia s/p supplementation recheck K + in am  - 3.2 7/19   - Add kdur 20 mg daily for 2 days, recheck Monday  -7/23 K+ stable at 4.2  -Recheck tomorrow 15. Anemia  -7/23 HGB 11.4 this AM, a little decreased from prior, recheck  -Thursday, check occult blood -stool, recheck CBC later in the week -7/25 Hgb down to 10.6, will ask nursing to check occult stool with next BM 7/26 hemoglobin down to 10 today, Hemoccult not checked this morning however discussed with nursing with plan to put a sign up in her room and get Hemoccult with next BM -start PPI today, continue to monitor CBC.  Consider stopping Lovenox.  Consider GI consult if continues to worsen 7/27 does not appear Hemoccult was done on stool , GI consult placed as I suspect this is the most likely source   16. Dizziness/double vision intermittent. Pt reports this has been going on since CVA  -Can try occlusion, will order meclizine 25mg  TID PRN  -7/23 discussed with therapy.  Patient had double vision last week that improved Friday but has returned today.  It appears she did have disconjugate gaze documented on her H&P.  He has also been noting some left visual field deficits.  Discussed with neurology Dr. Guss Bunde order MRI for further evaluation  7/23 continue occlusion therapy, New area CVA noted on MRI, discussed with neurology, plt study , no additional changes at this time, appreciate assistance  -7/26 continues to have visual deficits, however dizziness is a little improved  LOS: 11 days A FACE TO FACE EVALUATION WAS PERFORMED  Fanny Dance 08/11/2022, 3:32 PM

## 2022-08-12 ENCOUNTER — Inpatient Hospital Stay (HOSPITAL_COMMUNITY): Payer: Medicaid Other | Admitting: Certified Registered Nurse Anesthetist

## 2022-08-12 ENCOUNTER — Encounter (HOSPITAL_COMMUNITY)
Admission: RE | Disposition: A | Discharge status: Home / Self Care | Payer: Self-pay | Source: Intra-hospital | Attending: Physical Medicine & Rehabilitation

## 2022-08-12 ENCOUNTER — Encounter (HOSPITAL_COMMUNITY): Payer: Self-pay | Admitting: Physical Medicine & Rehabilitation

## 2022-08-12 DIAGNOSIS — R7989 Other specified abnormal findings of blood chemistry: Secondary | ICD-10-CM

## 2022-08-12 DIAGNOSIS — K2961 Other gastritis with bleeding: Secondary | ICD-10-CM

## 2022-08-12 DIAGNOSIS — F17210 Nicotine dependence, cigarettes, uncomplicated: Secondary | ICD-10-CM

## 2022-08-12 DIAGNOSIS — I10 Essential (primary) hypertension: Secondary | ICD-10-CM

## 2022-08-12 DIAGNOSIS — K31819 Angiodysplasia of stomach and duodenum without bleeding: Secondary | ICD-10-CM

## 2022-08-12 HISTORY — PX: ESOPHAGOGASTRODUODENOSCOPY (EGD) WITH PROPOFOL: SHX5813

## 2022-08-12 LAB — BASIC METABOLIC PANEL WITH GFR
Anion gap: 5 (ref 5–15)
BUN: 22 mg/dL — ABNORMAL HIGH (ref 6–20)
CO2: 23 mmol/L (ref 22–32)
Calcium: 8.6 mg/dL — ABNORMAL LOW (ref 8.9–10.3)
Chloride: 114 mmol/L — ABNORMAL HIGH (ref 98–111)
Creatinine, Ser: 0.63 mg/dL (ref 0.44–1.00)
GFR, Estimated: >60 mL/min (ref >60)
Glucose, Bld: 102 mg/dL — ABNORMAL HIGH (ref 70–99)
Potassium: 4.1 mmol/L (ref 3.5–5.1)
Sodium: 142 mmol/L (ref 135–145)

## 2022-08-12 LAB — CBC
HCT: 27.7 % — ABNORMAL LOW (ref 36.0–46.0)
Hemoglobin: 8.6 g/dL — ABNORMAL LOW (ref 12.0–15.0)
MCH: 27.2 pg (ref 26.0–34.0)
MCHC: 31 g/dL (ref 30.0–36.0)
MCV: 87.7 fL (ref 80.0–100.0)
Platelets: 359 10*3/uL (ref 150–400)
RBC: 3.16 MIL/uL — ABNORMAL LOW (ref 3.87–5.11)
RDW: 14.1 % (ref 11.5–15.5)
WBC: 8.9 10*3/uL (ref 4.0–10.5)
nRBC: 0 % (ref 0.0–0.2)

## 2022-08-12 SURGERY — ESOPHAGOGASTRODUODENOSCOPY (EGD) WITH PROPOFOL
Anesthesia: Monitor Anesthesia Care

## 2022-08-12 MED ORDER — LACTATED RINGERS IV SOLN
INTRAVENOUS | Status: AC | PRN
  Administered 2022-08-12: 10 mL/h via INTRAVENOUS

## 2022-08-12 MED ORDER — PROPOFOL 500 MG/50ML IV EMUL
INTRAVENOUS | Status: DC | PRN
  Administered 2022-08-12: 175 ug/kg/min via INTRAVENOUS

## 2022-08-12 MED ORDER — LIDOCAINE 2% (20 MG/ML) 5 ML SYRINGE
INTRAMUSCULAR | Status: DC | PRN
  Administered 2022-08-12: 60 mg via INTRAVENOUS

## 2022-08-12 MED ORDER — PROPOFOL 10 MG/ML IV BOLUS
INTRAVENOUS | Status: DC | PRN
  Administered 2022-08-12: 50 mg via INTRAVENOUS

## 2022-08-12 MED ORDER — SODIUM CHLORIDE 0.9 % IV SOLN: INTRAVENOUS | Status: DC

## 2022-08-12 SURGICAL SUPPLY — 15 items

## 2022-08-12 NOTE — Transfer of Care (Signed)
Immediate Anesthesia Transfer of Care Note  Patient: Melissa Colon  Procedure(s) Performed: ESOPHAGOGASTRODUODENOSCOPY (EGD) WITH PROPOFOL  Patient Location: PACU  Anesthesia Type:General  Level of Consciousness: sedated  Airway & Oxygen Therapy: Patient Spontanous Breathing  Post-op Assessment: Report given to RN and Post -op Vital signs reviewed and stable  Post vital signs: Reviewed and stable  Last Vitals:  Vitals Value Taken Time  BP 88/51 08/12/22 1410  Temp    Pulse 90 08/12/22 1411  Resp 20 08/12/22 1411  SpO2 97 % 08/12/22 1411  Vitals shown include unfiled device data.  Last Pain:  Vitals:   08/12/22 1332  TempSrc: Temporal  PainSc: 0-No pain      Patients Stated Pain Goal: 0 (08/03/22 1947)  Complications: No notable events documented.

## 2022-08-12 NOTE — Anesthesia Preprocedure Evaluation (Addendum)
Anesthesia Evaluation  Patient identified by MRN, date of birth, ID band Patient awake    Reviewed: Allergy & Precautions, NPO status , Patient's Chart, lab work & pertinent test results  Airway Mallampati: III  TM Distance: >3 FB Neck ROM: Full    Dental  (+) Missing   Pulmonary Current Smoker and Patient abstained from smoking.   Pulmonary exam normal        Cardiovascular hypertension, Normal cardiovascular exam     Neuro/Psych CVA, Residual Symptoms  negative psych ROS   GI/Hepatic negative GI ROS,,,(+)     substance abuse  alcohol use  Endo/Other  negative endocrine ROS    Renal/GU negative Renal ROS     Musculoskeletal negative musculoskeletal ROS (+)    Abdominal   Peds  Hematology  (+) Blood dyscrasia (Plavix), anemia   Anesthesia Other Findings GI bleed  Reproductive/Obstetrics                             Anesthesia Physical Anesthesia Plan  ASA: 3  Anesthesia Plan: MAC   Post-op Pain Management:    Induction: Intravenous  PONV Risk Score and Plan: 1 and Propofol infusion and Treatment may vary due to age or medical condition  Airway Management Planned: Nasal Cannula  Additional Equipment:   Intra-op Plan:   Post-operative Plan:   Informed Consent: I have reviewed the patients History and Physical, chart, labs and discussed the procedure including the risks, benefits and alternatives for the proposed anesthesia with the patient or authorized representative who has indicated his/her understanding and acceptance.     Dental advisory given  Plan Discussed with: CRNA  Anesthesia Plan Comments:        Anesthesia Quick Evaluation

## 2022-08-12 NOTE — Op Note (Signed)
[facility] Patient Name: Melissa Colon Procedure Date : 08/12/2022 MRN: [id number] Attending MD: Jeani Hawking , MD, [id number] Date of Birth: September 25, 1960 CSN: [id number] Age: 62 Admit Type: Inpatient Procedure:                Upper GI endoscopy Indications:              Iron deficiency anemia Providers:                Jeani Hawking, MD, Margaree Mackintosh, RN,                            Rozetta Nunnery, Technician Referring MD:              Medicines:                Propofol per Anesthesia Complications:            No immediate complications. Estimated Blood Loss:     Estimated blood loss: none. Procedure:                Pre-Anesthesia Assessment:                           - Prior to the procedure, a History and Physical                            was performed, and patient medications and                            allergies were reviewed. The patient's tolerance of                            previous anesthesia was also reviewed. The risks                            and benefits of the procedure and the sedation                            options and risks were discussed with the patient.                            All questions were answered, and informed consent                            was obtained. Prior Anticoagulants: The patient has                            taken no anticoagulant or antiplatelet agents. ASA                            Grade Assessment: III - A patient with severe                            systemic disease. After reviewing the risks and  benefits, the patient was deemed in satisfactory                            condition to undergo the procedure.                           - Sedation was administered by an anesthesia                            professional. Deep sedation was attained.                           After obtaining informed consent, the endoscope was                            passed under direct  vision. Throughout the                            procedure, the patient's blood pressure, pulse, and                            oxygen saturations were monitored continuously. The                            [id number]-[id number] ([id number]) Olympus endoscope was introduced                            through the mouth, and advanced to the second part                            of duodenum. The upper GI endoscopy was                            accomplished without difficulty. The patient                            tolerated the procedure well. Scope In: Scope Out: Findings:      Diffuse mucosal changes characterized by sloughing were found in the       entire esophagus - Esophagitis dissecans.      A 3 cm hiatal hernia was present.      Multiple dispersed small erosions with stigmata of recent bleeding were       found in the gastric antrum.      Two diminutive angiodysplastic lesions without bleeding were found in       the second portion of the duodenum.      Esophagitis dissecans was noted, but this was not the source of       bleeding. Multiple small nonbleeding gastric antral erosions were found.       There was some evidence of mild hematin. Two small AVMs in D2 were found       and they were no bleeding. No treatment was applied as she was currently       on Brilinta. Three passes in the upper GI tract were performed, but no       bleeding was identified. 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                           - Erosive gastropathy with stigmata of recent                            bleeding.                           - Two non-bleeding angiodysplastic lesions in the                            duodenum.                           - No specimens collected. Recommendation:           - Return patient to hospital ward for ongoing care.                           - Resume regular diet.                            - Continue present medications.                           - PPI QD.                           - Follow HGB and transfuse as necessary.                           - If anemia persists a colonoscopy will be required.                           - [facility] will assume care in the AM. Procedure Code(s):        --- Professional ---                           [phone], Esophagogastroduodenoscopy, flexible,                            transoral; diagnostic, including collection of                            specimen(s) by brushing or washing, when performed                            (separate procedure) Diagnosis Code(s):        --- Professional ---                           K92.2, Gastrointestinal hemorrhage, unspecified  K22.89, Other specified disease of esophagus                           K31.819, Angiodysplasia of stomach and duodenum                            without bleeding                           D50.9, Iron deficiency anemia, unspecified CPT copyright 2022 American Medical Association. All rights reserved. The codes documented in this report are preliminary and upon coder review may  be revised to meet current compliance requirements. Jeani Hawking, MD Jeani Hawking, MD 08/12/2022 2:29:15 PM This report has been signed electronically. Number of Addenda: 0

## 2022-08-12 NOTE — Progress Notes (Signed)
Called Endoscopy to follow up patient's procedure for today. Per Hailey RN pt is scheduled at 130pm. Hold lovenox; meds with sips of water no Applesauce; no ensure. Maintain on NPO. Patient notified and asked to call her brother.  Brother was notified of the time of procedure.  Patient refused to sign consent  said she will let her brother sign consent. Telephone consent given by her brother with 2 RN witness.

## 2022-08-12 NOTE — Progress Notes (Signed)
PROGRESS NOTE   Subjective/Complaints: Patient just coming back from GI procedure.  She is hungry but no additional concerns this afternoon.  ROS- neg for fevers, abdominal pain CP, SOB, N/V/D, new motor or sensory changes, Headache +constipation-improved , double vision -unchanged  Objective:   No results found. Recent Labs    08/11/22 0555 08/12/22 0530  WBC 8.2 8.9  HGB 9.7* 8.6*  HCT 30.4* 27.7*  PLT 356 359    Recent Labs    08/10/22 0532 08/12/22 0530  NA 140 142  K 4.2 4.1  CL 110 114*  CO2 20* 23  GLUCOSE 103* 102*  BUN 18 22*  CREATININE 0.59 0.63  CALCIUM 8.6* 8.6*     Intake/Output Summary (Last 24 hours) at 08/12/2022 1533 Last data filed at 08/12/2022 1406 Gross per 24 hour  Intake 637 ml  Output --  Net 637 ml        Physical Exam: Vital Signs Blood pressure 120/73, pulse 85, temperature 97.6 F (36.4 C), resp. rate 18, height 5\' 2"  (1.575 m), weight 68 kg, SpO2 98%.   General: No acute distress.  She is a little anxious about endoscopy results Heart: Regular rate and rhythm no rubs murmurs or extra sounds Lungs: Clear to auscultation, breathing unlabored, no rales or wheezes Abdomen: Positive bowel sounds, soft nontender to palpation, nondistended Extremities: No clubbing, cyanosis, or edema Skin: warm and dry Neurologic: Alert and awake, cranial nerves II through XII grossly intact, delayed responses-improved Moving all 4 extremities antigravity Mild dysarthria  Disconjugate gaze , tape on glasses on L Continues to have L visual field deficits Musculoskeletal: Tenderness right elbow with range of motion and palpation appears to be improved today   Assessment/Plan: 1. Functional deficits which require 3+ hours per day of interdisciplinary therapy in a comprehensive inpatient rehab setting. Physiatrist is providing close team supervision and 24 hour management of active medical  problems listed below. Physiatrist and rehab team continue to assess barriers to discharge/monitor patient progress toward functional and medical goals  Care Tool:  Bathing    Body parts bathed by patient: Chest, Abdomen, Front perineal area, Right upper leg, Left upper leg, Face, Right arm   Body parts bathed by helper: Buttocks, Left arm, Left lower leg, Right lower leg     Bathing assist Assist Level: Moderate Assistance - Patient 50 - 74%     Upper Body Dressing/Undressing Upper body dressing   What is the patient wearing?: Pull over shirt    Upper body assist Assist Level: Minimal Assistance - Patient > 75%    Lower Body Dressing/Undressing Lower body dressing      What is the patient wearing?: Pants, Underwear/pull up     Lower body assist Assist for lower body dressing: Moderate Assistance - Patient 50 - 74%     Toileting Toileting Toileting Activity did not occur Press photographer and hygiene only): Refused  Toileting assist Assist for toileting: Maximal Assistance - Patient 25 - 49%     Transfers Chair/bed transfer  Transfers assist  Chair/bed transfer activity did not occur: Safety/medical concerns  Chair/bed transfer assist level: Moderate Assistance - Patient 50 - 74% Chair/bed transfer assistive device:  Armrests, Bedrails   Locomotion Ambulation   Ambulation assist   Ambulation activity did not occur: Safety/medical concerns          Walk 10 feet activity   Assist  Walk 10 feet activity did not occur: Safety/medical concerns        Walk 50 feet activity   Assist Walk 50 feet with 2 turns activity did not occur: Safety/medical concerns         Walk 150 feet activity   Assist Walk 150 feet activity did not occur: Safety/medical concerns         Walk 10 feet on uneven surface  activity   Assist Walk 10 feet on uneven surfaces activity did not occur: Safety/medical concerns         Wheelchair     Assist Is  the patient using a wheelchair?: Yes Type of Wheelchair: Manual    Wheelchair assist level: Dependent - Patient 0%      Wheelchair 50 feet with 2 turns activity    Assist        Assist Level: Dependent - Patient 0%   Wheelchair 150 feet activity     Assist      Assist Level: Dependent - Patient 0%   Blood pressure 120/73, pulse 85, temperature 97.6 F (36.4 C), resp. rate 18, height 5\' 2"  (1.575 m), weight 68 kg, SpO2 98%.  Medical Problem List and Plan: 1. 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  Antithrombotics: -DVT/anticoagulation:  Pharmaceutical: Lovenox -Discussed Lovenox for DVT prevention             -antiplatelet therapy: Aspirin 81 mg daily and Plavix 75 mg daily x 90 days then aspirin alone 3. Pain Management: Tylenol as needed   - 7/20: schedule tylenol 1000 mg TID. Has PRN robaxin.    - 7/21-8: no c/o pain; monitor  4. Mood/Behavior/Sleep: Provide emotional support             -antipsychotic agents: N/A 5. Neuropsych/cognition: This patient is capable of making decisions on her own behalf. 6. Skin/Wound Care: Routine skin checks 7. Fluids/Electrolytes/Nutrition: Routine in and outs with follow-up chemistries 8.  Permissive hypertension.  Monitor with increased mobility    08/12/2022    3:02 PM 08/12/2022    2:40 PM 08/12/2022    2:30 PM  Vitals with BMI  Systolic 120 121 161  Diastolic 73 74  78  Pulse 85 91 93   7/22-5 Normotensive off medications, continue to monitor BP -7/25 okay with permissive hypertension per neurology if BP less than 220/120, gradually decrease over 5 to 7 days, long-term goal normotensive 7/28 BP well-controlled, heart rate stable  9.  Hyperlipidemia.  Lipitor Lipid Panel     Component Value Date/Time   CHOL 184 07/28/2022 0523   TRIG 130 07/28/2022 0523   HDL 35 (L) 07/28/2022 0523   CHOLHDL 5.3 07/28/2022 0523   VLDL  26 07/28/2022 0523   LDLCALC 123 (H) 07/28/2022 0523    10.  History of alcohol tobacco use.  Counseling, nicoderm patch  11.  Overweight  BMI 28.47.  Dietary follow-up 12.  Medical noncompliance.  Counseling- doesn't like to take meds, esp HTN meds.   13.  Straining to have BM, will add colace    - LBM 7/23, pt reports still feeling constipated, will add senokot  -7/25 patient had BM yesterday however still feels constipated, will check abdominal x-ray `-7/26, last BM this morning.  X-ray yesterday with moderate stool burden  7/27 last BM this morning, continue to monitor  14.  Hypokalemia s/p supplementation recheck K + in am  - 3.2 7/19   - Add kdur 20 mg daily for 2 days, recheck Monday  -7/23 K+ stable at 4.2  -7/28 potassium stable at 4.1 15. Anemia  -7/23 HGB 11.4 this AM, a little decreased from prior, recheck  -Thursday, check occult blood -stool, recheck CBC later in the week -7/25 Hgb down to 10.6, will ask nursing to check occult stool with next BM 7/26 hemoglobin down to 10 today, Hemoccult not checked this morning however discussed with nursing with plan to put a sign up in her room and get Hemoccult with next BM -start PPI today, continue to monitor CBC.  Consider stopping Lovenox.  Consider GI consult if continues to worsen 7/27 does not appear Hemoccult was done on stool , GI consult placed as I suspect this is the most likely source 7/28 Hemoccult positive, patient had endoscopy today, small erosions with  stigmata of recent bleeding were found.  GI recommends resume regular diet continue PPI, follow hemoglobin and transfuse if necessary.  If anemia persists colonoscopy will be needed.  Appreciate assistance   16. Dizziness/double vision intermittent. Pt reports this has been going on since CVA  -Can try occlusion, will order meclizine 25mg  TID PRN  -7/23 discussed with therapy.  Patient had double vision last week that improved Friday but has returned today.  It appears she did have disconjugate gaze documented on her H&P.  He has also been noting some left visual field deficits.  Discussed with neurology Dr. Guss Bunde order MRI for further evaluation  7/23 continue occlusion therapy, New area CVA noted on MRI, discussed with neurology, plt study , no additional changes at this time, appreciate assistance  -7/27 appears stable, continue to monitor for changes 17.  Renal azotemia  -BUN up to 22 creatinine stable at 0.63.  Encourage fluid intake  LOS: 12 days A FACE TO FACE EVALUATION WAS PERFORMED  Fanny Dance 08/12/2022, 3:33 PM

## 2022-08-12 NOTE — Anesthesia Postprocedure Evaluation (Signed)
Anesthesia Post Note  Patient: Melissa Colon  Procedure(s) Performed: ESOPHAGOGASTRODUODENOSCOPY (EGD) WITH PROPOFOL     Patient location during evaluation: Endoscopy Anesthesia Type: MAC Level of consciousness: awake Pain management: pain level controlled Vital Signs Assessment: post-procedure vital signs reviewed and stable Respiratory status: spontaneous breathing, nonlabored ventilation and respiratory function stable Cardiovascular status: blood pressure returned to baseline and stable Postop Assessment: no apparent nausea or vomiting Anesthetic complications: no   No notable events documented.  Last Vitals:  Vitals:   08/12/22 1440 08/12/22 1502  BP: 121/74 120/73  Pulse: 91 85  Resp: 19 18  Temp:  36.4 C  SpO2: 100% 98%    Last Pain:  Vitals:   08/12/22 1440  TempSrc:   PainSc: 0-No pain                  P 

## 2022-08-13 DIAGNOSIS — K319 Disease of stomach and duodenum, unspecified: Secondary | ICD-10-CM

## 2022-08-13 DIAGNOSIS — K449 Diaphragmatic hernia without obstruction or gangrene: Secondary | ICD-10-CM

## 2022-08-13 DIAGNOSIS — K209 Esophagitis, unspecified without bleeding: Secondary | ICD-10-CM

## 2022-08-13 DIAGNOSIS — R195 Other fecal abnormalities: Secondary | ICD-10-CM

## 2022-08-13 DIAGNOSIS — F1090 Alcohol use, unspecified, uncomplicated: Secondary | ICD-10-CM

## 2022-08-13 DIAGNOSIS — I639 Cerebral infarction, unspecified: Secondary | ICD-10-CM

## 2022-08-13 DIAGNOSIS — H538 Other visual disturbances: Secondary | ICD-10-CM

## 2022-08-13 DIAGNOSIS — K254 Chronic or unspecified gastric ulcer with hemorrhage: Secondary | ICD-10-CM

## 2022-08-13 LAB — IRON AND TIBC
Iron: 34 ug/dL (ref 28–170)
Saturation Ratios: 9 % — ABNORMAL LOW (ref 10.4–31.8)
TIBC: 395 ug/dL (ref 250–450)
UIBC: 361 ug/dL

## 2022-08-13 LAB — FERRITIN: Ferritin: 20 ng/mL (ref 11–307)

## 2022-08-13 MED ORDER — POLYETHYLENE GLYCOL 3350 17 G PO PACK
17.0000 g | PACK | Freq: Every day | ORAL | Status: DC
  Administered 2022-08-13 – 2022-08-16 (×4): 17 g via ORAL
  Filled 2022-08-13 (×5): qty 1

## 2022-08-13 MED ORDER — SORBITOL 70 % SOLN: 30.0000 mL | Freq: Every day | Status: DC | PRN

## 2022-08-13 NOTE — Progress Notes (Signed)
Physical Therapy Session Note  Patient Details  Name: Melissa Colon MRN: [id number] Date of Birth: Feb 25, 1960  Today's Date: 08/13/2022 PT Individual Time: 1105-1200 PT Individual Time Calculation (min): 55 min   Short Term Goals: Week 2:  PT Short Term Goal 1 (Week 2): Pt will perform supine<>sit using bed features per home set-up with supervision consistently PT Short Term Goal 2 (Week 2): Pt will perform bed<>chair transfers using LRAD with no more than mod assist consistently PT Short Term Goal 3 (Week 2): Pt will perform sit<>stands using LRAD with no more than mod assist consistently PT Short Term Goal 4 (Week 2): Pt will perform L hemi-propulsion w/c mobility at least 58ft with supervision  Skilled Therapeutic Interventions/Progress Updates:     Pt received seated in [facility] and agrees to therapy. No complaint of pain. WC transport to gym for time management. Pt's 62th birthday is today and pt requests to perform Nustep. Pt performs stand step transfer to Nustep with modA and cues for foot placement, body mechanics, and positioning. Pt completes Nustep for reciprocal coordination training and strengthening. Pt completes x10:00 at workload of 5 with average steps per minute ~30. Pt requires multiple brief seated rest breaks. PT provides cues for hand and foot placement and completing full available ROM, as well as ace wrapping Rt hand to facilitate grip.   Stand pivot from Nustep to [facility] with modA and cues for foot placement, initiation, anterior weight shift, and sequencing.   Pt ambulates with left handrail outside main gym. Pt stands from [facility] with minA and cues for body mechanics. Pt ambulates 2x20' with extended seated rest break. PT provides modA to facilitate trunk stability, as well as facilitating placement of RLE during stance phase, though pt is able to advance RLE each gait cycle without assistance. Pt begins to complain of Lt ankle pain during ambulation. PT provides rest break to  manage pain. Pt has one instance of buckling through Rt knee, but otherwise is able to maintain knee stability without manual blocking of knee.   WC transport back to room. Left seated in WC with alarm intact and all needs within reach.   Therapy Documentation Precautions:  Precautions Precautions: Fall Precaution Comments: right UE and LE hemiparesis. Restrictions Weight Bearing Restrictions: No   Therapy/Group: Individual Therapy  Beau Fanny, PT, DPT 08/13/2022, 12:02 PM

## 2022-08-13 NOTE — Progress Notes (Signed)
Physical Therapy Session Note  Patient Details  Name: Melissa Colon MRN: [id number] Date of Birth: 30-Aug-1960  Today's Date: 08/13/2022 PT Individual Time: 3875-6433 + 2951-8841 PT Individual Time Calculation (min): 58 min + 42 min  Short Term Goals: Week 2:  PT Short Term Goal 1 (Week 2): Pt will perform supine<>sit using bed features per home set-up with supervision consistently PT Short Term Goal 2 (Week 2): Pt will perform bed<>chair transfers using LRAD with no more than mod assist consistently PT Short Term Goal 3 (Week 2): Pt will perform sit<>stands using LRAD with no more than mod assist consistently PT Short Term Goal 4 (Week 2): Pt will perform L hemi-propulsion w/c mobility at least 5ft with supervision  Skilled Therapeutic Interventions/Progress Updates:     SESSION 1: Pt presents in room in bed, agreeable to PT, RN present in room at start of session. Pt reports mild pain in back, RN aware but pt declines intervention at this time. Pt reporting worsening vision this AM, MD notified, requires increased verbal/tactile cues due to unable to see visual cues. Session focused on [facility] mobility training and participation with self care tasks as well as transfer training to promote improved independence with mobility.  Pt requires max assist for threading pants in supine, able to complete donning up to hips with supervision and verbal cues. Pt completes bed mobility with min assist for tactile cues to utilize hospital bed rails due to poor vision this session. Pt completes doffing/donning new shirt with min assist, increased time to allow pt to problem solve so as to improve participation with task, min cues provided. Therapist donns new aircast for RLE and bilateral shoes for time management, pt reporting increased tightness with bilateral shoes. Pt trialed different shoes (new balances) with reporting decreased tightness bilaterally. Pt completes stand with mod assist , LUE support on  hospital bed rail, pt demonstrating posterior lean requiring max assist for maintaining standing and total assist for pulling up pants in standing. Pt completes squat pivot to R with mod assist.  Pt with increased time to motivate to participate with WC mobility however pt does demonstrate improved participation and improved hemi technique for propulsion when transported to hallway outside of main gym due to decreased distractions. Pt able to maintain straight path 2x20' with increased time and verbal cueing for sequencing, supervision only. Pt unable to maintain sustained attention on task with distractions such as people walking down the hallway.  Pt transported back to room and remains seated in [facility] with all needs within reach, call light in place, and chair alarm donned and activated. Pt reporting increased "weirdness" in R foot with shoe and aircast donned, trialed taking off shoe then shoe + aircast with pt unable to report relief. Shoe and aircast left off at end of session to decrease discomfort.  Vitals: In sitting: BP 127/78    SESSION 2: Pt presents in room seated in WC, pt tearful upon entry. Pt reporting having talked with SW and pt concerned that family wants to "put her in hospice." Pt requires increased time and actively listening through majority of session to reorient to goals and education on what further placement means for rehabilitation with pt agreeable to focus on becoming more independent with transfers and WC mobility and improving participation with mobility and self care tasks at home.  Pt completes x4 squat pivot transfers with LUE support on hospital bed rail. Verbal cues provided before, during and after transfer about foot placement, anterior wt shift,  and completing turn vs sitting prematurely. Pt with improved technique with cues however continues to require mod assistn. Pt will benefit from continued mass practice for improved carryover between sessions.  Pt remains  seated in WC with all needs within reach, call light in place, and chair alarm donned and activated at end of session.  Therapy Documentation Precautions:  Precautions Precautions: Fall Precaution Comments: right UE and LE hemiparesis. Restrictions Weight Bearing Restrictions: No   Therapy/Group: Individual Therapy  Edwin Cap PT, DPT 08/13/2022, 12:25 PM

## 2022-08-13 NOTE — Progress Notes (Addendum)
Patient ID: Melissa Colon, female   DOB: [birth year], 62 y.o.   MRN: [id number]  SW informed pt brother, [name] of current bed availability and bed offer at [facility]. Sw will wait for FU.   2:09 PM: Patient and family in discussion of SNF VS.home.

## 2022-08-13 NOTE — Progress Notes (Signed)
Progress Note  Primary GI: Unassigned ( Dr. Leonides Schanz) DOA: 07/31/2022         Hospital Day: 14   Subjective  Chief Complaint: anemia, FOBT +  Patient with family at bedside, husband Leta Jungling and son alex. Provided some of the history.  She has never had colonoscopy. Had EGD with AVM, esophagitis, and gastropathy, s/p APC.  She had BM this AM, no overt GI bleeding.  She is eating lunch, no AB pain, nausea, vomiting   Objective   Vital signs in last 24 hours: Temp:  [96.8 F (36 C)-98 F (36.7 C)] 97.9 F (36.6 C) (07/29 0402) Pulse Rate:  [85-109] 102 (07/29 0402) Resp:  [13-23] 18 (07/29 0402) BP: (88-136)/(51-81) 114/62 (07/29 0402) SpO2:  [95 %-100 %] 95 % (07/29 0402) Weight:  [68 kg] 68 kg (07/28 1332) Last BM Date : 08/11/22 Last BM recorded by nurses in past 5 days Stool Type: Type 6 (Mushy consistency with ragged edges) (08/13/2022  6:44 AM)  General:   female in no acute distress  Heart:  Regular rate and rhythm; no murmurs Pulm: Clear anteriorly; no wheezing Abdomen:  Soft, Obese AB, Sluggish bowel sounds. No tenderness . Without guarding and Without rebound, No organomegaly appreciated. Extremities:  with  edema. Neurologic:  Alert and  oriented x4;  No focal deficits.  Psych:  Cooperative. Normal mood and affect.  Intake/Output from previous day: 07/28 0701 - 07/29 0700 In: 880 [P.O.:480; I.V.:400] Out: -  Intake/Output this shift: No intake/output data recorded.  Studies/Results: No results found.  Lab Results: Recent Labs    08/11/22 0555 08/12/22 0530 08/13/22 0741  WBC 8.2 8.9 9.8  HGB 9.7* 8.6* 8.6*  HCT 30.4* 27.7* 27.4*  PLT 356 359 383   BMET Recent Labs    08/12/22 0530 08/13/22 0741  NA 142 139  K 4.1 3.4*  CL 114* 110  CO2 23 20*  GLUCOSE 102* 130*  BUN 22* 16  CREATININE 0.63 0.58  CALCIUM 8.6* 8.5*   LFT No results for input(s): "PROT", "ALBUMIN", "AST", "ALT", "ALKPHOS", "BILITOT", "BILIDIR", "IBILI" in the last 72  hours. PT/INR No results for input(s): "LABPROT", "INR" in the last 72 hours.   Scheduled Meds:  acetaminophen  1,000 mg Oral TID   aspirin EC  81 mg Oral Daily   atorvastatin  80 mg Oral Daily   diclofenac Sodium  2 g Topical QID   docusate sodium  100 mg Oral Daily   enoxaparin (LOVENOX) injection  40 mg Subcutaneous Daily   folic acid  1 mg Oral Daily   multivitamin with minerals  1 tablet Oral Daily   nystatin   Topical BID   pantoprazole  40 mg Oral Daily   polyethylene glycol  17 g Oral Daily   senna-docusate  1 tablet Oral QHS   thiamine  100 mg Oral Daily   ticagrelor  90 mg Oral BID   Continuous Infusions:    Impression/Plan:   Anemia with FOBT + stools s/p CVA Decline in hemoglobin with Plavix and Lovenox without overt bleeding  7/28 EGD Dr. 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 Patient is never had colonoscopy 08/13/2022  HGB 8.6, baseline 12-14 to 9.7-hemoglobin has been stable.  No further overt GI bleeding -  Patient currently on Brilinta last received 7/29 -Hemoglobin has been stable at 8.6 without any blood transfusions, continue to monitor CBC, patient has overt GI bleeding or remains transfusion dependent anemia would suggest proceeding with colonoscopy however in setting of acute CVA on Brilinta with high stenosis patient high risk for further procedures.  Ophthalmic intracranial hemorrhage with right-sided weakness Admitted 07/27/2022 for CVA Severe stenosis PCA vasculature Initiated Plavix and Lovenox  History of alcohol use/non compliance  Principal Problem:   Acute ischemic right MCA stroke (HCC) Active Problems:   Anemia    LOS: 13 days   Doree Albee   08/13/2022, 12:37 PM

## 2022-08-13 NOTE — Discharge Summary (Signed)
Physician Discharge Summary  Patient ID: Chelle Pearl MRN: [id number] DOB/AGE: 60/19/1962 60 y.o.  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  Bruise.  No known injury. EXAM: RIGHT GREAT TOE COMPARISON:  None Available. FINDINGS: There is diffuse decreased bone mineralization. No acute fracture is seen. Mild hallux valgus. No significant joint space narrowing. No dislocation. IMPRESSION: 1. No acute fracture. 2. Mild hallux valgus. Electronically Signed   By: Neita Garnet M.D.   On: 08/15/2022 12:05   DG Abd 2 Views  Result Date: 08/09/2022 CLINICAL DATA:  Constipation EXAM: ABDOMEN - 2 VIEW COMPARISON:  Abdominal x-ray 06/16/2022 FINDINGS: The bowel gas pattern is normal. Stool burden is moderate. There is no evidence of free air. No radio-opaque calculi or other significant radiographic abnormality is seen. IMPRESSION: Negative. Electronically Signed   By: Darliss Cheney M.D.   On: 08/09/2022 20:53   DG Elbow 2 Views Right  Result Date: 08/08/2022 CLINICAL DATA:  Pain aggravated by exercise EXAM: RIGHT ELBOW - 2 VIEW COMPARISON:  None Available. FINDINGS: No evidence of acute fracture or dislocation. No elbow joint effusion. Demineralization. Mild degenerative changes about the elbow. IMPRESSION: No acute fracture or dislocation. Electronically Signed   By: Minerva Fester M.D.   On: 08/08/2022 18:51   MR BRAIN WO CONTRAST  Result Date: 08/07/2022 CLINICAL DATA:  Diplopia. EXAM: MRI HEAD WITHOUT CONTRAST  TECHNIQUE: Multiplanar, multiecho pulse sequences of the brain and surrounding structures were obtained without intravenous contrast. COMPARISON:  MRI brain 07/28/2022. FINDINGS: Brain: Mixed acute and subacute infarcts in the medial, inferior aspect of the right occipital lobe. Foci of petechial hemorrhage within the areas of subacute infarct. No significant mass effect. Encephalomalacia with surrounding hemosiderin deposition in the right cerebellar hemisphere is unchanged. Old hemorrhage in the left thalamus. Background of moderate chronic small-vessel disease. No acute hydrocephalus or extra-axial collection. Vascular: Normal flow voids. Skull and upper cervical spine: Normal marrow signal. Sinuses/Orbits: Negative. Other: None. IMPRESSION: Mixed acute and subacute infarcts in the medial, inferior aspect of the right occipital lobe. Foci of petechial hemorrhage within the areas of subacute infarct. No significant mass effect. Electronically Signed   By: Orvan Falconer M.D.   On: 08/07/2022 19:45   ECHOCARDIOGRAM COMPLETE BUBBLE STUDY  Result Date: 07/28/2022    ECHOCARDIOGRAM REPORT   Patient Name:   KETURA SALATA Date of Exam: 07/28/2022 Medical Rec #:  [id number]         Height:       62.0 in Accession #:    [id number]        Weight:       155.6 lb Date of Birth:  01-27-1960         BSA:          1.718 m Patient Age:    62 years          BP:           139/72 mmHg Patient Gender: F                 HR:  77 bpm. Exam Location:  Inpatient Procedure: 2D Echo, Color Doppler, Cardiac Doppler and Saline Contrast Bubble            Study Indications:    Stroke  History:        Patient has prior history of Echocardiogram examinations, most                 recent 12/11/2019. Risk Factors:Current Smoker, Hypertension and                 Alcohol abuse.  Sonographer:    Delcie Roch RDCS Referring Phys: [id number] CAROLE N HALL IMPRESSIONS  1. Left ventricular ejection fraction, by estimation, is 65 to 70%.  The left ventricle has normal function. The left ventricle has no regional wall motion abnormalities. Left ventricular diastolic parameters are consistent with Grade I diastolic dysfunction (impaired relaxation).  2. Right ventricular systolic function is normal. The right ventricular size is normal. There is normal pulmonary artery systolic pressure.  3. The mitral valve is normal in structure. No evidence of mitral valve regurgitation. No evidence of mitral stenosis.  4. The aortic valve is normal in structure. There is mild calcification of the aortic valve. Aortic valve regurgitation is not visualized. No aortic stenosis is present.  5. The inferior vena cava is normal in size with greater than 50% respiratory variability, suggesting right atrial pressure of 3 mmHg. FINDINGS  Left Ventricle: Left ventricular ejection fraction, by estimation, is 65 to 70%. The left ventricle has normal function. The left ventricle has no regional wall motion abnormalities. The left ventricular internal cavity size was normal in size. There is  no left ventricular hypertrophy. Left ventricular diastolic parameters are consistent with Grade I diastolic dysfunction (impaired relaxation). Right Ventricle: The right ventricular size is normal. No increase in right ventricular wall thickness. Right ventricular systolic function is normal. There is normal pulmonary artery systolic pressure. The tricuspid regurgitant velocity is 2.33 m/s, and  with an assumed right atrial pressure of 3 mmHg, the estimated right ventricular systolic pressure is 24.7 mmHg. Left Atrium: Left atrial size was normal in size. Right Atrium: Right atrial size was normal in size. Pericardium: There is no evidence of pericardial effusion. Mitral Valve: The mitral valve is normal in structure. No evidence of mitral valve regurgitation. No evidence of mitral valve stenosis. Tricuspid Valve: The tricuspid valve is normal in structure. Tricuspid valve regurgitation is  trivial. No evidence of tricuspid stenosis. Aortic Valve: The aortic valve is normal in structure. There is mild calcification of the aortic valve. Aortic valve regurgitation is not visualized. No aortic stenosis is present. Pulmonic Valve: The pulmonic valve was normal in structure. Pulmonic valve regurgitation is not visualized. No evidence of pulmonic stenosis. Aorta: The aortic root is normal in size and structure. Venous: The inferior vena cava is normal in size with greater than 50% respiratory variability, suggesting right atrial pressure of 3 mmHg. IAS/Shunts: No atrial level shunt detected by color flow Doppler. Agitated saline contrast was given intravenously to evaluate for intracardiac shunting.  LEFT VENTRICLE PLAX 2D LVIDd:         3.90 cm   Diastology LVIDs:         2.30 cm   LV e' medial:    6.64 cm/s LV PW:         0.80 cm   LV E/e' medial:  11.3 LV IVS:        0.80 cm   LV e' lateral:   8.49  cm/s LVOT diam:     1.80 cm   LV E/e' lateral: 8.8 LV SV:         54 LV SV Index:   32 LVOT Area:     2.54 cm  RIGHT VENTRICLE             IVC RV Basal diam:  2.00 cm     IVC diam: 1.00 cm RV S prime:     11.90 cm/s TAPSE (M-mode): 1.8 cm LEFT ATRIUM             Index        RIGHT ATRIUM          Index LA diam:        2.80 cm 1.63 cm/m   RA Area:     8.64 cm LA Vol (A2C):   17.2 ml 10.01 ml/m  RA Volume:   13.70 ml 7.97 ml/m LA Vol (A4C):   14.6 ml 8.50 ml/m LA Biplane Vol: 16.9 ml 9.83 ml/m  AORTIC VALVE LVOT Vmax:   107.00 cm/s LVOT Vmean:  72.400 cm/s LVOT VTI:    0.213 m  AORTA Ao Root diam: 3.10 cm Ao Asc diam:  3.10 cm MITRAL VALVE               TRICUSPID VALVE MV Area (PHT): 4.21 cm    TR Peak grad:   21.7 mmHg MV Decel Time: 180 msec    TR Vmax:        233.00 cm/s MV E velocity: 75.10 cm/s MV A velocity: 81.20 cm/s  SHUNTS MV E/A ratio:  0.92        Systemic VTI:  0.21 m                            Systemic Diam: 1.80 cm Arvilla Meres MD Electronically signed by Arvilla Meres MD Signature  Date/Time: 07/28/2022/10:35:33 AM    Final    MR BRAIN WO CONTRAST  Result Date: 07/28/2022 CLINICAL DATA:  Headache, stroke suspected EXAM: MRI HEAD WITHOUT CONTRAST TECHNIQUE: Multiplanar, multiecho pulse sequences of the brain and surrounding structures were obtained without intravenous contrast. COMPARISON:  12/10/2019 MRI head, correlation is also made with 07/27/2022 CT head FINDINGS: Brain: Restricted diffusion with ADC correlate in the medial, inferior right occipital lobe (series 5, images 68-72). These areas are associated with mildly increased T2 hyperintense signal. No acute hemorrhage, mass, mass effect, or midline shift. No hydrocephalus or extra-axial collection. Partial empty sella. Normal craniocervical junction. Redemonstrated large area of hemosiderin deposition in the left thalamus, extending into the left cerebral peduncle consistent with sequela of the acute hemorrhage noted in 2021. Hemosiderin deposition flow more remote right cerebellar hemorrhage, with additional foci in left cerebellum. Additional foci of hemosiderin deposition in the bilateral occipital, parietal, and temporal lobes, as well as the left basal ganglia and pons. Disproportionate cerebral volume loss in the bilateral parietal lobes, with redemonstrated sequela of prior left parietal infarct. Confluent T2 hyperintense signal in the periventricular white matter and pons, likely the sequela of moderate to severe chronic small vessel ischemic disease. Vascular: Partial loss of the left vertebral artery flow void, which is consistent with the stenosis seen on the same-day CTA. Otherwise patent proximal arterial flow voids. Skull and upper cervical spine: Normal marrow signal. Sinuses/Orbits: Clear paranasal sinuses. No acute finding in the orbits. Other: Trace fluid in the left mastoid air cells. IMPRESSION: 1. Acute infarct in the medial,  inferior right occipital lobe. No evidence of acute hemorrhage. 2. Hemosiderin  deposition and encephalomalacia in the left thalamus, consistent with sequela of the acute hemorrhage noted in 2021. These results were called by telephone at the time of interpretation on 07/28/2022 at 1:27 am to provider HALL, who verbally acknowledged these results. Electronically Signed   By: Wiliam Ke M.D.   On: 07/28/2022 01:39   CT ANGIO HEAD NECK W WO CM (CODE STROKE)  Result Date: 07/27/2022 CLINICAL DATA:  Neuro deficit, acute, stroke suspected EXAM: CT ANGIOGRAPHY HEAD AND NECK WITH AND WITHOUT CONTRAST TECHNIQUE: Multidetector CT imaging of the head and neck was performed using the standard protocol during bolus administration of intravenous contrast. Multiplanar CT image reconstructions and MIPs were obtained to evaluate the vascular anatomy. Carotid stenosis measurements (when applicable) are obtained utilizing NASCET criteria, using the distal internal carotid diameter as the denominator. RADIATION DOSE REDUCTION: This exam was performed according to the departmental dose-optimization program which includes automated exposure control, adjustment of the mA and/or kV according to patient size and/or use of iterative reconstruction technique. CONTRAST:  75mL OMNIPAQUE IOHEXOL 350 MG/ML SOLN COMPARISON:  CT head from today.  MRA Head December 10, 2019. FINDINGS: CTA NECK FINDINGS Aortic arch: Great vessel origins are patent. Right carotid system: Atherosclerosis at the carotid bifurcation without greater than 50% stenosis. Left carotid system: Patent.  No significant stenosis. Vertebral arteries: Right dominant. Left vertebral artery is patent without significant stenosis. Left vertebral artery is small throughout its course with suspected superimposed stenosis. Skeleton: No acute abnormality on limited assessment. Other neck: No acute abnormality on limited assessment. Upper chest: Visualized lung apices are clear. Review of the MIP images confirms the above findings CTA HEAD FINDINGS Anterior  circulation: Bilateral intracranial ICAs are patent with moderate paraclinoid ICA stenosis bilaterally. Bilateral MCAs and bilateral ACAs are patent proximally. Severe right M2 MCA branch stenosis. Critical stenosis versus occlusion of the distal right ACA (A4 segment). Posterior circulation: Small/non dominant left intradural vertebral artery with severe stenosis and distal occlusion. Right intradural vertebral artery is patent. Basilar artery is patent with moderate distal stenosis. Multifocal severe proximal P2 PCA stenosis with possible more distal left P2 PCA occlusion versus critical stenosis. Similar findings were present on prior MRA. Severe right P2 PCA, new since 2021. Venous sinuses: As permitted by contrast timing, patent. Review of the MIP images confirms the above findings IMPRESSION: 1. Multifocal severe proximal P2 PCA stenosis with possible more distal left P2 PCA occlusion versus critical stenosis. Similar findings were present on prior MRA. 2. Critical stenosis versus occlusion of the distal right ACA (A4 segment). 3. Severe right P2 PCA, new since 2021. 4. Severe right M2 MCA branch stenosis. 5. Small/non dominant left intradural vertebral artery with severe stenosis and distal occlusion. 6. Moderate distal basilar artery stenosis. 7. Moderate paraclinoid ICA stenosis bilaterally. 8. Left vertebral artery is patent without significant stenosis. 9. Left vertebral artery is small throughout its course in the neck with suspected superimposed stenosis. Preliminary imaging results were communicated on 07/27/2022 at 4:49 pm to provider Dr. Otelia Limes via telephone, who verbally acknowledged these results. Electronically Signed   By: Feliberto Harts M.D.   On: 07/27/2022 17:00   CT HEAD CODE STROKE WO CONTRAST  Result Date: 07/27/2022 CLINICAL DATA:  Code stroke.  Neuro deficit, acute, stroke suspected EXAM: CT HEAD WITHOUT CONTRAST TECHNIQUE: Contiguous axial images were obtained from the base of the  skull through the vertex without intravenous contrast. RADIATION DOSE REDUCTION:  This exam was performed according to the departmental dose-optimization program which includes automated exposure control, adjustment of the mA and/or kV according to patient size and/or use of iterative reconstruction technique. COMPARISON:  CT head 09/13/2021. FINDINGS: Brain: No evidence of acute infarction, hemorrhage, hydrocephalus, extra-axial collection or mass lesion/mass effect. Similar areas of dystrophic calcification in the cerebellum and occipital lobes. Similar remote left parietal and right cerebellar infarct. Vascular: No hyperdense vessel vessel. Skull: No acute fracture.  Prior right occipital cranioplasty. Sinuses/Orbits: No acute abnormality. Other: No mastoid effusions. ASPECTS [facility] Stroke Program Early CT Score) total score (0-10 with 10 being normal): 10. IMPRESSION: 1. No evidence of acute intracranial abnormality. ASPECTS is 10. 2. Chronic infarcts, similar. Code stroke imaging results were communicated on 07/27/2022 at 4:33 pm to provider Dr. Otelia Limes via secure text paging. Electronically Signed   By: Feliberto Harts M.D.   On: 07/27/2022 16:33    Labs:  Basic Metabolic Panel: Recent Labs  Lab 08/10/22 0532 08/12/22 0530 08/13/22 0741  NA 140 142 139  K 4.2 4.1 3.4*  CL 110 114* 110  CO2 20* 23 20*  GLUCOSE 103* 102* 130*  BUN 18 22* 16  CREATININE 0.59 0.63 0.58  CALCIUM 8.6* 8.6* 8.5*    CBC: Recent Labs  Lab 08/11/22 0555 08/12/22 0530 08/13/22 0741  WBC 8.2 8.9 9.8  HGB 9.7* 8.6* 8.6*  HCT 30.4* 27.7* 27.4*  MCV 86.4 87.7 88.1  PLT 356 359 383    CBG: Recent Labs  Lab 08/14/22 2111 08/15/22 0616  GLUCAP 94 95    Family history.  Mother with diabetes.  Denies any colon cancer esophageal cancer or rectal cancer  Brief HPI:   Shraddha Horry is a 62 y.o. right-handed female with history significant for posterior fossa brain tumor with resection age 28 with  radiation therapy, left thalamic intracranial hemorrhage residual right-sided weakness receiving CIR 12/14/2019 - 01/05/2020, hypertension, tobacco and alcohol use as well as medical noncompliance was not taking any of her home medications.  Per chart review lives with spouse and children.  Ambulatory within the home with assistance provided by son/husband.  She does have a sedentary lifestyle.  Presented 07/27/2022 with acute occipital headache as well as right facial droop and worsening right-sided weakness with slurred speech and altered mental status.  Noted blood pressure 201/117.  Cranial CT scan showed no evidence of acute intracranial abnormality.  MRI identified acute infarction in the medial, inferior right occipital lobe.  No evidence of acute hemorrhage.  Patient did not receive TNK.  CT angiogram head and neck multifocal severe proximal P2 PCA stenosis with possible more distal left P2 PCA occlusion.  Critical stenosis versus occlusion of the distal right ACA.  Severe right P2 PCA new since 2021.  Admission chemistries unremarkable alcohol urine drug screen negative.  Echocardiogram with ejection fraction of 65 to 70% grade 1 diastolic dysfunction no regional wall motion abnormalities.  Neurology follow-up maintained on low-dose aspirin as well as Plavix x 90 days then aspirin alone.  Lovenox for DVT prophylaxis.  Tolerating a regular diet.  Therapy evaluations completed due to patient decreased functional mobility right side weakness was admitted for a comprehensive rehab program.   Hospital Course: Jannat Blood was admitted to rehab 07/31/2022 for inpatient therapies to consist of PT, ST and OT at least three hours five days a week. Past admission physiatrist, therapy team and rehab RN have worked together to provide customized collaborative inpatient rehab.  Pertaining to patient's right occipital  lobe infarction likely due to intracranial stenosis as well as history of posterior fossa brain  tumor age 69 with resection and left thalamic intracranial hemorrhage residual right-sided weakness receiving CIR 11/21.    A follow-up MRI 08/07/2022 due to some diplopia showed mixed acute and subacute infarcts in the medial, inferior aspect of the right occipital lobe.  Foci of petechial hemorrhage within the areas of subacute infarct.  No significant mass effect.  Patient's Plavix was changed to Brilinta 90 mg twice daily and would also continue low-dose aspirin.  Hospital course complicated by anemia hemoglobin dipping to 11.9-8.6 with Hemoccult stool positive.  Gastroenterology services consulted undergoing upper GI endoscopy 08/12/2022 per Dr. Elnoria Howard showing mucosal changes in the esophagus consistent with esophagitis.  Erosive gastropathy with stigmata of recent bleeding.  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  Placed on PPI follow-up hemoglobin hematocrit.  Plan follow-up outpatient consider plan for possible need for colonoscopy.  Patient continued on Lovenox monitoring for any further bleeding episodes.  Permissive hypertension and monitored closely per neurology services.  Noted history of tobacco alcohol abuse receiving counseling are cessation of these products.  Medical noncompliance again receiving counseling in regards to maintaining medical regimen.   Blood pressures were monitored on TID basis and permissive hypertension and monitored.  Patient had not been taking prescribed medications prior to admission      Rehab course: During patient's stay in rehab weekly team conferences were held to monitor patient's progress, set goals and discuss barriers to discharge. At admission, patient required max assist step pivot transfers minimal guard sit to supine  Physical exam.  Blood pressure 128/75 pulse 65 temperature 97.6 respirations 20 oxygen saturation is 100% Constitutional.  No acute distress HEENT Head.  Normocephalic and atraumatic Eyes.  Left gaze preference.  Pupils  round and reactive to light Neck.  Supple nontender no JVD without thyromegaly Cardiac regular rate and rhythm without any extra sounds or murmur heard Abdomen.  Soft nontender positive bowel sounds without rebound Respiratory effort normal no respiratory distress without wheeze Musculoskeletal Comments.  Right upper extremity 2/5 except right tricep 2 -/5 and FA 1/5 Right lower extremity hip flexors 3 -/5 Neurologic.  Speech dysarthric but intelligible.  Fair insight and awareness  He/She  has had improvement in activity tolerance, balance, postural control as well as ability to compensate for deficits. He/She has had improvement in functional use RUE/LUE  and RLE/LLE as well as improvement in awareness.  Block practice squat pivot wheelchair edge of bed x 2 using left upper extremity support on bed rail requiring cueing to scoot hips forward prior to initiating transfers.  Moderate assist to propel wheelchair.  Completes supine to sitting edge of bed using hospital bed features with supervision.  Transfer training with son who reports had been helping with transfers at home.  Completes x 2 squat pivot stand transfers using hospital bed rail completed left the right with moderate assist.  Patient completes squat pivot transfer to the right onto bedside commode placed over toilet using grab bars with heavy minimal assist.  Needs assist for lower body ADLs.  Full family teaching completed plan discharge to home       Disposition: Discharge to home    Diet: Regular  Special Instructions: No driving smoking or alcohol  Medications at discharge 1.  Aspirin 81 mg p.o. daily 2.  Lipitor 80 mg p.o. daily 3.  Voltaren gel 2 g 4 times daily to affected area 4.  Colace 100 mg  p.o. daily 5.  Folic acid 1 mg p.o. daily 6.  Antivert 25 mg p.o. 3 times daily as needed dizziness 7.  Robaxin 750 mg every 8 hours as needed muscle spasms 8.  Multivitamin daily 9.  Protonix 40 mg p.o. daily 10.   Brilinta 90 mg p.o. twice daily 11.  MiraLAX daily hold for loose stools  30-35 minutes were spent completing discharge summary and discharge planning  Discharge Instructions     Ambulatory referral to Neurology   Complete by: As directed    An appointment is requested in approximately: 4 weeks ischemic CVA   Ambulatory referral to Physical Medicine Rehab   Complete by: As directed    Moderate complexity follow-up 1 to 2 weeks right occlusive ischemic infarction secondary to intracranial stenosis        Follow-up Information     Kirsteins, Victorino Sparrow, MD Follow up.   Specialty: Physical Medicine and Rehabilitation Why: Office to call for appointment Contact information: [address] [place] [address] [phone]         Jeani Hawking, MD Follow up.   Specialty: Gastroenterology Why: Call for appointment Contact information: [address] [place] [address] [phone]                 Signed: Mcarthur Rossetti  08/17/2022, 5:09 AM

## 2022-08-13 NOTE — Progress Notes (Signed)
PROGRESS NOTE   Subjective/Complaints: EGD- evidence of gastritis , 2 small duodenal angiodysplastic lesions, evidence of recent but not current bleed  Pt's birthday today.  She is aware       ROS- neg for fevers, abdominal pain CP, SOB, N/V/D, new motor or sensory changes, Headache +constipation-improved , double vision -unchanged  Objective:   No results found. Recent Labs    08/11/22 0555 08/12/22 0530  WBC 8.2 8.9  HGB 9.7* 8.6*  HCT 30.4* 27.7*  PLT 356 359    Recent Labs    08/12/22 0530  NA 142  K 4.1  CL 114*  CO2 23  GLUCOSE 102*  BUN 22*  CREATININE 0.63  CALCIUM 8.6*     Intake/Output Summary (Last 24 hours) at 08/13/2022 0755 Last data filed at 08/13/2022 0700 Gross per 24 hour  Intake 880 ml  Output --  Net 880 ml        Physical Exam: Vital Signs Blood pressure 114/62, pulse (!) 102, temperature 97.9 F (36.6 C), temperature source Oral, resp. rate 18, height 5\' 2"  (1.575 m), weight 68 kg, SpO2 95%.   General: No acute distress.  She is a little anxious about endoscopy results Heart: Regular rate and rhythm no rubs murmurs or extra sounds Lungs: Clear to auscultation, breathing unlabored, no rales or wheezes Abdomen: Positive bowel sounds, soft nontender to palpation, nondistended Extremities: No clubbing, cyanosis, or edema Skin: warm and dry Neurologic: Alert and awake, cranial nerves II through XII grossly intact, delayed responses-improved 3-/5 RIght delt bi tri grip HF 4- KE 3- ADF Disconjugate gaze , tape on glasses on L Continues to have L visual field deficits Musculoskeletal: Tenderness right elbow with range of motion and palpation appears to be improved today   Assessment/Plan: 1. Functional deficits which require 3+ hours per day of interdisciplinary therapy in a comprehensive inpatient rehab setting. Physiatrist is providing close team supervision and 24 hour  management of active medical problems listed below. Physiatrist and rehab team continue to assess barriers to discharge/monitor patient progress toward functional and medical goals  Care Tool:  Bathing    Body parts bathed by patient: Chest, Abdomen, Front perineal area, Right upper leg, Left upper leg, Face, Right arm   Body parts bathed by helper: Buttocks, Left arm, Left lower leg, Right lower leg     Bathing assist Assist Level: Moderate Assistance - Patient 50 - 74%     Upper Body Dressing/Undressing Upper body dressing   What is the patient wearing?: Pull over shirt    Upper body assist Assist Level: Minimal Assistance - Patient > 75%    Lower Body Dressing/Undressing Lower body dressing      What is the patient wearing?: Pants, Underwear/pull up     Lower body assist Assist for lower body dressing: Moderate Assistance - Patient 50 - 74%     Toileting Toileting Toileting Activity did not occur Press photographer and hygiene only): Refused  Toileting assist Assist for toileting: Maximal Assistance - Patient 25 - 49%     Transfers Chair/bed transfer  Transfers assist  Chair/bed transfer activity did not occur: Safety/medical concerns  Chair/bed transfer assist level: Moderate  Assistance - Patient 50 - 74% Chair/bed transfer assistive device: Armrests, Bedrails   Locomotion Ambulation   Ambulation assist   Ambulation activity did not occur: Safety/medical concerns          Walk 10 feet activity   Assist  Walk 10 feet activity did not occur: Safety/medical concerns        Walk 50 feet activity   Assist Walk 50 feet with 2 turns activity did not occur: Safety/medical concerns         Walk 150 feet activity   Assist Walk 150 feet activity did not occur: Safety/medical concerns         Walk 10 feet on uneven surface  activity   Assist Walk 10 feet on uneven surfaces activity did not occur: Safety/medical concerns          Wheelchair     Assist Is the patient using a wheelchair?: Yes Type of Wheelchair: Manual    Wheelchair assist level: Dependent - Patient 0%      Wheelchair 50 feet with 2 turns activity    Assist        Assist Level: Dependent - Patient 0%   Wheelchair 150 feet activity     Assist      Assist Level: Dependent - Patient 0%   Blood pressure 114/62, pulse (!) 102, temperature 97.9 F (36.6 C), temperature source Oral, resp. rate 18, height 5\' 2"  (1.575 m), weight 68 kg, SpO2 95%.  Medical Problem List and Plan: 1. 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  Antithrombotics: -DVT/anticoagulation:  Pharmaceutical: Lovenox -Discussed Lovenox for DVT prevention             -antiplatelet therapy: Aspirin 81 mg daily and Plavix 75 mg daily x 90 days then aspirin alone 3. Pain Management: Tylenol as needed   - 7/20: schedule tylenol 1000 mg TID. Has PRN robaxin.    - 7/21-8: no c/o pain; monitor  4. Mood/Behavior/Sleep: Provide emotional support             -antipsychotic agents: N/A 5. Neuropsych/cognition: This patient is capable of making decisions on her own behalf. 6. Skin/Wound Care: Routine skin checks 7. Fluids/Electrolytes/Nutrition: Routine in and outs with follow-up chemistries, asking to have IV removed , had IV for EGD but not receiving meds or fluids at this time via IV  8.  Permissive  hypertension.  Monitor with increased mobility    08/13/2022    4:02 AM 08/12/2022    7:34 PM 08/12/2022    3:02 PM  Vitals with BMI  Systolic 114 115 811  Diastolic 62 81 73  Pulse 102 109 85    7/28 BP well-controlled, heart rate elevated at times with mild tachy, monitor for orthostatic changes   9.  Hyperlipidemia.  Lipitor Lipid Panel     Component Value Date/Time   CHOL 184 07/28/2022 0523  TRIG 130 07/28/2022 0523   HDL 35 (L) 07/28/2022 0523   CHOLHDL 5.3 07/28/2022 0523   VLDL 26 07/28/2022 0523   LDLCALC 123 (H) 07/28/2022 0523    10.  History of alcohol tobacco use.  Counseling, nicoderm patch  11.  Overweight  BMI 28.47.  Dietary follow-up 12.  Medical noncompliance.  Counseling- doesn't like to take meds, esp HTN meds.   13.  Straining to have BM, will add colace    - LBM 7/23, pt reports still feeling constipated, will add senokot  -7/25 patient had BM yesterday however still feels constipated, will check abdominal x-ray `-7/26, last BM this morning.  X-ray yesterday with moderate stool burden  7/27 last BM this morning, continue to monitor  14.  Hypokalemia s/p supplementation recheck K + in am  - 3.2 7/19   - Add kdur 20 mg daily for 2 days, recheck Monday  -7/23 K+ stable at 4.2  -7/28 potassium stable at 4.1 15. Anemia  -    Latest Ref Rng & Units 08/12/2022    5:30 AM 08/11/2022    5:55 AM 08/10/2022    5:32 AM  CBC  WBC 4.0 - 10.5 K/uL 8.9  8.2  7.2   Hemoglobin 12.0 - 15.0 g/dL 8.6  9.7  16.1   Hematocrit 36.0 - 46.0 % 27.7  30.4  31.8   Platelets 150 - 400 K/uL 359  356  346     7/28 Hemoccult positive, patient had endoscopy today, small erosions with stigmata of recent bleeding were found.  GI recommends resume regular diet continue PPI, follow hemoglobin and transfuse if necessary.  If anemia persists colonoscopy will be needed.  Appreciate assistance   16. Dizziness/double vision intermittent. Pt reports this has been going on since  CVA  RIght occipital infarcts noted on MRI 7/23- which may give visual disturbance but not diplopia  17.  Renal azotemia  -BUN up to 22 creatinine stable at 0.63.  Encourage fluid intake  LOS: 13 days A FACE TO FACE EVALUATION WAS PERFORMED  Erick Colace 08/13/2022, 7:55 AM

## 2022-08-13 NOTE — Progress Notes (Signed)
Occupational Therapy Session Note  Patient Details  Name: Careli Hohenstein MRN: [id number] Date of Birth: 11-03-1960  Today's Date: 08/13/2022 OT Individual Time: 4098-1191 OT Individual Time Calculation (min): 41 min    Short Term Goals: Week 2:  OT Short Term Goal 1 (Week 2): Pt will utilize compensatory strategies for diplopia with min verbal cues OT Short Term Goal 2 (Week 2): Pt will complete toileting mod A OT Short Term Goal 3 (Week 2): Pt will attend to R visual field/ R hemi-body with min verbal cues consistently  Skilled Therapeutic Interventions/Progress Updates:    Pt received sitting up with no c/o pain, agreeable to OT session. She donned B shoes with R aircast with max A. She stood with the RW with max A, poor weightshift over the R. She required max facilitation at the R ankle for stability. Max cueing for hand placement during transfer. Pt was taken via w/c to the therapy gym for time management. She completed 4x sit <> stands from the w/c at the high-low table with max A and pt reporting pain in her RLE/RUE, described as tingling, requiring her to return to sitting. She then completed blocked practice squat pivot transfers, requiring heavy mod A toward the L and surprisingly only min A to the R. She returned to her room and was left sitting up with all needs met.    Therapy Documentation Precautions:  Precautions Precautions: Fall Precaution Comments: right UE and LE hemiparesis. Restrictions Weight Bearing Restrictions: No  Therapy/Group: Individual Therapy  Crissie Reese 08/13/2022, 6:34 AM

## 2022-08-13 NOTE — Plan of Care (Signed)
  Problem: Consults Goal: RH STROKE PATIENT EDUCATION Description: See Patient Education module for education specifics  Outcome: Progressing   Problem: RH SAFETY Goal: RH STG ADHERE TO SAFETY PRECAUTIONS W/ASSISTANCE/DEVICE Description: STG Adhere to Safety Precautions With cues Assistance/Device. Outcome: Progressing   Problem: RH KNOWLEDGE DEFICIT Goal: RH STG INCREASE KNOWLEDGE OF HYPERTENSION Description: Patient and family will be able to manage HTN with medications and dietary modifications using educational resources independently Outcome: Progressing Goal: RH STG INCREASE KNOWLEGDE OF HYPERLIPIDEMIA Description: Patient and family will be able to manage HLD with medications and dietary modifications using educational resources independently Outcome: Progressing Goal: RH STG INCREASE KNOWLEDGE OF STROKE PROPHYLAXIS Description: Patient and family will be able to manage secondary risks with medications and dietary modifications using educational resources independently Outcome: Progressing   Problem: RH Vision Goal: RH LTG Vision (Specify) Outcome: Progressing   Problem: Education: Goal: Knowledge of General Education information will improve Description: Including pain rating scale, medication(s)/side effects and non-pharmacologic comfort measures Outcome: Progressing   Problem: Health Behavior/Discharge Planning: Goal: Ability to manage health-related needs will improve Outcome: Progressing   Problem: Clinical Measurements: Goal: Ability to maintain clinical measurements within normal limits will improve Outcome: Progressing Goal: Will remain free from infection Outcome: Progressing Goal: Diagnostic test results will improve Outcome: Progressing Goal: Respiratory complications will improve Outcome: Progressing Goal: Cardiovascular complication will be avoided Outcome: Progressing   Problem: Activity: Goal: Risk for activity intolerance will decrease Outcome:  Progressing   Problem: Nutrition: Goal: Adequate nutrition will be maintained Outcome: Progressing   Problem: Coping: Goal: Level of anxiety will decrease Outcome: Progressing   Problem: Elimination: Goal: Will not experience complications related to bowel motility Outcome: Progressing Goal: Will not experience complications related to urinary retention Outcome: Progressing   Problem: Pain Managment: Goal: General experience of comfort will improve Outcome: Progressing   Problem: Safety: Goal: Ability to remain free from injury will improve Outcome: Progressing   Problem: Skin Integrity: Goal: Risk for impaired skin integrity will decrease Outcome: Progressing

## 2022-08-14 LAB — GLUCOSE, CAPILLARY: Glucose-Capillary: 94 mg/dL (ref 70–99)

## 2022-08-14 NOTE — Plan of Care (Signed)
  Problem: RH Car Transfers Goal: LTG Patient will perform car transfers with assist (PT) Description: LTG: Patient will perform car transfers with assistance (PT). Outcome: Not Applicable Flowsheets (Taken 08/14/2022 1919) LTG: Pt will perform car transfers with assist:: (Patient required ambulance transport at [facility] due to inaccessible home set-up) -- Note: Patient required ambulance transport at [facility] due to inaccessible home set-up    Problem: RH Ambulation Goal: LTG Patient will ambulate in controlled environment (PT) Description: LTG: Patient will ambulate in a controlled environment, # of feet with assistance (PT). Outcome: Not Applicable Flowsheets (Taken 08/14/2022 1919) LTG: Pt will ambulate in controlled environ  assist needed:: (Goal D/C'd because patient was not a functional ambulator at baseline and does not currently have the potential to become a functional ambulator) -- Note: patient was not a functional ambulator at baseline  Goal: LTG Patient will ambulate in home environment (PT) Description: LTG: Patient will ambulate in home environment, # of feet with assistance (PT). Outcome: Not Applicable Flowsheets (Taken 08/14/2022 1919) LTG: Pt will ambulate in home environ  assist needed:: (Goal D/C'd because patient was not a functional ambulator at baseline and does not currently have the potential to become a functional ambulator) --   Problem: RH Stairs Goal: LTG Patient will ambulate up and down stairs w/assist (PT) Description: LTG: Patient will ambulate up and down # of stairs with assistance (PT) Outcome: Not Applicable Flowsheets (Taken 08/14/2022 1919) LTG: Pt will ambulate up/down stairs assist needed:: (Goal D/C'd as pt did not perform stair mobility at baseline and is not safe to attempt) --   Problem: RH Wheelchair Mobility Goal: LTG Patient will propel w/c in controlled environment (PT) Description: LTG: Patient will propel wheelchair in controlled  environment, # of feet with assist (PT) Flowsheets (Taken 08/14/2022 1922) LTG: Pt will propel w/c in controlled environ  assist needed:: Supervision/Verbal cueing LTG: Propel w/c distance in controlled environment: 58ft Goal: LTG Patient will propel w/c in home environment (PT) Description: LTG: Patient will propel wheelchair in home environment, # of feet with assistance (PT). Flowsheets (Taken 08/14/2022 1922) LTG: Pt will propel w/c in home environ  assist needed:: Contact Guard/Touching assist LTG: Propel w/c distance in home environment: 44ft

## 2022-08-14 NOTE — Progress Notes (Signed)
PROGRESS NOTE   Subjective/Complaints:  Pt knows it is day after birthday but cannot tell me day or date   Feels like vision is worse "I see 4" No new swallowing issues, no new facial or limb  weakness, no new numbness, reviewed last MRI as well as CT angio of head /neck       ROS- neg for fevers, abdominal pain CP, SOB, N/V/D, new motor or sensory changes, Headache +constipation-improved , double vision -unchanged  Objective:   No results found. Recent Labs    08/12/22 0530 08/13/22 0741  WBC 8.9 9.8  HGB 8.6* 8.6*  HCT 27.7* 27.4*  PLT 359 383    Recent Labs    08/12/22 0530 08/13/22 0741  NA 142 139  K 4.1 3.4*  CL 114* 110  CO2 23 20*  GLUCOSE 102* 130*  BUN 22* 16  CREATININE 0.63 0.58  CALCIUM 8.6* 8.5*     Intake/Output Summary (Last 24 hours) at 08/14/2022 0816 Last data filed at 08/13/2022 1730 Gross per 24 hour  Intake --  Output 250 ml  Net -250 ml        Physical Exam: Vital Signs Blood pressure 130/74, pulse 97, temperature 98 F (36.7 C), temperature source Oral, resp. rate 18, height 5\' 2"  (1.575 m), weight 68 kg, SpO2 99%.   General: No acute distress.  She is a little anxious about endoscopy results Heart: Regular rate and rhythm no rubs murmurs or extra sounds Lungs: Clear to auscultation, breathing unlabored, no rales or wheezes Abdomen: Positive bowel sounds, soft nontender to palpation, nondistended Extremities: No clubbing, cyanosis, or edema Skin: warm and dry Neurologic: Alert and awake, cranial nerves II through XII grossly intact, delayed responses-improved 3-/5 RIght delt bi tri grip HF 4- KE 3- ADF- unchanged  EOMI has double vision RIght lateral gaze which corrects with left lateral gaze, no nystagmus or vertical diplopia  Neuro:  Eyes without evidence of nystagmus  Tone is normal without evidence of spasticity   Cranial nerves II- Visual fields are intact to  confrontation testing, no blurring of vision III- no evidence of ptosis, upward, downward and medial gaze intact IV- no vertical diplopia or head tilt V- no facial numbness or masseter weakness VI- no pupil abduction weakness VII- no facial droop, good lid closure VII- normal auditory acuity IX- no pharygeal weakness,  X- no pharyngeal weakness, no hoarseness XI- no trap or SCM weakness XII- no glossal weakness      Assessment/Plan: 1. Functional deficits which require 3+ hours per day of interdisciplinary therapy in a comprehensive inpatient rehab setting. Physiatrist is providing close team supervision and 24 hour management of active medical problems listed below. Physiatrist and rehab team continue to assess barriers to discharge/monitor patient progress toward functional and medical goals  Care Tool:  Bathing    Body parts bathed by patient: Chest, Abdomen, Front perineal area, Right upper leg, Left upper leg, Face, Right arm   Body parts bathed by helper: Buttocks, Left arm, Left lower leg, Right lower leg     Bathing assist Assist Level: Moderate Assistance - Patient 50 - 74%     Upper Body Dressing/Undressing Upper body  dressing   What is the patient wearing?: Pull over shirt    Upper body assist Assist Level: Minimal Assistance - Patient > 75%    Lower Body Dressing/Undressing Lower body dressing      What is the patient wearing?: Pants, Underwear/pull up     Lower body assist Assist for lower body dressing: Moderate Assistance - Patient 50 - 74%     Toileting Toileting Toileting Activity did not occur Press photographer and hygiene only): Refused  Toileting assist Assist for toileting: Maximal Assistance - Patient 25 - 49%     Transfers Chair/bed transfer  Transfers assist  Chair/bed transfer activity did not occur: Safety/medical concerns  Chair/bed transfer assist level: Moderate Assistance - Patient 50 - 74% Chair/bed transfer assistive  device: Armrests, Bedrails   Locomotion Ambulation   Ambulation assist   Ambulation activity did not occur: Safety/medical concerns          Walk 10 feet activity   Assist  Walk 10 feet activity did not occur: Safety/medical concerns        Walk 50 feet activity   Assist Walk 50 feet with 2 turns activity did not occur: Safety/medical concerns         Walk 150 feet activity   Assist Walk 150 feet activity did not occur: Safety/medical concerns         Walk 10 feet on uneven surface  activity   Assist Walk 10 feet on uneven surfaces activity did not occur: Safety/medical concerns         Wheelchair     Assist Is the patient using a wheelchair?: Yes Type of Wheelchair: Manual    Wheelchair assist level: Dependent - Patient 0%      Wheelchair 50 feet with 2 turns activity    Assist        Assist Level: Dependent - Patient 0%   Wheelchair 150 feet activity     Assist      Assist Level: Dependent - Patient 0%   Blood pressure 130/74, pulse 97, temperature 98 F (36.7 C), temperature source Oral, resp. rate 18, height 5\' 2"  (1.575 m), weight 68 kg, SpO2 99%.  Medical Problem List and Plan: 1. 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 as well as history of posterior fossa brain tumor age 71 with resection and left thalamic intracranial hemorrhage residual right-sided weakness receiving CIR 11/21, bilateral PCA stenosis             -patient may  shower             -ELOS/Goals: 14-18 days- min A to supervision Cont CIR PT, OT, SLP  -estimated discharge 8/2- SNF pending   -MRI 7/23 with mixed acute and subacute infarcts, neurology following and checking antiplatelet resistance labs, permissive hypertension, appreciate assistance CT angiogram head and neck multifocal severe proximal P2 PCA stenosis with possible more distal left P2 PCA occlusion. Critical stenosis  versus occlusion of the distal right ACA. Severe right P2 PCA new since 2021 -7/30 exam unchanged, has cognitive deficits and does not recall her deficits from day to day   2.  Antithrombotics: -DVT/anticoagulation:  Pharmaceutical: Lovenox -Discussed Lovenox for DVT prevention             -antiplatelet therapy: Aspirin 81 mg daily and Plavix 75 mg daily , clopidigrel switched to ticagrelor based on antiplatelet resistance  3. Pain Management: Tylenol as needed   - 7/20: schedule tylenol 1000 mg  TID. Has PRN robaxin.    - 7/21-8: no c/o pain; monitor  4. Mood/Behavior/Sleep: Provide emotional support             -antipsychotic agents: N/A 5. Neuropsych/cognition: This patient is capable of making decisions on her own behalf. 6. Skin/Wound Care: Routine skin checks 7. Fluids/Electrolytes/Nutrition: Routine in and outs with follow-up chemistries, asking to have IV removed , had IV for EGD but not receiving meds or fluids at this time via IV  8.  Permissive hypertension.  Monitor with increased mobility    08/14/2022    4:17 AM 08/13/2022    7:59 PM 08/13/2022    1:32 PM  Vitals with BMI  Systolic 130 127 295  Diastolic 74 72 68  Pulse 97 97 105    7/28 BP well-controlled, heart rate elevated at times with mild tachy, monitor for orthostatic changes   9.  Hyperlipidemia.  Lipitor Lipid Panel     Component Value Date/Time   CHOL 184 07/28/2022 0523   TRIG 130 07/28/2022 0523   HDL 35 (L) 07/28/2022 0523   CHOLHDL 5.3 07/28/2022 0523   VLDL 26 07/28/2022 0523   LDLCALC 123 (H) 07/28/2022 0523    10.  History of alcohol tobacco use.  Counseling, nicoderm patch  11.  Overweight  BMI 28.47.  Dietary follow-up 12.  Medical noncompliance.  Counseling- doesn't like to take meds, esp HTN meds.   13.  Straining to have BM, will add colace    - LBM 7/23, pt reports still feeling constipated, will add senokot  -7/25 patient had BM yesterday however still feels constipated, will check  abdominal x-ray `-7/26, last BM this morning.  X-ray yesterday with moderate stool burden  7/27 last BM this morning, continue to monitor  14.  Hypokalemia s/p supplementation recheck K + in am  - 3.2 7/19   - Add kdur 20 mg daily for 2 days, recheck Monday  -7/23 K+ stable at 4.2  -7/28 potassium stable at 4.1 15. Anemia  -    Latest Ref Rng & Units 08/13/2022    7:41 AM 08/12/2022    5:30 AM 08/11/2022    5:55 AM  CBC  WBC 4.0 - 10.5 K/uL 9.8  8.9  8.2   Hemoglobin 12.0 - 15.0 g/dL 8.6  8.6  9.7   Hematocrit 36.0 - 46.0 % 27.4  27.7  30.4   Platelets 150 - 400 K/uL 383  359  356     7/28 Hemoccult positive, patient had endoscopy today, small erosions with stigmata of recent bleeding were found.  GI recommends resume regular diet continue PPI, follow hemoglobin and transfuse if necessary.  If anemia persists colonoscopy will be needed.  Appreciate assistance   16. Dizziness/double vision intermittent. Pt reports this has been going on since CVA  RIght occipital infarcts noted on MRI 7/23- which may give visual disturbance but not diplopia  17.  Renal azotemia  -BUN up to 22 creatinine stable at 0.63.  Encourage fluid intake  LOS: 14 days A FACE TO FACE EVALUATION WAS PERFORMED  Erick Colace 08/14/2022, 8:16 AM

## 2022-08-14 NOTE — Progress Notes (Signed)
Patient states she is scared. She woke up this morning and sees x 4. No changes from physical assessments.  Lungs cta lobes at this time. Dr. Wynn Banker made face to face visit. POC in progress.   Tilden Dome, LPN

## 2022-08-14 NOTE — Progress Notes (Signed)
Occupational Therapy Session Note  Patient Details  Name: Melissa Colon MRN: [id number] Date of Birth: 05-Aug-1960  Today's Date: 08/14/2022 OT Individual Time: 1005-1100 OT Individual Time Calculation (min): 55 min    Short Term Goals: Week 2:  OT Short Term Goal 1 (Week 2): Pt will utilize compensatory strategies for diplopia with min verbal cues OT Short Term Goal 2 (Week 2): Pt will complete toileting mod A OT Short Term Goal 3 (Week 2): Pt will attend to R visual field/ R hemi-body with min verbal cues consistently  Skilled Therapeutic Interventions/Progress Updates:  Skilled OT intervention completed with focus on emotional support, toilet transfers. Pt received seated in w/c. Unrated pain reported in RUE intermittently; pre-medicated. OT offered rest breaks and repositioning throughout for pain reduction.  Pt was verbose and tangential about "not being able to see" however after further inspection, pt verbalized being able to see "outlines, colors and some features" but seeing multiples of things some 3 or 4 vs just double vision. Checked in with nursing and MD already aware with no concerns verbalized at this time. 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 Redirection needed for therapy focus.  K-tape noted to still be on RUE that this OT previously applied > 3 days ago therefore pt agreeable to doff to allow skin to breathe and inspect for irritation. Pt impulsively started pulling the tape off aggressively, upset that OT said it could've been removed a couple days ago, with cues needed to stop for OT to assist for skin integrity purposes. No irritation noted upon removal.  Transported pt dependently in w/c > DABSC over toilet. With max cues for sequencing, pt utilized grab bar with LUE to stand with heavy min A, then heavy min A stand pivot with descent control on RLE noted,  however poor awareness of RUE with physical guarding needed due to RUE hemiparesis. Needed guarding/stability for RUE as well while sitting on commode with frequent dropping to the side of the [facility] without awareness. Required increased time, and with water running for auditory stimuli, pt was continent of urinary void only. Able to wipe while seated with posterior lean with supervision, then 2 sit > stands with initial max A fading to light min A with OT in front of pt blocking Rt knee but no buckling, overall max A to donn LB clothing over hips. Pt increasingly became frustrated with mobility given the "blindness" with max cues and max A needed to stand pivot with grab bar to w/c.  Pt remained seated in w/c, with belt alarm on/activated, RUE on pillow for hemi positioning and with all needs in reach at end of session.   Therapy Documentation Precautions:  Precautions Precautions: Fall Precaution Comments: right UE and LE hemiparesis. Restrictions Weight Bearing Restrictions: No    Therapy/Group: Individual Therapy   E Dewey Neukam, MS, OTR/L  08/14/2022, 12:12 PM

## 2022-08-14 NOTE — Progress Notes (Signed)
Patient ID: Melissa Colon, female   DOB: 1960-06-19, 62 y.o.   MRN: [id number]  Family has made the decision to move forward with d/c home on 8/2.

## 2022-08-14 NOTE — Progress Notes (Signed)
Physical Therapy Session Note  Patient Details  Name: Melissa Colon MRN: [id number] Date of Birth: 09-Nov-1960  Today's Date: 08/14/2022 PT Individual Time: 3086-5784 PT Individual Time Calculation (min): 77 min   Short Term Goals: Week 2:  PT Short Term Goal 1 (Week 2): Pt will perform supine<>sit using bed features per home set-up with supervision consistently PT Short Term Goal 2 (Week 2): Pt will perform bed<>chair transfers using LRAD with no more than mod assist consistently PT Short Term Goal 3 (Week 2): Pt will perform sit<>stands using LRAD with no more than mod assist consistently PT Short Term Goal 4 (Week 2): Pt will perform L hemi-propulsion w/c mobility at least 72ft with supervision  Skilled Therapeutic Interventions/Progress Updates:    Pt received supine in bed resting, but easily awakens and with encouragement is agreeable to therapy session. Pt reports being "tired." Pt already wearing R LE Aircast AirSport ASO to decrease risk of inversion ankle rolling - therapist doffed to assess skin and is noted to be intact; however, did note pt with bruising around R great toe MTP joint with pt reporting pain only when therapist applying pressure to that joint, denies pain with WBing activities - messaged MD to assess during rounds tomorrow.   Pt continues to report "I can't see" and with further questioning able to elaborate symptoms stating she sees "blinding sunlight" with and without her sunglasses. Throughout session, pt appears to have difficulty with visual acuity. Pt reported her visual changes to MD this AM during rounds.  Supine>sitting L EOB, HOB partially elevated and using L UE support on bedrail with supervision and increased time to scoot towards EOB.   R squat pivot EOB>w/c with mod assist for lifting/pivoting hips while therapist providing manual facilitation to position R LE and promote WBing through it to keep it stable during the transfer (due to sensory deficits  her foot tends to either slide back underneath her or shoot out forward) - provided step-by-step cuing for sequencing head/hips relationship as pt continues to demonstrate motor planning impairments.   Transported to/from gym in w/c for time management and energy conservation.   Block practice squat pivot transfers w/c<>EOM with pt continuing to have poor motor planning, poor recall of proper sequencing from previous sessions, and impaired problem solving to increase her independence - requires heavier assist to transfer towards L in this set-up compared to when pt has a bedrail for support - requires mod assist for lifting/pivoting hips in both directions with therapist continuing to provide manual facilitation for R LE positioning throughout - therapist attempts to provide step-by-step cuing for sequencing head/hips relationship to increase pt's independence; however, pt with difficulty understanding cuing and executing the motor plan. Do feel pt has been more successful in previous sessions when using bedrail compared to transferring to flat mat. Pt becomes upset with her struggles with this mobility task stating it is a "pain in the butt.Marland KitchenMarland KitchenI should be able to do this." Therapist provides emotional support and education on her impairments impacting her success.  Therapist provides education and max cuing for pt to manage w/c brakes and flipping armrest for transfers. Therapist places yellow coban on brakes to improve her ability to find them more independently with min improvement.  Donned dysem on bottom of L shoe to improve pt's traction on floor for w/c propulsion.  L hemi-technique w/c propulsion ~39ft total with supervision progressed to min A when fatigue and/or propelling on very, slight uneven flooring. Pt was excited with her success  with this today. Requires cuing for R attention to avoid running into obstacles on that side and to correct her path to navigate around them (sink and doorway).  Saw update from SW that family has decided for pt to D/C home on 8/2 rather than pursuing SNF; therefore, set-up custom manual wheelchair for patient on 7/31 at 1:00PM.   Patient's family was able to bring in pt's wheelchair and it is poorly fitting and does not allow pt to perform w/c propulsion.   Transported remainder of distance back to her room.  L squat pivot w/c>EOB using L UE support on bedrail with pt requiring mod A at this time due to pt with inability to now transfer motor plan back to using bedrail. Sit>supine with supervision using bedrail. Pt left supported upright in bed with needs in reach, bed alarm on, and meal tray set-up.    Therapy Documentation Precautions:  Precautions Precautions: Fall Precaution Comments: right UE and LE hemiparesis. Restrictions Weight Bearing Restrictions: No   Pain:  Only reports pain when thearpist applies pressure to R great toe MTP joint; otherwise, denies pain.   Therapy/Group: Individual Therapy  Ginny Forth , PT, DPT, NCS, CSRS 08/14/2022, 3:37 PM

## 2022-08-14 NOTE — Plan of Care (Signed)
  Problem: Consults Goal: RH STROKE PATIENT EDUCATION Description: See Patient Education module for education specifics  Outcome: Progressing   Problem: RH SAFETY Goal: RH STG ADHERE TO SAFETY PRECAUTIONS W/ASSISTANCE/DEVICE Description: STG Adhere to Safety Precautions With cues Assistance/Device. Outcome: Progressing   Problem: RH KNOWLEDGE DEFICIT Goal: RH STG INCREASE KNOWLEDGE OF HYPERTENSION Description: Patient and family will be able to manage HTN with medications and dietary modifications using educational resources independently Outcome: Progressing Goal: RH STG INCREASE KNOWLEGDE OF HYPERLIPIDEMIA Description: Patient and family will be able to manage HLD with medications and dietary modifications using educational resources independently Outcome: Progressing Goal: RH STG INCREASE KNOWLEDGE OF STROKE PROPHYLAXIS Description: Patient and family will be able to manage secondary risks with medications and dietary modifications using educational resources independently Outcome: Progressing   Problem: RH Vision Goal: RH LTG Vision (Specify) Outcome: Progressing   Problem: Education: Goal: Knowledge of General Education information will improve Description: Including pain rating scale, medication(s)/side effects and non-pharmacologic comfort measures Outcome: Progressing   Problem: Health Behavior/Discharge Planning: Goal: Ability to manage health-related needs will improve Outcome: Progressing   Problem: Clinical Measurements: Goal: Ability to maintain clinical measurements within normal limits will improve Outcome: Progressing Goal: Will remain free from infection Outcome: Progressing Goal: Diagnostic test results will improve Outcome: Progressing Goal: Respiratory complications will improve Outcome: Progressing Goal: Cardiovascular complication will be avoided Outcome: Progressing   Problem: Activity: Goal: Risk for activity intolerance will decrease Outcome:  Progressing   Problem: Nutrition: Goal: Adequate nutrition will be maintained Outcome: Progressing   Problem: Coping: Goal: Level of anxiety will decrease Outcome: Progressing   Problem: Elimination: Goal: Will not experience complications related to bowel motility Outcome: Progressing Goal: Will not experience complications related to urinary retention Outcome: Progressing   Problem: Pain Managment: Goal: General experience of comfort will improve Outcome: Progressing   Problem: Safety: Goal: Ability to remain free from injury will improve Outcome: Progressing   Problem: Skin Integrity: Goal: Risk for impaired skin integrity will decrease Outcome: Progressing

## 2022-08-14 NOTE — Progress Notes (Signed)
Physical Therapy Session Note  Patient Details  Name: Melissa Colon MRN: [id number] Date of Birth: October 10, 1960  Today's Date: 08/14/2022 PT Individual Time: 0802-0917 PT Individual Time Calculation (min): 75 min   Short Term Goals: Week 2:  PT Short Term Goal 1 (Week 2): Pt will perform supine<>sit using bed features per home set-up with supervision consistently PT Short Term Goal 2 (Week 2): Pt will perform bed<>chair transfers using LRAD with no more than mod assist consistently PT Short Term Goal 3 (Week 2): Pt will perform sit<>stands using LRAD with no more than mod assist consistently PT Short Term Goal 4 (Week 2): Pt will perform L hemi-propulsion w/c mobility at least 66ft with supervision  Skilled Therapeutic Interventions/Progress Updates:      Pt supine in bed upon arrival crying and reports she is unable to see anything and it began this morning. Notified nurse, PA and MD and interdisciplinary team. Vitals assessed BP 146/75 HR 110, decreasing to 100, RA O2 98-100. MD present to do full physical assessment, pt reports recovery of vision, but continues to report double/quadruple vision, MD and nurse report no new changes, and cleared to continue therapy.   Pt performed supine to sit with supervision, and scooted to EOB with min A with verbal and tactile cues for lateral trunk lean and reciprocal scoot.    Therapist donned R aircast ankle brace and shoes with total A.   Pt requesting to use bathroom. Pt performed squat pivot transfer to L hospital bed <> [facility] with therapist stabilizing L LE and R LE for positioning of R LE to reduce rolling of ankle. Pt continent of bladder. Pt performed sit to stand with mod A with therapist stabilizing R LE to maintain positioning and prevent buckling, with +2 assisting with donning and doffing diaper and pants.   Pt performed sit<>stand x5 with L UE support on WC and mod A with therapist stabilizing R LE to maintain positioning and prevent  buckling.   Pt seated in WC at end of session with all needs within reach and bed alarm on.   Therapy Documentation Precautions:  Precautions Precautions: Fall Precaution Comments: right UE and LE hemiparesis. Restrictions Weight Bearing Restrictions: No  Therapy/Group: Individual Therapy  [facility] [place], [place], DPT  08/14/2022, 7:52 AM

## 2022-08-14 NOTE — Progress Notes (Addendum)
Patient ID: Melissa Colon, female   DOB: 1960-02-17, 62 y.o.   MRN: [id number]  Hospital bed, wheelchair and drop arm commode ordered through [employer] on 7/26.

## 2022-08-15 ENCOUNTER — Inpatient Hospital Stay (HOSPITAL_COMMUNITY): Payer: Medicaid Other

## 2022-08-15 DIAGNOSIS — S90111A Contusion of right great toe without damage to nail, initial encounter: Secondary | ICD-10-CM

## 2022-08-15 LAB — GLUCOSE, CAPILLARY: Glucose-Capillary: 95 mg/dL (ref 70–99)

## 2022-08-15 MED ORDER — ADULT MULTIVITAMIN W/MINERALS CH: 1.0000 | ORAL_TABLET | Freq: Every day | ORAL | Status: AC

## 2022-08-15 MED ORDER — DOCUSATE SODIUM 100 MG PO CAPS: 100.0000 mg | ORAL_CAPSULE | Freq: Every day | ORAL | Status: DC

## 2022-08-15 NOTE — Patient Care Conference (Signed)
Inpatient RehabilitationTeam Conference and Plan of Care Update Date: 08/15/2022   Time: 10:47 AM    Patient Name: Melissa Colon      Medical Record Number: [id number]  Date of Birth: 1960/08/10 Sex: Female         Room/Bed: 4W24C/4W24C-01 Payor Info: Payor: [place] MEDICAID PREPAID HEALTH PLAN / Plan:  MEDICAID [place] COMPLETE HEALTH / Product Type: *No Product type* /    Admit Date/Time:  07/31/2022  5:22 PM  Primary Diagnosis:  Acute ischemic right MCA stroke [facility] [address])  Hospital Problems: Principal Problem:   Acute ischemic right MCA stroke [facility]) Active Problems:   Anemia    Expected Discharge Date: Expected Discharge Date: 08/17/22  Team Members Present: Physician leading conference: Dr. Claudette Laws Social Worker Present: Lavera Guise, BSW Nurse Present: Chana Bode, RN PT Present: Casimiro Needle, PT OT Present: Roney Mans, OT SLP Present: Feliberto Gottron, SLP PPS Coordinator present : Fae Pippin, SLP     Current Status/Progress Goal Weekly Team Focus  Bowel/Bladder   cont of B&B   remain cont of B&B   assist with toileting as needed    Swallow/Nutrition/ Hydration               ADL's   UB bathing/dressing supervision, LB slef-care mod A, mod/max A toileting. Limited by fear of falling and with recent emotional focus on "worsening vision." Needs lots of cues for sequencing and body positioning. Demos RUE inattention with several abraisions/cuts from lack of control noted   min A over all   Barriers- vision that increases fear with mobility, falling, and all ADL tasks. Plan- utilize tactile vs verbal or visual cues for increasing comfort with mobility and ADL tasks as a low vision strategy    Mobility   supervision bed mobility using hospital bed features, min/mod A sit<>stands using RW with high fear of falling, mod A squat pivot transfers, min A L hemi-propulsion w/c mobility up to 17ft, performing custom manual w/c evaluation on 7/31; pt  continues to have severe R hemibody sensory impairments and R inattention impating mobility   min A overall at primarily w/c level  family brought in pt's w/c (was a charity w/c) and has poor fit with pt unable to perform propulsion, planning custom w/c consult on 7/31; family participated in education and son demonstrated ability to assist patient with transfers; family educated on pt's need for ramped entrance to home and for hospial bed; however, family is declining these; pt also continues to have impaired motor planning, problem solving, and poor recall/carryover of education/training impacting her independence with mobility tasks    Communication                Safety/Cognition/ Behavioral Observations               Pain   mild pain reported, controlled with scheduled tylenol. new sensation of tingling and numbness in right foot   pain remains controlled   assess pain qshift and PRN    Skin   skin intact   skin remain intact  assess skin qshift and prn      Discharge Planning:  Family has decided to move forward with d/c home 8/2. HS, WC and Drop Arm Commode ordered. Pt and family prefer pt sleep on the couch due to it being easier for them to manage care needs. Spouse has declined the ramp due to length of ramp and cost of ramp.   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  Progress limited by behavior issues and functional status varies from min - max assist.  Patient on target to meet rehab goals: yes, currently needs min assist for ADLs and min - max for sit - stand pivots.  Goals for discharge set for min assist overall.   *See Care Plan and progress notes for long and short-term goals.   Revisions to Treatment Plan:  Neuro psych consult Xray toe; TDWB til xray complete Custom wheelchair    Teaching Needs: Safety, medications, dietary modifications, transfers, toileting, etc.   Current Barriers to Discharge: Decreased caregiver support, Home enviroment access/layout, and Behavior  Possible Resolutions to Barriers: Family education [facility] follow up services Hospital bed and ramp for entry to home declined Ambulance transport to home     Medical Summary Current Status: Right toe pain with bruising no apparent injury.  Patient with fluctuating awareness of visual deficits recent right occipital infarct  Barriers to Discharge: Weight bearing restrictions;Other (comments)  Barriers to Discharge Comments: Bruising along right first MTP joint line as well as swelling and pain with palpation Possible Resolutions to Barriers/Weekly Focus: Awaiting x-ray, weightbearing restrictions until reviewed, consider foot orthosis versus postop shoe   Continued Need for Acute Rehabilitation Level of Care: The patient requires daily medical management by a physician with specialized training in physical medicine and rehabilitation for the following reasons: Direction of a multidisciplinary physical rehabilitation program to maximize functional independence : Yes Medical management of patient stability for increased activity during participation in an intensive rehabilitation regime.: Yes Analysis of laboratory values and/or radiology reports with any subsequent need for medication adjustment and/or medical intervention. : Yes   I attest that I was present, lead the team conference, and concur with the assessment and plan of the team.   Chana Bode B 08/15/2022, 3:03 PM

## 2022-08-15 NOTE — Plan of Care (Signed)
  Problem: Consults Goal: RH STROKE PATIENT EDUCATION Description: See Patient Education module for education specifics  Outcome: Progressing   Problem: RH SAFETY Goal: RH STG ADHERE TO SAFETY PRECAUTIONS W/ASSISTANCE/DEVICE Description: STG Adhere to Safety Precautions With cues Assistance/Device. Outcome: Progressing   Problem: RH KNOWLEDGE DEFICIT Goal: RH STG INCREASE KNOWLEDGE OF HYPERTENSION Description: Patient and family will be able to manage HTN with medications and dietary modifications using educational resources independently Outcome: Progressing Goal: RH STG INCREASE KNOWLEGDE OF HYPERLIPIDEMIA Description: Patient and family will be able to manage HLD with medications and dietary modifications using educational resources independently Outcome: Progressing Goal: RH STG INCREASE KNOWLEDGE OF STROKE PROPHYLAXIS Description: Patient and family will be able to manage secondary risks with medications and dietary modifications using educational resources independently Outcome: Progressing   Problem: RH Vision Goal: RH LTG Vision (Specify) Outcome: Progressing   Problem: Education: Goal: Knowledge of General Education information will improve Description: Including pain rating scale, medication(s)/side effects and non-pharmacologic comfort measures Outcome: Progressing   Problem: Health Behavior/Discharge Planning: Goal: Ability to manage health-related needs will improve Outcome: Progressing   Problem: Clinical Measurements: Goal: Ability to maintain clinical measurements within normal limits will improve Outcome: Progressing Goal: Will remain free from infection Outcome: Progressing Goal: Diagnostic test results will improve Outcome: Progressing Goal: Respiratory complications will improve Outcome: Progressing Goal: Cardiovascular complication will be avoided Outcome: Progressing   Problem: Activity: Goal: Risk for activity intolerance will decrease Outcome:  Progressing   Problem: Nutrition: Goal: Adequate nutrition will be maintained Outcome: Progressing   Problem: Coping: Goal: Level of anxiety will decrease Outcome: Progressing   Problem: Elimination: Goal: Will not experience complications related to bowel motility Outcome: Progressing Goal: Will not experience complications related to urinary retention Outcome: Progressing   Problem: Pain Managment: Goal: General experience of comfort will improve Outcome: Progressing   Problem: Safety: Goal: Ability to remain free from injury will improve Outcome: Progressing   Problem: Skin Integrity: Goal: Risk for impaired skin integrity will decrease Outcome: Progressing

## 2022-08-15 NOTE — Progress Notes (Signed)
PROGRESS NOTE   Subjective/Complaints:  Per PT has bruise on R great toe.  Pt does not recall an injury , no reports from staff .  Pt denies pain with weight bearing but has sensory deficits RLE due to prior Left thalamic ICH   ROS- neg for fevers, abdominal pain CP, SOB, N/V/D, new motor or sensory changes, Headache +constipation-improved , double vision -unchanged  Objective:   No results found. Recent Labs    08/13/22 0741  WBC 9.8  HGB 8.6*  HCT 27.4*  PLT 383    Recent Labs    08/13/22 0741  NA 139  K 3.4*  CL 110  CO2 20*  GLUCOSE 130*  BUN 16  CREATININE 0.58  CALCIUM 8.5*     Intake/Output Summary (Last 24 hours) at 08/15/2022 1610 Last data filed at 08/15/2022 0746 Gross per 24 hour  Intake 240 ml  Output 150 ml  Net 90 ml        Physical Exam: Vital Signs Blood pressure 136/72, pulse 97, temperature 97.6 F (36.4 C), resp. rate 16, height 5\' 2"  (1.575 m), weight 68 kg, SpO2 100%.   General: No acute distress.  She is a little anxious about endoscopy results Heart: Regular rate and rhythm no rubs murmurs or extra sounds Lungs: Clear to auscultation, breathing unlabored, no rales or wheezes Abdomen: Positive bowel sounds, soft nontender to palpation, nondistended Extremities: No clubbing, cyanosis, or edema Skin: warm and dry Neurologic: Alert and awake, cranial nerves II through XII grossly intact, delayed responses-improved 3-/5 RIght delt bi tri grip HF 4- KE 3- ADF- unchanged  EOMI has double vision RIght lateral gaze which corrects with left lateral gaze, no nystagmus or vertical diplopia  Neuro:  Eyes without evidence of nystagmus  Tone is normal without evidence of spasticity   MSK- ecchymosis base of Right hallux with mild circumferential swelling, no pain with ROM, no other joint deformity, unable to sense proprioception or LT       Assessment/Plan: 1. Functional deficits  which require 3+ hours per day of interdisciplinary therapy in a comprehensive inpatient rehab setting. Physiatrist is providing close team supervision and 24 hour management of active medical problems listed below. Physiatrist and rehab team continue to assess barriers to discharge/monitor patient progress toward functional and medical goals  Care Tool:  Bathing    Body parts bathed by patient: Chest, Abdomen, Front perineal area, Right upper leg, Left upper leg, Face, Right arm   Body parts bathed by helper: Buttocks, Left arm, Left lower leg, Right lower leg     Bathing assist Assist Level: Moderate Assistance - Patient 50 - 74%     Upper Body Dressing/Undressing Upper body dressing   What is the patient wearing?: Pull over shirt    Upper body assist Assist Level: Minimal Assistance - Patient > 75%    Lower Body Dressing/Undressing Lower body dressing      What is the patient wearing?: Pants, Underwear/pull up     Lower body assist Assist for lower body dressing: Moderate Assistance - Patient 50 - 74%     Toileting Toileting Toileting Activity did not occur Press photographer and hygiene only): Refused  Toileting assist Assist for toileting: Maximal Assistance - Patient 25 - 49%     Transfers Chair/bed transfer  Transfers assist  Chair/bed transfer activity did not occur: Safety/medical concerns  Chair/bed transfer assist level: Moderate Assistance - Patient 50 - 74% Chair/bed transfer assistive device: Armrests, Bedrails   Locomotion Ambulation   Ambulation assist   Ambulation activity did not occur: Safety/medical concerns          Walk 10 feet activity   Assist  Walk 10 feet activity did not occur: Safety/medical concerns        Walk 50 feet activity   Assist Walk 50 feet with 2 turns activity did not occur: Safety/medical concerns         Walk 150 feet activity   Assist Walk 150 feet activity did not occur: Safety/medical  concerns         Walk 10 feet on uneven surface  activity   Assist Walk 10 feet on uneven surfaces activity did not occur: Safety/medical concerns         Wheelchair     Assist Is the patient using a wheelchair?: Yes Type of Wheelchair: Manual    Wheelchair assist level: Supervision/Verbal cueing, Minimal Assistance - Patient > 75% Max wheelchair distance: 60ft    Wheelchair 50 feet with 2 turns activity    Assist        Assist Level: Minimal Assistance - Patient > 75%   Wheelchair 150 feet activity     Assist      Assist Level: Dependent - Patient 0%   Blood pressure 136/72, pulse 97, temperature 97.6 F (36.4 C), resp. rate 16, height 5\' 2"  (1.575 m), weight 68 kg, SpO2 100%.  Medical Problem List and Plan: 1. 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 Formulized individual treatment plan based on medical history, underlying problem and comorbidities.   -estimated discharge 8/2- SNF pending   -MRI 7/23 with mixed acute and subacute infarcts, neurology following and checking antiplatelet resistance labs, permissive hypertension, appreciate assistance CT angiogram head and neck multifocal severe proximal P2 PCA stenosis with possible more distal left P2 PCA occlusion. Critical stenosis versus occlusion of the distal right ACA. Severe right P2 PCA new since 2021 - 2.  Antithrombotics: -DVT/anticoagulation:  Pharmaceutical: Lovenox -Discussed Lovenox for DVT  prevention             -antiplatelet therapy: Aspirin 81 mg daily and Plavix 75 mg daily , clopidigrel switched to ticagrelor based on antiplatelet resistance  3. Pain Management: Tylenol as needed   - 7/20: schedule tylenol 1000 mg TID. Has PRN robaxin.    - 7/21-8: no c/o pain; monitor  4. Mood/Behavior/Sleep: Provide emotional support             -antipsychotic agents: N/A 5. Neuropsych/cognition: This patient is capable of making decisions on her own behalf. 6. Skin/Wound Care: Routine skin checks 7. Fluids/Electrolytes/Nutrition: Routine in and outs with follow-up chemistries, asking  to have IV removed , had IV for EGD but not receiving meds or fluids at this time via IV  8.  Permissive hypertension.  Monitor with increased mobility    08/15/2022    4:26 AM 08/14/2022    7:20 PM 08/14/2022    1:22 PM  Vitals with BMI  Systolic 136 128 865  Diastolic 72 73 77  Pulse 97 100 107    7/28 BP well-controlled, heart rate elevated at times with mild tachy, monitor for orthostatic changes   9.  Hyperlipidemia.  Lipitor Lipid Panel     Component Value Date/Time   CHOL 184 07/28/2022 0523   TRIG 130 07/28/2022 0523   HDL 35 (L) 07/28/2022 0523   CHOLHDL 5.3 07/28/2022 0523   VLDL 26 07/28/2022 0523   LDLCALC 123 (H) 07/28/2022 0523    10.  History of alcohol tobacco use.  Counseling, nicoderm patch  11.  Overweight  BMI 28.47.  Dietary follow-up 12.  Medical noncompliance.  Counseling- doesn't like to take meds, esp HTN meds.   13.  Straining to have BM, will add colace    - LBM 7/23, pt reports still feeling constipated, will add senokot  -7/25 patient had BM yesterday however still feels constipated, will check abdominal x-ray `-7/26, last BM this morning.  X-ray yesterday with moderate stool burden  7/27 last BM this morning, continue to monitor  14.  Hypokalemia s/p supplementation recheck K + in am  - 3.2 7/19   - Add kdur 20 mg daily for 2 days, recheck Monday  -7/23 K+  stable at 4.2  -7/28 potassium stable at 4.1 15. Anemia  -    Latest Ref Rng & Units 08/13/2022    7:41 AM 08/12/2022    5:30 AM 08/11/2022    5:55 AM  CBC  WBC 4.0 - 10.5 K/uL 9.8  8.9  8.2   Hemoglobin 12.0 - 15.0 g/dL 8.6  8.6  9.7   Hematocrit 36.0 - 46.0 % 27.4  27.7  30.4   Platelets 150 - 400 K/uL 383  359  356     7/28 Hemoccult positive, patient had endoscopy today, small erosions with stigmata of recent bleeding were found.  GI recommends resume regular diet continue PPI, follow hemoglobin and transfuse if necessary.  If anemia persists colonoscopy will be needed.  Appreciate assistance   16. Dizziness/double vision intermittent. Pt reports this has been going on since CVA  RIght occipital infarcts noted on MRI 7/23- which may give visual disturbance but not diplopia  17.  Renal azotemia  -BUN up to 22 creatinine stable at 0.63.  Encourage fluid intake 18.  RIght great toe ecchymosis circumferential at base mild swelling pain with deep palpation but has sig sensory deficits, will check Xray , TTWB until xray completed  LOS: 15 days A FACE TO FACE EVALUATION WAS PERFORMED  Erick Colace 08/15/2022, 8:12 AM

## 2022-08-15 NOTE — Progress Notes (Signed)
Physical Therapy Session Note  Patient Details  Name: Melissa Colon MRN: [id number] Date of Birth: September 25, 1960  Today's Date: 08/15/2022 PT Individual Time:  -      Short Term Goals: Week 2:  PT Short Term Goal 1 (Week 2): Pt will perform supine<>sit using bed features per home set-up with supervision consistently PT Short Term Goal 2 (Week 2): Pt will perform bed<>chair transfers using LRAD with no more than mod assist consistently PT Short Term Goal 3 (Week 2): Pt will perform sit<>stands using LRAD with no more than mod assist consistently PT Short Term Goal 4 (Week 2): Pt will perform L hemi-propulsion w/c mobility at least 41ft with supervision  Skilled Therapeutic Interventions/Progress Updates:    Chart reviewed and pt agreeable to therapy. Pt received sitting in WC with 8 c/o pain. Session focused on functional transfers to promote safe home access. Pt initiated session with blocked practice of sit to stand using CGA + L hand on bed rail, with noted need to pull to stand. Pt noted to need strong VC for correct set up, including eye glasses placement to support double vision recovery and safety when visually scanning RLE placement. Pt completed further sit to stands with periodic decrease to modA + LUE on bed rail if pt not attentive to RLE placement. Pt then completed blocked practice of WC navigation, with pt completing 12ft + 91ft of WC navigation using minA for R side guidance. Pt noted to manage turns with S. Session education emphasized correct eye glass placement to support double vision recovery with blocking tape and need to support RUE to avoid pain in R shoulder joint. At end of session, pt was left in [facility] with alarm engaged, nurse call bell and all needs in reach.     Therapy Documentation Precautions:  Precautions Precautions: Fall Precaution Comments: right UE and LE hemiparesis. Restrictions Weight Bearing Restrictions: No General:      Therapy/Group:  Individual Therapy  Dionne Milo, PT, DPT 08/15/2022, 8:07 AM

## 2022-08-15 NOTE — Progress Notes (Signed)
Patient ID: Melissa Colon, female   DOB: 1960-11-08, 62 y.o.   MRN: [id number]  Team Conference Report to Patient/Family  Team Conference discussion was reviewed with the patient and caregiver, including goals, any changes in plan of care and target discharge date.  Patient and caregiver express understanding and are in agreement.  The patient has a target discharge date of 08/17/22.  SW met with patient and spoke with patient and brother, [name]. SW informed both that patient is on target for her discharge Friday. No questions or concerns currently. Patient  excited. Sw will continue to FU.   Andria Rhein 08/15/2022, 3:13 PM

## 2022-08-15 NOTE — Progress Notes (Signed)
Physical Therapy Session Note  Patient Details  Name: Melissa Colon MRN: [id number] Date of Birth: 1960/12/22  Today's Date: 08/15/2022 PT Individual Time: 1310-1408 PT Individual Time Calculation (min): 58 min   Short Term Goals: Week 2:  PT Short Term Goal 1 (Week 2): Pt will perform supine<>sit using bed features per home set-up with supervision consistently PT Short Term Goal 2 (Week 2): Pt will perform bed<>chair transfers using LRAD with no more than mod assist consistently PT Short Term Goal 3 (Week 2): Pt will perform sit<>stands using LRAD with no more than mod assist consistently PT Short Term Goal 4 (Week 2): Pt will perform L hemi-propulsion w/c mobility at least 56ft with supervision  Skilled Therapeutic Interventions/Progress Updates:    Per Jesusita Oka, PA, pt's R foot X-ray results show "no fracture or dislocation" and pt is now approved for WBAT.   Pt received sitting in w/c and agreeable to therapy session with plan for custom manual wheelchair consultation.  While assisting with shoe management, noticed pt's feet with excessive dry skin - educated pt on importance of skin hygiene. Sitting on EOM, set-up pt with water basins to perform foot hygiene - pt would benefit from long handle sponge to perform this task more independently due to impaired sitting balance and inability to reach feet fully - requires total assist to complete task.   Donned socks, shoes, and R LE Aircast AirSport ASO to protect her ankle from inversion rolling during transfers.   [name], ATP from [employer] present for wheelchair consult. Discussed the following recommendations with ATP and patient regarding her wheelchair needs:  - K5 ultra-lightweight manual wheelchair with adjustable axle to increase the stability of the chair and improved pt's access to the wheels and floor for L hemi-propulsion - need for pressure relieving and positioning cushion with low maintenance to decrease caregiver burden  while ensuring pt's safety - need for hard shell back with min contour to promote optimal spine alignment for increased pt tolerance to upright positioning in the wheelchair - need for low floor-to-seat height to allow improved L foot contact with the floor for hemi-propulsion  - need for full length armrest pad for proper positioning and support of R UE due to hypotonicity and paresis  Pt in agreement with wheelchair recommendations.  Performed squat pivot transfer w/c<>EOM with mod assist for lifting/pivoting hips, maintaining R LE positioning on the ground with therapist facilitating WBing to stabilize R foot placement (otherwise due to sensory deficits will slide foot back or forward out from under her BOS) - pt continues to have poor motor planning, poor problem solving, and limited to no carryover of education on proper sequencing of transfers from prior sessions.   At end of session, pt left seated in w/c with needs in reach and seat belt alarm on.    Therapy Documentation Precautions:  Precautions Precautions: Fall Precaution Comments: right UE and LE hemiparesis. Restrictions Weight Bearing Restrictions: No   Pain:  No reports of pain throughout session.    Therapy/Group: Individual Therapy  Ginny Forth , PT, DPT, NCS, CSRS 08/15/2022, 1:14 PM

## 2022-08-15 NOTE — Progress Notes (Signed)
Occupational Therapy Session Note  Patient Details  Name: Melissa Colon MRN: [id number] Date of Birth: [birth year]  Today's Date: 08/15/2022 OT Individual Time: 1417-1530 OT Individual Time Calculation (min): 73 min    Short Term Goals: Week 2:  OT Short Term Goal 1 (Week 2): Pt will utilize compensatory strategies for diplopia with min verbal cues OT Short Term Goal 2 (Week 2): Pt will complete toileting mod A OT Short Term Goal 3 (Week 2): Pt will attend to R visual field/ R hemi-body with min verbal cues consistently  Skilled Therapeutic Interventions/Progress Updates:  Pt greeted seated in w/c, pt agreeable to OT intervention. Session focus on BADL reeducation, functional mobility, RUE coordination/ motor planning and decreasing overall caregiver burden.                     Pt reports need to wash up, pt set- up at sink for bathing. Pt needed MAX A to doff OH shirt. Pt washed UB with overall MINA needing assist to elevate RUE while pt washed her underarm. Pt donned new shirt with MINA. Pt completed sit>stand from w/c at sink with heavy MODA to wash LB, pt reported increased pain in RUE needing to sit back down. Pt reports this has been an ongoing issue. Declined LB bathing d/t increased pain when standing.   Total A transport to gym where pt completed blocked practice of squat pivot transfers to R and L side from w/c. Pt needed MAX cues to recall w/c parts to remove during transfer, pt completed transfers with MOD A progressing to [facility].   Worked on functional reaching with RUE to target with an emphasis on compensatory strategies d/t visual deficits however pt needed hand over hand assist to locate bean bag on mat table on both R and L side.  Did remove a small portion of opaque tape on L glasses lenses as pt reports "its been on there for over a year." Reinforced method of slowly removing tape from glasses over time to decrease double vision stimulus. Impaired motor planning noted with  RUE as well as decreased FMC.   Ended session with pt supine in bed with all needs within reach and bed alarm activated.                    Therapy Documentation Precautions:  Precautions Precautions: Fall Precaution Comments: right UE and LE hemiparesis. Restrictions Weight Bearing Restrictions: No  Pain: Unrated pain reported in RUE, offered heat/ice with pt declining. Rest breaks provided as needed.    Therapy/Group: Individual Therapy  Barron Schmid 08/15/2022, 3:47 PM

## 2022-08-16 ENCOUNTER — Encounter (HOSPITAL_COMMUNITY): Payer: Self-pay | Admitting: Gastroenterology

## 2022-08-16 ENCOUNTER — Other Ambulatory Visit (HOSPITAL_COMMUNITY): Payer: Self-pay

## 2022-08-16 MED ORDER — METHOCARBAMOL 750 MG PO TABS
750.0000 mg | ORAL_TABLET | Freq: Three times a day (TID) | ORAL | 0 refills | Status: DC | PRN
  Filled 2022-08-16: qty 60, 20d supply, fill #0

## 2022-08-16 MED ORDER — DICLOFENAC SODIUM 1 % EX GEL
2.0000 g | Freq: Four times a day (QID) | CUTANEOUS | 0 refills | Status: AC
  Filled 2022-08-16: qty 100, 13d supply, fill #0

## 2022-08-16 MED ORDER — FOLIC ACID 1 MG PO TABS
1.0000 mg | ORAL_TABLET | Freq: Every day | ORAL | 0 refills | Status: AC
  Filled 2022-08-16: qty 30, 30d supply, fill #0

## 2022-08-16 MED ORDER — ATORVASTATIN CALCIUM 80 MG PO TABS
80.0000 mg | ORAL_TABLET | Freq: Every day | ORAL | 0 refills | Status: AC
  Filled 2022-08-16: qty 30, 30d supply, fill #0

## 2022-08-16 MED ORDER — PANTOPRAZOLE SODIUM 40 MG PO TBEC
40.0000 mg | DELAYED_RELEASE_TABLET | Freq: Every day | ORAL | 0 refills | Status: DC
  Filled 2022-08-16: qty 30, 30d supply, fill #0

## 2022-08-16 MED ORDER — POLYETHYLENE GLYCOL 3350 17 G PO PACK: 17.0000 g | PACK | Freq: Every day | ORAL | Status: AC

## 2022-08-16 MED ORDER — MECLIZINE HCL 25 MG PO TABS
25.0000 mg | ORAL_TABLET | Freq: Three times a day (TID) | ORAL | 0 refills | Status: DC | PRN
  Filled 2022-08-16: qty 30, 10d supply, fill #0

## 2022-08-16 MED ORDER — TICAGRELOR 90 MG PO TABS
90.0000 mg | ORAL_TABLET | Freq: Two times a day (BID) | ORAL | 0 refills | Status: AC
  Filled 2022-08-16: qty 60, 30d supply, fill #0

## 2022-08-16 NOTE — Progress Notes (Signed)
Patient ID: Melissa Colon, female   DOB: 05/04/1960, 62 y.o.   MRN: [id number]  [facility] referral sent to [facility]

## 2022-08-16 NOTE — Plan of Care (Signed)
  Problem: RH Balance Goal: LTG: Patient will maintain dynamic sitting balance (OT) Description: LTG:  Patient will maintain dynamic sitting balance with assistance during activities of daily living (OT) Outcome: Completed/Met Goal: LTG Patient will maintain dynamic standing with ADLs (OT) Description: LTG:  Patient will maintain dynamic standing balance with assist during activities of daily living (OT)  Outcome: Completed/Met   Problem: Sit to Stand Goal: LTG:  Patient will perform sit to stand in prep for activites of daily living with assistance level (OT) Description: LTG:  Patient will perform sit to stand in prep for activites of daily living with assistance level (OT) Outcome: Completed/Met   Problem: RH Eating Goal: LTG Patient will perform eating w/assist, cues/equip (OT) Description: LTG: Patient will perform eating with assist, with/without cues using equipment (OT) Outcome: Completed/Met   Problem: RH Grooming Goal: LTG Patient will perform grooming w/assist,cues/equip (OT) Description: LTG: Patient will perform grooming with assist, with/without cues using equipment (OT) Outcome: Completed/Met   Problem: RH Bathing Goal: LTG Patient will bathe all body parts with assist levels (OT) Description: LTG: Patient will bathe all body parts with assist levels (OT) Outcome: Completed/Met   Problem: RH Dressing Goal: LTG Patient will perform upper body dressing (OT) Description: LTG Patient will perform upper body dressing with assist, with/without cues (OT). Outcome: Completed/Met Goal: LTG Patient will perform lower body dressing w/assist (OT) Description: LTG: Patient will perform lower body dressing with assist, with/without cues in positioning using equipment (OT) Outcome: Completed/Met   Problem: RH Vision Goal: RH LTG Vision (Specify) Outcome: Completed/Met   Problem: RH Functional Use of Upper Extremity Goal: LTG Patient will use RT/LT upper extremity as a  (OT) Description: LTG: Patient will use right/left upper extremity as a stabilizer/gross assist/diminished/nondominant/dominant level with assist, with/without cues during functional activity (OT) Outcome: Completed/Met   Problem: RH Toilet Transfers Goal: LTG Patient will perform toilet transfers w/assist (OT) Description: LTG: Patient will perform toilet transfers with assist, with/without cues using equipment (OT) Outcome: Completed/Met Note: Pt able to complete squat pivot transfers to toilet min A when motivated.    Problem: RH Memory Goal: LTG Patient will demonstrate ability for day to day recall/carry over during activities of daily living with assistance level (OT) Description: LTG:  Patient will demonstrate ability for day to day recall/carry over during activities of daily living with assistance level (OT). Outcome: Completed/Met   Problem: RH Toileting Goal: LTG Patient will perform toileting task (3/3 steps) with assistance level (OT) Description: LTG: Patient will perform toileting task (3/3 steps) with assistance level (OT)  08/16/2022 1624 by Army Fossa, OT 08/16/2022 1623 by Army Fossa, OT Outcome: Not Met (add Reason) 08/16/2022 1616 by Army Fossa, OT Note: Pt continues to require mod A for toileting d/t decreased safety awareness and balance deficits. Pt's family is available to provide the necessary assistance at d/c.

## 2022-08-16 NOTE — Progress Notes (Signed)
Occupational Therapy Discharge Summary  Patient Details  Name: Melissa Colon MRN: [id number] Date of Birth: 05-May-1960  Date of Discharge from OT service:August 16, 2022  Today's Date: 08/16/2022 AM Session:  OT Individual Time: 0805-0900 OT Individual Time Calculation (min): 55 min  PM session:  OT Individual Time: 1400-1429 OT Individual Time Calculation (min): 29 min   Patient has met 12 of 13 long term goals due to improved activity tolerance, improved balance, postural control, ability to compensate for deficits, functional use of  RIGHT upper and RIGHT lower extremity, improved attention, and improved awareness.  Patient to discharge at overall min-Mod Assist level.  Patient's care partner is independent to provide the necessary physical and cognitive assistance at discharge.    Reasons goals not met: Pt continues to require mod A for toileting d/t decreased safety awareness and balance deficits. Pt's family is available to provide the necessary level of assistance at d/c.   Recommendation:  Patient will benefit from ongoing skilled OT services in home health setting to continue to advance functional skills in the area of BADL and Reduce care partner burden.  Equipment: No equipment provided. Pt owns a BSC and plans to utilize at d/c.   Reasons for discharge: treatment goals met and discharge from hospital  Patient/family agrees with progress made and goals achieved: Yes  OT Discharge Skilled Therapeutic Interventions/Progress Updates:  AM Session:  Pt received sitting up in wc presenting to be in good spirits receptive to skilled OT session reporting 3/10 pain in R UE- OT offering intermittent rest breaks, repositioning, and therapeutic support to optimize participation in therapy session. Provided education on impact of R chronic shoulder subluxation on pain and R UE functional use with Pt verbalizing understanding. Educated Pt on R UE positioning during rest, sleep, and  functional transfers to decrease/prevent pain and to prevent further subluxation. Trialed using a R UE sling to support arm during functional transfers and BADLs this session with Pt verbalizing it assisted with pain management. Spoke to PT following session to support carry over with R UE positioning. Pt receptive to getting cleaned up at sink with sponge bath this session. Doffed shirt min A with increased time and mod verbal cues. Pt able to perform UB bathing with min A to wash L UE and for R UE positioning. Donned deodorant following with min A. Pt stood with heavy min A at sink to bring pants from waist, however pt reporting pain in knee and declining to stand again during session. Notified RN of Pt's pain and Pt's request for pain medications. Educated Pt on long-handled sponge use to wash lower B LEs with Pt receptive to education and demonstrating teach back as evidence of learning. Pt able to wash lower B LEs with supervision. Pt requesting to not wash upper legs and bottom this session d/t pain in knee when standing. Pt continuing to report double vision during session with education provided on compensatory techniques and keeping glasses on with L lens partially occluded to manage symptoms with Pt verbalizing understanding. Pt was left resting in wc with call bell in reach, seat belt alarm on, and all needs met.   PM Session:  Pt received sitting up in wc presenting to be in good spirits, motivated to return home receptive to skilled OT session reporting 0/10 pain- OT offering intermittent rest breaks, repositioning, and therapeutic support to optimize participation in therapy session. Focus this session providing Pt home HEP with handout with written instructions and pictures provided. OT provided  demonstration of each exercise as visual model with Pt able to complete each movement with mod verbal cues for technique. Recommending Pt complete HEP with assistance from family 1x daily d/t visual and  cognitive deficits with Pt's family providing a visual model of each movement. All exercises completed with fingers interlocked to allow Pt to provide active assist with L UE. Pt completed 1x10 reps of the following exercises/stretches:  -Anterior Shoulder Flexion -"Rock the baby" stretch -Shoulder rotation stretch -Bicep flexion/tricep extension -Forearm supination/pronation stretch  -Wrist flexion/extension stretch Pt was left resting in wc with call bell in reach, seat belt alarm on, and all needs met.   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 owns a [employer]) Eating: Set up Where Assessed-Eating: Chair Grooming: Setup Where Assessed-Grooming: Sitting at sink Upper Body Bathing: Minimal assistance Where Assessed-Upper Body Bathing: Sitting at sink, Wheelchair Lower Body Bathing: Minimal assistance Where Assessed-Lower Body Bathing: Wheelchair, Sitting at sink Upper Body Dressing: Minimal assistance Where Assessed-Upper Body Dressing: Wheelchair Lower Body Dressing: Minimal assistance Where Assessed-Lower Body Dressing: Wheelchair, Sitting at sink Toileting: Moderate assistance Where Assessed-Toileting: Teacher, adult education: Curator Method: Ambulance person: Bedside commode, Engineer, technical sales: Not assessed Film/video editor: Not assessed Film/video editor Method:  (Pt reports she completes sponge baths at  baseline) ADL Comments: Pt plans to complete sponge baths at d/c as she was completing sponge baths at baseline as she does not enjoy showers Vision Baseline Vision/History: 1 Wears glasses (reports diplopia at baseline) Patient Visual Report: Diplopia Vision Assessment?: Yes Eye Alignment: Impaired (comment) (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) Ocular Range of Motion: Restricted looking up;Restricted on the right Alignment/Gaze Preference: Chin down Tracking/Visual Pursuits: Decreased smoothness of horizontal tracking;Decreased smoothness of vertical tracking;Other (comment) (eyes remainded misaligned while visually tracking with R eye abducted) Convergence: Impaired (comment) Visual Fields: Left visual field deficit (potential L visual field cut vs L innatention with Pt unable to verbalize seeming stimulus until midline) Diplopia Assessment: Objects split side to side;Other (comment) (Pt reporting intermittent double to quadrouple vision) Depth Perception: Undershoots Additional Comments: Pt with hx of intermittent diplopia since CVA in 2021 and reports it is more consistent now with all vision and sometimes sees quadruple vision Perception  Perception: Impaired Inattention/Neglect: Does not attend to right side of body (mild impairment, imprvement from baseline) Praxis Praxis: Impaired Praxis Impairment Details: Motor planning Cognition Cognition Overall Cognitive Status: Impaired/Different from baseline Arousal/Alertness: Awake/alert Orientation Level: Person;Place;Situation Person: Oriented Place: Oriented Situation: Oriented Memory: Impaired Memory Impairment: Decreased short term memory;Decreased recall of new information;Retrieval deficit Decreased Short Term Memory: Functional basic;Verbal basic Attention: Focused;Sustained;Selective Focused Attention: Appears intact Sustained Attention: Appears intact Selective Attention Impairment: Functional basic Awareness:  Impaired Awareness Impairment: Intellectual impairment Problem Solving: Impaired Problem Solving Impairment: Functional basic;Verbal basic Executive Function: Self Monitoring Self Monitoring: Impaired Self Monitoring Impairment: Functional basic Behaviors: Other (comment) (mild emtional lability and self limiting) Safety/Judgment: Impaired Brief Interview for Mental Status (BIMS) Repetition of Three Words (First Attempt): 3 Temporal Orientation: Year: No answer (Pt reported "I don't  keep up with that") Temporal Orientation: Month: Accurate within 5 days Temporal Orientation: Day: Correct Recall: "Sock": No, could not recall Recall: "Blue": Yes, no cue required Recall: "Bed": Yes, no cue required BIMS Summary Score: 10 Sensation Sensation Light Touch: Impaired Detail Central sensation comments: severe sensory deficit in R hemibody Light Touch Impaired Details: Absent RLE;Absent RUE Hot/Cold: Not tested Proprioception: Impaired Detail Proprioception Impaired Details: Absent RUE;Absent RLE Stereognosis: Not tested Coordination Gross Motor Movements are Fluid and Coordinated: No Coordination and Movement Description: impaired due to R hemiparesis in combination with severe sensory deficits Finger Nose Finger Test: slowed motor movements with motor planning challenges on R side 9 Hole Peg Test: unable to complete Motor  Motor Motor: Hemiplegia;Abnormal postural alignment and control;Abnormal tone Motor - Discharge Observations: severely impaired R hemibody sensory deficits with R hemiparesis Mobility  Bed Mobility Bed Mobility: Sit to Supine;Supine to Sit Supine to Sit: Supervision/Verbal cueing Sit to Supine: Supervision/Verbal cueing Transfers Sit to Stand: Minimal Assistance - Patient > 75% Stand to Sit: Minimal Assistance - Patient > 75%  Trunk/Postural Assessment  Cervical Assessment Cervical Assessment: Exceptions to [facility] (forward head) Thoracic Assessment Thoracic  Assessment: Exceptions to [facility] (rounded shoulders) Lumbar Assessment Lumbar Assessment: Exceptions to [facility] (posterior pelvic tilt) Postural Control Postural Control: Deficits on evaluation Righting Reactions: delayed.  Balance Balance Balance Assessed: Yes Static Sitting Balance Static Sitting - Balance Support: Feet supported;Left upper extremity supported Static Sitting - Level of Assistance: 6: Modified independent (Device/Increase time) Dynamic Sitting Balance Dynamic Sitting - Balance Support: Feet unsupported;Left upper extremity supported Dynamic Sitting - Level of Assistance: 5: Stand by assistance Static Standing Balance Static Standing - Balance Support: Bilateral upper extremity supported Static Standing - Level of Assistance: 4: Min assist Dynamic Standing Balance Dynamic Standing - Balance Support: Bilateral upper extremity supported;During functional activity Dynamic Standing - Level of Assistance: 4: Min assist Extremity/Trunk Assessment RUE Assessment RUE Assessment: Exceptions to [facility] Passive Range of Motion (PROM) Comments: WFL; did not range past 90* d/t chronic subluxation Active Range of Motion (AROM) Comments: decreased shoudler flexion and supination General Strength Comments: 3/5 overall LUE Assessment LUE Assessment: Within Functional Limits General Strength Comments: decreased strength d/t generalized deconditioning 4/5    A  08/16/2022, 4:30 PM

## 2022-08-16 NOTE — Discharge Summary (Signed)
Physical Therapy Discharge Summary  Patient Details  Name: Melissa Colon MRN: [id number] Date of Birth: Sep 21, 1960  Date of Discharge from PT service:August 16, 2022  Today's Date: 08/16/2022 PT Individual Time: 6270-3500 PT Individual Time Calculation (min): 15 min   and  Today's Date: 08/16/2022 PT Missed Time: 45 Minutes Missed Time Reason: Patient fatigue;Patient unwilling to participate   Patient has met 4 of 8 long term goals due to improved activity tolerance, improved balance, increased strength, ability to compensate for deficits, functional use of  right upper extremity and right lower extremity, improved attention, and improved awareness.  Patient to discharge at a wheelchair level requiring Min/Mod Assist for squat pivot transfers. Patient's care partners attended hands-on education/training and are independent to provide the necessary physical and cognitive assistance at discharge.  Reasons goals not met: Patient continues to require varying min to mod assist for transfers and standing balance due to R hemibody sensory deficits in combination with impaired motor planning, decreased awareness, R inattention, and decreased carryover of education/training. Pt continues to require up to min A for wheelchair propulsion in new, small spaces due to R inattention and impaired motor planning.  Recommendation:  Patient will benefit from ongoing skilled PT services in home health setting to continue to advance safe functional mobility, address ongoing impairments in motor planning, R attention, transfers, wheelchair mobility, standing tolerance/balance, and minimize fall risk.  Equipment: Warden/ranger wheelchair through [employer], hospital bed Family unable/declined having ramp installed for home access; therefore, pt will continue to require ambulance medical transport to all appointments.  Reasons for discharge: treatment goals met and discharge from hospital  Patient/family  agrees with progress made and goals achieved: Yes  Skilled Therapeutic Interventions/Progress Updates:  Pt received supine in bed awake, with TV on and upon inquiring to participate in therapy session pt states "I'm afraid to because of my double vision." Pt goes on to state "I'm scared because I can't see." Pt continues to report double and quadruple vision. Pt states "I was hoping this would straighten itself out, but it hasn't." Therapist provides emotional support and encouragement on participating in OOB mobility and educating her that we were able to successfully mobilize this morning despite double vision at that time; however, pt continues to voice concern and politely requests to stay in the bed at this time. Therapist inquired to ensure pt with no questions/concerns regarding D/C and educated pt on plan for ambulance transport home tomorrow. Pt left supine in bed with needs in reach and bed alarm on. Missed 45 minutes of skilled physical therapy.  PT Discharge Precautions/Restrictions Precautions Precautions: Fall Precaution Comments: R hemiparesis, severe R hemibody sensory deficits, impaired motor planning Restrictions Weight Bearing Restrictions: No Pain Pain Assessment Pain Scale: 0-10 Pain Score: 0-No pain Pain Interference Pain Interference Pain Effect on Sleep: 1. Rarely or not at all Pain Interference with Therapy Activities: 1. Rarely or not at all Pain Interference with Day-to-Day Activities: 1. 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) Ocular Range of Motion: Restricted on the right;Other (comment);Restricted looking up Alignment/Gaze Preference: Chin down Tracking/Visual Pursuits: Decreased smoothness of horizontal tracking;Decreased smoothness of vertical tracking;Other (comment) (eyes remaiend misaligned while tracking  with R eye abducted) Saccades: Other (comment) (pt with difficulty finding the target to perform test, so unable to assess) Diplopia Assessment: Objects split side to side;Other (comment) (reports double  to quadruple vision) Additional Comments: pt witih hx of intermittent diplopia since CVA in 2021 and reports it is more consistent now with all vision and sometimes she sees quadruple vision. Perception Perception: Impaired Inattention/Neglect: Does not attend to right side of body (mild impairment) Praxis Praxis: Impaired Praxis Impairment Details: Motor planning  Cognition Overall Cognitive Status: Impaired/Different from baseline Arousal/Alertness: Awake/alert Orientation Level: Oriented to person;Oriented to place;Oriented to situation (pt reports she doesn't pay attention to date at baseline) Year:  (doesn't know) Month: August (requires question cuing to recall her birthday was a few days before in order to identify correct month) Day of Week: Incorrect Attention: Focused;Sustained;Selective Focused Attention: Appears intact Sustained Attention: Appears intact Selective Attention: Impaired Memory: Impaired Awareness: Impaired Problem Solving: Impaired Safety/Judgment: Impaired Sensation Sensation Light Touch: Impaired Detail Central sensation comments: severe sensory deficit in R hemibody Light Touch Impaired Details: Absent RLE;Absent RUE Hot/Cold: Not tested Proprioception: Impaired Detail Proprioception Impaired Details: Absent RUE;Absent RLE Stereognosis: Not tested Coordination Gross Motor Movements are Fluid and Coordinated: No Coordination and Movement Description: impaired due to R hemiparesis in combination with severe sensory deficits Motor  Motor Motor: Hemiplegia;Abnormal postural alignment and control;Abnormal tone Motor - Discharge Observations: severely impaired R hemibody sensory deficits with R hemiparesis  Mobility Bed Mobility Bed Mobility: Sit to  Supine;Supine to Sit Supine to Sit: Supervision/Verbal cueing Sit to Supine: Supervision/Verbal cueing Transfers Transfers: Sit to Stand;Stand to Sit;Squat Pivot Transfers Sit to Stand: Minimal Assistance - Patient > 75%;Moderate Assistance - Patient 50-74% Stand to Sit: Minimal Assistance - Patient > 75%;Moderate Assistance - Patient 50-74% Squat Pivot Transfers: Moderate Assistance - Patient 50-74%;Minimal Assistance - Patient > 75% Transfer (Assistive device): Other (Comment) (w/c armrest) Locomotion  Gait Ambulation: No (has participated in some gait training at L hallway rail but is not a functional ambulator) Stairs / Additional Locomotion Stairs: No Wheelchair Mobility Wheelchair Mobility: Yes Wheelchair Assistance: Minimal assistance - Patient >75% Wheelchair Propulsion: Left upper extremity;Left lower extremity Wheelchair Parts Management: Needs assistance Distance: 124ft  Trunk/Postural Assessment  Cervical Assessment Cervical Assessment: Exceptions to [facility] (forward head) Thoracic Assessment Thoracic Assessment: Exceptions to [facility] (rounded shoulders with thoracic kyphosis) Lumbar Assessment Lumbar Assessment: Exceptions to [facility] (posterior pelvic tilt) Postural Control Postural Control: Deficits on evaluation Righting Reactions: delayed  Balance Balance Balance Assessed: Yes Static Sitting Balance Static Sitting - Balance Support: Feet supported;Left upper extremity supported Static Sitting - Level of Assistance: 5: Stand by assistance Dynamic Sitting Balance Dynamic Sitting - Balance Support: Feet unsupported;Left upper extremity supported Dynamic Sitting - Level of Assistance: 5: Stand by assistance Static Standing Balance Static Standing - Balance Support: Left upper extremity supported Static Standing - Level of Assistance: 4: Min assist Dynamic Standing Balance Dynamic Standing - Balance Support: During functional activity;Left upper extremity supported Dynamic  Standing - Level of Assistance: 4: Min assist;3: Mod assist Extremity Assessment      RLE Assessment RLE Assessment: Exceptions to [facility] Active Range of Motion (AROM) Comments: WFL/WNL General Strength Comments: assessed in supine RLE Strength Right Hip Flexion: 3+/5 Right Hip Extension: 4-/5 Right Knee Flexion: 3+/5 Right Knee Extension: 4-/5 Right Ankle Dorsiflexion: 3+/5 Right Ankle Plantar Flexion: 4-/5 LLE Assessment LLE Assessment: Exceptions to [facility] Active Range of Motion (AROM) Comments: WFL/WNL General Strength Comments: assessed in supine LLE Strength Left Hip Flexion: 4+/5 Left Knee Flexion: 4+/5 Left Knee Extension: 4+/5 Left Ankle Dorsiflexion: 4+/5 Left Ankle Plantar Flexion: 4+/5    M  , PT, DPT, NCS, CSRS 08/16/2022, 9:57 AM

## 2022-08-16 NOTE — Progress Notes (Signed)
Physical Therapy Session Note  Patient Details  Name: Melissa Colon MRN: [id number] Date of Birth: 03/26/1960  Today's Date: 08/16/2022 PT Individual Time: 0933-1030 PT Individual Time Calculation (min): 57 min   Short Term Goals: Week 2:  PT Short Term Goal 1 (Week 2): Pt will perform supine<>sit using bed features per home set-up with supervision consistently PT Short Term Goal 2 (Week 2): Pt will perform bed<>chair transfers using LRAD with no more than mod assist consistently PT Short Term Goal 3 (Week 2): Pt will perform sit<>stands using LRAD with no more than mod assist consistently PT Short Term Goal 4 (Week 2): Pt will perform L hemi-propulsion w/c mobility at least 67ft with supervision  Skilled Therapeutic Interventions/Progress Updates:    Pt received seated in w/c and agreeable to therapy session. Therapist educating pt on plan for D/C home tomorrow due to pt having poor recall of this information. Pt accidentally pulled her R shoe off her foot on the w/c leg rest with no awareness nor sensory input of this - therapist made her aware and provided total assist to don. Throughout session, pt perseverating on the fact that she is having double to quadruple vision today - pt's R eye remains slightly abducted compared to her L - she denies any improvement nor decline of double vision when wearing glasses with L lens occluded.  Therapist reinforcing pt education on w/c brakes and armrest management for transfers during session - requires mod/max cuing and increased time to find these w/c parts with pt using her L hand to feel for them due to visual impairments.  Attempted L hemi-propulsion in room but due to the w/c being ~1inch too tall and pt having tennis shoes with poor grip, she requires min to mod A to navigate in room. Requires max/total A to position w/c for transfer.   Performed squat pivot transfers w/c<>EOB without using bedrail due to pt's family declining hospital bed for  at home, but did educate pt on recommendation to use L UE support on armrest of couch. Pt does best when therapist provides increased facilitation at both of her hips and reinforcing education on how to pivot by guiding the directions her shoulders need to turn (due to pt not responding to tactile cuing on R due to sensory deficits).   Attempted to trial pt's personal charity w/c, but the transfer was unsafe due to it being too tall and her cushion sliding in the seat (despite [employer] lowering this w/c yesterday). Therapist reached out to [employer] to make him aware of pt's need for an appropriate loaner wheelchair to use at home until her custom wheelchair can be ordered. Made SW aware of this plan to also update family as needed.  At end of session, pt left seated in w/c with needs in reach, seat belt alarm on, and R UE supported on pillows.    Therapy Documentation Precautions:  Precautions Precautions: Fall Precaution Comments: right UE and LE hemiparesis. Restrictions Weight Bearing Restrictions: No   Pain:  Reports 1x instances of some L knee pain, dissipated, pt states this "happens sometimes." Provided rest breaks and repositioning for pain management.    Therapy/Group: Individual Therapy  Ginny Forth , PT, DPT, NCS, CSRS 08/16/2022, 7:50 AM

## 2022-08-16 NOTE — Progress Notes (Signed)
PROGRESS NOTE   Subjective/Complaints: Pt wants to go home today , discussed d/c in am  Xray right great toe neg   ROS- neg for fevers, abdominal pain CP, SOB, N/V/D, new motor or sensory changes, Headache +constipation-improved , double vision -unchanged  Objective:   DG Toe Great Right  Result Date: 08/15/2022 CLINICAL DATA:  Right great toe pain.  Bruise.  No known injury. EXAM: RIGHT GREAT TOE COMPARISON:  None Available. FINDINGS: There is diffuse decreased bone mineralization. No acute fracture is seen. Mild hallux valgus. No significant joint space narrowing. No dislocation. IMPRESSION: 1. No acute fracture. 2. Mild hallux valgus. Electronically Signed   By: Neita Garnet M.D.   On: 08/15/2022 12:05   No results for input(s): "WBC", "HGB", "HCT", "PLT" in the last 72 hours.   No results for input(s): "NA", "K", "CL", "CO2", "GLUCOSE", "BUN", "CREATININE", "CALCIUM" in the last 72 hours.    Intake/Output Summary (Last 24 hours) at 08/16/2022 0753 Last data filed at 08/15/2022 1819 Gross per 24 hour  Intake 474 ml  Output --  Net 474 ml        Physical Exam: Vital Signs Blood pressure 113/60, pulse 93, temperature 98.7 F (37.1 C), temperature source Oral, resp. rate 17, height 5\' 2"  (1.575 m), weight 68 kg, SpO2 97%.   General: No acute distress.  She is a little anxious about endoscopy results Heart: Regular rate and rhythm no rubs murmurs or extra sounds Lungs: Clear to auscultation, breathing unlabored, no rales or wheezes Abdomen: Positive bowel sounds, soft nontender to palpation, nondistended Extremities: No clubbing, cyanosis, or edema Skin: warm and dry Neurologic: Alert and awake, cranial nerves II through XII grossly intact, delayed responses-improved 3-/5 RIght delt bi tri grip HF 4- KE 3- ADF- unchanged  EOMI has double vision RIght lateral gaze mild strabismus which corrects with left lateral gaze,  no nystagmus or vertical diplopia  Neuro:  Eyes without evidence of nystagmus  Tone is normal without evidence of spasticity   MSK- ecchymosis base of Right hallux with mild circumferential swelling, no pain with ROM, no other joint deformity, unable to sense proprioception or LT       Assessment/Plan: 1. Functional deficits which require 3+ hours per day of interdisciplinary therapy in a comprehensive inpatient rehab setting. Physiatrist is providing close team supervision and 24 hour management of active medical problems listed below. Physiatrist and rehab team continue to assess barriers to discharge/monitor patient progress toward functional and medical goals  Care Tool:  Bathing    Body parts bathed by patient: Chest, Abdomen, Front perineal area, Right upper leg, Left upper leg, Face, Right arm   Body parts bathed by helper: Buttocks, Left arm, Left lower leg, Right lower leg     Bathing assist Assist Level: Moderate Assistance - Patient 50 - 74%     Upper Body Dressing/Undressing Upper body dressing   What is the patient wearing?: Pull over shirt    Upper body assist Assist Level: Minimal Assistance - Patient > 75%    Lower Body Dressing/Undressing Lower body dressing      What is the patient wearing?: Pants, Underwear/pull up  Lower body assist Assist for lower body dressing: Moderate Assistance - Patient 50 - 74%     Toileting Toileting Toileting Activity did not occur Press photographer and hygiene only): Refused  Toileting assist Assist for toileting: Maximal Assistance - Patient 25 - 49%     Transfers Chair/bed transfer  Transfers assist  Chair/bed transfer activity did not occur: Safety/medical concerns  Chair/bed transfer assist level: Moderate Assistance - Patient 50 - 74% Chair/bed transfer assistive device: Armrests, Bedrails   Locomotion Ambulation   Ambulation assist   Ambulation activity did not occur: Safety/medical  concerns          Walk 10 feet activity   Assist  Walk 10 feet activity did not occur: Safety/medical concerns        Walk 50 feet activity   Assist Walk 50 feet with 2 turns activity did not occur: Safety/medical concerns         Walk 150 feet activity   Assist Walk 150 feet activity did not occur: Safety/medical concerns         Walk 10 feet on uneven surface  activity   Assist Walk 10 feet on uneven surfaces activity did not occur: Safety/medical concerns         Wheelchair     Assist Is the patient using a wheelchair?: Yes Type of Wheelchair: Manual    Wheelchair assist level: Supervision/Verbal cueing, Minimal Assistance - Patient > 75% Max wheelchair distance: 23ft    Wheelchair 50 feet with 2 turns activity    Assist        Assist Level: Minimal Assistance - Patient > 75%   Wheelchair 150 feet activity     Assist      Assist Level: Dependent - Patient 0%   Blood pressure 113/60, pulse 93, temperature 98.7 F (37.1 C), temperature source Oral, resp. rate 17, height 5\' 2"  (1.575 m), weight 68 kg, SpO2 97%.  Medical Problem List and Plan: 1. 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 Critical stenosis versus occlusion of the distal right ACA. Severe right P2 PCA new since 2021  - 2.  Antithrombotics: -DVT/anticoagulation:  Pharmaceutical: Lovenox -Discussed Lovenox for DVT prevention             -antiplatelet therapy: Aspirin 81 mg daily and Plavix 75 mg daily , clopidigrel switched to ticagrelor based on antiplatelet resistance  3. Pain Management: Tylenol as needed   - 7/20: schedule tylenol 1000 mg TID. Has PRN robaxin.    - 7/21-8: no c/o pain; monitor  4. Mood/Behavior/Sleep: Provide emotional support             -antipsychotic agents: N/A 5. Neuropsych/cognition: This patient is capable of making decisions on her own behalf. 6. Skin/Wound Care: Routine skin checks 7. Fluids/Electrolytes/Nutrition: Routine in and outs with follow-up chemistries, asking to have  IV removed , had IV for EGD but not receiving meds or fluids at this time via IV  8.  Permissive hypertension.  Monitor with increased mobility    08/16/2022    4:28 AM 08/15/2022    7:15 PM 08/15/2022    2:15 PM  Vitals with BMI  Systolic 113 137 086  Diastolic 60 72 77  Pulse 93 97 101    7/28 BP well-controlled, heart rate elevated at times with mild tachy, monitor for orthostatic changes   9.  Hyperlipidemia.  Lipitor Lipid Panel     Component Value Date/Time   CHOL 184 07/28/2022 0523   TRIG 130 07/28/2022 0523   HDL 35 (L) 07/28/2022 0523   CHOLHDL 5.3 07/28/2022 0523   VLDL 26 07/28/2022 0523   LDLCALC 123 (H) 07/28/2022 0523    10.  History of alcohol tobacco use.  Counseling, nicoderm patch  11.  Overweight  BMI 28.47.  Dietary follow-up 12.  Medical noncompliance.  Counseling- doesn't like to take meds, esp HTN meds.   13.  Straining to have BM, will add colace    - LBM 7/23, pt reports still feeling constipated, will add senokot  -7/25 patient had BM yesterday however still feels constipated, will check abdominal x-ray `-7/26, last BM this morning.  X-ray yesterday with moderate stool burden  7/27 last BM this morning, continue to monitor  14.  Hypokalemia s/p  supplementation recheck K + in am  - 3.2 7/19   - Add kdur 20 mg daily for 2 days, recheck Monday  -7/23 K+ stable at 4.2  -7/28 potassium stable at 4.1 15. Anemia  -    Latest Ref Rng & Units 08/13/2022    7:41 AM 08/12/2022    5:30 AM 08/11/2022    5:55 AM  CBC  WBC 4.0 - 10.5 K/uL 9.8  8.9  8.2   Hemoglobin 12.0 - 15.0 g/dL 8.6  8.6  9.7   Hematocrit 36.0 - 46.0 % 27.4  27.7  30.4   Platelets 150 - 400 K/uL 383  359  356     7/28 Hemoccult positive, patient had endoscopy today, small erosions with stigmata of recent bleeding were found.  GI recommends resume regular diet continue PPI, follow hemoglobin and transfuse if necessary.  If anemia persists colonoscopy will be needed.  Appreciate assistance   16. Dizziness/double vision intermittent. Pt reports this has been going on since CVA  RIght occipital infarcts noted on MRI 7/23- which may give visual disturbance but not diplopia  17.  Renal azotemia  -BUN up to 22 creatinine stable at 0.63.  Encourage fluid intake 18.  RIght great toe ecchymosis WBAT in view of neg xray  LOS: 16 days A FACE TO FACE EVALUATION WAS PERFORMED  Erick Colace 08/16/2022, 7:53 AM

## 2022-08-16 NOTE — Plan of Care (Signed)
  Problem: Consults Goal: RH STROKE PATIENT EDUCATION Description: See Patient Education module for education specifics  Outcome: Progressing   Problem: RH SAFETY Goal: RH STG ADHERE TO SAFETY PRECAUTIONS W/ASSISTANCE/DEVICE Description: STG Adhere to Safety Precautions With cues Assistance/Device. Outcome: Progressing   Problem: RH KNOWLEDGE DEFICIT Goal: RH STG INCREASE KNOWLEDGE OF HYPERTENSION Description: Patient and family will be able to manage HTN with medications and dietary modifications using educational resources independently Outcome: Progressing Goal: RH STG INCREASE KNOWLEGDE OF HYPERLIPIDEMIA Description: Patient and family will be able to manage HLD with medications and dietary modifications using educational resources independently Outcome: Progressing Goal: RH STG INCREASE KNOWLEDGE OF STROKE PROPHYLAXIS Description: Patient and family will be able to manage secondary risks with medications and dietary modifications using educational resources independently Outcome: Progressing   Problem: RH Vision Goal: RH LTG Vision (Specify) Outcome: Progressing   Problem: Education: Goal: Knowledge of General Education information will improve Description: Including pain rating scale, medication(s)/side effects and non-pharmacologic comfort measures Outcome: Progressing   Problem: Health Behavior/Discharge Planning: Goal: Ability to manage health-related needs will improve Outcome: Progressing   Problem: Clinical Measurements: Goal: Ability to maintain clinical measurements within normal limits will improve Outcome: Progressing Goal: Will remain free from infection Outcome: Progressing Goal: Diagnostic test results will improve Outcome: Progressing Goal: Respiratory complications will improve Outcome: Progressing Goal: Cardiovascular complication will be avoided Outcome: Progressing   Problem: Activity: Goal: Risk for activity intolerance will decrease Outcome:  Progressing   Problem: Nutrition: Goal: Adequate nutrition will be maintained Outcome: Progressing   Problem: Coping: Goal: Level of anxiety will decrease Outcome: Progressing   Problem: Elimination: Goal: Will not experience complications related to bowel motility Outcome: Progressing Goal: Will not experience complications related to urinary retention Outcome: Progressing   Problem: Pain Managment: Goal: General experience of comfort will improve Outcome: Progressing   Problem: Safety: Goal: Ability to remain free from injury will improve Outcome: Progressing   Problem: Skin Integrity: Goal: Risk for impaired skin integrity will decrease Outcome: Progressing

## 2022-08-16 NOTE — Progress Notes (Signed)
Patient ID: Melissa Colon, female   DOB: Nov 07, 1960, 62 y.o.   MRN: [id number]  Sw received call from brother. Hospital bed arranged to be delivered today. Sw provided the contact information for medical records. SW informed by family patient arranged for a colonoscopy that could potentially delay d/c. Sw will FU with physician to confirm and FU with family.

## 2022-08-17 ENCOUNTER — Other Ambulatory Visit (HOSPITAL_COMMUNITY): Payer: Self-pay

## 2022-08-17 NOTE — Progress Notes (Signed)
Patient ID: Melissa Colon, female   DOB: 1960/09/18, 62 y.o.   MRN: [id number]  Patient approved for [facility] with [facility]. Orders sent.

## 2022-08-17 NOTE — Progress Notes (Signed)
Patient ID: Melissa Colon, female   DOB: 1960/01/24, 61 y.o.   MRN: [id number]   Patient [facility] transportation arranged between 10-11 AM. Pt and staff informed. Patient left at the nursing station.

## 2022-08-17 NOTE — Progress Notes (Signed)
Melissa Colon patient's brother is aware about patient's discharge per [facility]. He said they will pick up her wheelchair tomorrow.

## 2022-08-17 NOTE — Progress Notes (Signed)
Inpatient Rehabilitation Discharge Medication Review by a Pharmacist  A complete drug regimen review was completed for this patient to identify any potential clinically significant medication issues.  High Risk Drug Classes Is patient taking? Indication by Medication  Antipsychotic No   Anticoagulant No   Antibiotic No   Opioid No   Antiplatelet Yes Aspirin, Brilinta: CVA prophylaxis  Hypoglycemics/insulin No   Vasoactive Medication No   Chemotherapy No   Other Yes Lipitor: hyperlipidemia Docusate, Miralax: constipation Meclizine: nausea/dizziness Protonix: esophagitis/GI bleed Folic acid, MVI: vitamins/supplements Robaxin: muscle spasms Voltaren gel: pain     Type of Medication Issue Identified Description of Issue Recommendation(s)  Drug Interaction(s) (clinically significant)     Duplicate Therapy     Allergy     No Medication Administration End Date     Incorrect Dose     Additional Drug Therapy Needed     Significant med changes from prior encounter (inform family/care partners about these prior to discharge). Plavix changed to ticagrelor Restart or discontinue as appropriate. Communicate medication changes with patient/family at discharge  Other       Clinically significant medication issues were identified that warrant physician communication and completion of prescribed/recommended actions by midnight of the next day:  No   Pharmacist comments: n/a   Time spent performing this drug regimen review (minutes): 30   Thank you for allowing pharmacy to be a part of this patient's care.  Thelma Barge, PharmD Clinical Pharmacist

## 2022-08-17 NOTE — Progress Notes (Signed)
Patient ID: Melissa Colon, female   DOB: March 22, 1960, 62 y.o.   MRN: [id number]  Patient approved by [facility] for PT OT SLP

## 2022-08-17 NOTE — Progress Notes (Signed)
Inpatient Rehabilitation Care Coordinator Discharge Note   Patient Details  Name: Melissa Colon MRN: [id number] Date of Birth: Dec 25, 1960   Discharge location: Home ([facility] transport)  Length of Stay: 17 Days  Discharge activity level: Min/Mod  Home/community participation: Brother, niece, nephew, sons and spouse  Patient response BM:WUXLKG Literacy - How often do you need to have someone help you when you read instructions, pamphlets, or other written material from your doctor or pharmacy?: Sometimes  Patient response MW:NUUVOZ Isolation - How often do you feel lonely or isolated from those around you?: Never  Services provided included: MD, RD, PT, OT, SLP, RN, CM, Pharmacy, Neuropsych, SW, TR  Financial Services:  Field seismologist Utilized: Medicaid    Choices offered to/list presented to: Patient and family  Follow-up services arranged:  Home Health, DME Home Health Agency: [facility] PT OT SLP    DME : Hospital Bed, Drop Arm Commode and RW    Patient response to transportation need: Is the patient able to respond to transportation needs?: Yes In the past 12 months, has lack of transportation kept you from medical appointments or from getting medications?: No In the past 12 months, has lack of transportation kept you from meetings, work, or from getting things needed for daily living?: No   Patient/Family verbalized understanding of follow-up arrangements:  Yes  Individual responsible for coordination of the follow-up plan: Bettey Mare and [place]  [phone]  Confirmed correct DME delivered: Andria Rhein 08/17/2022    Comments (or additional information):  Summary of Stay    Date/Time Discharge Planning CSW  08/14/22 1447 Family has decided to move forward with d/c home 8/2. HS, WC and Drop Arm Commode ordered. Pt and family prefer pt sleep on the couch due to it being easier for them to manage care needs. Spouse has declined the ramp due to length  of ramp and cost of ramp. CJB  08/07/22 1508 Patient discharging home with assistance from her brother, son and spouse 24/7. CJB  07/31/22 1359 New admission, assesment pending CJB       Andria Rhein

## 2022-08-17 NOTE — Progress Notes (Addendum)
PROGRESS NOTE   Subjective/Complaints:  Unable to see coffee on left side of tray   ROS- neg for fevers, abdominal pain CP, SOB, N/V/D, new motor or sensory changes, Headache +constipation-improved , double vision -unchanged  Objective:   DG Toe Great Right  Result Date: 08/15/2022 CLINICAL DATA:  Right great toe pain.  Bruise.  No known injury. EXAM: RIGHT GREAT TOE COMPARISON:  None Available. FINDINGS: There is diffuse decreased bone mineralization. No acute fracture is seen. Mild hallux valgus. No significant joint space narrowing. No dislocation. IMPRESSION: 1. No acute fracture. 2. Mild hallux valgus. Electronically Signed   By: Neita Garnet M.D.   On: 08/15/2022 12:05   No results for input(s): "WBC", "HGB", "HCT", "PLT" in the last 72 hours.   No results for input(s): "NA", "K", "CL", "CO2", "GLUCOSE", "BUN", "CREATININE", "CALCIUM" in the last 72 hours.    Intake/Output Summary (Last 24 hours) at 08/17/2022 0741 Last data filed at 08/16/2022 1851 Gross per 24 hour  Intake 591 ml  Output 125 ml  Net 466 ml        Physical Exam: Vital Signs Blood pressure 117/69, pulse 94, temperature 98.3 F (36.8 C), temperature source Oral, resp. rate 18, height 5\' 2"  (1.575 m), weight 68 kg, SpO2 97%.   General: No acute distress.  She is a little anxious about endoscopy results Heart: Regular rate and rhythm no rubs murmurs or extra sounds Lungs: Clear to auscultation, breathing unlabored, no rales or wheezes Abdomen: Positive bowel sounds, soft nontender to palpation, nondistended Extremities: No clubbing, cyanosis, or edema Skin: warm and dry Neurologic: Alert and awake, cranial nerves II through XII grossly intact, delayed responses-improved 3-/5 RIght delt bi tri grip HF 4- KE 3- ADF- unchanged  EOMI has double vision RIght lateral gaze mild strabismus which corrects with left lateral gaze, no nystagmus or vertical  diplopia  Neuro:  Eyes without evidence of nystagmus  Tone is normal without evidence of spasticity   MSK- ecchymosis base of Right hallux with mild circumferential swelling, no pain with ROM, no other joint deformity, unable to sense proprioception or LT       Assessment/Plan: 1. Functional deficits due to Right occipital infarct \ Stable for D/C today F/u PCP in 3-4 weeks F/u Neuro 1-2 mo F/u PM&R 2 weeks See D/C summary See D/C instructions   Care Tool:  Bathing    Body parts bathed by patient: Chest, Abdomen, Front perineal area, Right upper leg, Left upper leg, Face, Right arm, Left lower leg, Right lower leg   Body parts bathed by helper: Buttocks, Left arm, Left lower leg, Right lower leg     Bathing assist Assist Level: Minimal Assistance - Patient > 75%     Upper Body Dressing/Undressing Upper body dressing   What is the patient wearing?: Pull over shirt    Upper body assist Assist Level: Minimal Assistance - Patient > 75%    Lower Body Dressing/Undressing Lower body dressing      What is the patient wearing?: Pants, Underwear/pull up     Lower body assist Assist for lower body dressing: Minimal Assistance - Patient > 75%     Toileting  Toileting Toileting Activity did not occur Press photographer and hygiene only): Refused  Toileting assist Assist for toileting: Moderate Assistance - Patient 50 - 74%     Transfers Chair/bed transfer  Transfers assist  Chair/bed transfer activity did not occur: Safety/medical concerns  Chair/bed transfer assist level: Moderate Assistance - Patient 50 - 74% Chair/bed transfer assistive device: Bedrails, Armrests   Locomotion Ambulation   Ambulation assist   Ambulation activity did not occur: Safety/medical concerns          Walk 10 feet activity   Assist  Walk 10 feet activity did not occur: Safety/medical concerns        Walk 50 feet activity   Assist Walk 50 feet with 2 turns activity  did not occur: Safety/medical concerns         Walk 150 feet activity   Assist Walk 150 feet activity did not occur: Safety/medical concerns         Walk 10 feet on uneven surface  activity   Assist Walk 10 feet on uneven surfaces activity did not occur: Safety/medical concerns         Wheelchair     Assist Is the patient using a wheelchair?: Yes Type of Wheelchair: Manual    Wheelchair assist level: Minimal Assistance - Patient > 75% Max wheelchair distance: 152ft    Wheelchair 50 feet with 2 turns activity    Assist        Assist Level: Supervision/Verbal cueing   Wheelchair 150 feet activity     Assist      Assist Level: Minimal Assistance - Patient > 75%   Blood pressure 117/69, pulse 94, temperature 98.3 F (36.8 C), temperature source Oral, resp. rate 18, height 5\' 2"  (1.575 m), weight 68 kg, SpO2 97%.  Medical Problem List and Plan: 1. 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  Antithrombotics: -DVT/anticoagulation:  Pharmaceutical: Lovenox -Discussed Lovenox for DVT prevention             -antiplatelet therapy: Aspirin 81 mg daily and Plavix 75 mg daily , clopidigrel switched to ticagrelor based on antiplatelet resistance  3. Pain Management: Tylenol as needed   - 7/20: schedule tylenol 1000 mg TID. Has PRN robaxin.    - 7/21-8: no c/o pain; monitor  4. Mood/Behavior/Sleep: Provide emotional support             -antipsychotic agents: N/A 5. Neuropsych/cognition: This patient is capable of making decisions on her own behalf. 6. Skin/Wound Care: Routine skin checks 7. Fluids/Electrolytes/Nutrition: Routine in and outs with follow-up chemistries, asking  to have IV removed , had IV for EGD but not receiving meds or fluids at this time via IV  8.  Permissive hypertension.  Monitor with increased mobility    08/17/2022    4:51 AM 08/16/2022    7:41 PM 08/16/2022    3:47 PM  Vitals with BMI  Systolic 117 152 213  Diastolic 69 56 85  Pulse 94 87 87    7/28 BP well-controlled, heart rate elevated at times with mild tachy, monitor for orthostatic changes   9.  Hyperlipidemia.  Lipitor Lipid Panel     Component Value Date/Time   CHOL 184  07/28/2022 0523   TRIG 130 07/28/2022 0523   HDL 35 (L) 07/28/2022 0523   CHOLHDL 5.3 07/28/2022 0523   VLDL 26 07/28/2022 0523   LDLCALC 123 (H) 07/28/2022 0523    10.  History of alcohol tobacco use.  Counseling, nicoderm patch  11.  Overweight  BMI 28.47.  Dietary follow-up 12.  Medical noncompliance.  Counseling- doesn't like to take meds, esp HTN meds.   13.  Straining to have BM, will add colace   LBM 7/31 cont colace   14.  Hypokalemia s/p supplementation recheck K + in am  - 3.2 7/19   - Add kdur 20 mg daily for 2 days, recheck Monday  -7/23 K+ stable at 4.2  -7/28 potassium stable at 4.1 15. Anemia  -    Latest Ref Rng & Units 08/13/2022    7:41 AM 08/12/2022    5:30 AM 08/11/2022    5:55 AM  CBC  WBC 4.0 - 10.5 K/uL 9.8  8.9  8.2   Hemoglobin 12.0 - 15.0 g/dL 8.6  8.6  9.7   Hematocrit 36.0 - 46.0 % 27.4  27.7  30.4   Platelets 150 - 400 K/uL 383  359  356     7/28 Hemoccult positive, patient had endoscopy today, small erosions with stigmata of recent bleeding were found.  GI recommends resume regular diet continue PPI, follow hemoglobin and transfuse if necessary.  If anemia persists colonoscopy will be needed.  Appreciate assistance   16. Dizziness/double vision intermittent. Pt reports this has been going on since CVA  RIght occipital infarcts noted on MRI 7/23-  Has Left homonymous hemianopsia as expected  17.  Renal azotemia  -resolved  18.  RIght great toe ecchymosis WBAT in  view of neg xray  LOS: 17 days A FACE TO FACE EVALUATION WAS PERFORMED  Erick Colace 08/17/2022, 7:41 AM

## 2022-08-17 NOTE — Progress Notes (Signed)
Patient ID: Melissa Colon, female   DOB: [birth year], 62 y.o.   MRN: [id number]  SW spoke with [name]. Sw will arrange transportation between 10-11 AM based on availability.

## 2022-08-17 NOTE — Progress Notes (Signed)
Patient ID: Melissa Colon, female   DOB: 1960-04-15, 62 y.o.   MRN: [id number]  Patient now declined by [facility] Agency reports insurance not in network.   [facility] referral sent to [facility].   Patient approved.

## 2022-08-28 ENCOUNTER — Telehealth: Payer: Self-pay | Admitting: Physical Medicine & Rehabilitation

## 2022-08-28 NOTE — Telephone Encounter (Signed)
Mileen from [facility] called to inform us that patient declined speech eval

## 2022-08-29 ENCOUNTER — Emergency Department
Admission: EM | Admit: 2022-08-29 | Discharge: 2022-09-04 | Disposition: A | Discharge status: Home / Self Care | Payer: Medicaid Other | Attending: Emergency Medicine | Admitting: Emergency Medicine

## 2022-08-29 ENCOUNTER — Other Ambulatory Visit: Payer: Self-pay

## 2022-08-29 ENCOUNTER — Encounter: Payer: Self-pay | Admitting: *Deleted

## 2022-08-29 ENCOUNTER — Emergency Department: Payer: Medicaid Other

## 2022-08-29 DIAGNOSIS — R109 Unspecified abdominal pain: Secondary | ICD-10-CM | POA: Diagnosis present

## 2022-08-29 DIAGNOSIS — D649 Anemia, unspecified: Secondary | ICD-10-CM | POA: Diagnosis not present

## 2022-08-29 DIAGNOSIS — N131 Hydronephrosis with ureteral stricture, not elsewhere classified: Secondary | ICD-10-CM | POA: Insufficient documentation

## 2022-08-29 DIAGNOSIS — N200 Calculus of kidney: Secondary | ICD-10-CM

## 2022-08-29 LAB — URINALYSIS, ROUTINE W REFLEX MICROSCOPIC: RBC / HPF: >50 RBC/hpf (ref 0–5)

## 2022-08-29 LAB — CBC
HCT: 25.9 % — ABNORMAL LOW (ref 36.0–46.0)
Hemoglobin: 7.9 g/dL — ABNORMAL LOW (ref 12.0–15.0)
MCH: 26.2 pg (ref 26.0–34.0)
MCHC: 30.5 g/dL (ref 30.0–36.0)
MCV: 86 fL (ref 80.0–100.0)
Platelets: 449 10*3/uL — ABNORMAL HIGH (ref 150–400)
RBC: 3.01 MIL/uL — ABNORMAL LOW (ref 3.87–5.11)
RDW: 14.6 % (ref 11.5–15.5)
WBC: 11.7 10*3/uL — ABNORMAL HIGH (ref 4.0–10.5)
nRBC: 0 % (ref 0.0–0.2)

## 2022-08-29 LAB — COMPREHENSIVE METABOLIC PANEL
ALT: 26 U/L (ref 0–44)
AST: 23 U/L (ref 15–41)
Albumin: 3.7 g/dL (ref 3.5–5.0)
Alkaline Phosphatase: 133 U/L — ABNORMAL HIGH (ref 38–126)
Anion gap: 9 (ref 5–15)
BUN: 13 mg/dL (ref 6–20)
CO2: 24 mmol/L (ref 22–32)
Calcium: 8.6 mg/dL — ABNORMAL LOW (ref 8.9–10.3)
Chloride: 108 mmol/L (ref 98–111)
Creatinine, Ser: 0.81 mg/dL (ref 0.44–1.00)
GFR, Estimated: >60 mL/min (ref >60)
Glucose, Bld: 120 mg/dL — ABNORMAL HIGH (ref 70–99)
Potassium: 3.2 mmol/L — ABNORMAL LOW (ref 3.5–5.1)
Sodium: 141 mmol/L (ref 135–145)
Total Bilirubin: 0.4 mg/dL (ref 0.3–1.2)
Total Protein: 7.4 g/dL (ref 6.5–8.1)

## 2022-08-29 LAB — LIPASE, BLOOD: Lipase: 38 U/L (ref 11–51)

## 2022-08-29 MED ORDER — SODIUM CHLORIDE 0.9 % IV BOLUS
1000.0000 mL | Freq: Once | INTRAVENOUS | Status: AC
  Administered 2022-08-29: 1000 mL via INTRAVENOUS

## 2022-08-29 MED ORDER — IOHEXOL 300 MG/ML  SOLN
100.0000 mL | Freq: Once | INTRAMUSCULAR | Status: AC | PRN
  Administered 2022-08-29: 100 mL via INTRAVENOUS

## 2022-08-29 MED ORDER — POTASSIUM CHLORIDE CRYS ER 20 MEQ PO TBCR
40.0000 meq | EXTENDED_RELEASE_TABLET | Freq: Once | ORAL | Status: AC
  Administered 2022-08-29: 40 meq via ORAL
  Filled 2022-08-29: qty 2

## 2022-08-29 MED ORDER — ONDANSETRON HCL 4 MG/2ML IJ SOLN
4.0000 mg | Freq: Once | INTRAMUSCULAR | Status: AC
  Administered 2022-08-29: 4 mg via INTRAVENOUS
  Filled 2022-08-29: qty 2

## 2022-08-29 MED ORDER — FENTANYL CITRATE PF 50 MCG/ML IJ SOSY
50.0000 ug | PREFILLED_SYRINGE | Freq: Once | INTRAMUSCULAR | Status: AC
  Administered 2022-08-29: 50 ug via INTRAVENOUS
  Filled 2022-08-29: qty 1

## 2022-08-29 NOTE — ED Triage Notes (Signed)
First RN: pt from home with reports of left lower abd pain and constipation x 4 days with no relief from laxatives. Pt is w/c bound with hx of cva.  130/70 98RA 75HR 143 CBG

## 2022-08-29 NOTE — ED Provider Notes (Signed)
[facility] Provider Note    Event Date/Time   First MD Initiated Contact with Patient 08/29/22 1555     (approximate)   History   Abdominal Pain and Constipation   HPI  Melissa Colon is a 62 y.o. female past medical history significant for prior posterior fossa brain tumor with resection at age 25, recent infarct of right medial, inferior and right occipital lobe, presents to the emergency department with abdominal pain.  Patient states that she had abdominal pain that started earlier today.  Is here with her husband who is at bedside.  States her last bowel movement was earlier today.  Complaining of pain of 4/10.  Endorses nausea but no episodes of vomiting.  Denies any diarrhea.  Does endorse some constipation.  Denies any dysuria, urinary urgency or frequency.  Family member over the phone stated that she was having problems with GI bleed while she was in the hospital.  They are scheduled for colonoscopy however she left prior to getting a colonoscopy.  She is refused multiple things at home including speech path and has been refusing a lot of medications.     Physical Exam   Triage Vital Signs: ED Triage Vitals  Encounter Vitals Group     BP 08/29/22 1452 (!) 142/63     Systolic BP Percentile --      Diastolic BP Percentile --      Pulse Rate 08/29/22 1452 65     Resp 08/29/22 1452 16     Temp 08/29/22 1452 97.7 F (36.5 C)     Temp Source 08/29/22 1452 Oral     SpO2 08/29/22 1452 100 %     Weight --      Height --      Head Circumference --      Peak Flow --      Pain Score 08/29/22 1446 10     Pain Loc --      Pain Education --      Exclude from Growth Chart --     Most recent vital signs: Vitals:   08/29/22 1830 08/29/22 1900  BP: (!) 140/77 (!) 128/52  Pulse: 94 95  Resp: (!) 33 18  Temp:    SpO2: 100% 100%    Physical Exam Constitutional:      Appearance: She is well-developed.  HENT:     Head: Atraumatic.  Eyes:      Conjunctiva/sclera: Conjunctivae normal.  Cardiovascular:     Rate and Rhythm: Regular rhythm.  Pulmonary:     Effort: No respiratory distress.  Abdominal:     General: There is no distension.     Tenderness: There is abdominal tenderness in the left lower quadrant.  Genitourinary:    Comments: Rectal exam with no gross blood or melena Musculoskeletal:        General: Normal range of motion.     Cervical back: Normal range of motion.  Skin:    General: Skin is warm.  Neurological:     General: No focal deficit present.     Mental Status: She is alert. Mental status is at baseline.     Comments: Neuroexam at patient's baseline line.       IMPRESSION / MDM / ASSESSMENT AND PLAN / ED COURSE  I reviewed the triage vital signs and the nursing notes.  Differential diagnosis including constipation, diverticulitis, diverticulosis, GI bleed, kidney stone, pyelonephritis   EKG  I, Corena Herter, the attending physician, personally viewed  and interpreted this ECG.   Rate: Normal  Rhythm: Normal sinus  Axis: Normal  Intervals: Normal  ST&T Change: None  No tachycardic or bradycardic dysrhythmias while on cardiac telemetry.  RADIOLOGY my interpretation of imaging: CT abdomen and pelvis with contrast -with no obvious acute findings.  Read as punctate kidney stone on the left side with mild hydronephrosis.  LABS (all labs ordered are listed, but only abnormal results are displayed) Labs interpreted as -    Labs Reviewed  COMPREHENSIVE METABOLIC PANEL - Abnormal; Notable for the following components:      Result Value   Potassium 3.2 (*)    Glucose, Bld 120 (*)    Calcium 8.6 (*)    Alkaline Phosphatase 133 (*)    All other components within normal limits  CBC - Abnormal; Notable for the following components:   WBC 11.7 (*)    RBC 3.01 (*)    Hemoglobin 7.9 (*)    HCT 25.9 (*)    Platelets 449 (*)    All other components within normal limits  URINALYSIS, ROUTINE W  REFLEX MICROSCOPIC - Abnormal; Notable for the following components:   Color, Urine RED (*)    APPearance CLOUDY (*)    Glucose, UA   (*)    Value: TEST NOT REPORTED DUE TO COLOR INTERFERENCE OF URINE PIGMENT   Hgb urine dipstick   (*)    Value: TEST NOT REPORTED DUE TO COLOR INTERFERENCE OF URINE PIGMENT   Bilirubin Urine   (*)    Value: TEST NOT REPORTED DUE TO COLOR INTERFERENCE OF URINE PIGMENT   Ketones, ur   (*)    Value: TEST NOT REPORTED DUE TO COLOR INTERFERENCE OF URINE PIGMENT   Protein, ur   (*)    Value: TEST NOT REPORTED DUE TO COLOR INTERFERENCE OF URINE PIGMENT   Nitrite   (*)    Value: TEST NOT REPORTED DUE TO COLOR INTERFERENCE OF URINE PIGMENT   Leukocytes,Ua   (*)    Value: TEST NOT REPORTED DUE TO COLOR INTERFERENCE OF URINE PIGMENT   Bacteria, UA FEW (*)    All other components within normal limits  URINE CULTURE  LIPASE, BLOOD  POC URINE PREG, ED     MDM    Patient presented to the emergency department with left-sided abdominal pain.  Patient had a recent CVA and was just discharged home.  Patient is bedbound and having significant difficulties with ADLs at home.  Is cared for by husband and son.  On reevaluation patient's pain improved following a dose of IV fentanyl.  Found to have a punctate kidney stone which is likely the cause of her left-sided pain.  Significant color interference with blood.  Patient denies any dysuria, no fever or signs of sepsis, have a low suspicion for infected kidney stone.  Urine culture was added on.  Patient does have slowly progressing anemia on lab work.  No obvious signs or symptoms of a GI bleed.  She likely does need a referral and conversation with GI to evaluate for possible GI bleed.  Feel that this could be done as an outpatient.  After further discussion at time of discharge patient's family member states that they are concerned that they cannot care for her at home.  Wanted her to be evaluated by social work for  possible placement to higher level of care.  Her son is her power of attorney.  Placed PT and OT evaluation.  Placed for pharmacy to review  home medications.   PROCEDURES:  Critical Care performed: No  Procedures  Patient's presentation is most consistent with acute presentation with potential threat to life or bodily function.   MEDICATIONS ORDERED IN ED: Medications  potassium chloride SA (KLOR-CON M) CR tablet 40 mEq (has no administration in time range)  sodium chloride 0.9 % bolus 1,000 mL (0 mLs Intravenous Stopped 08/29/22 1925)  ondansetron (ZOFRAN) injection 4 mg (4 mg Intravenous Given 08/29/22 1723)  fentaNYL (SUBLIMAZE) injection 50 mcg (50 mcg Intravenous Given 08/29/22 1724)  iohexol (OMNIPAQUE) 300 MG/ML solution 100 mL (100 mLs Intravenous Contrast Given 08/29/22 1748)    FINAL CLINICAL IMPRESSION(S) / ED DIAGNOSES   Final diagnoses:  Kidney stone  Anemia, unspecified type     Rx / DC Orders   ED Discharge Orders     None        Note:  This document was prepared using Dragon voice recognition software and may include unintentional dictation errors.   Corena Herter, MD 08/29/22 2029

## 2022-08-29 NOTE — ED Notes (Incomplete)
When pt was being discharged the son, [name] (number in chart) was contacted about trying to get the pt home.  He stated that he was hoping she would be admitted because the family cannot care for the pt at home.

## 2022-08-29 NOTE — ED Notes (Signed)
Pt sleeping, resps unlabored.  

## 2022-08-30 ENCOUNTER — Encounter: Payer: Medicaid Other | Admitting: Registered Nurse

## 2022-08-30 MED ORDER — PANTOPRAZOLE SODIUM 40 MG PO TBEC
40.0000 mg | DELAYED_RELEASE_TABLET | Freq: Every day | ORAL | Status: DC
  Administered 2022-08-30 – 2022-09-04 (×6): 40 mg via ORAL
  Filled 2022-08-30 (×6): qty 1

## 2022-08-30 MED ORDER — ATORVASTATIN CALCIUM 20 MG PO TABS
80.0000 mg | ORAL_TABLET | Freq: Every day | ORAL | Status: DC
  Administered 2022-08-30 – 2022-09-04 (×6): 80 mg via ORAL
  Filled 2022-08-30 (×6): qty 4

## 2022-08-30 MED ORDER — MECLIZINE HCL 25 MG PO TABS: 25.0000 mg | ORAL_TABLET | Freq: Three times a day (TID) | ORAL | Status: DC | PRN

## 2022-08-30 MED ORDER — DICLOFENAC SODIUM 1 % EX GEL
2.0000 g | Freq: Four times a day (QID) | CUTANEOUS | Status: DC
  Administered 2022-08-30 – 2022-09-04 (×13): 2 g via TOPICAL
  Filled 2022-08-30: qty 100

## 2022-08-30 MED ORDER — METHOCARBAMOL 500 MG PO TABS: 750.0000 mg | ORAL_TABLET | Freq: Three times a day (TID) | ORAL | Status: DC | PRN

## 2022-08-30 MED ORDER — ADULT MULTIVITAMIN W/MINERALS CH
1.0000 | ORAL_TABLET | Freq: Every day | ORAL | Status: DC
  Administered 2022-08-30 – 2022-09-04 (×6): 1 via ORAL
  Filled 2022-08-30 (×6): qty 1

## 2022-08-30 MED ORDER — ASPIRIN 81 MG PO TBEC
81.0000 mg | DELAYED_RELEASE_TABLET | Freq: Every day | ORAL | Status: DC
  Administered 2022-08-30 – 2022-09-04 (×6): 81 mg via ORAL
  Filled 2022-08-30 (×6): qty 1

## 2022-08-30 MED ORDER — POLYETHYLENE GLYCOL 3350 17 G PO PACK
17.0000 g | PACK | Freq: Every day | ORAL | Status: DC
  Administered 2022-08-30 – 2022-09-04 (×3): 17 g via ORAL
  Filled 2022-08-30 (×4): qty 1

## 2022-08-30 MED ORDER — DOCUSATE SODIUM 100 MG PO CAPS
100.0000 mg | ORAL_CAPSULE | Freq: Every day | ORAL | Status: DC
  Administered 2022-08-30 – 2022-09-04 (×4): 100 mg via ORAL
  Filled 2022-08-30 (×4): qty 1

## 2022-08-30 MED ORDER — TICAGRELOR 90 MG PO TABS
90.0000 mg | ORAL_TABLET | Freq: Two times a day (BID) | ORAL | Status: DC
  Administered 2022-08-30 – 2022-09-04 (×11): 90 mg via ORAL
  Filled 2022-08-30 (×11): qty 1

## 2022-08-30 MED ORDER — FOLIC ACID 1 MG PO TABS
1.0000 mg | ORAL_TABLET | Freq: Every day | ORAL | Status: DC
  Administered 2022-08-30 – 2022-09-04 (×6): 1 mg via ORAL
  Filled 2022-08-30 (×6): qty 1

## 2022-08-30 NOTE — ED Notes (Signed)
Assisted patient onto the bed pan. After patient finished passing urine, assisted patient off the bed pan and with assistance of nursing tech, repositioned patient higher in the bed with feet slightly angled up to prevent slipping down.

## 2022-08-30 NOTE — ED Notes (Signed)
Patient assisted onto the bedpan and allowed to sit for a few in order to attempt to have a bowel movement. No bowel passed. A small amount of urine was passed.

## 2022-08-30 NOTE — TOC CM/SW Note (Signed)
Cm received TOC consult for SNF placement. Cm awaiting PT/OT consult

## 2022-08-30 NOTE — ED Notes (Signed)
Pt resting well, no complaints NAD

## 2022-08-30 NOTE — ED Provider Notes (Signed)
-----------------------------------------   8:49 PM on 08/30/2022 -----------------------------------------   Blood pressure (!) 121/59, pulse 78, temperature 97.7 F (36.5 C), temperature source Oral, resp. rate 18, SpO2 100%.  The patient is calm and cooperative at this time.  There have been no acute events since the last update. Evaluation by PT and OT today. No complaints at this time. Home meds reordered.  Awaiting disposition plan from case management/social work.    Corena Herter, MD 08/30/22 2049

## 2022-08-30 NOTE — ED Notes (Signed)
Pt resting comfortably, offers no complaints, NAD

## 2022-08-30 NOTE — ED Notes (Signed)
Pt rolled independently to be placed on bedpan, call bell in place, head of bed adjusted.

## 2022-08-30 NOTE — Evaluation (Signed)
Occupational Therapy Evaluation Patient Details Name: Melissa Colon MRN: [id number] DOB: [birth year] Today's Date: 08/30/2022   History of Present Illness 62 y.o. female past medical history significant for prior posterior fossa brain tumor with resection at age 55, recent infarct of right medial, inferior and right occipital lobe, presents to the emergency department with abdominal pain.  Patient states that she had abdominal pain that started earlier today.  Is here with her husband who is at bedside.  States her last bowel movement was earlier today.  Complaining of pain of 4/10.   Clinical Impression   Patient presenting with decreased Ind in self care,balance, functional mobility/transfers, endurance,and safety awareness. Patient reports living at home with husband, son, and other family members who are all involved with her care and assist her with all aspects of life. Pt transfers only with mod A into wheelchair or onto commode. Family assists with ADLs and IADLs. Pt performs bed mobility with min A to EOB and maintains static sitting balance with supervision. Pt has had diplopia for several years after CVA in 21'. Pt is likely close to baseline but OT will continue to see her in acute care so she can maintain her functional abilities. Patient will benefit from acute OT to increase overall independence in the areas of ADLs, functional mobility, and safety awareness in order to safely discharge.       If plan is discharge home, recommend the following: A lot of help with walking and/or transfers;A lot of help with bathing/dressing/bathroom;Assistance with cooking/housework;Assist for transportation;Help with stairs or ramp for entrance;Direct supervision/assist for medications management;Direct supervision/assist for financial management    Functional Status Assessment  Patient has had a recent decline in their functional status and demonstrates the ability to make significant improvements  in function in a reasonable and predictable amount of time.  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      Mobility Bed Mobility Overal bed mobility: Needs Assistance Bed Mobility: Supine to Sit, Sit to Supine     Supine to sit: Min assist Sit to supine: Min assist        Transfers                   General transfer comment: not attempted as pt is in high ED stretcher bed      Balance Overall balance assessment: Needs assistance Sitting-balance support: Bilateral upper extremity supported Sitting balance-Leahy Scale: Good                                     ADL either performed or assessed with clinical judgement   ADL Overall ADL's : At baseline;Needs assistance/impaired                                       General ADL Comments: Pt needs set up A for grooming tasks while seated on EOB. UB self care with min A and LB self care with mod A.     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  Pertinent Vitals/Pain Pain Assessment Pain Assessment: No/denies pain     Extremity/Trunk Assessment Upper Extremity Assessment Upper Extremity Assessment: Generalized weakness;Right hand dominant;RUE deficits/detail RUE Deficits / Details: 3/5 strength able to move against gravity but not functionally   Lower Extremity Assessment Lower Extremity Assessment: Generalized weakness       Communication Communication Communication: No apparent difficulties Cueing Techniques: Verbal cues   Cognition Arousal: Alert Behavior During Therapy: WFL for tasks assessed/performed Overall Cognitive Status: No family/caregiver present to  determine baseline cognitive functioning                                 General Comments: Pt slow to process but follows commands with increased time.                Home Living Family/patient expects to be discharged to:: Private residence Living Arrangements: Spouse/significant other;Children;Other relatives Available Help at Discharge: Family;Available 24 hours/day Type of Home: House Home Access: Stairs to enter [employer] of Steps: 7 Entrance Stairs-Rails: Right Home Layout: One level     Bathroom Shower/Tub: Producer, television/film/video: Standard Bathroom Accessibility: Yes How Accessible: Accessible via wheelchair Home Equipment: Cane - single point;Rolling Walker (2 wheels);BSC/3in1;Wheelchair - manual;Shower seat - built in;Hospital bed   Additional Comments: Pt reports living with husband, son, and niece.  Lives With: Spouse;Son;Other (Comment)    Prior Functioning/Environment Prior Level of Function : Needs assist             Mobility Comments: Per chart review, pt's family reports pt has been sedentary since '21 CVA and unable to ambulate with assistance for transfers. She left CIR ~ 2 weeks ago at mod A squat pivot transfers. No ambulation. ADLs Comments: Pt requires mod A for self care and functional mobility. Family assists with medication management and pt feeds herself.        OT Problem List: Decreased strength;Decreased activity tolerance;Decreased safety awareness;Impaired balance (sitting and/or standing);Decreased knowledge of use of DME or AE;Decreased range of motion;Decreased cognition;Decreased coordination;Impaired UE functional use      OT Treatment/Interventions: Self-care/ADL training;Therapeutic exercise;Therapeutic activities;Energy conservation;DME and/or AE instruction;Patient/family education;Balance training;Manual therapy;Cognitive remediation/compensation;Visual/perceptual remediation/compensation     OT Goals(Current goals can be found in the care plan section) Acute Rehab OT Goals Patient Stated Goal: to go home OT Goal Formulation: With patient Time For Goal Achievement: 09/13/22 Potential to Achieve Goals: Fair ADL Goals Pt Will Perform Grooming: sitting;with supervision;with set-up Pt Will Perform Lower Body Dressing: with min assist;sitting/lateral leans Pt Will Transfer to Toilet: with min assist;squat pivot transfer Pt Will Perform Toileting - Clothing Manipulation and hygiene: with mod assist;sitting/lateral leans  OT Frequency: Min 1X/week       AM-PAC OT "6 Clicks" Daily Activity     Outcome Measure Help from another person eating meals?: A Little Help from another person taking care of personal grooming?: A Little Help from another person toileting, which includes using toliet, bedpan, or urinal?: A Lot Help from another person bathing (including washing, rinsing, drying)?: A Lot Help from another person to put on and taking off regular upper body clothing?: A Little Help from another person to put on and taking off regular lower body clothing?: A Lot 6 Click Score: 15   End of Session Nurse Communication: Mobility status  Activity Tolerance: Patient tolerated treatment well Patient left: in bed;with call bell/phone within reach;with bed alarm set  OT Visit Diagnosis: Unsteadiness  on feet (R26.81);Repeated falls (R29.6);Muscle weakness (generalized) (M62.81)                Time: 1003-1020 OT Time Calculation (min): 17 min Charges:  OT General Charges $OT Visit: 1 Visit OT Evaluation $OT Eval Moderate Complexity: 1 Mod OT Treatments $Therapeutic Activity: 8-22 mins  Jackquline Denmark, MS, OTR/L , CBIS ascom [phone]  08/30/22, 1:25 PM

## 2022-08-30 NOTE — ED Notes (Signed)
Pt resting comfortably, NAD

## 2022-08-30 NOTE — Progress Notes (Signed)
Physical Therapy Evaluation Patient Details Name: Melissa Colon MRN: [id number] DOB: 02-Oct-1960 Today's Date: 08/30/2022   History of Present Illness  62 y.o. female past medical history significant for prior posterior fossa brain tumor with resection at age 26, recent infarct of right medial, inferior and right occipital lobe, presents to the emergency department with abdominal pain.   Clinical Impression  Patient agreeable to PT evaluation this date, denies pain. Patient reports at baseline was living at home with her husband/son that assisted her with transfers and ADLs. Pt reports she was not ambulating and was using a manual w/c for primary means of mobility. On evaluation, patient presents with decreased mobility, impaired balance, coordination deficits and weakness (history of R hemiparesis). Patient require Min A with bed mobility and Mod A with transfers. Patient unable to stand this date. Patient currently presents close to functional baseline, however while admitted patient will benefit from skilled acute PT services to maintain and maximize functional mobility and reduce caregiver burden. Will continue to follow acutely.       If plan is discharge home, recommend the following: A lot of help with walking and/or transfers;A lot of help with bathing/dressing/bathroom;Assist for transportation;Help with stairs or ramp for entrance   Can travel by private vehicle        Equipment Recommendations None recommended by PT  Recommendations for Other Services       Functional Status Assessment Patient has had a recent decline in their functional status and/or demonstrates limited ability to make significant improvements in function in a reasonable and predictable amount of time     Precautions / Restrictions Precautions Precautions: Fall Precaution Comments: Residual R Hemiparesis (prior CVAs) Required Braces or Orthoses: Other Brace Other Brace: per chart, patient was using  aircast on RLE due to inversion. Patient unsure if she has this brace at home. Restrictions Weight Bearing Restrictions: No      Mobility  Bed Mobility Overal bed mobility: Needs Assistance Bed Mobility: Supine to Sit, Sit to Supine     Supine to sit: Min assist Sit to supine: Min assist        Transfers Overall transfer level: Needs assistance Equipment used: None               General transfer comment: lateral scoot transfer to simulate transfers at home, patient require Mod A and multimodal cues. Coordination deficits noted with task    Ambulation/Gait               General Gait Details: patient was not a functional ambulator prior to admission; primary means of mobility was by wheelchair  Stairs            Wheelchair Mobility     Tilt Bed    Modified Rankin (Stroke Patients Only)       Balance Overall balance assessment: Needs assistance Sitting-balance support: Bilateral upper extremity supported Sitting balance-Leahy Scale: Good Sitting balance - Comments: Patient able to sit EOB without LOB                                     Pertinent Vitals/Pain Pain Assessment Pain Assessment: No/denies pain (Simultaneous filing. User may not have seen previous data.)    Home Living Family/patient expects to be discharged to:: Private residence Living Arrangements: Spouse/significant other;Children Available Help at Discharge: Family;Available 24 hours/day Type of Home: House Home Access: Stairs to enter Entrance Stairs-Rails: Right  Entrance Stairs-Number of Steps: 7   Home Layout: One level Home Equipment: Rolling Walker (2 wheels);BSC/3in1;Cane - single point;Shower seat - built in;Hospital bed Additional Comments: Patient reports she lives with husband and son; Someone is at home 24/7 to assist patient per patient reports    Prior Function Prior Level of Function : Needs assist             Mobility Comments: Per  chart review, pt's family reports pt has been sedentary since '21 CVA. She recently left CIR approx 2 weeks ago at mod A squat pivot transfers. No ambulation. Patient reports she has used wheelchair for mobility. Son/Husband assist with transfers ADLs Comments: Pt reports she sponges bathes at baseline with assistance from [facility]     Extremity/Trunk Assessment   Upper Extremity Assessment Upper Extremity Assessment: Generalized weakness;Right hand dominant;RUE deficits/detail RUE Deficits / Details: 3/5 strength able to move against gravity but not functionally    Lower Extremity Assessment Lower Extremity Assessment: Generalized weakness RLE Deficits / Details: generalized weakness in RLE (R Hemiparesis from CVA) RLE Sensation: decreased light touch RLE Coordination: decreased gross motor LLE Deficits / Details: WFL LLE Sensation: WNL       Communication   Communication Communication: No apparent difficulties Cueing Techniques: Verbal cues  Cognition Arousal: Alert Behavior During Therapy: Flat affect Overall Cognitive Status: History of cognitive impairments - at baseline                                          General Comments      Exercises     Assessment/Plan    PT Assessment Patient needs continued PT services  PT Problem List Decreased strength;Decreased activity tolerance;Decreased balance;Decreased mobility;Decreased coordination;Decreased knowledge of use of DME;Impaired sensation       PT Treatment Interventions DME instruction;Functional mobility training;Gait training;Therapeutic activities;Therapeutic exercise;Balance training;Neuromuscular re-education;Wheelchair mobility training    PT Goals (Current goals can be found in the Care Plan section)  Acute Rehab PT Goals Patient Stated Goal: get home PT Goal Formulation: With patient Time For Goal Achievement: 09/13/22 Potential to Achieve Goals: Fair    Frequency Min 1X/week      Co-evaluation               AM-PAC PT "6 Clicks" Mobility  Outcome Measure Help needed turning from your back to your side while in a flat bed without using bedrails?: A Little Help needed moving from lying on your back to sitting on the side of a flat bed without using bedrails?: A Little Help needed moving to and from a bed to a chair (including a wheelchair)?: A Lot Help needed standing up from a chair using your arms (e.g., wheelchair or bedside chair)?: A Lot Help needed to walk in hospital room?: Total Help needed climbing 3-5 steps with a railing? : Total 6 Click Score: 12    End of Session Equipment Utilized During Treatment: Gait belt Activity Tolerance: Patient tolerated treatment well Patient left: in bed;with call bell/phone within reach Nurse Communication: Mobility status PT Visit Diagnosis: Other abnormalities of gait and mobility (R26.89);Muscle weakness (generalized) (M62.81);Hemiplegia and hemiparesis Hemiplegia - Right/Left: Right Hemiplegia - dominant/non-dominant: Dominant Hemiplegia - caused by:  (Hx of CVAs)    Time: 1210-1225 PT Time Calculation (min) (ACUTE ONLY): 15 min   Charges:   PT Evaluation $PT Eval Low Complexity: 1 Low   PT General  Charges $$ ACUTE PT VISIT: 1 Visit         Creed Copper Fairly, PT, DPT 08/30/22 1:33 PM

## 2022-08-30 NOTE — ED Notes (Signed)
Patient was assisted onto the bedpan and off of bedpan after the patient was doing passing urine. Patient had a clean, dry adult diaper placed.

## 2022-08-30 NOTE — TOC CM/SW Note (Addendum)
Cm received a consult for SNF placement. Pt currently has PT/OT consult. Cm reached out to son  Melissa Colon [phone]. He stated that pt is not able to take care of herself when she goes home. Her other son has been taking care of her for the last 3 years. Cm will await PT/OT for SNF recommendations.  1640-Cm talked with pt and she is unsure if it is best for her to go home or to SNF. She wanted cm to talk with son for d/c planning. PT/OT recommended HH PT. Cm called son Melissa Colon to get more information on d/c planning.

## 2022-08-31 NOTE — ED Notes (Addendum)
This RN to bedside to take patient off bedpan. Patient did not have a BM at this time. Small amount of clear, yellow urine in bedpan. Patient cleaned up and given a new brief and paper pad. Patient states to this RN that she "feels weird" but can't pinpoint where it is coming from. MD notified. This RN took patient's vitals, which were stable. Patient advised to press call bell if her symptoms worsen or if she has a new onset of symptoms.

## 2022-08-31 NOTE — ED Provider Notes (Signed)
Emergency Medicine Observation Re-evaluation Note  Melissa Colon is a 62 y.o. female, currently boarding in the emergency department.  No acute events since last update.  Physical Exam  BP (!) 131/56 (BP Location: Left Arm)   Pulse 80   Temp (!) 97.5 F (36.4 C) (Oral)   Resp 16   SpO2 100%   ED Course / MDM  No lab work today or yesterday for review. Plan  Current plan is for placement to an appropriate living facility once available.  Social worker is currently working with the patient to achieve this.    Minna Antis, MD 08/31/22 1415

## 2022-08-31 NOTE — NC FL2 (Signed)
[place] MEDICAID FL2 LEVEL OF CARE FORM     IDENTIFICATION  Patient Name: Melissa Colon Birthdate: Feb 16, 1960 Sex: female Admission Date (Current Location): 08/29/2022  [facility] and [place] Number:  [address] [id number] Facility and Address:  [facility], [address], [place], [place] [address]      Provider Number: [id number]  Attending Physician Name and Address:  Sharyn Creamer, MD  Relative Name and Phone Number:  [name] [phone]    Current Level of Care: Hospital Recommended Level of Care: Skilled Nursing Facility Prior Approval Number:    Date Approved/Denied:   PASRR Number: [id number]  Discharge Plan: SNF    Current Diagnoses: Patient Active Problem List   Diagnosis Date Noted   Anemia 08/06/2022   Acute ischemic right MCA stroke (HCC) 07/31/2022   Stroke (cerebrum) (HCC) 07/28/2022   Right sided weakness 07/27/2022   Protein-calorie malnutrition, severe 12/16/2019   Hypertensive emergency 12/11/2019   Alcohol dependence (HCC) 12/11/2019   Tobacco use disorder 12/11/2019   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 12/10/2019    Orientation RESPIRATION BLADDER Height & Weight     Self, Time, Situation, Place  Normal Incontinent Weight:   Height:     BEHAVIORAL SYMPTOMS/MOOD NEUROLOGICAL BOWEL NUTRITION STATUS      Incontinent Diet (see d/c summary)  AMBULATORY STATUS COMMUNICATION OF NEEDS Skin   Extensive Assist Verbally Normal                       Personal Care Assistance Level of Assistance    Bathing Assistance: Limited assistance Feeding assistance: Limited assistance Dressing Assistance: Limited assistance     Functional Limitations Info             SPECIAL CARE FACTORS FREQUENCY                       Contractures Contractures Info: Not present    Additional Factors Info    Code Status Info: Full             Current Medications (08/31/2022):  This is the  current hospital active medication list Current Facility-Administered Medications  Medication Dose Route Frequency Provider Last Rate Last Admin   aspirin EC tablet 81 mg  81 mg Oral Daily Sharman Cheek, MD   81 mg at 08/31/22 1027   atorvastatin (LIPITOR) tablet 80 mg  80 mg Oral Daily Sharman Cheek, MD   80 mg at 08/31/22 1024   diclofenac Sodium (VOLTAREN) 1 % topical gel 2 g  2 g Topical QID Sharman Cheek, MD   2 g at 08/30/22 2157   docusate sodium (COLACE) capsule 100 mg  100 mg Oral Daily Sharman Cheek, MD   100 mg at 08/31/22 1026   folic acid (FOLVITE) tablet 1 mg  1 mg Oral Daily Sharman Cheek, MD   1 mg at 08/31/22 1026   meclizine (ANTIVERT) tablet 25 mg  25 mg Oral TID PRN Sharman Cheek, MD       methocarbamol (ROBAXIN) tablet 750 mg  750 mg Oral Q8H PRN Sharman Cheek, MD       multivitamin with minerals tablet 1 tablet  1 tablet Oral Daily Sharman Cheek, MD   1 tablet at 08/31/22 1021   pantoprazole (PROTONIX) EC tablet 40 mg  40 mg Oral Daily Sharman Cheek, MD   40 mg at 08/31/22 1026   polyethylene glycol (MIRALAX / GLYCOLAX) packet 17 g  17 g Oral  Daily Sharman Cheek, MD   17 g at 08/31/22 1021   ticagrelor (BRILINTA) tablet 90 mg  90 mg Oral BID Sharman Cheek, MD   90 mg at 08/31/22 1026   Current Outpatient Medications  Medication Sig Dispense Refill   aspirin EC 81 MG tablet Take 1 tablet (81 mg total) by mouth daily. Swallow whole. 30 tablet 12   atorvastatin (LIPITOR) 80 MG tablet Take 1 tablet (80 mg total) by mouth daily. 30 tablet 0   diclofenac Sodium (VOLTAREN) 1 % GEL Apply 2 g topically 4 (four) times daily. 100 g 0   docusate sodium (COLACE) 100 MG capsule Take 1 capsule (100 mg total) by mouth daily.     folic acid (FOLVITE) 1 MG tablet Take 1 tablet (1 mg total) by mouth daily. 30 tablet 0   meclizine (ANTIVERT) 25 MG tablet Take 1 tablet (25 mg total) by mouth 3 (three) times daily as needed for dizziness or nausea. 30  tablet 0   methocarbamol (ROBAXIN) 750 MG tablet Take 1 tablet (750 mg total) by mouth every 8 (eight) hours as needed for muscle spasms. 60 tablet 0   Multiple Vitamin (MULTIVITAMIN WITH MINERALS) TABS tablet Take 1 tablet by mouth daily.     pantoprazole (PROTONIX) 40 MG tablet Take 1 tablet (40 mg total) by mouth daily. 30 tablet 0   polyethylene glycol (MIRALAX / GLYCOLAX) 17 g packet Take 17 g by mouth daily.     ticagrelor (BRILINTA) 90 MG TABS tablet Take 1 tablet (90 mg total) by mouth 2 (two) times daily. 60 tablet 0     Discharge Medications: Please see discharge summary for a list of discharge medications.  Relevant Imaging Results:  Relevant Lab Results:   Additional Information SS: [id number]  Kreg Shropshire, RN

## 2022-08-31 NOTE — Progress Notes (Signed)
Occupational Therapy Treatment Patient Details Name: Melissa Colon MRN: [id number] DOB: [birth year] Today's Date: 08/31/2022   History of present illness 62 y.o. female past medical history significant for prior posterior fossa brain tumor with resection at age 17, recent infarct of right medial, inferior and right occipital lobe, presents to the emergency department with abdominal pain.   OT comments  Upon entering the room, pt supine in stretcher bed and agreeable to OT intervention. Pt performs bed mobility to EOB with min A. Pt becomes very tearful and reports family has not been in to see her. She is upset about her visual deficits which have been occurring for~ 3 years now. OT utilizing therapeutic use of self. Static sitting balance with close supervision. Pt unable to reach ground with feet from stretcher so RN arrived with regular hospital bed. Pt stands with mod A and transferred with mod stand pivot to hospital bed. OT discussed transfer with RN and referenced pt will be able to transfer to [facility] or recliner chair with staff assistance. Call bell and needs within reach.      If plan is discharge home, recommend the following:  A lot of help with walking and/or transfers;A lot of help with bathing/dressing/bathroom;Assistance with cooking/housework;Assist for transportation;Help with stairs or ramp for entrance;Direct supervision/assist for medications management;Direct supervision/assist for financial management   Equipment Recommendations  None recommended by OT       Precautions / Restrictions Precautions Precautions: Fall Precaution Comments: Residual R Hemiparesis (prior CVAs) Required Braces or Orthoses: Other Brace Other Brace: per chart, patient was using aircast on RLE due to inversion. Patient unsure if she has this brace at home.       Mobility Bed Mobility Overal bed mobility: Needs Assistance Bed Mobility: Supine to Sit, Sit to Supine     Supine to sit: Min  assist Sit to supine: Min assist        Transfers Overall transfer level: Needs assistance Equipment used: 1 person hand held assist Transfers: Sit to/from Stand, Bed to chair/wheelchair/BSC Sit to Stand: Mod assist   Squat pivot transfers: Mod assist             Balance Overall balance assessment: Needs assistance Sitting-balance support: Bilateral upper extremity supported Sitting balance-Leahy Scale: Good Sitting balance - Comments: Patient able to sit EOB without LOB   Standing balance support: Reliant on assistive device for balance, Bilateral upper extremity supported Standing balance-Leahy Scale: Fair                             ADL either performed or assessed with clinical judgement   ADL Overall ADL's : At baseline;Needs assistance/impaired                         Toilet Transfer: Moderate assistance;Stand-pivot Toilet Transfer Details (indicate cue type and reason): simulated                  Cognition Arousal: Alert Behavior During Therapy: WFL for tasks assessed/performed Overall Cognitive Status: No family/caregiver present to determine baseline cognitive functioning                                 General Comments: Pt slow to process but follows commands with increased time.  Pertinent Vitals/ Pain       Pain Assessment Pain Assessment: No/denies pain         Frequency  Min 1X/week        Progress Toward Goals  OT Goals(current goals can now be found in the care plan section)  Progress towards OT goals: Progressing toward goals      AM-PAC OT "6 Clicks" Daily Activity     Outcome Measure   Help from another person eating meals?: A Little Help from another person taking care of personal grooming?: A Little Help from another person toileting, which includes using toliet, bedpan, or urinal?: A Lot Help from another person bathing (including washing, rinsing, drying)?:  A Lot Help from another person to put on and taking off regular upper body clothing?: A Little Help from another person to put on and taking off regular lower body clothing?: A Lot 6 Click Score: 15    End of Session    OT Visit Diagnosis: Unsteadiness on feet (R26.81);Repeated falls (R29.6);Muscle weakness (generalized) (M62.81)   Activity Tolerance Patient tolerated treatment well   Patient Left in bed;with call bell/phone within reach;with bed alarm set   Nurse Communication Mobility status        Time: 9629-5284 OT Time Calculation (min): 25 min  Charges: OT General Charges $OT Visit: 1 Visit OT Treatments $Therapeutic Activity: 23-37 mins  Jackquline Denmark, MS, OTR/L , CBIS ascom [phone]  08/31/22, 4:16 PM

## 2022-08-31 NOTE — ED Notes (Signed)
Pt. Placed on bedpan for urine, pt. Has moderate urination. Taken off bedpan. Pt. Repositioned for comfort. Pt. Has tv remote and call light in hand/within reach. No further needs at this time.

## 2022-08-31 NOTE — ED Notes (Signed)
Pt continues to sleep, NAD, even respirations.

## 2022-08-31 NOTE — ED Notes (Signed)
Patient placed on bedpan by this RN. Patient states she wants to try to have a BM.

## 2022-08-31 NOTE — TOC Progression Note (Addendum)
Transition of Care [facility]) - Progression Note    Patient Details  Name: Saharrah Mizzi MRN: [id number] Date of Birth: [birth year]  [facility]) CM/SW Contact  Kreg Shropshire, RN Phone Number: 08/31/2022, 9:57 AM  Clinical Narrative:    Cm LVM for son [name] [phone] to discuss d/c planning. Cm awaiting call back.  1423-cm talked with son in regards to d/c planning. Family and pt still want mother to go to facility. Cm completed FL2 to send with facility with long term medicaid beds. Cm send to 54 facilities in region      Expected Discharge Plan and Services                                               Social Determinants of Health (SDOH) Interventions SDOH Screenings   Food Insecurity: Patient Declined (07/28/2022)  Housing: Patient Declined (07/28/2022)  Transportation Needs: Patient Declined (07/28/2022)  Utilities: Patient Declined (07/28/2022)  Tobacco Use: High Risk (08/29/2022)    Readmission Risk Interventions     No data to display

## 2022-09-01 NOTE — TOC Progression Note (Signed)
Transition of Care [facility]) - Progression Note    Patient Details  Name: Melissa Colon MRN: [id number] Date of Birth: 09-03-1960  Transition of Care [facility]) CM/SW Contact  Colette Ribas, [place] Phone Number: 09/01/2022, 4:02 PM  Clinical Narrative:        CSW met with patient bedside for bed offers, she requested we call her husband or brother in law, advised I will. I left voicemail's for both.    Expected Discharge Plan and Services                                               Social Determinants of Health (SDOH) Interventions SDOH Screenings   Food Insecurity: Patient Declined (07/28/2022)  Housing: Patient Declined (07/28/2022)  Transportation Needs: Patient Declined (07/28/2022)  Utilities: Patient Declined (07/28/2022)  Tobacco Use: High Risk (08/29/2022)    Readmission Risk Interventions     No data to display

## 2022-09-01 NOTE — ED Notes (Signed)
Pt up on the bedpan. Urinated 75ml. Bladder scanned pt for PVR of 0ml. Pt pulled up in bed. VSS. Adjusted TV.

## 2022-09-01 NOTE — ED Notes (Signed)
Patient had another BM on bedpan. Bedside cleaning done. Patient sitting up watching tv at this time.

## 2022-09-01 NOTE — ED Notes (Signed)
Pt up on the bedpan

## 2022-09-01 NOTE — ED Notes (Signed)
Pt lunch tray set up. RN cut chicken. Pt able to feed herself.

## 2022-09-01 NOTE — ED Notes (Signed)
Pt on the bedpan. Urinated 75ml. New brief placed. Pt pulled up in bed. Call light within reach. Bed in low position. Pt denies further needs at this time.

## 2022-09-01 NOTE — ED Provider Notes (Signed)
Emergency Medicine Observation Re-evaluation Note  Melissa Colon is a 62 y.o. female, currently boarding in the emergency department.  No acute events since last update. Physical Exam  BP (!) 123/94   Pulse 64   Temp 98.4 F (36.9 C)   Resp 16   Ht 5\' 2"  (1.575 m)   Wt 68 kg   SpO2 99%   BMI 27.44 kg/m   ED Course / MDM  No lab work from the last 3 days to review. Plan  Current plan is for placement to the proper living facility once available.  Social worker is working with the patient to achieve this.    Minna Antis, MD 09/01/22 1958

## 2022-09-01 NOTE — ED Notes (Signed)
Pt continues to sleep with symmetrical chest rise and fall, NAD, call be within reach.

## 2022-09-01 NOTE — ED Notes (Signed)
Pt on the bed pan. Missed the pan. Pt cleaned up and new brief placed. Pt denies further needs at this time.

## 2022-09-01 NOTE — ED Notes (Signed)
Pt urinated on bedpan and had perineum cleaned by 2 EDTs

## 2022-09-01 NOTE — ED Notes (Signed)
Pt ate approximately 20% of her lunch tray.

## 2022-09-01 NOTE — ED Notes (Signed)
Pt ate 50% of meal.

## 2022-09-01 NOTE — ED Notes (Signed)
Patient had BM on bedpan. Bedside cleaning done. New pad and brief placed on patient. Patient repositioned with pillows behind right side. Denies any pain at this time. Restring comfortably. Call light within reach.

## 2022-09-01 NOTE — ED Notes (Signed)
Set up food for pt and placed towels on chest and lap so pt can eat.

## 2022-09-01 NOTE — ED Notes (Signed)
Pt up on the bedpan. Pt continent of urine 75ml.

## 2022-09-01 NOTE — ED Notes (Signed)
Patient resting.

## 2022-09-01 NOTE — ED Notes (Addendum)
Pt urinated, did not get an amount, however pt did get urine on gown so this tech and orientee, [place] NT, changed pt gown

## 2022-09-01 NOTE — ED Notes (Signed)
Pt continues to sleep, NAD, resps unlabored, call bell within reach

## 2022-09-01 NOTE — ED Notes (Signed)
Pt had BM and urination on bedpan, asiisted on and off bp by me. Perineum cleaned and new brief applied. Pt states no there needs at this time, call be within reach

## 2022-09-01 NOTE — ED Notes (Signed)
Pt pulled up in bed. Denies further needs at this time. Bed in low position call light within reach.

## 2022-09-02 LAB — URINE CULTURE: Culture: 50000 — AB

## 2022-09-02 NOTE — ED Notes (Addendum)
Assisted pt with getting off the bedpan, 10 mL urine. Sent urine off to lab. Bed lowered, call light in reach

## 2022-09-02 NOTE — TOC Progression Note (Signed)
Transition of Care [facility]) - Progression Note    Patient Details  Name: Melissa Colon MRN: [id number] Date of Birth: 01-06-1961  [facility] Care [facility]) CM/SW Contact  Colette Ribas, [place] Phone Number: 09/02/2022, 4:49 PM  Clinical Narrative:    CSW spoke with patients brother in law [phone]. He advised they will accept the [place] location, I advised we will need to complete auth for next steps.        Expected Discharge Plan and Services                                               Social Determinants of Health (SDOH) Interventions SDOH Screenings   Food Insecurity: Patient Declined (07/28/2022)  Housing: Patient Declined (07/28/2022)  Transportation Needs: Patient Declined (07/28/2022)  Utilities: Patient Declined (07/28/2022)  Tobacco Use: High Risk (08/29/2022)    Readmission Risk Interventions     No data to display

## 2022-09-02 NOTE — ED Provider Notes (Signed)
Emergency Medicine Observation Re-evaluation Note  Physical Exam   BP 132/88   Pulse 95   Temp 98.4 F (36.9 C)   Resp 16   Ht 5\' 2"  (1.575 m)   Wt 68 kg   SpO2 100%   BMI 27.44 kg/m   Patient appears in no acute distress.  ED Course / MDM   No reported events during my shift at the time of this note.   Pt is awaiting dispo from SW   Pilar Jarvis MD    Pilar Jarvis, MD 09/02/22 [phone]

## 2022-09-03 NOTE — ED Notes (Signed)
Dinner cut up for pt per request. Pt positioned in high fowlers position to eat. No further needs at this time, call light remains within reach.

## 2022-09-03 NOTE — Progress Notes (Signed)
Occupational Therapy Treatment Patient Details Name: Melissa Colon MRN: [id number] DOB: October 03, 1960 Today's Date: 09/03/2022   History of present illness 62 y.o. female past medical history significant for prior posterior fossa brain tumor with resection at age 89, recent infarct of right medial, inferior and right occipital lobe, presents to the emergency department with abdominal pain.   OT comments  Upon entering the room, pt supine in bed and agreeable to OT intervention. Pt has been using bedpan and reports need to have BM. Pt performing min A for bed mobility with assistance for R LE and then stand pivot transfer from bed >BSC. Pt has BM and then PT arrives to room to assist pt with standing while OT assists with hygiene. Pt returning to sit on [facility] at end of session and transitions to PT session. All needs within reach.       If plan is discharge home, recommend the following:  A lot of help with walking and/or transfers;A lot of help with bathing/dressing/bathroom;Assistance with cooking/housework;Assist for transportation;Help with stairs or ramp for entrance;Direct supervision/assist for medications management;Direct supervision/assist for financial management   Equipment Recommendations  None recommended by OT       Precautions / Restrictions Precautions Precautions: Fall Precaution Comments: Residual R Hemiparesis (prior CVAs) Restrictions Weight Bearing Restrictions: No       Mobility Bed Mobility Overal bed mobility: Needs Assistance Bed Mobility: Supine to Sit     Supine to sit: Min assist          Transfers Overall transfer level: Needs assistance Equipment used: 1 person hand held assist Transfers: Sit to/from Stand, Bed to chair/wheelchair/BSC Sit to Stand: Mod assist Stand pivot transfers: Mod assist               Balance Overall balance assessment: Needs assistance Sitting-balance support: Bilateral upper extremity supported Sitting  balance-Leahy Scale: Good     Standing balance support: Bilateral upper extremity supported, During functional activity Standing balance-Leahy Scale: Fair                             ADL either performed or assessed with clinical judgement   ADL Overall ADL's : Needs assistance/impaired                         Toilet Transfer: Moderate assistance;Stand-pivot;BSC/3in1   Toileting- Clothing Manipulation and Hygiene: +2 for physical assistance;Sit to/from stand               Vision Ability to See in Adequate Light: 1 Impaired Patient Visual Report: Diplopia            Cognition Arousal: Alert Behavior During Therapy: WFL for tasks assessed/performed Overall Cognitive Status: No family/caregiver present to determine baseline cognitive functioning                                 General Comments: Pt slow to process but follows commands with increased time.                   Pertinent Vitals/ Pain       Pain Assessment Pain Assessment: No/denies pain         Frequency  Min 1X/week        Progress Toward Goals  OT Goals(current goals can now be found in the care plan section)  Progress towards OT goals:  Progressing toward goals      AM-PAC OT "6 Clicks" Daily Activity     Outcome Measure   Help from another person eating meals?: A Little Help from another person taking care of personal grooming?: A Little Help from another person toileting, which includes using toliet, bedpan, or urinal?: A Lot Help from another person bathing (including washing, rinsing, drying)?: A Lot Help from another person to put on and taking off regular upper body clothing?: A Little Help from another person to put on and taking off regular lower body clothing?: A Lot 6 Click Score: 15    End of Session    OT Visit Diagnosis: Unsteadiness on feet (R26.81);Repeated falls (R29.6);Muscle weakness (generalized) (M62.81)   Activity  Tolerance Patient tolerated treatment well   Patient Left Other (comment) (seated on commode with PT)   Nurse Communication Mobility status        Time: 1420-1440 OT Time Calculation (min): 20 min  Charges: OT General Charges $OT Visit: 1 Visit OT Treatments $Self Care/Home Management : 8-22 mins  Jackquline Denmark, MS, OTR/L , CBIS ascom [phone]  09/03/22, 4:06 PM

## 2022-09-03 NOTE — Progress Notes (Signed)
Physical Therapy Treatment Patient Details Name: Melissa Colon MRN: [id number] DOB: 1960/12/24 Today's Date: 09/03/2022   History of Present Illness 62 y.o. 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    PT Comments  PT was seen after finishing OT session. She agrees to session but limited due to lunch tray arriving and wanting to eat. Pt endorses," Im waiting for my husband/son to come visit.Can you call them and see when they are coming? " Pt was able to stand 2x from [facility] then 5 x from [facility]. Will need +2 assist for any/all gait attempts. Acute PT will continue to follow and progress as able per current POC.     If plan is discharge home, recommend the following: A lot of help with walking and/or transfers;A lot of help with bathing/dressing/bathroom;Assist for transportation;Help with stairs or ramp for entrance     Equipment Recommendations  None recommended by PT       Precautions / Restrictions Precautions Precautions: Fall Precaution Comments: Residual R Hemiparesis (prior CVAs) Restrictions Weight Bearing Restrictions: No     Mobility  Bed Mobility Overal bed mobility: Needs Assistance Bed Mobility: Sit to Supine  Sit to supine: Min assist     Transfers Overall transfer level: Needs assistance Equipment used: 1 person hand held assist Transfers: Sit to/from Stand, Bed to chair/wheelchair/BSC Sit to Stand: Mod assist Stand pivot transfers: Mod assist  General transfer comment: Pt stood 2 x from [facility] then 5 x form [facility]. vcs for technique and safety. Pt lunch tray arrived due to spilling first tray. would benefit form +2 asistance to attempt gait with chair follow next session    Ambulation/Gait  General Gait Details: pt did not ambulate this session due to lunch tray arriving and pt  wanting to eat   Balance Overall balance assessment: Needs assistance Sitting-balance support: Bilateral upper extremity supported Sitting balance-Leahy Scale: Good     Standing balance support: Bilateral upper extremity supported, During functional activity Standing balance-Leahy Scale: Fair Standing balance comment: pt's feet sliding. Recommend new anti-slip socks next session     Cognition Arousal: Alert Behavior During Therapy: WFL for tasks assessed/performed Overall Cognitive Status: No family/caregiver present to determine baseline cognitive functioning      General Comments: Pt slow to process but follows commands with increased time.               Pertinent Vitals/Pain Pain Assessment Pain Assessment: No/denies pain     PT Goals (current goals can now be found in the care plan section) Acute Rehab PT Goals Patient Stated Goal: get home Progress towards PT goals: Progressing toward goals    Frequency    Min 1X/week       AM-PAC PT "6 Clicks" Mobility   Outcome Measure  Help needed turning from your back to your side while in a flat bed without using bedrails?: A Little Help needed moving from lying on your back to sitting on the side of a flat bed without using bedrails?: A Little Help needed moving to and from a bed to a chair (including a wheelchair)?: A Lot Help needed standing up from a chair using your arms (e.g., wheelchair or bedside chair)?: A Lot Help needed to walk in hospital room?: Total Help needed climbing 3-5 steps with a railing? : Total 6 Click Score: 12  End of Session   Activity Tolerance: Patient tolerated treatment well Patient left: in bed;with call bell/phone within reach Nurse Communication: Mobility status PT Visit Diagnosis: Other abnormalities of gait and mobility (R26.89);Muscle weakness (generalized) (M62.81);Hemiplegia and hemiparesis Hemiplegia - Right/Left: Right Hemiplegia - dominant/non-dominant: Dominant      Time: 1441-1456 PT Time Calculation (min) (ACUTE ONLY): 15 min  Charges:    $Therapeutic Activity: 8-22 mins PT General Charges $$ ACUTE PT VISIT: 1 Visit                    Jetta Lout PTA 09/03/22, 3:43 PM

## 2022-09-03 NOTE — ED Notes (Signed)
Pt reports to this nurse she can't see & that she is scared.  EDP made aware.

## 2022-09-03 NOTE — ED Notes (Signed)
Spoke with brother [name] (medical POA) on the phone per request of pt, who states that when pt was in hospital at [facility] she complained 4-5 times of vision changes but the changes always resolved without intervention.  EDP made aware.

## 2022-09-03 NOTE — TOC Progression Note (Addendum)
Transition of Care [facility]) - Progression Note    Patient Details  Name: Melissa Colon MRN: [id number] Date of Birth: [birth year]  Transition of Care [facility]) CM/SW Contact  Kreg Shropshire, RN Phone Number: 09/03/2022, 9:13 AM  Clinical Narrative:     CM called [facility] for pt. Cm LVM for admissions to give call back regarding admission. Cm awaiting to hear back.  Cm received call from [facility] and woke with Lesly Rubenstein [phone] with admissions and she stated she will verify pt medicaid and complete an asset review. She will give information to business office as well to verify her Medicaid. She will call son for asset verification.  1430- Cm reached out to [place] with [facility] with [facility], she stated that they are still waiting on response from [facility]. They will give me a call at end of day.      Expected Discharge Plan and Services                                               Social Determinants of Health (SDOH) Interventions SDOH Screenings   Food Insecurity: Patient Declined (07/28/2022)  Housing: Patient Declined (07/28/2022)  Transportation Needs: Patient Declined (07/28/2022)  Utilities: Patient Declined (07/28/2022)  Tobacco Use: High Risk (08/29/2022)    Readmission Risk Interventions     No data to display

## 2022-09-03 NOTE — ED Provider Notes (Signed)
Emergency Medicine Observation Re-evaluation Note  Physical Exam   BP (!) 99/57 (BP Location: Right Arm)   Pulse 95   Temp 98 F (36.7 C) (Oral)   Resp 18   Ht 5\' 2"  (1.575 m)   Wt 68 kg   SpO2 98%   BMI 27.44 kg/m   Patient appears in no acute distress.  ED Course / MDM   No reported events during my shift at the time of this note.   Pt is awaiting dispo from SW   Pilar Jarvis MD    Pilar Jarvis, MD 09/03/22 [phone]

## 2022-09-03 NOTE — ED Notes (Signed)
EDP at bedside to evaluate, pt report to him she is seeing double.

## 2022-09-04 ENCOUNTER — Emergency Department: Payer: Medicaid Other

## 2022-09-04 MED ORDER — LORAZEPAM 2 MG/ML IJ SOLN
1.0000 mg | Freq: Once | INTRAMUSCULAR | Status: AC
  Administered 2022-09-04: 1 mg via INTRAVENOUS
  Filled 2022-09-04: qty 1

## 2022-09-04 MED ORDER — LORAZEPAM 2 MG/ML IJ SOLN
1.0000 mg | Freq: Once | INTRAMUSCULAR | Status: AC | PRN
  Administered 2022-09-04: 1 mg via INTRAVENOUS
  Filled 2022-09-04: qty 1

## 2022-09-04 NOTE — ED Notes (Signed)
Patient given phone to speak with son.

## 2022-09-04 NOTE — ED Notes (Signed)
Patient cleaned from bedpan. Brief clean and dry. New bed pad placed under patient. Patient pulled up in bed. TV turned on.

## 2022-09-04 NOTE — ED Notes (Signed)
Assisted with tele neuro exam. Pt tolerated well, no new orders at this time.

## 2022-09-04 NOTE — ED Provider Notes (Addendum)
Emergency Medicine Observation Re-evaluation Note  Physical Exam   BP (!) 159/83 (BP Location: Left Arm)   Pulse (!) 124   Temp 97.6 F (36.4 C) (Oral)   Resp 20   Ht 5\' 2"  (1.575 m)   Wt 68 kg   SpO2 100%   BMI 27.44 kg/m   Patient appears in no acute distress.  ED Course / MDM  Patient reports new onset visual changes.  At first to the nurse she planed of complete visual loss, no pain, but feels very anxious about her visual loss.  When I went to assess her immediately she says that she feels she is able to see but is seeing double.  She is able to track me.  Ox her ocular movements are intact, midrange reactive pupils.  She reports no pain.  She has residual unilateral deficits from prior stroke, reports unchanged from prior.  She is not compliant with full visual acuity testing though she states that her left eye seems more blurry than the right.  I ordered for a CT scan of the head without contrast and a stat neurologic consult.   Our nurse then called the patient's sibling who reports that this patient has complained of intermittent visual changes multiple times over the last week.  -- Spoke with neurologist who recommends MRI.  Unfortunately not a thrombolytic candidate given history of bleeding, prior stroke.  MRI to see if extension of previous stroke, already on dual antiplatelet, unlikely to change medical management.  -- MRI resulted, no acute changes from known prior stroke.  Pilar Jarvis MD    Pilar Jarvis, MD 09/04/22 Aretha Parrot    Pilar Jarvis, MD 09/04/22 1610    Pilar Jarvis, MD 09/04/22 0600

## 2022-09-04 NOTE — ED Notes (Signed)
Chaplain at bedside

## 2022-09-04 NOTE — TOC Transition Note (Signed)
Transition of Care [facility]) - CM/SW Discharge Note   Patient Details  Name: Melissa Colon MRN: [id number] Date of Birth: Feb 27, 1960  Transition of Care [facility]) CM/SW Contact:  Kreg Shropshire, RN Phone Number: 09/04/2022, 3:31 PM   Clinical Narrative:     Patient will DC to: home Anticipated DC date: 09/04/22 Family notified: Son [name] Transport by: Non ems transport  Per MD patient ready for DC to . RN, patient, patient's family,  Ambulance transport requested for patient.  TOC signing off.   Final next level of care: Home/Self Care Barriers to Discharge: Continued Medical Work up   Patient Goals and CMS Choice      Discharge Placement                  Patient to be transferred to facility by: Non ems transport Name of family member notified: [name] Patient and family notified of of transfer: 09/04/22  Discharge Plan and Services Additional resources added to the After Visit Summary for                                       Social Determinants of Health (SDOH) Interventions SDOH Screenings   Food Insecurity: Patient Declined (07/28/2022)  Housing: Patient Declined (07/28/2022)  Transportation Needs: Patient Declined (07/28/2022)  Utilities: Patient Declined (07/28/2022)  Tobacco Use: High Risk (08/29/2022)     Readmission Risk Interventions     No data to display

## 2022-09-04 NOTE — TOC Progression Note (Addendum)
Transition of Care [facility]) - Progression Note    Patient Details  Name: Melissa Colon MRN: [id number] Date of Birth: 1960-12-08  Transition of Care [facility]) CM/SW Contact  Melissa Shropshire, RN Phone Number: 09/04/2022, 8:21 AM  Clinical Narrative:     Cm reached out to Manual [place] with [facility] [phone] to inquire about getting PCA help for family at home. Velna Hatchet is going to send me a packet to give to family for them to complete. Cm unable to get a bed for pt for long term help.    1038- Cm called [place] with [facility] [phone] and she stated they are waiting for DSS to get back with them regarding pt Medicaid. They are waiting on verification today. She will call me once she gets more information.  1351- Cm spoke with pt son [name] and informed him that pt cannot go to facility due to funding issues. Cm sent him via email on how to get Medicaid PCA information. Cm notified nursing team as well. Pt can come back at 4pm via non emergent ems transport. Cm confirmed with son the address on file.      Expected Discharge Plan and Services                                               Social Determinants of Health (SDOH) Interventions SDOH Screenings   Food Insecurity: Patient Declined (07/28/2022)  Housing: Patient Declined (07/28/2022)  Transportation Needs: Patient Declined (07/28/2022)  Utilities: Patient Declined (07/28/2022)  Tobacco Use: High Risk (08/29/2022)    Readmission Risk Interventions     No data to display

## 2022-09-04 NOTE — ED Notes (Signed)
Patient refused lunch. Reports she is still full from breakfast. Patient given coffee per request

## 2022-09-04 NOTE — ED Provider Notes (Signed)
-----------------------------------------   2:29 PM on 09/04/2022 -----------------------------------------  Per social work, the patient has been unable to be successfully placed.  The [facility] team has discussed with the patient's family who are comfortable taking her home.  At this time there is no evidence of an emergency condition requiring inpatient treatment.  The patient is stable for discharge home.  Return precautions have been provided.   Dionne Bucy, MD 09/04/22 1430

## 2022-09-04 NOTE — ED Notes (Signed)
Patient transported to CT 

## 2022-09-04 NOTE — Consult Note (Signed)
[facility] TeleNeurology Consult Services  Stat Consult  Patient Name:   Melissa Colon, Melissa Colon Date of Birth:   12/10/1960 Identification Number:   MRN - [id number] Date of Service:   09/03/2022 23:55:56  Diagnosis:       H53.8 - Blurred Vision  Impression 62 year old female who complained of sudden worsening of her vision. Presentation may be due to recrudescence of prior stroke vs TIA vs recurrent PCA strokes.   Recommendations: Our recommendations are outlined below.  Diagnostic Studies : MRI head without contrast  Antithrombotic Medication : Statins for LDL goal less than 70 Continue home antiplatelets  Nursing Recommendations : IV Fluids, avoid dextrose containing fluids, Maintain euglycemiaNeuro checks q4 hrs x 24 hrs and then per shiftHead of bed 30 degreesContinue with Telemetry  DVT Prophylaxis : Choice of Primary Team  Disposition : Neurology will follow   ----------------------------------------------------------------------------------------------------    Metrics: [facility] Notification Time: 09/03/2022 23:52:55 Stamp Time: 09/03/2022 23:55:56 Callback Response Time: 09/04/2022 00:05:09  Primary Provider Notified of Diagnostic Impression and Management Plan on: 09/04/2022 01:08:05   CT HEAD: Reviewed    ----------------------------------------------------------------------------------------------------  Chief Complaint: vision disturbance  History of Present Illness: Patient is a 62 year old Female. 62 year old female who is currently being held in the ED for social reasons. Patient has a history of right occipital stroke in July and prior left thalamic hemorrhage in 2021 and recent GI bleed. Today patient complained of complete vision followed by increased blurred vision in the left eye compared with the right. It is unclear what her baseline vision problems are however per family patient has been complaining of  episodes of vision loss/disturbances for the past few weeks. 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   Past Medical History:      Hypertension      Hyperlipidemia      Stroke  Medications:  No Anticoagulant use  Antiplatelet use: Yes Aspirin and Brilinta Reviewed EMR for current medications  Allergies:  Reviewed  Social History: Drug Use: No  Family History:  There is no family history of premature cerebrovascular disease pertinent to this consultation  ROS : 14 Points Review of Systems was performed and was negative except mentioned in HPI.  Past Surgical History: There Is No Surgical History Contributory To Today's Visit   Examination: BP(159/83), AOZHY(865), 1A: Level of Consciousness - Alert; keenly responsive + 0 1B: Ask Month and Age - Both Questions Right + 0 1C: Blink Eyes & Squeeze Hands - Performs Both Tasks + 0 2: Test Horizontal Extraocular Movements - Normal + 0 3: Test Visual Fields - Complete Hemianopia + 2 4: Test Facial Palsy (Use Grimace if Obtunded) - Normal symmetry + 0 5A: Test Left Arm Motor Drift - No Drift for 10 Seconds + 0 5B: Test Right Arm Motor Drift - Drift, hits bed + 2 6A: Test Left Leg Motor Drift - No Drift for 5 Seconds + 0 6B: Test Right Leg Motor Drift - No Drift for 5 Seconds + 0 7: Test Limb Ataxia (FNF/Heel-Shin) - No Ataxia + 0 8: Test Sensation - Normal; No sensory loss + 0 9: Test Language/Aphasia - Normal; No aphasia + 0 10: Test Dysarthria - Mild-Moderate Dysarthria: Slurring but can be understood + 1 11: Test Extinction/Inattention - No abnormality + 0  NIHSS Score: 5  Spoke with : Dr. Modesto Charon    This consult was conducted in real time  using interactive audio and Immunologist. Patient was informed of the technology being used for this visit and agreed to proceed. Patient located in hospital and provider located at home/office setting.  Patient is being  evaluated for possible acute neurologic impairment and high probability of imminent or life - threatening deterioration.I spent total of 35 minutes providing care to this patient, including time for face to face visit via telemedicine, review of medical records, imaging studies and discussion of findings with providers, the patient and / or family.   Dr Joice Lofts   [facility] For Inpatient follow-up with [facility] physician please call [facility] [phone]. This is not an outpatient service. Post hospital discharge, please contact hospital directly.  Please do not communicate with [facility] physicians via secure chat. If you have any questions, Please contact [facility]. Please call or reconsult our service if there are any clinical or diagnostic changes.

## 2022-09-04 NOTE — ED Notes (Signed)
Patient transported to MRI 

## 2022-09-04 NOTE — Discharge Instructions (Addendum)
Some PCP options in [place] area- not a comprehensive list  [facility]- [phone] [facility]- [phone] [facility]- [phone] [facility]- [phone] [facility]- [phone] [facility]- [phone]  or [facility] Physician Referral Line [phone]    Private Pay Resources  [facility] Address: [address] [place], [place] [address] Phone: [phone]  [facility] Address: [address], [place], [place] [address] Phone: [phone]  [facility] Address: [address] [place], [place] [address] Phone: [phone]  [facility] Address: [address], [place], [place] [address] Phone: [phone]  [facility] Address: [address] [place], [place], [place] [address] Phone: [phone]  [facility] Phone: [phone]  [facility] Address:  [address], [place], [place] [address] Phone:  [phone]  [facility] Address:  [address] Phone:  [phone]  [email or web address]  [facility] [employer] Phone: [phone]

## 2022-09-04 NOTE — Progress Notes (Signed)
Chap visited Pt at bedside while on routine round. Pt drinking coffee. Chap introduced Spiritual and emotional care. Pt says she [employer] but [employer] attend. Being curious why her lunch was set aside I consulted with the nurse who noted that Pt declined lunch. Chap services remains available as needed.   09/04/22 1500  Spiritual Encounters  Type of Visit Initial  Care provided to: Patient  Conversation partners present during encounter Nurse  Referral source Chaplain assessment  Reason for visit Routine spiritual support  OnCall Visit No  Interventions  Spiritual Care Interventions Made Compassionate presence;Mindfulness intervention  Intervention Outcomes  Outcomes Connection to spiritual care;Reduced isolation  Spiritual Care Plan  Spiritual Care Issues Still Outstanding No further spiritual care needs at this time (see row info)

## 2022-09-05 ENCOUNTER — Emergency Department (HOSPITAL_COMMUNITY): Payer: Medicaid Other

## 2022-09-05 ENCOUNTER — Inpatient Hospital Stay (HOSPITAL_COMMUNITY)
Admission: EM | Admit: 2022-09-05 | Discharge: 2022-09-13 | DRG: 378 | Disposition: A | Discharge status: Home Health | Payer: Medicaid Other | Attending: Internal Medicine | Admitting: Internal Medicine

## 2022-09-05 ENCOUNTER — Other Ambulatory Visit: Payer: Self-pay

## 2022-09-05 ENCOUNTER — Encounter (HOSPITAL_COMMUNITY): Payer: Self-pay

## 2022-09-05 DIAGNOSIS — R7401 Elevation of levels of liver transaminase levels: Secondary | ICD-10-CM | POA: Diagnosis not present

## 2022-09-05 DIAGNOSIS — I951 Orthostatic hypotension: Secondary | ICD-10-CM | POA: Diagnosis present

## 2022-09-05 DIAGNOSIS — R109 Unspecified abdominal pain: Secondary | ICD-10-CM

## 2022-09-05 DIAGNOSIS — N281 Cyst of kidney, acquired: Secondary | ICD-10-CM | POA: Diagnosis present

## 2022-09-05 DIAGNOSIS — K921 Melena: Secondary | ICD-10-CM | POA: Diagnosis present

## 2022-09-05 DIAGNOSIS — I69320 Aphasia following cerebral infarction: Secondary | ICD-10-CM | POA: Diagnosis not present

## 2022-09-05 DIAGNOSIS — E872 Acidosis, unspecified: Secondary | ICD-10-CM | POA: Diagnosis present

## 2022-09-05 DIAGNOSIS — I639 Cerebral infarction, unspecified: Secondary | ICD-10-CM | POA: Insufficient documentation

## 2022-09-05 DIAGNOSIS — F17210 Nicotine dependence, cigarettes, uncomplicated: Secondary | ICD-10-CM | POA: Diagnosis present

## 2022-09-05 DIAGNOSIS — I6502 Occlusion and stenosis of left vertebral artery: Secondary | ICD-10-CM | POA: Diagnosis present

## 2022-09-05 DIAGNOSIS — R2990 Unspecified symptoms and signs involving the nervous system: Principal | ICD-10-CM

## 2022-09-05 DIAGNOSIS — R000 Tachycardia, unspecified: Secondary | ICD-10-CM

## 2022-09-05 DIAGNOSIS — Z993 Dependence on wheelchair: Secondary | ICD-10-CM

## 2022-09-05 DIAGNOSIS — I69351 Hemiplegia and hemiparesis following cerebral infarction affecting right dominant side: Secondary | ICD-10-CM

## 2022-09-05 DIAGNOSIS — Z7902 Long term (current) use of antithrombotics/antiplatelets: Secondary | ICD-10-CM

## 2022-09-05 DIAGNOSIS — I69322 Dysarthria following cerebral infarction: Secondary | ICD-10-CM

## 2022-09-05 DIAGNOSIS — Z7982 Long term (current) use of aspirin: Secondary | ICD-10-CM

## 2022-09-05 DIAGNOSIS — D62 Acute posthemorrhagic anemia: Secondary | ICD-10-CM | POA: Diagnosis present

## 2022-09-05 DIAGNOSIS — R0600 Dyspnea, unspecified: Secondary | ICD-10-CM

## 2022-09-05 DIAGNOSIS — E876 Hypokalemia: Secondary | ICD-10-CM | POA: Diagnosis present

## 2022-09-05 DIAGNOSIS — R4701 Aphasia: Secondary | ICD-10-CM

## 2022-09-05 DIAGNOSIS — K922 Gastrointestinal hemorrhage, unspecified: Secondary | ICD-10-CM | POA: Diagnosis not present

## 2022-09-05 DIAGNOSIS — H547 Unspecified visual loss: Secondary | ICD-10-CM | POA: Diagnosis present

## 2022-09-05 DIAGNOSIS — Z923 Personal history of irradiation: Secondary | ICD-10-CM | POA: Diagnosis not present

## 2022-09-05 DIAGNOSIS — Z833 Family history of diabetes mellitus: Secondary | ICD-10-CM

## 2022-09-05 DIAGNOSIS — I10 Essential (primary) hypertension: Secondary | ICD-10-CM | POA: Diagnosis present

## 2022-09-05 DIAGNOSIS — R195 Other fecal abnormalities: Secondary | ICD-10-CM | POA: Diagnosis not present

## 2022-09-05 DIAGNOSIS — K5521 Angiodysplasia of colon with hemorrhage: Secondary | ICD-10-CM | POA: Diagnosis not present

## 2022-09-05 DIAGNOSIS — K31819 Angiodysplasia of stomach and duodenum without bleeding: Secondary | ICD-10-CM | POA: Diagnosis not present

## 2022-09-05 DIAGNOSIS — D500 Iron deficiency anemia secondary to blood loss (chronic): Secondary | ICD-10-CM

## 2022-09-05 DIAGNOSIS — E785 Hyperlipidemia, unspecified: Secondary | ICD-10-CM | POA: Diagnosis present

## 2022-09-05 DIAGNOSIS — K449 Diaphragmatic hernia without obstruction or gangrene: Secondary | ICD-10-CM | POA: Diagnosis present

## 2022-09-05 DIAGNOSIS — I6623 Occlusion and stenosis of bilateral posterior cerebral arteries: Secondary | ICD-10-CM | POA: Diagnosis present

## 2022-09-05 DIAGNOSIS — Z79899 Other long term (current) drug therapy: Secondary | ICD-10-CM | POA: Diagnosis not present

## 2022-09-05 DIAGNOSIS — K31811 Angiodysplasia of stomach and duodenum with bleeding: Secondary | ICD-10-CM | POA: Diagnosis present

## 2022-09-05 DIAGNOSIS — D649 Anemia, unspecified: Secondary | ICD-10-CM

## 2022-09-05 DIAGNOSIS — K862 Cyst of pancreas: Secondary | ICD-10-CM

## 2022-09-05 DIAGNOSIS — Z8673 Personal history of transient ischemic attack (TIA), and cerebral infarction without residual deficits: Secondary | ICD-10-CM

## 2022-09-05 DIAGNOSIS — I679 Cerebrovascular disease, unspecified: Secondary | ICD-10-CM | POA: Diagnosis not present

## 2022-09-05 DIAGNOSIS — Z1211 Encounter for screening for malignant neoplasm of colon: Secondary | ICD-10-CM | POA: Diagnosis not present

## 2022-09-05 DIAGNOSIS — I63531 Cerebral infarction due to unspecified occlusion or stenosis of right posterior cerebral artery: Secondary | ICD-10-CM | POA: Diagnosis not present

## 2022-09-05 LAB — DIFFERENTIAL
Abs Immature Granulocytes: 0.07 10*3/uL (ref 0.00–0.07)
Basophils Absolute: 0.1 10*3/uL (ref 0.0–0.1)
Basophils Relative: 0 %
Eosinophils Absolute: 0 10*3/uL (ref 0.0–0.5)
Eosinophils Relative: 0 %
Immature Granulocytes: 1 %
Lymphocytes Relative: 11 %
Lymphs Abs: 1.6 10*3/uL (ref 0.7–4.0)
Monocytes Absolute: 0.7 10*3/uL (ref 0.1–1.0)
Monocytes Relative: 5 %
Neutro Abs: 11.4 10*3/uL — ABNORMAL HIGH (ref 1.7–7.7)
Neutrophils Relative %: 83 %

## 2022-09-05 LAB — CBC
HCT: 20.4 % — ABNORMAL LOW (ref 36.0–46.0)
Hemoglobin: 6.1 g/dL — CL (ref 12.0–15.0)
MCH: 25.3 pg — ABNORMAL LOW (ref 26.0–34.0)
MCHC: 29.9 g/dL — ABNORMAL LOW (ref 30.0–36.0)
MCV: 84.6 fL (ref 80.0–100.0)
Platelets: 499 10*3/uL — ABNORMAL HIGH (ref 150–400)
RBC: 2.41 MIL/uL — ABNORMAL LOW (ref 3.87–5.11)
RDW: 15.6 % — ABNORMAL HIGH (ref 11.5–15.5)
WBC: 13.9 10*3/uL — ABNORMAL HIGH (ref 4.0–10.5)
nRBC: 0 % (ref 0.0–0.2)

## 2022-09-05 LAB — COMPREHENSIVE METABOLIC PANEL
ALT: 21 U/L (ref 0–44)
AST: 18 U/L (ref 15–41)
Albumin: 3.3 g/dL — ABNORMAL LOW (ref 3.5–5.0)
Alkaline Phosphatase: 121 U/L (ref 38–126)
Anion gap: 11 (ref 5–15)
BUN: 23 mg/dL — ABNORMAL HIGH (ref 6–20)
CO2: 18 mmol/L — ABNORMAL LOW (ref 22–32)
Calcium: 8.6 mg/dL — ABNORMAL LOW (ref 8.9–10.3)
Chloride: 110 mmol/L (ref 98–111)
Creatinine, Ser: 0.79 mg/dL (ref 0.44–1.00)
GFR, Estimated: >60 mL/min (ref >60)
Glucose, Bld: 121 mg/dL — ABNORMAL HIGH (ref 70–99)
Potassium: 3.5 mmol/L (ref 3.5–5.1)
Sodium: 139 mmol/L (ref 135–145)
Total Bilirubin: 0.6 mg/dL (ref 0.3–1.2)
Total Protein: 6.9 g/dL (ref 6.5–8.1)

## 2022-09-05 LAB — RETICULOCYTES
Immature Retic Fract: 37.4 % — ABNORMAL HIGH (ref 2.3–15.9)
RBC.: 2.44 MIL/uL — ABNORMAL LOW (ref 3.87–5.11)
Retic Count, Absolute: 124.4 10*3/uL (ref 19.0–186.0)
Retic Ct Pct: 5.1 % — ABNORMAL HIGH (ref 0.4–3.1)

## 2022-09-05 LAB — RAPID URINE DRUG SCREEN, HOSP PERFORMED
Amphetamines: NOT DETECTED
Barbiturates: NOT DETECTED
Benzodiazepines: NOT DETECTED
Cocaine: NOT DETECTED
Opiates: NOT DETECTED
Tetrahydrocannabinol: NOT DETECTED

## 2022-09-05 LAB — HCG, SERUM, QUALITATIVE: Preg, Serum: NEGATIVE

## 2022-09-05 LAB — I-STAT CHEM 8, ED
BUN: 23 mg/dL — ABNORMAL HIGH (ref 6–20)
Calcium, Ion: 1.11 mmol/L — ABNORMAL LOW (ref 1.15–1.40)
Chloride: 112 mmol/L — ABNORMAL HIGH (ref 98–111)
Creatinine, Ser: 0.7 mg/dL (ref 0.44–1.00)
Glucose, Bld: 114 mg/dL — ABNORMAL HIGH (ref 70–99)
HCT: 20 % — ABNORMAL LOW (ref 36.0–46.0)
Hemoglobin: 6.8 g/dL — CL (ref 12.0–15.0)
Potassium: 3.7 mmol/L (ref 3.5–5.1)
Sodium: 142 mmol/L (ref 135–145)
TCO2: 17 mmol/L — ABNORMAL LOW (ref 22–32)

## 2022-09-05 LAB — TROPONIN I (HIGH SENSITIVITY)
Troponin I (High Sensitivity): 12 ng/L (ref <18)
Troponin I (High Sensitivity): 16 ng/L (ref <18)

## 2022-09-05 LAB — PREPARE RBC (CROSSMATCH)

## 2022-09-05 LAB — ABO/RH: ABO/RH(D): B POS

## 2022-09-05 LAB — IRON AND TIBC
Iron: 20 ug/dL — ABNORMAL LOW (ref 28–170)
Saturation Ratios: 5 % — ABNORMAL LOW (ref 10.4–31.8)
TIBC: 398 ug/dL (ref 250–450)
UIBC: 378 ug/dL

## 2022-09-05 LAB — BRAIN NATRIURETIC PEPTIDE: B Natriuretic Peptide: 53.4 pg/mL (ref 0.0–100.0)

## 2022-09-05 LAB — URINALYSIS, ROUTINE W REFLEX MICROSCOPIC
Bilirubin Urine: NEGATIVE
Glucose, UA: NEGATIVE mg/dL
Hgb urine dipstick: NEGATIVE
Ketones, ur: 5 mg/dL — AB
Leukocytes,Ua: NEGATIVE
Nitrite: NEGATIVE
Protein, ur: NEGATIVE mg/dL
Specific Gravity, Urine: >1.046 — ABNORMAL HIGH (ref 1.005–1.030)
pH: 5 (ref 5.0–8.0)

## 2022-09-05 LAB — VITAMIN B12: Vitamin B-12: 214 pg/mL (ref 180–914)

## 2022-09-05 LAB — CBG MONITORING, ED
Glucose-Capillary: 106 mg/dL — ABNORMAL HIGH (ref 70–99)
Glucose-Capillary: 96 mg/dL (ref 70–99)

## 2022-09-05 LAB — FERRITIN: Ferritin: 6 ng/mL — ABNORMAL LOW (ref 11–307)

## 2022-09-05 LAB — FOLATE: Folate: 34.3 ng/mL (ref >5.9)

## 2022-09-05 LAB — APTT: aPTT: 26 seconds (ref 24–36)

## 2022-09-05 LAB — ETHANOL: Alcohol, Ethyl (B): <10 mg/dL (ref <10)

## 2022-09-05 LAB — PROTIME-INR
INR: 1.1 (ref 0.8–1.2)
Prothrombin Time: 14.3 seconds (ref 11.4–15.2)

## 2022-09-05 MED ORDER — IOHEXOL 350 MG/ML SOLN
65.0000 mL | Freq: Once | INTRAVENOUS | Status: AC | PRN
  Administered 2022-09-05: 65 mL via INTRAVENOUS

## 2022-09-05 MED ORDER — SODIUM CHLORIDE 0.9 % IV SOLN
100.0000 mg | Freq: Once | INTRAVENOUS | Status: AC
  Administered 2022-09-06: 100 mg via INTRAVENOUS
  Filled 2022-09-05: qty 5

## 2022-09-05 MED ORDER — SODIUM CHLORIDE 0.9% IV SOLUTION: Freq: Once | INTRAVENOUS | Status: AC

## 2022-09-05 MED ORDER — LACTATED RINGERS IV BOLUS
500.0000 mL | Freq: Once | INTRAVENOUS | Status: AC
  Administered 2022-09-05: 500 mL via INTRAVENOUS

## 2022-09-05 MED ORDER — PANTOPRAZOLE 80MG IVPB - SIMPLE MED
80.0000 mg | Freq: Once | INTRAVENOUS | Status: AC
  Administered 2022-09-05: 80 mg via INTRAVENOUS
  Filled 2022-09-05: qty 100

## 2022-09-05 MED ORDER — ONDANSETRON HCL 4 MG/2ML IJ SOLN
4.0000 mg | Freq: Once | INTRAMUSCULAR | Status: AC
  Administered 2022-09-05: 4 mg via INTRAVENOUS
  Filled 2022-09-05: qty 2

## 2022-09-05 MED ORDER — IOHEXOL 350 MG/ML SOLN
100.0000 mL | Freq: Once | INTRAVENOUS | Status: AC | PRN
  Administered 2022-09-05: 100 mL via INTRAVENOUS

## 2022-09-05 MED ORDER — PANTOPRAZOLE SODIUM 40 MG IV SOLR
40.0000 mg | Freq: Two times a day (BID) | INTRAVENOUS | Status: DC
  Administered 2022-09-06 – 2022-09-10 (×9): 40 mg via INTRAVENOUS
  Filled 2022-09-05 (×9): qty 10

## 2022-09-05 MED ORDER — SODIUM CHLORIDE 0.9% IV SOLUTION: Freq: Once | INTRAVENOUS | Status: DC

## 2022-09-05 MED ORDER — FENTANYL CITRATE PF 50 MCG/ML IJ SOSY
25.0000 ug | PREFILLED_SYRINGE | Freq: Once | INTRAMUSCULAR | Status: AC
  Administered 2022-09-05: 25 ug via INTRAVENOUS
  Filled 2022-09-05: qty 1

## 2022-09-05 NOTE — H&P (Signed)
History and Physical    Patient: Melissa Colon ZOX:[id number] DOB: 1960-08-31 DOA: 09/05/2022 DOS: the patient was seen and examined on 09/05/2022 PCP: Pcp, No  Patient coming from: Home  Chief Complaint:  Chief Complaint  Patient presents with   Stroke Symptoms   HPI: Melissa Colon is a 62 y.o. 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   Patient was hospitalized for acute ischemic right MCA stroke in July with reports of medication non-compliance. She was then admitted to rehab 07/31/22. On 08/07/22 she had symptoms of diplopia and had MRI which showed mixed acute and subacute infarcts in the medial, inferior aspect of the right occipital lobe. Foci of petechial hemorrhage within the areas of subacute infarct. No significant mass effect. Patient's Plavix was changed to Brilinta 90 mg twice daily and would also continue low-dose aspirin. Hospital course complicated by anemia hemoglobin dipping to 11.9-8.6 with Hemoccult stool positive. Gastroenterology services consulted undergoing upper GI endoscopy 08/12/2022 per Dr. Elnoria Howard showing mucosal changes in the esophagus consistent with esophagitis. Erosive gastropathy with stigmata of recent bleeding. 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 Placed on PPI. She was discharged home on 08/17/22 and continued on dual antiplatelet therapy and plans for possible outpatient colonoscopy.   She later presented to [facility] on 08/29/22 with abdominal pain thought secondary to left ureteral calculus. She received pain management in the ED while awaiting for placement to a facility. Then yesterday while in ED she reports new onset visual changes, neurology was consulted and  MRI obtained demonstrated unresolved right PCA territory ischemic since last month but no significant progression. No malignant hemorrhagic transformation or mass effect. She was then discharged home 8/20 as she was unsuccessfully placed into facility per social work.   Patient provides limited history due to her dysarthria. Per ED documentation, she was on the toilet around 1315 today and developed worsening neuro symptoms. On EMS arrival, she was aphasic and right side was flaccid. BP of 107/77 and symptoms improved with lying down.   In the ED she presents as a code stroke. No tPA given due to hx of recent stroke and ICH.  CT head with no hemorrhage or acute infarct. Redemonstrated right PCA territory infarct. CTA head and neck with no emergent findings. Advanced intracranial branch atherosclerosis similar to prior.    CTA chest negative for pulmonary embolism.  CT abd/pelvis with no acute findings.   CBC notable for worsening anemia Hgb of 6.1 down from baseline of 7-8. Rectal exam per ED physician revealed gross melena. 2 units of pRBC was ordered.   Neurology recommended obtaining repeat MRI brain and to hold on antiplatelet for now until anemia has stabilized.   Patient reports generalized weakness and some blurry vision with intermittent headache.    Review of Systems: unable to review all systems due to the inability of the patient to answer questions. Past Medical History:  Diagnosis Date   Alcohol dependence (HCC)    H/O brain tumor    Smoker    Past Surgical History:  Procedure Laterality Date   CRANIOTOMY FOR TUMOR     ESOPHAGOGASTRODUODENOSCOPY (EGD) WITH PROPOFOL N/A 08/12/2022   Procedure: ESOPHAGOGASTRODUODENOSCOPY (EGD) WITH PROPOFOL;  Surgeon: Jeani Hawking, MD;  Location: [facility] ENDOSCOPY;  Service: Gastroenterology;  Laterality: N/A;  Social History:  reports that she has been smoking cigarettes. She has never used smokeless tobacco. She reports current alcohol use of  about 8.0 standard drinks of alcohol per week. She reports that she does not currently use drugs.  No Known Allergies  Family History  Problem Relation Age of Onset   Diabetes Mother     Prior to Admission medications   Medication Sig Start Date End Date Taking? Authorizing Provider  aspirin EC 81 MG tablet Take 1 tablet (81 mg total) by mouth daily. Swallow whole. 07/29/22   Leroy Sea, MD  atorvastatin (LIPITOR) 80 MG tablet Take 1 tablet (80 mg total) by mouth daily. 08/16/22   Angiulli, Mcarthur Rossetti, PA-C  diclofenac Sodium (VOLTAREN) 1 % GEL Apply 2 g topically 4 (four) times daily. 08/16/22   Angiulli, Mcarthur Rossetti, PA-C  docusate sodium (COLACE) 100 MG capsule Take 1 capsule (100 mg total) by mouth daily. 08/16/22   Angiulli, Mcarthur Rossetti, PA-C  folic acid (FOLVITE) 1 MG tablet Take 1 tablet (1 mg total) by mouth daily. 08/16/22   Angiulli, Mcarthur Rossetti, PA-C  meclizine (ANTIVERT) 25 MG tablet Take 1 tablet (25 mg total) by mouth 3 (three) times daily as needed for dizziness or nausea. 08/16/22   Angiulli, Mcarthur Rossetti, PA-C  methocarbamol (ROBAXIN) 750 MG tablet Take 1 tablet (750 mg total) by mouth every 8 (eight) hours as needed for muscle spasms. 08/16/22   Angiulli, Mcarthur Rossetti, PA-C  Multiple Vitamin (MULTIVITAMIN WITH MINERALS) TABS tablet Take 1 tablet by mouth daily. 08/16/22   Angiulli, Mcarthur Rossetti, PA-C  pantoprazole (PROTONIX) 40 MG tablet Take 1 tablet (40 mg total) by mouth daily. 08/16/22   Angiulli, Mcarthur Rossetti, PA-C  polyethylene glycol (MIRALAX / GLYCOLAX) 17 g packet Take 17 g by mouth daily. 08/16/22   Angiulli, Mcarthur Rossetti, PA-C  ticagrelor (BRILINTA) 90 MG TABS tablet Take 1 tablet (90 mg total) by mouth 2 (two) times daily. 08/16/22   Charlton Amor, PA-C    Physical Exam: Vitals:   09/05/22 2200 09/05/22 2230 09/05/22 2243 09/05/22 2245  BP: (!) 123/57 (!) 98/47 (!) 96/58 112/63  Pulse: (!) 107  (!) 101 (!) 111  Resp: (!) 22  18 (!) 22  Temp:      TempSrc:      SpO2: 100%  100% 100%  Weight:        Constitutional: NAD, calm, comfortable, chronically ill appearing female with generalized pallor Eyes: PERRL, lids and conjunctivae normal ENMT: Mucous membranes are moist.  Neck: normal, supple Respiratory: clear to auscultation bilaterally, no wheezing, no crackles. Normal respiratory effort. No accessory muscle use.  Cardiovascular: Regular rate and rhythm, no murmurs / rubs / gallops. No extremity edema.  Abdomen: soft, non-distended with diffuse tenderness. No rebound tenderness, guarding or rigidity. Bowel sounds positive.  Musculoskeletal: no clubbing / cyanosis. No joint deformity upper and lower extremities. Normal muscle tone.  Skin: no rashes, lesions, ulcers. No induration Neurologic: CN 2-12 grossly intact. Sensation intact. Right sided facial droop with dysarthria and trouble word finding. Right upper extremity paralysis. Barely able to lift right LE against gravity.  Psychiatric: Normal judgment and insight. Alert and oriented x 3. Normal mood.   Data Reviewed: {Tip this will not be part of the note when signed- Document your independent interpretation of telemetry tracing, EKG, lab, Radiology test or any other diagnostic tests. Add any new diagnostic test ordered today. (Optional):[id number]} See HPI  Assessment and Plan: * Acute blood loss anemia -  previous hgb a month ago was normal around 12. Downward trended while in inpatient rehab and underwent endoscopy on 08/12/2022 per Dr. Elnoria Howard Esophagitis dissecans was noted, but this was not the source of bleeding. Multiple small nonbleeding gastric antral erosions were found. There was some evidence of mild hematin. Two small AVMs in D2 were found and they were no bleeding. No treatment was applied as she was currently on Brilinta -rectal exam today revealed gross melena. Hgb has downward trended from 8.6 ->7.9->6.1 today -will transfer 2 units pRBC and follow post H/H -continue BID PPI -iron panel showed deficiency - will give IV  iron. Normal B12 and folic level.  -hold dual antiplatelet therapy per neurology -ED physician has sent message to [facility] to consult  Aphasia -pt found to have worsening right sided weakness and aphasia by EMS. Symptoms have improved in ED but still has dysarthria.  -symptoms likely recrudesce of recent stroke in the setting of worsening anemia -MRI brain is pending- will follow -per neurology, DAPT will be held in the setting of active bleed.  History of CVA (cerebrovascular accident) -recent right ischemic MCA stroke in July place on aspirin and Brilina-on hold with active GI bleed  Pancreatic cyst -follow up MRI in 2 years recommended      Advance Care Planning: Full  Consults: neurology, message sent to GI Dr. Elnoria Howard  Family Communication: ***  Severity of Illness: The appropriate patient status for this patient is INPATIENT. Inpatient status is judged to be reasonable and necessary in order to provide the required intensity of service to ensure the patient's safety. The patient's presenting symptoms, physical exam findings, and initial radiographic and laboratory data in the context of their chronic comorbidities is felt to place them at high risk for further clinical deterioration. Furthermore, it is not anticipated that the patient will be medically stable for discharge from the hospital within 2 midnights of admission.   * I certify that at the point of admission it is my clinical judgment that the patient will require inpatient hospital care spanning beyond 2 midnights from the point of admission due to high intensity of service, high risk for further deterioration and high frequency of surveillance required.*  Author: Anselm Jungling, DO 09/05/2022 11:21 PM  For on call review [email or web address].

## 2022-09-05 NOTE — ED Notes (Signed)
Pts brother called, updated provided. Brother requesting notification once pt is admitted

## 2022-09-05 NOTE — Assessment & Plan Note (Signed)
-  follow up MRI in 2 years recommended

## 2022-09-05 NOTE — ED Notes (Signed)
Patient transported to MRI 

## 2022-09-05 NOTE — Assessment & Plan Note (Addendum)
-  previous hgb a month ago was normal around 12. Downward trended while in inpatient rehab and underwent endoscopy on 08/12/2022 per Dr. Elnoria Howard Esophagitis dissecans was noted, but this was not the source of bleeding. Multiple small nonbleeding gastric antral erosions were found. There was some evidence of mild hematin. Two small AVMs in D2 were found and they were no bleeding. No treatment was applied as she was currently on Brilinta -rectal exam today revealed gross melena. Hgb has downward trended from 8.6 ->7.9->6.1 today -will transfer 2 units pRBC and follow post H/H -continue BID PPI -iron panel showed deficiency - will give IV iron. Normal B12 and folic level.  -hold dual antiplatelet therapy per neurology -ED physician has sent message to [facility] to consult

## 2022-09-05 NOTE — ED Provider Notes (Signed)
Melissa Colon Provider Note   CSN: [id number] Arrival date & time: 09/05/22  1453  An emergency department physician performed an initial assessment on this suspected stroke patient at 1501.  History  Chief Complaint  Patient presents with   Stroke Symptoms   Melissa Colon is a 62 y.o. 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  Patient was unable to provide a history due to dysarthria, so brother was called for collateral information.  Per brother, patient was admitted at [facility] in the past month and was in [facility] for the past week and a half.  She was discharged home from the ED yesterday on DAPT and was feeling better.  At that time, she could see and could talk fluently with a slight slur, which is her baseline.  This morning she remained at her baseline until she used the toilet for 15 minutes, lost consciousness and fell sideways.  Since then she was  has had notable dysarthria and complained of acute vision loss. He denies noticing any other recent symptoms.  At baseline she largely remains in a hospital bed and transfers to a wheelchair.  Her speech is slurred at baseline but understandable, and she does not use her complete right side.   On arrival in the emergency department, code stroke was called. Last known well was 1315 today. CT head demonstrated an evolving subacute infarct within the right parafalcine occipital lobe. CTA head and neck showed no acute abnormality but redemonstrated advanced bilateral PCA narrowing, left vertebral artery stenosis. MRI brain redemonstrated PCA territory ischemia from last month, but no significant  progression or new intracranial abnormalities.  Hemoglobin was low at 6.8, consent for transfusion obtained from brother, who is her POA. He denies noticing any recent bleeding or dark stools.   Per chart review, the anemia was noted last admission and GI was consulted for an endoscopy on 08/12/2022 with Dr. 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 She was started on a PPI with plans for an outpatient colonoscopy, which has not been completed.     Home Medications Prior to Admission medications   Medication Sig Start Date End Date Taking? Authorizing Provider  aspirin EC 81 MG tablet Take 1 tablet (81 mg total) by mouth daily. Swallow whole. 07/29/22   Leroy Sea, MD  atorvastatin (LIPITOR) 80 MG tablet Take 1 tablet (80 mg total) by mouth daily. 08/16/22   Angiulli, Mcarthur Rossetti, PA-C  diclofenac Sodium (VOLTAREN) 1 % GEL Apply 2 g topically 4 (four) times daily. 08/16/22   Angiulli, Mcarthur Rossetti, PA-C  docusate sodium (COLACE) 100 MG capsule Take 1 capsule (100 mg total) by mouth daily. 08/16/22   Angiulli, Mcarthur Rossetti, PA-C  folic acid (FOLVITE) 1 MG tablet Take 1 tablet (1 mg total) by mouth daily. 08/16/22   Angiulli, Mcarthur Rossetti, PA-C  meclizine (ANTIVERT) 25 MG tablet Take 1 tablet (25 mg total) by mouth 3 (three) times daily as needed for dizziness or nausea. 08/16/22   Angiulli, Mcarthur Rossetti, PA-C  methocarbamol (ROBAXIN) 750 MG tablet Take 1 tablet (750 mg total) by mouth every 8 (  eight) hours as needed for muscle spasms. 08/16/22   Angiulli, Mcarthur Rossetti, PA-C  Multiple Vitamin (MULTIVITAMIN WITH MINERALS) TABS tablet Take 1 tablet by mouth daily. 08/16/22   Angiulli, Mcarthur Rossetti, PA-C  pantoprazole (PROTONIX) 40 MG tablet Take 1 tablet (40 mg total) by mouth daily. 08/16/22   Angiulli, Mcarthur Rossetti, PA-C  polyethylene glycol (MIRALAX / GLYCOLAX) 17 g packet Take 17 g by mouth daily. 08/16/22   Angiulli,  Mcarthur Rossetti, PA-C  ticagrelor (BRILINTA) 90 MG TABS tablet Take 1 tablet (90 mg total) by mouth 2 (two) times daily. 08/16/22   Angiulli, Mcarthur Rossetti, PA-C      Allergies    Patient has no known allergies.    Review of Systems   Review of Systems  Unable to perform ROS: Other  Constitutional: Negative.   HENT: Negative.    Eyes:  Positive for visual disturbance.       Vision loss in both eyes  Respiratory:  Positive for shortness of breath.   Cardiovascular: Negative.   Gastrointestinal:  Positive for abdominal pain.  Endocrine: Negative.   Genitourinary: Negative.   Musculoskeletal: Negative.   Skin: Negative.   Allergic/Immunologic: Negative.   Neurological:  Positive for speech difficulty.  Hematological: Negative.   Psychiatric/Behavioral: Negative.    ROS limited by dysarthria.   Physical Exam Updated Vital Signs BP 106/74   Pulse (!) 120   Temp 98 F (36.7 C) (Oral)   Resp 20   Wt 70.2 kg   SpO2 100%   BMI 28.31 kg/m  Physical Exam HENT:     Head: Normocephalic and atraumatic.     Mouth/Throat:     Mouth: Mucous membranes are moist.  Eyes:     Comments: Patient states "I can't" when I ask her to follow my finger. Unable to count fingers.   Cardiovascular:     Rate and Rhythm: Regular rhythm. Tachycardia present.  Pulmonary:     Effort: Pulmonary effort is normal.     Breath sounds: Normal breath sounds.  Abdominal:     Palpations: Abdomen is soft.     Tenderness: There is abdominal tenderness.     Comments: Diffuse tenderness in abdomen  Musculoskeletal:     Comments: 0/5 strength on right, 2/5 on left, unable to hold legs up against gravity   Skin:    Coloration: Skin is pale.  Neurological:     Mental Status: She is alert.     Sensory: Sensory deficit present.     Comments: Diffuse sensory deficits on right in upper and lower extremities; left-sided sensation in tact. Orientation assessment limited by dysarthria.     ED Results / Procedures / Treatments    Labs (all labs ordered are listed, but only abnormal results are displayed) Labs Reviewed  CBC - Abnormal; Notable for the following components:      Result Value   WBC 13.9 (*)    RBC 2.41 (*)    Hemoglobin 6.1 (*)    HCT 20.4 (*)    MCH 25.3 (*)    MCHC 29.9 (*)    RDW 15.6 (*)    Platelets 499 (*)    All other components within normal limits  DIFFERENTIAL - Abnormal; Notable for the following components:   Neutro Abs 11.4 (*)    All other components within normal limits  COMPREHENSIVE METABOLIC PANEL - Abnormal; Notable for the following components:   CO2 18 (*)    Glucose, Bld 121 (*)    BUN  23 (*)    Calcium 8.6 (*)    Albumin 3.3 (*)    All other components within normal limits  URINALYSIS, ROUTINE W REFLEX MICROSCOPIC - Abnormal; Notable for the following components:   Color, Urine STRAW (*)    Specific Gravity, Urine >1.046 (*)    Ketones, ur 5 (*)    All other components within normal limits  IRON AND TIBC - Abnormal; Notable for the following components:   Iron 20 (*)    Saturation Ratios 5 (*)    All other components within normal limits  FERRITIN - Abnormal; Notable for the following components:   Ferritin 6 (*)    All other components within normal limits  RETICULOCYTES - Abnormal; Notable for the following components:   Retic Ct Pct 5.1 (*)    RBC. 2.44 (*)    Immature Retic Fract 37.4 (*)    All other components within normal limits  I-STAT CHEM 8, ED - Abnormal; Notable for the following components:   Chloride 112 (*)    BUN 23 (*)    Glucose, Bld 114 (*)    Calcium, Ion 1.11 (*)    TCO2 17 (*)    Hemoglobin 6.8 (*)    HCT 20.0 (*)    All other components within normal limits  CBG MONITORING, ED - Abnormal; Notable for the following components:   Glucose-Capillary 106 (*)    All other components within normal limits  ETHANOL  PROTIME-INR  APTT  RAPID URINE DRUG SCREEN, HOSP PERFORMED  HCG, SERUM, QUALITATIVE  VITAMIN B12  FOLATE  BRAIN  NATRIURETIC PEPTIDE  TYPE AND SCREEN  PREPARE RBC (CROSSMATCH)  ABO/RH  PREPARE RBC (CROSSMATCH)  TROPONIN I (HIGH SENSITIVITY)  TROPONIN I (HIGH SENSITIVITY)   EKG EKG Interpretation Date/Time:  Wednesday September 05 2022 19:52:15 EDT Ventricular Rate:  120 PR Interval:  136 QRS Duration:  86 QT Interval:  313 QTC Calculation: 443 R Axis:   44  Text Interpretation: Sinus tachycardia Low voltage, precordial leads Borderline repolarization abnormality No significant change since last tracing Confirmed by Alvira Monday ([id number]) on 09/05/2022 8:29:16 PM  Radiology CT Angio Chest PE W and/or Wo Contrast  Result Date: 09/05/2022 CLINICAL DATA:  Pulmonary embolism (PE) suspected, high prob shortness of breath. EXAM: CT ANGIOGRAPHY CHEST WITH CONTRAST TECHNIQUE: Multidetector CT imaging of the chest was performed using the standard protocol during bolus administration of intravenous contrast. Multiplanar CT image reconstructions and MIPs were obtained to evaluate the vascular anatomy. RADIATION DOSE REDUCTION: This exam was performed according to the departmental dose-optimization program which includes automated exposure control, adjustment of the mA and/or kV according to patient size and/or use of iterative reconstruction technique. CONTRAST:  65mL OMNIPAQUE IOHEXOL 350 MG/ML SOLN COMPARISON:  None Available. FINDINGS: Cardiovascular: No evidence of embolism to the proximal subsegmental pulmonary artery level. Normal cardiac size. No pericardial effusion. No aortic aneurysm. There are coronary artery calcifications, in keeping with coronary artery disease. There are also moderate to severe peripheral atherosclerotic vascular calcifications of thoracic aorta and its major branches. Mediastinum/Nodes: Asymmetrically enlarged and heterogeneous right thyroid lobe without discrete nodule is not well evaluated on the current exam. 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 There is short segment  marked narrowing versus complete occlusion at the origin of the left vertebral artery, not well evaluated on the current exam. No solid / cystic mediastinal masses. The esophagus is nondistended precluding optimal assessment. No axillary, mediastinal or hilar lymphadenopathy by size criteria. Lungs/Pleura: The  central tracheo-bronchial tree is patent. There are patchy areas of linear, plate-like atelectasis and/or scarring throughout bilateral lungs. No mass or consolidation. No pleural effusion or pneumothorax. No suspicious lung nodules. Upper Abdomen: There is a partially exophytic 2.0 x 2.4 cm cyst arising from the left kidney upper pole, posteriorly. There is a 1.6 x 1.8 cm hypoattenuating structure in the posterior aspect of the pancreatic body, not well evaluated on the current exam but unchanged since the prior study dating back to 09/13/2021. Remaining visualized upper abdominal viscera within normal limits. Please refer to simultaneously acquired but separately dictated CT scan abdomen and pelvis report for additional details. Musculoskeletal: The visualized soft tissues of the chest wall are grossly unremarkable. No suspicious osseous lesions. There are mild multilevel degenerative changes in the visualized spine. Review of the MIP images confirms the above findings. IMPRESSION: 1. No evidence of pulmonary embolism. No acute intrathoracic pathology identified. 2. Short segment marked narrowing versus complete occlusion at the origin of the hypoplastic left vertebral artery, not well evaluated on the current exam. 3. Multiple other nonacute observations, as described above. Aortic Atherosclerosis (ICD10-I70.0). Electronically Signed   By: Jules Schick M.D.   On: 09/05/2022 18:41   CT ABDOMEN PELVIS W CONTRAST  Result Date: 09/05/2022 CLINICAL DATA:  Acute abdominal pain EXAM: CT ABDOMEN AND PELVIS WITH CONTRAST TECHNIQUE: Multidetector CT imaging of the abdomen and pelvis was performed using the  standard protocol following bolus administration of intravenous contrast. RADIATION DOSE REDUCTION: This exam was performed according to the departmental dose-optimization program which includes automated exposure control, adjustment of the mA and/or kV according to patient size and/or use of iterative reconstruction technique. CONTRAST:  65mL OMNIPAQUE IOHEXOL 350 MG/ML SOLN COMPARISON:  CT abdomen and pelvis 08/29/2022 FINDINGS: Lower chest: No acute abnormality. Hepatobiliary: No focal liver abnormality is seen. No gallstones, gallbladder wall thickening, or biliary dilatation. Pancreas: Cystic pancreatic lesion in the body of the pancreas measures 14 mm and appears unchanged. The pancreas is otherwise within normal limits. Spleen: Normal in size without focal abnormality. Adrenals/Urinary Tract: There is contrast in the bladder and renal collecting systems which limits evaluation for small calculi. There is no hydronephrosis or perinephric fat stranding. There is a cyst in the superior pole of the left kidney measuring 2.1 cm. The adrenal glands and bladder are within normal limits. Stomach/Bowel: There is a small hiatal hernia. Stomach is otherwise within normal limits. Appendix appears normal. No evidence of bowel wall thickening, distention, or inflammatory changes. Vascular/Lymphatic: Aortic atherosclerosis. No enlarged abdominal or pelvic lymph nodes. Reproductive: Uterus and bilateral adnexa are unremarkable. Other: No abdominal wall hernia or abnormality. No abdominopelvic ascites. Musculoskeletal: No acute or significant osseous findings. IMPRESSION: 1. No acute localizing process in the abdomen or pelvis. 2. Small hiatal hernia. 3. Stable cystic lesion in the body of the pancreas. Recommend follow-up MRI in 2 years to confirm stability. 4. Left Bosniak I benign renal cyst measuring 2.1 cm. No follow-up imaging is recommended. JACR 2018 Feb; 264-273, Management of the Incidental Renal Mass on CT,  [employer] 2021; [id number], Bosniak Classification of Cystic Renal Masses, Version 2019. Aortic Atherosclerosis (ICD10-I70.0). Electronically Signed   By: Darliss Cheney M.D.   On: 09/05/2022 18:31   CT ANGIO HEAD NECK W WO CM W PERF (CODE STROKE)  Result Date: 09/05/2022 CLINICAL DATA:  Acute stroke suspected EXAM: CT ANGIOGRAPHY HEAD AND NECK CT PERFUSION BRAIN TECHNIQUE: Multidetector CT imaging of the head and neck was performed using the standard protocol during  bolus administration of intravenous contrast. Multiplanar CT image reconstructions and MIPs were obtained to evaluate the vascular anatomy. Carotid stenosis measurements (when applicable) are obtained utilizing NASCET criteria, using the distal internal carotid diameter as the denominator. Multiphase CT imaging of the brain was performed following IV bolus contrast injection. Subsequent parametric perfusion maps were calculated using RAPID software. RADIATION DOSE REDUCTION: This exam was performed according to the departmental dose-optimization program which includes automated exposure control, adjustment of the mA and/or kV according to patient size and/or use of iterative reconstruction technique. CONTRAST:  OMNIPAQUE IOHEXOL 350 MG/ML SOLN COMPARISON:  07/27/2022 FINDINGS: CTA NECK FINDINGS Aortic arch: Atheromatous wall thickening. Three vessel branching. No acute finding Right carotid system: Mild atheromatous plaque at the bifurcation without significant stenosis. No ulceration or beading Left carotid system: No significant stenosis, ulceration, or beading Vertebral arteries: No proximal subclavian stenosis. The right vertebral artery is dominant. Severe stenosis with non visible lumen at the left vertebral origin. The left vertebral artery is diffusely attenuated appearing relative to the transverse foramen size. Skeleton: No acute finding.  Occipital craniectomy. Other neck: No acute finding. Upper chest: Mildly focal emphysema.  No  acute finding Review of the MIP images confirms the above findings CTA HEAD FINDINGS Anterior circulation: Atheromatous calcification along the carotid siphons. No major branch occlusion, beading, or flow reducing stenosis. Widespread intracranial atheromatous irregularity with high-grade branch narrowing seen diffusely, affecting the ACA and MCA circulation bilaterally. Difficult arterial visualization of the left MCA bifurcation due to closely neighboring crossing vein. No aneurysm or vascular malformation Posterior circulation: Atheromatous irregularity of the right vertebral and basilar arteries without flow reducing stenosis. Tiny left vertebral artery with limited contribution to the posterior circulation. Extensive atheromatous type irregularity of the posterior cerebral arteries with flow gap at the right P2 segment and high-grade narrowings at the left P2 and P3. Venous sinuses: Unremarkable for arterial timing Anatomic variants: None significant Review of the MIP images confirms the above findings CT Brain Perfusion Findings: CBF (<30%) Volume: 3mL Perfusion (Tmax>6.0s) volume: 13mL Mismatch Volume: 10mL Infarction Location:Right parasagittal parietal lobe, site of restricted diffusion on preceding CT with core infarct underestimated compared to MRI. IMPRESSION: 1. No emergent finding. 2. Advanced intracranial branch atherosclerosis similar to priors. Especially advanced bilateral PCA narrowing with flow gap at the right P2 segment 3. Thready flow throughout the left vertebral artery with severe stenosis or occlusion at the origin. 4. CT perfusion deficit matching brain MRI from yesterday. Electronically Signed   By: Tiburcio Pea M.D.   On: 09/05/2022 15:39   CT HEAD CODE STROKE WO CONTRAST  Result Date: 09/05/2022 CLINICAL DATA:  Code stroke.  Right-sided weakness EXAM: CT HEAD WITHOUT CONTRAST TECHNIQUE: Contiguous axial images were obtained from the base of the skull through the vertex without  intravenous contrast. RADIATION DOSE REDUCTION: This exam was performed according to the departmental dose-optimization program which includes automated exposure control, adjustment of the mA and/or kV according to patient size and/or use of iterative reconstruction technique. COMPARISON:  CT Head 09/04/22, MR Head 09/04/22 FINDINGS: Brain: No hemorrhage. No hydrocephalus. No extra-axial fluid collection. Redemonstrated right PCA territory infarct with chronic calcifications along the medial aspect of the bilateral occipital lobes. 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 Unchanged chronic calcification in the left cerebellar hemisphere. No CT evidence of an acute cortical infarct. Unchanged chronic infarct in the left parietal lobe. Unchanged size and shape of the ventricular system. Vascular: No  hyperdense vessel or unexpected calcification. Skull: Prior suboccipital craniotomy. Sinuses/Orbits: No middle ear or mastoid effusion. Paranasal sinuses clear. Orbits are unremarkable. Other: None. ASPECTS [facility] Colon Stroke Program Early CT Score): 10 when accounting for chronic findings. IMPRESSION: 1. No hemorrhage or CT evidence of an acute cortical infarct. 2. Redemonstrated right PCA territory infarct with chronic calcifications along the medial aspect of the bilateral occipital lobes. 3. 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 Findings were paged to Dr. Roda Shutters on 09/05/22 at 3:19 PM via [facility] paging system. Electronically Signed   By: Lorenza Cambridge M.D.   On: 09/05/2022 15:23   MR BRAIN WO CONTRAST  Result Date: 09/04/2022 CLINICAL DATA:  62 year old female with neurologic deficit and new vision changes. Right PCA territory ischemia last month. EXAM: MRI HEAD WITHOUT CONTRAST TECHNIQUE: Multiplanar, multiecho pulse sequences of the brain and surrounding  structures were obtained without intravenous contrast. COMPARISON:  Head CT 0027 hours today. Recent brain MRI 08/07/2022 and earlier. FINDINGS: Brain: Persistent diffusion abnormality in the medial right PCA territory, with a similar configuration and slightly less intense than demonstrated on 08/07/2022. Some of the diffusion there remains restricted. Mild cytotoxic edema. No malignant hemorrhagic transformation or mass effect. No restricted diffusion in any other vascular territory. Chronic infarcts with encephalomalacia in the posterior left MCA and left MCA/PCA watershed, bilateral cerebellum (severe on the right), bilateral deep gray nuclei and right brainstem. Confluent chronic blood products in the left cerebral peduncle, right cerebellar hemisphere. Fairly numerous additional chronic microhemorrhages which are concentrated in the vascular territories described above appear stable from last month. No superimposed midline shift, mass effect, evidence of mass lesion, ventriculomegaly, extra-axial collection or acute intracranial hemorrhage. Cervicomedullary junction and pituitary are within normal limits. Vascular: Major intracranial vascular flow voids are stable. Chronically abnormal distal left vertebral artery. See CTA head and neck last month. Skull and upper cervical spine: Cervical spine motion artifact today. Bone marrow signal remains normal. Sinuses/Orbits: Stable, negative orbits and sinuses. Other: Stable mild left mastoid air cell fluid. Visible internal auditory structures appear normal. IMPRESSION: 1. Unresolved Right PCA territory ischemia since last month, but no significant progression. No malignant hemorrhagic transformation or associated mass effect. 2. Otherwise stable advanced chronic ischemic disease in the brain with multiple areas of chronic hemorrhage. 3. No new intracranial abnormality. Electronically Signed   By: Odessa Fleming M.D.   On: 09/04/2022 05:48   DG Chest Portable 1  View  Result Date: 09/04/2022 CLINICAL DATA:  MRI clearance EXAM: PORTABLE CHEST 1 VIEW COMPARISON:  None Available. FINDINGS: The heart size and mediastinal contours are within normal limits. Both lungs are clear. The visualized skeletal structures are unremarkable. No unexpected metallic foreign body within the visualized thorax. IMPRESSION: No active disease. Electronically Signed   By: Helyn Numbers M.D.   On: 09/04/2022 03:53   CT Head Wo Contrast  Result Date: 09/04/2022 CLINICAL DATA:  Acute neurologic deficit, vision change EXAM: CT HEAD WITHOUT CONTRAST TECHNIQUE: Contiguous axial images were obtained from the base of the skull through the vertex without intravenous contrast. RADIATION DOSE REDUCTION: This exam was performed according to the departmental dose-optimization program which includes automated exposure control, adjustment of the mA and/or kV according to patient size and/or use of iterative reconstruction technique. COMPARISON:  MRI 08/07/2022 FINDINGS: Brain: Evolving subacute infarct within the right parafalcine occipital lobe. Remote cortical infarct within the left parietal cortex, lacunar infarcts within the left basal ganglia and left thalamus,  and extensive encephalomalacia within the right cerebellar hemisphere which changes of right suboccipital craniotomy again noted. No acute intracranial hemorrhage or infarct. No abnormal mass effect or midline shift. Dystrophic calcifications again noted within the except ule cortices bilaterally and left cerebellar hemisphere. Ventricular size is normal. Vascular: No hyperdense vessel or unexpected calcification. Skull: Normal. Negative for fracture or focal lesion. Sinuses/Orbits: No acute finding. Other: Mastoid air cells and middle ear cavities are clear. IMPRESSION: 1. Evolving subacute infarct within the right parafalcine occipital lobe. No acute intracranial hemorrhage or infarct. No abnormal mass effect or midline shift. 2. Stable  encephalomalacia within the right cerebellar hemisphere with changes of right suboccipital craniotomy. 3. Stable remote infarcts as outlined above. Electronically Signed   By: Helyn Numbers M.D.   On: 09/04/2022 01:00    Procedures Procedures   Medications Ordered in ED Medications  0.9 %  sodium chloride infusion (Manually program via Guardrails IV Fluids) (has no administration in time range)  0.9 %  sodium chloride infusion (Manually program via Guardrails IV Fluids) (has no administration in time range)  iohexol (OMNIPAQUE) 350 MG/ML injection 100 mL (100 mLs Intravenous Contrast Given 09/05/22 1524)  pantoprazole (PROTONIX) 80 mg /NS 100 mL IVPB (0 mg Intravenous Stopped 09/05/22 1926)  ondansetron (ZOFRAN) injection 4 mg (4 mg Intravenous Given 09/05/22 1852)  fentaNYL (SUBLIMAZE) injection 25 mcg (25 mcg Intravenous Given 09/05/22 1955)  lactated ringers bolus 500 mL (0 mLs Intravenous Stopped 09/05/22 2033)  iohexol (OMNIPAQUE) 350 MG/ML injection 65 mL (65 mLs Intravenous Contrast Given 09/05/22 1752)  lactated ringers bolus 500 mL (0 mLs Intravenous Stopped 09/05/22 2032)    ED Course/ Medical Decision Making/ A&P                                 Medical Decision Making Amount and/or Complexity of Data Reviewed Labs: ordered. Radiology: ordered.  Risk Prescription drug management.  This patient presents to the ED for concern of acute stroke. This involves an extensive number of treatment options, and is a complaint that carries with it a high risk of complications and morbidity. She was subsequently found to have an acute blood loss anemia.  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   External records from outside source obtained and reviewed including her prior hospital  admission from last month at [facility].   Lab Tests:  I Ordered, and personally interpreted labs.  The pertinent results include:    CBC - normocytic anemia, hemoglobin 6.1, MCV 84.6.  Mild leukocytosis white blood cells 13.9.  Platelets 499. CMP - unremarkable Anemia panel - ferritin 6, iron 28, TIBC 398, consistent with iron deficiency Guaiac - positive Troponin - 12 BNP - 53 B12, folate WNL  Imaging Studies:  I ordered imaging studies including CT head noncontrast, MRI brain, PE study, CT abdomen/pelvis  I independently visualized and interpreted imaging:  CT head demonstrated subacute infarct in the right occipital lobe  CTA head and neck demonstrated advanced bilateral PCA narrowing, left vertebral artery stenosis  MRI brain redemonstrated right PCA territory ischemia, unchanged, no acute abnormality  CT abdomen and pelvis w/ contrast showed a small hiatal hernia, small pancreatic and left renal cysts, no acute processes  I agree with the radiologist interpretations  Cardiac Monitoring/ECG:  The patient was maintained on a cardiac monitor.  I personally viewed and interpreted the cardiac monitored which showed an underlying rhythm of: sinus tachycardia  Medicines ordered and prescription drug management:  I ordered medication including  Medications  0.9 %  sodium chloride infusion (Manually program via Guardrails IV Fluids) (has no administration in time range)  0.9 %  sodium chloride infusion (Manually program via Guardrails IV Fluids) (has no administration in time range)  iohexol (OMNIPAQUE) 350 MG/ML injection 100 mL (100 mLs Intravenous Contrast Given 09/05/22 1524)  pantoprazole (PROTONIX) 80 mg /NS 100 mL IVPB (0 mg Intravenous Stopped 09/05/22 1926)  ondansetron (ZOFRAN) injection 4 mg (4 mg Intravenous Given 09/05/22 1852)  fentaNYL (SUBLIMAZE) injection 25 mcg (25 mcg Intravenous Given 09/05/22 1955)  lactated ringers bolus 500 mL (0 mLs Intravenous Stopped  09/05/22 2033)  iohexol (OMNIPAQUE) 350 MG/ML injection 65 mL (65 mLs Intravenous Contrast Given 09/05/22 1752)  lactated ringers bolus 500 mL (0 mLs Intravenous Stopped 09/05/22 2032)   for hypotension  Reevaluation of the patient after these medicines showed that the patient improved  I have reviewed the patients home medicines and have made adjustments as needed   Critical Interventions:  2 unit pRBC  Consultations Obtained: Neurology, Gastroenterology  MDM:   This patient is a 62 year old female with a prior history of right-sided CVA in 2021 and 2024 who presented with new stroke-like symptoms including vision loss and dysarthria.  CT head did not show any acute ischemia, but did demonstrate a subacute infarct in the right occipital lobe.  CTA head and neck redemonstrated advanced bilateral PCA narrowing, left vertebral artery stenosis. MRI brain showed no acute abnormality.   She also was found to have a normocytic anemia with a hemoglobin of 6.1. Guaiac was positive.  She was transfused with 2 units pRBCs. We consulted GI and gave her IV Protonix.  Since imaging is unremarkable, it is possible that her new stroke-like symptoms were secondary to acute blood loss anemia and orthostatic hypotension.   On exam, her speech is slurred and she is dysarthric.  Her strength, mobility, and sensation on the right side are almost completely diminished.  She is also quite weak on the left side.  Her abdomen is soft but she has diffuse abdominal tenderness, so we ordered a CT abdomen pelvis with contrast.  It did not show any acute processes that would explain the pain.  She also reported some shortness of breath. Given her chronic immobility and recent hospital stays, we ordered a CT angiogram to rule out PE, which was negative. She remains hemodynamically stable and will need inpatient admission for management and work up of her GI bleed.   Disposition:  After consideration of the diagnostic  results and the patients response to treatment, I feel that the patent would benefit from admission and further workup for her acute blood loss anemia secondary to a GI bleed.     Annett Fabian, MD 09/05/22 2100    Alvira Monday, MD 09/06/22 1200

## 2022-09-05 NOTE — ED Triage Notes (Signed)
PT BIB EMS from [place] for stroke like symptoms.  Last known well 1315 today.

## 2022-09-05 NOTE — Code Documentation (Addendum)
Melissa Colon is a 62 yr old female with a PMH of CVA, ICH, brain tumor, GIB presenting to [facility] via EMS on 09/05/2022. Pt is from home where she was last known to be in usual state of health at 1315, family said she suddenly got flaccid in rt arm (is usually just weak), and was unable to speak. EMS was called, and code stroke activated.    Pt arrived to [facility], was met by stroke team at bridge. Per EMS, RA strength and speech improving in route. Airway cleared by EDP, CBG, labs drawn. Pt is pale and ill- appearing, looks older than age. NIHSS 13. Pt with Lt hemianopia, rt droop, right hemiplegia, aphasic and dysarthric. The following imaging was obtained: CT, CTA. Per Dr. Roda Shutters, CT neg for acute abnormality. Also per Dr. Roda Shutters, no LVO or acute change. (Has known severe stenoses). Pt BP lowish (100/50) and HBG 6.1.     Pt returned to ED room 34 where her w/u will continue. Pt will need q 2 hr NIHSS and VS. Pt is not eligible for thrombolytics as hx of ICH, recent stroke, anemic. She is not eligible for thrombectomy as MRS 4, and no new LVO. Bedside handoff with ED RN complete.

## 2022-09-05 NOTE — ED Notes (Signed)
Pt denies any new nausea/vomiting and/or distress. No signs of reaction noted

## 2022-09-05 NOTE — Assessment & Plan Note (Signed)
-  recent right ischemic MCA stroke in July place on aspirin and Brilina-on hold with active GI bleed

## 2022-09-05 NOTE — Assessment & Plan Note (Signed)
-  pt found to have worsening right sided weakness and aphasia by EMS. Symptoms have improved in ED but still has dysarthria.  -symptoms likely recrudesce of recent stroke in the setting of worsening anemia -MRI brain is pending- will follow -per neurology, DAPT will be held in the setting of active bleed.

## 2022-09-05 NOTE — Consult Note (Addendum)
Stroke Neurology Consultation Note  Consult Requested by: Dr. Tomma Lightning  Reason for Consult: code stroke  Consult Date: 09/05/22   The history was obtained from the EMS and chart.  During history and examination, all items were able to obtain unless otherwise noted.  History of Present Illness:  Melissa Colon is a 62 y.o. 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 MRA showed left P2 high grade stenosis. CUS neg. EF 60-65%. LDL 92 and A1C 5.3. discharge to CIR  Pt had right PCA stroke in 07/2022. CTA head & neck multifocal severe P2 PCA stenosis, critical stenosis versus occlusion of distal right ACA, severe right P2 PCA, severe right M2 MCA small nondominant left intradural vertebral artery with severe stenosis and distal occlusion, moderate distal basilar artery stenosis, moderate paraclinoid ICA stenosis laterally. EF 65-70%. LDL 123 and A1C 5.6. she was put on ASA and plavix for 3 months and lipitor 80.Residue left visual field deficit.  She was discharged to CIR.   While in rehab, she had worsening vision on the left, MRI done showed some more acute right PCA on the subacute PCA infarcts. She was started on ASA and brilinta. she found to have gradual decline of Hb. Stool occult blood was positive. 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 Patient's hemoglobin has remained stable after her EGD procedure. Patient continues on antiplatelet therapy. GI plan for outpatient GI follow-up.   She was in [facility] ED from 08/29/22 to  09/04/22 for kidney stone, and also anemia. Her Hb continues to have gradual decline. Yesterday early morning, tele neurology was called for worsening vision. MRI showed continued evolution of right PCA infarct. She was continued on ASA and brilinta. She was later discharged home.   Today EMS received call that pt at 1315 went to bathroom. While on the toilet pt developed worsening neuro symptoms. Right side body weaker than normal, and not talking. On EMS arrival, pt BP was 130s initially, R side flaccid, not talking. En route to ED, BP down to 107/77, but symptoms started to improve while lying down. In ER, I met pt at the bridge on arrival at 1453, pt started talking more although still dysarthria, right UE and LE barely against gravity, probably close to baseline. Per EMS, pt at home wheelchair bound. Found to have severe anemia with Hb 6.1.   LSN: [id number] tPA Given: No: ICH 2021 and recent stroke 07/2022 IR; no LVO, and mRS = 4 mRS = 4  Past Medical History:  Diagnosis Date   Alcohol dependence (HCC)    H/O brain tumor    Smoker     Past Surgical History:  Procedure Laterality Date   CRANIOTOMY FOR TUMOR     ESOPHAGOGASTRODUODENOSCOPY (EGD) WITH PROPOFOL N/A 08/12/2022   Procedure: ESOPHAGOGASTRODUODENOSCOPY (EGD) WITH PROPOFOL;  Surgeon: Jeani Hawking, MD;  Location: [facility] ENDOSCOPY;  Service: Gastroenterology;  Laterality: N/A;    Family History  Problem Relation Age of Onset   Diabetes Mother     Social History:  reports that she has been smoking cigarettes. She has never used smokeless tobacco. She reports current alcohol  use of about 8.0 standard drinks of alcohol per week. She reports that she does not currently use drugs.  Allergies: No Known Allergies  No current facility-administered medications on file prior to encounter.   Current Outpatient Medications on File Prior to Encounter  Medication Sig Dispense Refill   aspirin EC 81 MG tablet Take 1 tablet (81 mg total) by mouth  daily. Swallow whole. 30 tablet 12   atorvastatin (LIPITOR) 80 MG tablet Take 1 tablet (80 mg total) by mouth daily. 30 tablet 0   diclofenac Sodium (VOLTAREN) 1 % GEL Apply 2 g topically 4 (four) times daily. 100 g 0   docusate sodium (COLACE) 100 MG capsule Take 1 capsule (100 mg total) by mouth daily.     folic acid (FOLVITE) 1 MG tablet Take 1 tablet (1 mg total) by mouth daily. 30 tablet 0   meclizine (ANTIVERT) 25 MG tablet Take 1 tablet (25 mg total) by mouth 3 (three) times daily as needed for dizziness or nausea. 30 tablet 0   methocarbamol (ROBAXIN) 750 MG tablet Take 1 tablet (750 mg total) by mouth every 8 (eight) hours as needed for muscle spasms. 60 tablet 0   Multiple Vitamin (MULTIVITAMIN WITH MINERALS) TABS tablet Take 1 tablet by mouth daily.     pantoprazole (PROTONIX) 40 MG tablet Take 1 tablet (40 mg total) by mouth daily. 30 tablet 0   polyethylene glycol (MIRALAX / GLYCOLAX) 17 g packet Take 17 g by mouth daily.     ticagrelor (BRILINTA) 90 MG TABS tablet Take 1 tablet (90 mg total) by mouth 2 (two) times daily. 60 tablet 0    Review of Systems: A full ROS was attempted today and was able to be performed.  Systems assessed include - Constitutional, Eyes, HENT, Respiratory, Cardiovascular, Gastrointestinal, Genitourinary, Integument/breast, Hematologic/lymphatic, Musculoskeletal, Neurological, Behavioral/Psych, Endocrine, Allergic/Immunologic - with pertinent responses as per HPI.  Physical Examination: Temp:  [97.9 F (36.6 C)] 97.9 F (36.6 C) (08/20 1655) Pulse Rate:  [67] 67 (08/20 1655) Resp:  [16] 16 (08/20 1655) BP: (122)/(65) 122/65 (08/20 1655) SpO2:  [99 %] 99 % (08/20 1655) Weight:  [70.2 kg] 70.2 kg (08/21 1500)  General - well nourished, well developed, in no apparent distress. Pale   Ophthalmologic - fundi not visualized due to noncooperation.    Cardiovascular - regular rhythm and rate  Neuro - awake, alert, eyes open but lethargic, moderate  dysarthria, orientated to self but not to age or time. No aphasia but paucity of speech, following most simple commands. Able to name and repeat in dysarthric voice. No gaze palsy, left visual field deficit, not able to see hand waving, right visual field able to see hand waving only. Mild right facial droop. LUE no drift. LUE 2+/5 proximal and 0/5 distally. LLE 5/5, RLE drift to bed within 5 sec. Sensation decreased on the RLE but symmetrical BUEs, left FTN intact grossly but slow, gait not tested.   NIH Stroke Scale  Level Of Consciousness 0=Alert; keenly responsive 1=Arouse to minor stimulation 2=Requires repeated stimulation to arouse or movements to pain 3=postures or unresponsive 0  LOC Questions to Month and Age 66=Answers both questions correctly 1=Answers one question correctly or dysarthria/intubated/trauma/language barrier 2=Answers neither question correctly or aphasia 2  LOC Commands      -Open/Close eyes     -Open/close grip     -Pantomime commands if communication barrier 0=Performs both tasks correctly 1=Performs one task correctly 2=Performs neighter task correctly 0  Best Gaze     -  Only assess horizontal gaze 0=Normal 1=Partial gaze palsy 2=Forced deviation, or total gaze paresis 0  Visual 0=No visual loss 1=Partial hemianopia 2=Complete hemianopia 3=Bilateral hemianopia (blind including cortical blindness) 3  Facial Palsy     -Use grimace if obtunded 0=Normal symmetrical movement 1=Minor paralysis (asymmetry) 2=Partial paralysis (lower face) 3=Complete paralysis (upper and lower face) 1  Motor  0=No drift for 10/5 seconds 1=Drift, but does not hit bed 2=Some antigravity effort, hits  bed 3=No effort against gravity, limb falls 4=No movement 0=Amputation/joint fusion Right Arm 3     Leg 2    Left Arm 0     Leg 0  Limb Ataxia     - FNT/HTS 0=Absent or does not understand or paralyzed or amputation/joint fusion 1=Present in one limb 2=Present in two limbs 0   Sensory 0=Normal 1=Mild to moderate sensory loss 2=Severe to total sensory loss or coma/unresponsive 1  Best Language 0=No aphasia, normal 1=Mild to moderate aphasia 2=Severe aphasia 3=Mute, global aphasia, or coma/unresponsive 0  Dysarthria 0=Normal 1=Mild to moderate 2=Severe, unintelligible or mute/anarthric 0=intubated/unable to test 2  Extinction/Neglect 0=No abnormality 1=visual/tactile/auditory/spatia/personal inattention/Extinction to bilateral simultaneous stimulation 2=Profound neglect/extinction more than 1 modality  2  Total   16      Data Reviewed: MR BRAIN WO CONTRAST  Result Date: 09/04/2022 CLINICAL DATA:  62 year old female with neurologic deficit and new vision changes. Right PCA territory ischemia last month. EXAM: MRI HEAD WITHOUT CONTRAST TECHNIQUE: Multiplanar, multiecho pulse sequences of the brain and surrounding structures were obtained without intravenous contrast. COMPARISON:  Head CT 0027 hours today. Recent brain MRI 08/07/2022 and earlier. FINDINGS: Brain: Persistent diffusion abnormality in the medial right PCA territory, with a similar configuration and slightly less intense than demonstrated on 08/07/2022. Some of the diffusion there remains restricted. Mild cytotoxic edema. No malignant hemorrhagic transformation or mass effect. No restricted diffusion in any other vascular territory. Chronic infarcts with encephalomalacia in the posterior left MCA and left MCA/PCA watershed, bilateral cerebellum (severe on the right), bilateral deep gray nuclei and right brainstem. Confluent chronic blood products in the left cerebral peduncle, right cerebellar hemisphere. Fairly numerous additional chronic microhemorrhages which are concentrated in the vascular territories described above appear stable from last month. No superimposed midline shift, mass effect, evidence of mass lesion, ventriculomegaly, extra-axial collection or acute intracranial hemorrhage.  Cervicomedullary junction and pituitary are within normal limits. Vascular: Major intracranial vascular flow voids are stable. Chronically abnormal distal left vertebral artery. See CTA head and neck last month. Skull and upper cervical spine: Cervical spine motion artifact today. Bone marrow signal remains normal. Sinuses/Orbits: Stable, negative orbits and sinuses. Other: Stable mild left mastoid air cell fluid. Visible internal auditory structures appear normal. IMPRESSION: 1. Unresolved Right PCA territory ischemia since last month, but no significant progression. No malignant hemorrhagic transformation or associated mass effect. 2. Otherwise stable advanced chronic ischemic disease in the brain with multiple areas of chronic hemorrhage. 3. No new intracranial abnormality. Electronically Signed   By: Odessa Fleming M.D.   On: 09/04/2022 05:48   DG Chest Portable 1 View  Result Date: 09/04/2022 CLINICAL DATA:  MRI clearance EXAM: PORTABLE CHEST 1 VIEW COMPARISON:  None Available. FINDINGS: The heart size and mediastinal contours are within normal limits. Both lungs are clear. The visualized skeletal structures are unremarkable. No unexpected metallic foreign body within the visualized thorax. IMPRESSION: No active disease. Electronically Signed   By: Helyn Numbers M.D.   On: 09/04/2022 03:53   CT Head Wo Contrast  Result Date: 09/04/2022 CLINICAL DATA:  Acute neurologic deficit, vision change EXAM: CT HEAD WITHOUT CONTRAST TECHNIQUE: Contiguous axial images were obtained from the base of the skull through the vertex without intravenous contrast. RADIATION DOSE REDUCTION: This exam was performed according to the departmental dose-optimization program which includes automated exposure control, adjustment of the mA and/or kV according to patient size and/or use of iterative reconstruction technique. COMPARISON:  MRI 08/07/2022 FINDINGS: Brain: Evolving subacute infarct within the right parafalcine occipital lobe.  Remote cortical infarct within the left parietal cortex, lacunar infarcts within the left basal ganglia and left thalamus, and extensive encephalomalacia within the right cerebellar hemisphere which changes of right suboccipital craniotomy again noted. No acute intracranial hemorrhage or infarct. No abnormal mass effect or midline shift. Dystrophic calcifications again noted within the except ule cortices bilaterally and left cerebellar hemisphere. Ventricular size is normal. Vascular: No hyperdense vessel or unexpected calcification. Skull: Normal. Negative for fracture or focal lesion. Sinuses/Orbits: No acute finding. Other: Mastoid air cells and middle ear cavities are clear. IMPRESSION: 1. Evolving subacute infarct within the right parafalcine occipital lobe. No acute intracranial hemorrhage or infarct. No abnormal mass effect or midline shift. 2. Stable encephalomalacia within the right cerebellar hemisphere with changes of right suboccipital craniotomy. 3. Stable remote infarcts as outlined above. Electronically Signed   By: Helyn Numbers M.D.   On: 09/04/2022 01:00   CT ABDOMEN PELVIS W CONTRAST  Result Date: 08/29/2022 CLINICAL DATA:  Left lower quadrant abdominal pain, constipation EXAM: CT ABDOMEN AND PELVIS WITH CONTRAST TECHNIQUE: Multidetector CT imaging of the abdomen and pelvis was performed using the standard protocol following bolus administration of intravenous contrast. RADIATION DOSE REDUCTION: This exam was performed according to the departmental dose-optimization program which includes automated exposure control, adjustment of the mA and/or kV according to patient size and/or use of iterative reconstruction technique. CONTRAST:  OMNIPAQUE IOHEXOL 300 MG/ML  SOLN COMPARISON:  09/13/2021 FINDINGS: Lower chest: Mild scarring/atelectasis in the medial right lower lobe. Hepatobiliary: Liver is within normal limits. Gallbladder is unremarkable. No intrahepatic or extrahepatic dilatation.  Pancreas: 18 mm unilocular cyst along the posterior aspect of the pancreatic tail (series 2/image 20), unchanged. No pancreatic ductal dilatation. Spleen: Within normal limits. Adrenals/Urinary Tract: Adrenal glands are within normal limits. Two simple left renal cysts, measuring up to 2.6 cm in the lower pole (series 2/image 35), benign (Bosniak I). No follow-up is recommended. Right kidney is within normal limits. Mild left hydronephrosis. Associated punctate distal left ureteral calculus at the UVJ (series 2/image 71). Bladder is underdistended but unremarkable. Stomach/Bowel: Stomach is notable for a small hiatal hernia. No evidence of bowel obstruction. Normal appendix (series 2/image 57). No colonic wall thickening or inflammatory changes. Vascular/Lymphatic: No evidence of abdominal aortic aneurysm. Atherosclerotic calcifications of the abdominal aorta and branch vessels, although vessels remain patent. No suspicious abdominopelvic lymphadenopathy. Reproductive: Uterus is within normal limits. Bilateral ovaries are within normal limits. Other: No abdominopelvic ascites. Musculoskeletal: Visualized osseous structures are within normal limits. IMPRESSION: Punctate distal left ureteral calculus at the UVJ. Associated mild left hydronephrosis. 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 Follow-up MRI abdomen with/without contrast is suggested in 1 year. Electronically Signed   By: Charline Bills M.D.   On: 08/29/2022 18:10   DG Toe Great Right  Result Date: 08/15/2022 CLINICAL DATA:  Right great toe pain.  Bruise.  No known injury. EXAM: RIGHT GREAT TOE COMPARISON:  None Available. FINDINGS: There is  diffuse decreased bone mineralization. No acute fracture is seen. Mild hallux valgus. No significant joint space narrowing. No dislocation. IMPRESSION: 1. No acute fracture. 2. Mild hallux valgus. Electronically Signed   By: Neita Garnet M.D.   On:  08/15/2022 12:05   DG Abd 2 Views  Result Date: 08/09/2022 CLINICAL DATA:  Constipation EXAM: ABDOMEN - 2 VIEW COMPARISON:  Abdominal x-ray 06/16/2022 FINDINGS: The bowel gas pattern is normal. Stool burden is moderate. There is no evidence of free air. No radio-opaque calculi or other significant radiographic abnormality is seen. IMPRESSION: Negative. Electronically Signed   By: Darliss Cheney M.D.   On: 08/09/2022 20:53   DG Elbow 2 Views Right  Result Date: 08/08/2022 CLINICAL DATA:  Pain aggravated by exercise EXAM: RIGHT ELBOW - 2 VIEW COMPARISON:  None Available. FINDINGS: No evidence of acute fracture or dislocation. No elbow joint effusion. Demineralization. Mild degenerative changes about the elbow. IMPRESSION: No acute fracture or dislocation. Electronically Signed   By: Minerva Fester M.D.   On: 08/08/2022 18:51   MR BRAIN WO CONTRAST  Result Date: 08/07/2022 CLINICAL DATA:  Diplopia. EXAM: MRI HEAD WITHOUT CONTRAST TECHNIQUE: Multiplanar, multiecho pulse sequences of the brain and surrounding structures were obtained without intravenous contrast. COMPARISON:  MRI brain 07/28/2022. FINDINGS: Brain: Mixed acute and subacute infarcts in the medial, inferior aspect of the right occipital lobe. Foci of petechial hemorrhage within the areas of subacute infarct. No significant mass effect. Encephalomalacia with surrounding hemosiderin deposition in the right cerebellar hemisphere is unchanged. Old hemorrhage in the left thalamus. Background of moderate chronic small-vessel disease. No acute hydrocephalus or extra-axial collection. Vascular: Normal flow voids. Skull and upper cervical spine: Normal marrow signal. Sinuses/Orbits: Negative. Other: None. IMPRESSION: Mixed acute and subacute infarcts in the medial, inferior aspect of the right occipital lobe. Foci of petechial hemorrhage within the areas of subacute infarct. No significant mass effect. Electronically Signed   By: Orvan Falconer M.D.    On: 08/07/2022 19:45    Assessment: 62 y.o. 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 Symptoms gradually improving in ER, close to baseline. Found to have low BP and severe anemia. CT and CTA head and neck no bleeding or acute change. Not TNK candidate for hx of ICH and recent stroke. No IR given no LVO and mRS = 4.   Pt symptoms likely due to severe anemia and orthostatic hypotension induced recrudescence of old stroke symptoms. Recommend blood transfusion and IVF resuscitation. Will do MRI to rule out acute strokes. Recommend to hold off antiplatelet for now until hb and anemia stable.   Plan: Continue further anemia work up, recommend medicine admission Frequent neuro checks Telemetry monitoring MRI brain  PT/OT/speech consult Hold off antiplatelet for now, until hb and anemia stabilized. Discussed with Dr. Dalene Seltzer ED physician We will follow   Thank you for this consultation and allowing [place] to participate in the care of this patient.  Marvel Plan, MD PhD Stroke Neurology 09/05/2022 4:20 PM

## 2022-09-06 DIAGNOSIS — R195 Other fecal abnormalities: Secondary | ICD-10-CM | POA: Diagnosis not present

## 2022-09-06 DIAGNOSIS — D649 Anemia, unspecified: Secondary | ICD-10-CM | POA: Diagnosis not present

## 2022-09-06 DIAGNOSIS — D62 Acute posthemorrhagic anemia: Secondary | ICD-10-CM | POA: Diagnosis not present

## 2022-09-06 DIAGNOSIS — I63531 Cerebral infarction due to unspecified occlusion or stenosis of right posterior cerebral artery: Secondary | ICD-10-CM | POA: Diagnosis not present

## 2022-09-06 DIAGNOSIS — R7401 Elevation of levels of liver transaminase levels: Secondary | ICD-10-CM | POA: Diagnosis not present

## 2022-09-06 DIAGNOSIS — K922 Gastrointestinal hemorrhage, unspecified: Secondary | ICD-10-CM

## 2022-09-06 DIAGNOSIS — K862 Cyst of pancreas: Secondary | ICD-10-CM | POA: Diagnosis not present

## 2022-09-06 DIAGNOSIS — I639 Cerebral infarction, unspecified: Secondary | ICD-10-CM | POA: Insufficient documentation

## 2022-09-06 LAB — CBC
HCT: 27.9 % — ABNORMAL LOW (ref 36.0–46.0)
Hemoglobin: 9.4 g/dL — ABNORMAL LOW (ref 12.0–15.0)
MCH: 29.1 pg (ref 26.0–34.0)
MCHC: 33.7 g/dL (ref 30.0–36.0)
MCV: 86.4 fL (ref 80.0–100.0)
Platelets: 309 10*3/uL (ref 150–400)
RBC: 3.23 MIL/uL — ABNORMAL LOW (ref 3.87–5.11)
RDW: 15.8 % — ABNORMAL HIGH (ref 11.5–15.5)
WBC: 9.1 10*3/uL (ref 4.0–10.5)
nRBC: 0 % (ref 0.0–0.2)

## 2022-09-06 LAB — BASIC METABOLIC PANEL
Anion gap: 9 (ref 5–15)
BUN: 15 mg/dL (ref 6–20)
CO2: 20 mmol/L — ABNORMAL LOW (ref 22–32)
Calcium: 7.8 mg/dL — ABNORMAL LOW (ref 8.9–10.3)
Chloride: 107 mmol/L (ref 98–111)
Creatinine, Ser: 0.61 mg/dL (ref 0.44–1.00)
GFR, Estimated: >60 mL/min (ref >60)
Glucose, Bld: 96 mg/dL (ref 70–99)
Potassium: 3.2 mmol/L — ABNORMAL LOW (ref 3.5–5.1)
Sodium: 136 mmol/L (ref 135–145)

## 2022-09-06 LAB — HEMOGLOBIN A1C
Hgb A1c MFr Bld: 5.4 % (ref 4.8–5.6)
Mean Plasma Glucose: 108.28 mg/dL

## 2022-09-06 LAB — LIPID PANEL
Cholesterol: 84 mg/dL (ref 0–200)
HDL: 27 mg/dL — ABNORMAL LOW (ref >40)
LDL Cholesterol: 37 mg/dL (ref 0–99)
Total CHOL/HDL Ratio: 3.1 RATIO
Triglycerides: 101 mg/dL (ref <150)
VLDL: 20 mg/dL (ref 0–40)

## 2022-09-06 MED ORDER — POTASSIUM CHLORIDE 10 MEQ/100ML IV SOLN
10.0000 meq | INTRAVENOUS | Status: AC
  Administered 2022-09-06 (×3): 10 meq via INTRAVENOUS
  Filled 2022-09-06 (×3): qty 100

## 2022-09-06 NOTE — Consult Note (Addendum)
Consultation  Referring Provider: Dr. Baird Lyons     Primary Care Physician:  Pcp, No Primary Gastroenterologist: Gentry Fitz       Reason for Consultation: Anemia with dark stool            HPI:   Melissa Colon is a 62 y.o. 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  We are consulted now in regards to a finding of anemia with dark stool.    Patient had a right PCA stroke in 07/2022 and was put on Aspirin and Plavix for 3 months as well as Lipitor.  She was discharged to rehab, mother had worsening vision in the left eye, MRI done which showed some more acute right PCA infarcts and started on aspirin and Brilinta.  Gradual decline in hemoglobin stool Hemoccult positive.  GI was consulted and we recommended an EGD.  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  Hemoglobin remained stable after procedure and she continued on antiplatelet therapy.    08/29/2022-09/04/2022 patient seen at [facility] for kidney stone and also anemia.  Hemoglobin continued to have gradual decline.  Apparently yesterday morning early she had worsening vision, MRI showed evolution of right PCA infarct.    At time of presentation it was noted that EMS was called at around 115 on 09/05/2022 as the patient was down in the bathroom and developed worsening neurosymptoms, right side body weaker than normal and not talking.  Found to have severe anemia with a hemoglobin of 6.1.    At time of my interview with the patient she is really unable to answer a lot of questions, unaware that she was having any black stools  previously and is not sure when her last bowel movement was.  Currently her biggest complaint is that they are trying to get an IV placed and it hurts.  She denies any abdominal pain but does tell me that she is somewhat nauseous.  Does describe that she has been feeling weak and lightheaded.    Denies fever or chills.  ED course: CT abdomen pelvis with no acute findings, CTA chest negative for pulmonary embolism, CBC with worsening anemia of a hemoglobin of 6.1.  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-recommended colonoscopy if anemia persisted  Past Medical History:  Diagnosis Date   Alcohol dependence (HCC)    H/O brain tumor    Smoker     Past Surgical History:  Procedure Laterality Date   CRANIOTOMY FOR TUMOR     ESOPHAGOGASTRODUODENOSCOPY (EGD) WITH PROPOFOL N/A 08/12/2022   Procedure: ESOPHAGOGASTRODUODENOSCOPY (EGD) WITH PROPOFOL;  Surgeon: Jeani Hawking, MD;  Location: [facility] ENDOSCOPY;  Service: Gastroenterology;  Laterality: N/A;    Family History  Problem Relation Age of Onset   Diabetes Mother     Social History   Tobacco Use   Smoking status: Every Day    Types: Cigarettes   Smokeless tobacco: Never  Vaping Use   Vaping status: Never Used  Substance Use Topics   Alcohol use: Yes    Alcohol/week: 8.0 standard drinks of alcohol    Types: 8 Cans of beer per week   Drug use: Not Currently    Prior to Admission medications   Medication Sig Start Date End Date Taking? Authorizing Provider  aspirin EC 81 MG tablet Take 1 tablet (81 mg total) by mouth daily. Swallow whole. 07/29/22  Yes Leroy Sea, MD  atorvastatin (LIPITOR) 80 MG tablet Take 1 tablet (80 mg total) by mouth daily. 08/16/22  Yes Angiulli,  Mcarthur Rossetti, PA-C  diclofenac Sodium (VOLTAREN) 1 % GEL Apply 2 g topically 4 (four) times daily. 08/16/22  Yes Angiulli, Mcarthur Rossetti, PA-C  folic acid (FOLVITE) 1 MG tablet Take 1 tablet (1 mg total) by mouth daily. 08/16/22  Yes Angiulli, Mcarthur Rossetti, PA-C  Multiple Vitamin (MULTIVITAMIN WITH MINERALS) TABS tablet Take 1 tablet by mouth daily. 08/16/22  Yes Angiulli, Mcarthur Rossetti, PA-C  pantoprazole (PROTONIX) 40 MG tablet Take 1 tablet (40 mg total) by mouth daily. 08/16/22  Yes Angiulli, Mcarthur Rossetti, PA-C  polyethylene glycol (MIRALAX / GLYCOLAX) 17 g packet Take 17 g by mouth daily. 08/16/22  Yes Angiulli, Mcarthur Rossetti, PA-C  ticagrelor (BRILINTA) 90 MG TABS tablet Take 1 tablet (90 mg total) by mouth 2 (two) times daily. 08/16/22  Yes Angiulli, Mcarthur Rossetti, PA-C  meclizine (ANTIVERT) 25 MG tablet Take 1 tablet (25 mg total) by mouth 3 (three) times daily as needed for dizziness or nausea. Patient not taking: Reported on 09/06/2022 08/16/22   Angiulli, Mcarthur Rossetti, PA-C  methocarbamol (ROBAXIN) 750 MG tablet Take 1 tablet (750 mg total) by mouth every 8 (eight) hours as needed for muscle spasms. Patient not taking: Reported on 09/06/2022 08/16/22   Charlton Amor, PA-C    Current Facility-Administered Medications  Medication Dose Route Frequency Provider Last Rate Last Admin   0.9 %  sodium chloride infusion (Manually program via Guardrails IV Fluids)   Intravenous Once Alvira Monday, MD       pantoprazole (PROTONIX) injection 40 mg  40 mg Intravenous Q12H Tu, Ching T, DO       potassium chloride 10 mEq in 100 mL IVPB  10 mEq Intravenous Q1 Hr x 3 Kc, Ramesh, MD 100 mL/hr at 09/06/22 0859 10 mEq at 09/06/22 0859   Current Outpatient Medications  Medication Sig Dispense Refill   aspirin EC 81 MG tablet Take 1 tablet (81 mg total) by mouth daily. Swallow whole. 30 tablet 12   atorvastatin (LIPITOR) 80 MG tablet Take 1 tablet (80 mg total) by mouth daily. 30 tablet 0   diclofenac Sodium (VOLTAREN) 1 % GEL Apply 2 g topically 4  (four) times daily. 100 g 0   folic acid (FOLVITE) 1 MG tablet Take 1 tablet (1 mg total) by mouth daily. 30 tablet 0   Multiple Vitamin (MULTIVITAMIN WITH MINERALS) TABS tablet Take 1 tablet by mouth daily.     pantoprazole (PROTONIX) 40 MG tablet Take 1 tablet (40 mg total) by mouth daily. 30 tablet 0   polyethylene glycol (MIRALAX / GLYCOLAX) 17 g packet Take 17 g by mouth daily.     ticagrelor (BRILINTA) 90 MG TABS tablet Take 1 tablet (90 mg total) by mouth 2 (two) times daily. 60 tablet 0   meclizine (ANTIVERT) 25 MG tablet Take 1 tablet (25 mg total) by mouth 3 (three) times daily as needed for dizziness or nausea. (  Patient not taking: Reported on 09/06/2022) 30 tablet 0   methocarbamol (ROBAXIN) 750 MG tablet Take 1 tablet (750 mg total) by mouth every 8 (eight) hours as needed for muscle spasms. (Patient not taking: Reported on 09/06/2022) 60 tablet 0    Allergies as of 09/05/2022   (No Known Allergies)     Review of Systems:    Constitutional: No weight loss, fever or chills Skin: No rash Cardiovascular: No chest pain Respiratory: No SOB Gastrointestinal: See HPI and otherwise negative Genitourinary: No dysuria Neurological: +dizziness Musculoskeletal: No new muscle or joint pain Hematologic: No bruising Psychiatric: No history of depression or anxiety    Physical Exam:  Vital signs in last 24 hours: Temp:  [97.8 F (36.6 C)-99.3 F (37.4 C)] 98.7 F (37.1 C) (08/22 0803) Pulse Rate:  [77-132] 78 (08/22 0803) Resp:  [15-30] 19 (08/22 0803) BP: (96-123)/(47-88) 109/61 (08/22 0803) SpO2:  [95 %-100 %] 95 % (08/22 0803) Weight:  [70.2 kg] 70.2 kg (08/21 1500)   General:   Pleasant overweight chronically ill-appearing Caucasian female appears to be in NAD, Well developed, Well nourished, alert and cooperative + right side facial droop with dysarthria and trouble with word finding Head:  Normocephalic and atraumatic. Eyes:   PEERL, EOMI. No icterus.  Ears:  Normal  auditory acuity. Neck:  Supple Throat: Oral cavity and pharynx without inflammation, swelling or lesion. Teeth in good condition. Lungs: Respirations even and unlabored. Lungs clear to auscultation bilaterally.   No wheezes, crackles, or rhonchi.  Heart: Normal S1, S2. No MRG. Regular rate and rhythm. No peripheral edema, cyanosis or pallor.  Abdomen:  Soft, nondistended, mild epigastric TTP. No rebound or guarding. Normal bowel sounds. No appreciable masses or hepatomegaly. Rectal:  Not performed.  Msk:  Symmetrical without gross deformities. Extremities:  Without edema, no deformity or joint abnormality.  Neurologic:  Alert and  oriented x3; right-sided weakness, right upper extremity paralysis Skin:   Dry and intact without significant lesions or rashes. Psychiatric: As above with dysarthria and trouble word finding   LAB RESULTS: Recent Labs    09/05/22 1458 09/05/22 1508 09/06/22 0607  WBC 13.9*  --  9.1  HGB 6.1* 6.8* 9.4*  HCT 20.4* 20.0* 27.9*  PLT 499*  --  309   BMET Recent Labs    09/05/22 1458 09/05/22 1508 09/06/22 0607  NA 139 142 136  K 3.5 3.7 3.2*  CL 110 112* 107  CO2 18*  --  20*  GLUCOSE 121* 114* 96  BUN 23* 23* 15  CREATININE 0.79 0.70 0.61  CALCIUM 8.6*  --  7.8*   LFT Recent Labs    09/05/22 1458  PROT 6.9  ALBUMIN 3.3*  AST 18  ALT 21  ALKPHOS 121  BILITOT 0.6   PT/INR Recent Labs    09/05/22 1458  LABPROT 14.3  INR 1.1    STUDIES: MR BRAIN WO CONTRAST  Result Date: 09/05/2022 CLINICAL DATA:  Stroke follow-up EXAM: MRI HEAD WITHOUT CONTRAST TECHNIQUE: Axial and coronal diffusion-weighted sequences of the brain and surrounding structures were obtained without intravenous contrast. COMPARISON:  09/04/2022 MRI head FINDINGS: New areas of restricted diffusion with ADC correlate adjacent to the previously noted diffusion abnormality in the medial right PCA territory, with new foci slightly more inferiorly along the medial right  occipital lobe (series 5, image [id number]) and in the more lateral right occipital lobe (series 5, image 65). 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 These findings  were discussed by telephone on 09/05/2022 at 11:06 pm with provider ARORA. Electronically Signed   By: Wiliam Ke M.D.   On: 09/05/2022 23:08   CT Angio Chest PE W and/or Wo Contrast  Result Date: 09/05/2022 CLINICAL DATA:  Pulmonary embolism (PE) suspected, high prob shortness of breath. EXAM: CT ANGIOGRAPHY CHEST WITH CONTRAST TECHNIQUE: Multidetector CT imaging of the chest was performed using the standard protocol during bolus administration of intravenous contrast. Multiplanar CT image reconstructions and MIPs were obtained to evaluate the vascular anatomy. RADIATION DOSE REDUCTION: This exam was performed according to the departmental dose-optimization program which includes automated exposure control, adjustment of the mA and/or kV according to patient size and/or use of iterative reconstruction technique. CONTRAST:  65mL OMNIPAQUE IOHEXOL 350 MG/ML SOLN COMPARISON:  None Available. FINDINGS: Cardiovascular: No evidence of embolism to the proximal subsegmental pulmonary artery level. Normal cardiac size. No pericardial effusion. No aortic aneurysm. There are coronary artery calcifications, in keeping with coronary artery disease. There are also moderate to severe peripheral atherosclerotic vascular calcifications of thoracic aorta and its major branches. Mediastinum/Nodes: Asymmetrically enlarged and heterogeneous right thyroid lobe without discrete nodule is not well evaluated on the current exam. 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 There is short segment marked narrowing versus complete occlusion at the origin of the left vertebral artery, not well evaluated on the current exam. No solid / cystic mediastinal masses. The esophagus is nondistended precluding optimal assessment. No axillary,  mediastinal or hilar lymphadenopathy by size criteria. Lungs/Pleura: The central tracheo-bronchial tree is patent. There are patchy areas of linear, plate-like atelectasis and/or scarring throughout bilateral lungs. No mass or consolidation. No pleural effusion or pneumothorax. No suspicious lung nodules. Upper Abdomen: There is a partially exophytic 2.0 x 2.4 cm cyst arising from the left kidney upper pole, posteriorly. There is a 1.6 x 1.8 cm hypoattenuating structure in the posterior aspect of the pancreatic body, not well evaluated on the current exam but unchanged since the prior study dating back to 09/13/2021. Remaining visualized upper abdominal viscera within normal limits. Please refer to simultaneously acquired but separately dictated CT scan abdomen and pelvis report for additional details. Musculoskeletal: The visualized soft tissues of the chest wall are grossly unremarkable. No suspicious osseous lesions. There are mild multilevel degenerative changes in the visualized spine. Review of the MIP images confirms the above findings. IMPRESSION: 1. No evidence of pulmonary embolism. No acute intrathoracic pathology identified. 2. Short segment marked narrowing versus complete occlusion at the origin of the hypoplastic left vertebral artery, not well evaluated on the current exam. 3. Multiple other nonacute observations, as described above. Aortic Atherosclerosis (ICD10-I70.0). Electronically Signed   By: Jules Schick M.D.   On: 09/05/2022 18:41   CT ABDOMEN PELVIS W CONTRAST  Result Date: 09/05/2022 CLINICAL DATA:  Acute abdominal pain EXAM: CT ABDOMEN AND PELVIS WITH CONTRAST TECHNIQUE: Multidetector CT imaging of the abdomen and pelvis was performed using the standard protocol following bolus administration of intravenous contrast. RADIATION DOSE REDUCTION: This exam was performed according to the departmental dose-optimization program which includes automated exposure control, adjustment of the mA  and/or kV according to patient size and/or use of iterative reconstruction technique. CONTRAST:  65mL OMNIPAQUE IOHEXOL 350 MG/ML SOLN COMPARISON:  CT abdomen and pelvis 08/29/2022 FINDINGS: Lower chest: No acute abnormality. Hepatobiliary: No focal liver abnormality is seen. No gallstones, gallbladder wall thickening, or biliary dilatation. Pancreas: Cystic pancreatic lesion in the body of the pancreas measures 14 mm and appears unchanged.  The pancreas is otherwise within normal limits. Spleen: Normal in size without focal abnormality. Adrenals/Urinary Tract: There is contrast in the bladder and renal collecting systems which limits evaluation for small calculi. There is no hydronephrosis or perinephric fat stranding. There is a cyst in the superior pole of the left kidney measuring 2.1 cm. The adrenal glands and bladder are within normal limits. Stomach/Bowel: There is a small hiatal hernia. Stomach is otherwise within normal limits. Appendix appears normal. No evidence of bowel wall thickening, distention, or inflammatory changes. Vascular/Lymphatic: Aortic atherosclerosis. No enlarged abdominal or pelvic lymph nodes. Reproductive: Uterus and bilateral adnexa are unremarkable. Other: No abdominal wall hernia or abnormality. No abdominopelvic ascites. Musculoskeletal: No acute or significant osseous findings. IMPRESSION: 1. No acute localizing process in the abdomen or pelvis. 2. Small hiatal hernia. 3. Stable cystic lesion in the body of the pancreas. Recommend follow-up MRI in 2 years to confirm stability. 4. Left Bosniak I benign renal cyst measuring 2.1 cm. No follow-up imaging is recommended. JACR 2018 Feb; 264-273, Management of the Incidental Renal Mass on CT, [employer] 2021; 814-848, Bosniak Classification of Cystic Renal Masses, Version 2019. Aortic Atherosclerosis (ICD10-I70.0). Electronically Signed   By: Darliss Cheney M.D.   On: 09/05/2022 18:31   CT ANGIO HEAD NECK W WO CM W PERF (CODE  STROKE)  Result Date: 09/05/2022 CLINICAL DATA:  Acute stroke suspected EXAM: CT ANGIOGRAPHY HEAD AND NECK CT PERFUSION BRAIN TECHNIQUE: Multidetector CT imaging of the head and neck was performed using the standard protocol during bolus administration of intravenous contrast. Multiplanar CT image reconstructions and MIPs were obtained to evaluate the vascular anatomy. Carotid stenosis measurements (when applicable) are obtained utilizing NASCET criteria, using the distal internal carotid diameter as the denominator. Multiphase CT imaging of the brain was performed following IV bolus contrast injection. Subsequent parametric perfusion maps were calculated using RAPID software. RADIATION DOSE REDUCTION: This exam was performed according to the departmental dose-optimization program which includes automated exposure control, adjustment of the mA and/or kV according to patient size and/or use of iterative reconstruction technique. CONTRAST:  OMNIPAQUE IOHEXOL 350 MG/ML SOLN COMPARISON:  07/27/2022 FINDINGS: CTA NECK FINDINGS Aortic arch: Atheromatous wall thickening. Three vessel branching. No acute finding Right carotid system: Mild atheromatous plaque at the bifurcation without significant stenosis. No ulceration or beading Left carotid system: No significant stenosis, ulceration, or beading Vertebral arteries: No proximal subclavian stenosis. The right vertebral artery is dominant. Severe stenosis with non visible lumen at the left vertebral origin. The left vertebral artery is diffusely attenuated appearing relative to the transverse foramen size. Skeleton: No acute finding.  Occipital craniectomy. Other neck: No acute finding. Upper chest: Mildly focal emphysema.  No acute finding Review of the MIP images confirms the above findings CTA HEAD FINDINGS Anterior circulation: Atheromatous calcification along the carotid siphons. No major branch occlusion, beading, or flow reducing stenosis. Widespread  intracranial atheromatous irregularity with high-grade branch narrowing seen diffusely, affecting the ACA and MCA circulation bilaterally. Difficult arterial visualization of the left MCA bifurcation due to closely neighboring crossing vein. No aneurysm or vascular malformation Posterior circulation: Atheromatous irregularity of the right vertebral and basilar arteries without flow reducing stenosis. Tiny left vertebral artery with limited contribution to the posterior circulation. Extensive atheromatous type irregularity of the posterior cerebral arteries with flow gap at the right P2 segment and high-grade narrowings at the left P2 and P3. Venous sinuses: Unremarkable for arterial timing Anatomic variants: None significant Review of the MIP images confirms the above  findings CT Brain Perfusion Findings: CBF (<30%) Volume: 3mL Perfusion (Tmax>6.0s) volume: 13mL Mismatch Volume: 10mL Infarction Location:Right parasagittal parietal lobe, site of restricted diffusion on preceding CT with core infarct underestimated compared to MRI. IMPRESSION: 1. No emergent finding. 2. Advanced intracranial branch atherosclerosis similar to priors. Especially advanced bilateral PCA narrowing with flow gap at the right P2 segment 3. Thready flow throughout the left vertebral artery with severe stenosis or occlusion at the origin. 4. CT perfusion deficit matching brain MRI from yesterday. Electronically Signed   By: Tiburcio Pea M.D.   On: 09/05/2022 15:39   CT HEAD CODE STROKE WO CONTRAST  Result Date: 09/05/2022 CLINICAL DATA:  Code stroke.  Right-sided weakness EXAM: CT HEAD WITHOUT CONTRAST TECHNIQUE: Contiguous axial images were obtained from the base of the skull through the vertex without intravenous contrast. RADIATION DOSE REDUCTION: This exam was performed according to the departmental dose-optimization program which includes automated exposure control, adjustment of the mA and/or kV according to patient size and/or  use of iterative reconstruction technique. COMPARISON:  CT Head 09/04/22, MR Head 09/04/22 FINDINGS: Brain: No hemorrhage. No hydrocephalus. No extra-axial fluid collection. Redemonstrated right PCA territory infarct with chronic calcifications along the medial aspect of the bilateral occipital lobes. 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 Unchanged chronic calcification in the left cerebellar hemisphere. No CT evidence of an acute cortical infarct. Unchanged chronic infarct in the left parietal lobe. Unchanged size and shape of the ventricular system. Vascular: No hyperdense vessel or unexpected calcification. Skull: Prior suboccipital craniotomy. Sinuses/Orbits: No middle ear or mastoid effusion. Paranasal sinuses clear. Orbits are unremarkable. Other: None. ASPECTS [facility] [place] Stroke Program Early CT Score): 10 when accounting for chronic findings. IMPRESSION: 1. No hemorrhage or CT evidence of an acute cortical infarct. 2. Redemonstrated right PCA territory infarct with chronic calcifications along the medial aspect of the bilateral occipital lobes. 3. 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 Findings were paged to Dr. Roda Shutters on 09/05/22 at 3:19 PM via [facility] paging system. Electronically Signed   By: Lorenza Cambridge M.D.   On: 09/05/2022 15:23      Impression / Plan:   Impression: 1.  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  History of right ischemic stroke in July: on Aspirin and Brilinta-currently on hold given active GI  bleed- last dose 8/21 3.  Pancreatic cyst: Being followed with MRI in 2 years 4.  Aphasia: Apparently improved in the ED, MRI brain was pending  Plan: 1.  Plans for eventual enteroscopy +/- colonoscopy.  Timing per Dr. Myrtie Neither.  Did discuss this with the patient today and she is agreeable. 2.  Continue to monitor hemoglobin and transfusion as needed less than 7 3.  Agree with Pantoprazole 40 mg IV twice daily 4.  Patient to remain n.p.o. for now.  If no procedure today could have clears. 5.  Agree with holding Brilinta  Thank you for your kind consultation, we will continue to follow.  Violet Baldy Lemmon  09/06/2022, 9:01 AM  Addendum: 1:40 pm  Attempted to reach family but there was no answer.  JLL  _______________________  I have taken an interval history, thoroughly reviewed the chart and examined the patient. I agree with the Advanced Practitioner's note, impression and recommendations, and have recorded additional findings, impressions and recommendations below. I performed a substantive portion of this encounter (>50% time spent), including a complete performance of the medical decision making.  My additional thoughts are as follows:  Slow ongoing GI blood loss of an obscure source in a patient with severe cerebrovascular disease, recent CVA on antiplatelet therapy. Nonbleeding AVMs found on recent inpatient EGD by Dr. Elnoria Howard, and this strongly suggest there may be more distal AVMs as well.  The colonic sources possible with reported melena. She needs further endoscopic testing if her family wishes to be aggressive with care, but she is clearly in poor condition at high risk for endoscopy related complications. She needs a small bowel enteroscopy and a colonoscopy, though I am not yet sure if we will perform both at the same endoscopic session.  I believe the bowel preparation for colonoscopy would likely be quite challenging for her due to her condition and dysphagia.   I would  like some more time for the Brilinta to get out of her system so she can have safer endoscopic management of any bleeding sources that are found (AVM or otherwise). She has been transfused 2 units of PRBCs with an appropriate rise in hemoglobin, and thus far she is not demonstrating any signs of brisk GI bleeding.  She can have her regular dysphagia diet when felt to be appropriate by the medical team and speech and swallow therapist if appropriate.  We will continue to follow, please hold her Brilinta.   Charlie Pitter III Office:[phone]

## 2022-09-06 NOTE — ED Notes (Signed)
ED TO INPATIENT HANDOFF REPORT  ED Nurse Name and Phone #: Dot Lanes Paramedic [phone]  S Name/Age/Gender Melissa Colon 62 y.o. female Room/Bed: 034C/034C  Code Status   Code Status: Full Code  Home/SNF/Other Home Patient oriented to: self and place Is this baseline? Yes   Triage Complete: Triage complete  Chief Complaint Acute blood loss anemia [D62]  Triage Note PT BIB EMS from [facility] for stroke like symptoms.  Last known well 1315 today.   Allergies No Known Allergies  Level of Care/Admitting Diagnosis ED Disposition     ED Disposition  Admit   Condition  --   Comment  Hospital Area: [facility] [[id number]]  Level of Care: Progressive [102]  Admit to Progressive based on following criteria: GI, ENDOCRINE disease patients with GI bleeding, acute liver failure or pancreatitis, stable with diabetic ketoacidosis or thyrotoxicosis (hypothyroid) state.  May admit patient to [facility] or [facility] if equivalent level of care is available:: No  Covid Evaluation: Asymptomatic - no recent exposure (last 10 days) testing not required  Diagnosis: Acute blood loss anemia [478295]  Admitting Physician: Anselm Jungling [[id number]]  Attending Physician: Anselm Jungling [[id number]]  Certification:: I certify this patient will need inpatient services for at least 2 midnights  Expected Medical Readiness: 09/07/2022          B Medical/Surgery History Past Medical History:  Diagnosis Date   Alcohol dependence (HCC)    H/O brain tumor    Smoker    Past Surgical History:  Procedure Laterality Date   CRANIOTOMY FOR TUMOR     ESOPHAGOGASTRODUODENOSCOPY (EGD) WITH PROPOFOL N/A 08/12/2022   Procedure: ESOPHAGOGASTRODUODENOSCOPY (EGD) WITH PROPOFOL;  Surgeon: Jeani Hawking, MD;  Location: [facility] ENDOSCOPY;  Service: Gastroenterology;  Laterality: N/A;     A IV Location/Drains/Wounds Patient Lines/Drains/Airways Status     Active Line/Drains/Airways     Name  Placement date Placement time Site Days   Peripheral IV 09/06/22 22 G 1.75" Left;Anterior Forearm 09/06/22  1050  Forearm  less than 1   External Urinary Catheter 09/06/22  0600  --  less than 1            Intake/Output Last 24 hours  Intake/Output Summary (Last 24 hours) at 09/06/2022 1459 Last data filed at 09/06/2022 1103 Gross per 24 hour  Intake 1955.98 ml  Output 550 ml  Net 1405.98 ml    Labs/Imaging Results for orders placed or performed during the hospital encounter of 09/05/22 (from the past 48 hour(s))  Ethanol     Status: None   Collection Time: 09/05/22  2:58 PM  Result Value Ref Range   Alcohol, Ethyl (B) <10 <10 mg/dL    Comment: (NOTE) Lowest detectable limit for serum alcohol is 10 mg/dL.  For medical purposes only. Performed at [facility], [address] [place], [place] [address]   Protime-INR     Status: None   Collection Time: 09/05/22  2:58 PM  Result Value Ref Range   Prothrombin Time 14.3 11.4 - 15.2 seconds   INR 1.1 0.8 - 1.2    Comment: (NOTE) INR goal varies based on device and disease states. Performed at [facility], [address] [place], [place] [address]   APTT     Status: None   Collection Time: 09/05/22  2:58 PM  Result Value Ref Range   aPTT 26 24 - 36 seconds    Comment: Performed at [facility], [address], [place], [place] [address]  CBC     Status: Abnormal   Collection Time: 09/05/22  2:58 PM  Result Value Ref Range   WBC 13.9 (H) 4.0 - 10.5 K/uL   RBC 2.41 (L) 3.87 - 5.11 MIL/uL   Hemoglobin 6.1 (LL) 12.0 - 15.0 g/dL    Comment: REPEATED TO VERIFY THIS CRITICAL RESULT HAS VERIFIED AND BEEN CALLED TO Judie Petit GROSS, RN BY LEAH KLAR ON 08 21 2024 AT 1523, AND HAS BEEN READ BACK.     HCT 20.4 (L) 36.0 - 46.0 %   MCV 84.6 80.0 - 100.0 fL   MCH 25.3 (L) 26.0 - 34.0 pg   MCHC 29.9 (L) 30.0 - 36.0 g/dL   RDW 60.4 (H) 54.0 - 98.1 %   Platelets 499 (H) 150 - 400 K/uL    Comment: REPEATED TO VERIFY    nRBC 0.0 0.0 - 0.2 %    Comment: Performed at [facility], [address], [place], [place] [address]  Differential     Status: Abnormal   Collection Time: 09/05/22  2:58 PM  Result Value Ref Range   Neutrophils Relative % 83 %   Neutro Abs 11.4 (H) 1.7 - 7.7 K/uL   Lymphocytes Relative 11 %   Lymphs Abs 1.6 0.7 - 4.0 K/uL   Monocytes Relative 5 %   Monocytes Absolute 0.7 0.1 - 1.0 K/uL   Eosinophils Relative 0 %   Eosinophils Absolute 0.0 0.0 - 0.5 K/uL   Basophils Relative 0 %   Basophils Absolute 0.1 0.0 - 0.1 K/uL   Immature Granulocytes 1 %   Abs Immature Granulocytes 0.07 0.00 - 0.07 K/uL    Comment: Performed at [facility], [address], [place], [place] [address]  Comprehensive metabolic panel     Status: Abnormal   Collection Time: 09/05/22  2:58 PM  Result Value Ref Range   Sodium 139 135 - 145 mmol/L   Potassium 3.5 3.5 - 5.1 mmol/L   Chloride 110 98 - 111 mmol/L   CO2 18 (L) 22 - 32 mmol/L   Glucose, Bld 121 (H) 70 - 99 mg/dL    Comment: Glucose reference range applies only to samples taken after fasting for at least 8 hours.   BUN 23 (H) 6 - 20 mg/dL   Creatinine, Ser 2.13 0.44 - 1.00 mg/dL   Calcium 8.6 (L) 8.9 - 10.3 mg/dL   Total Protein 6.9 6.5 - 8.1 g/dL   Albumin 3.3 (L) 3.5 - 5.0 g/dL   AST 18 15 - 41 U/L   ALT 21 0 - 44 U/L   Alkaline Phosphatase 121 38 - 126 U/L   Total Bilirubin 0.6 0.3 - 1.2 mg/dL   GFR, Estimated >08 >65 mL/min    Comment: (NOTE) Calculated using the CKD-EPI Creatinine Equation (2021)    Anion gap 11 5 - 15    Comment: Performed at [facility] Lab, [address], [place], [place] [address]  CBG monitoring, ED     Status: Abnormal   Collection Time: 09/05/22  2:58 PM  Result Value Ref Range   Glucose-Capillary 106 (H) 70 - 99 mg/dL    Comment: Glucose reference range applies only to samples taken after fasting for at least 8 hours.  hCG, serum, qualitative     Status: None   Collection Time: 09/05/22  2:58  PM  Result Value Ref Range   Preg, Serum NEGATIVE NEGATIVE    Comment:  THE SENSITIVITY OF THIS METHODOLOGY IS >10 mIU/mL. Performed at [facility], [address] [place], [place] [address]   Folate     Status: None   Collection Time: 09/05/22  2:58 PM  Result Value Ref Range   Folate 34.3 >5.9 ng/mL    Comment: Performed at [facility] Lab, [address] [place], [place] [address]  Iron and TIBC     Status: Abnormal   Collection Time: 09/05/22  2:58 PM  Result Value Ref Range   Iron 20 (L) 28 - 170 ug/dL   TIBC 098 119 - 147 ug/dL   Saturation Ratios 5 (L) 10.4 - 31.8 %   UIBC 378 ug/dL    Comment: Performed at [facility], [address] [place], [place] [address]  Ferritin     Status: Abnormal   Collection Time: 09/05/22  2:58 PM  Result Value Ref Range   Ferritin 6 (L) 11 - 307 ng/mL    Comment: Performed at [facility], [address] [place], [place] [address]  Reticulocytes     Status: Abnormal   Collection Time: 09/05/22  2:58 PM  Result Value Ref Range   Retic Ct Pct 5.1 (H) 0.4 - 3.1 %   RBC. 2.44 (L) 3.87 - 5.11 MIL/uL   Retic Count, Absolute 124.4 19.0 - 186.0 K/uL   Immature Retic Fract 37.4 (H) 2.3 - 15.9 %    Comment: Performed at [facility] Lab, [address], [place], [place] [address]  Troponin I (High Sensitivity)     Status: None   Collection Time: 09/05/22  2:58 PM  Result Value Ref Range   Troponin I (High Sensitivity) 12 <18 ng/L    Comment: (NOTE) Elevated high sensitivity troponin I (hsTnI) values and significant  changes across serial measurements may suggest ACS but many other  chronic and acute conditions are known to elevate hsTnI results.  Refer to the "Links" section for chest pain algorithms and additional  guidance. Performed at [facility], [address] [place], [place] [address]   I-stat chem 8, ED     Status: Abnormal   Collection Time: 09/05/22  3:08 PM  Result Value Ref Range    Sodium 142 135 - 145 mmol/L   Potassium 3.7 3.5 - 5.1 mmol/L   Chloride 112 (H) 98 - 111 mmol/L   BUN 23 (H) 6 - 20 mg/dL   Creatinine, Ser 5.28 0.44 - 1.00 mg/dL   Glucose, Bld 413 (H) 70 - 99 mg/dL    Comment: Glucose reference range applies only to samples taken after fasting for at least 8 hours.   Calcium, Ion 1.11 (L) 1.15 - 1.40 mmol/L   TCO2 17 (L) 22 - 32 mmol/L   Hemoglobin 6.8 (LL) 12.0 - 15.0 g/dL   HCT 24.4 (L) 01.0 - 27.2 %   Comment NOTIFIED PHYSICIAN   Prepare RBC (crossmatch)     Status: None   Collection Time: 09/05/22  3:42 PM  Result Value Ref Range   Order Confirmation      ORDER PROCESSED BY BLOOD BANK Performed at [facility], [address], [place], [place] [address]   Type and screen [facility]     Status: None (Preliminary result)   Collection Time: 09/05/22  4:03 PM  Result Value Ref Range   ABO/RH(D) B POS    Antibody Screen NEG    Sample Expiration 09/08/2022,2359  Unit Number [id number]    Blood Component Type RED CELLS,LR    Unit division 00    Status of Unit ISSUED,FINAL    Transfusion Status OK TO TRANSFUSE    Crossmatch Result      Compatible Performed at [facility], [address], [place], [place] [address]    Unit Number [id number]    Blood Component Type RED CELLS,LR    Unit division 00    Status of Unit ISSUED    Transfusion Status OK TO TRANSFUSE    Crossmatch Result Compatible   ABO/Rh     Status: None   Collection Time: 09/05/22  4:04 PM  Result Value Ref Range   ABO/RH(D)      B POS Performed at [facility], [address], [place], [place] [address]   Vitamin B12     Status: None   Collection Time: 09/05/22  5:00 PM  Result Value Ref Range   Vitamin B-12 214 180 - 914 pg/mL    Comment: (NOTE) This assay is not validated for testing neonatal or myeloproliferative syndrome specimens for Vitamin B12 levels. Performed at [facility], [address] [place],  [place] [address]   Brain natriuretic peptide     Status: None   Collection Time: 09/05/22  5:22 PM  Result Value Ref Range   B Natriuretic Peptide 53.4 0.0 - 100.0 pg/mL    Comment: Performed at [facility], [address] [place], [place] [address]  Prepare RBC (crossmatch)     Status: None   Collection Time: 09/05/22  7:51 PM  Result Value Ref Range   Order Confirmation      ORDER PROCESSED BY BLOOD BANK Performed at [facility], [address], [place], [place] [address]   Troponin I (High Sensitivity)     Status: None   Collection Time: 09/05/22  7:51 PM  Result Value Ref Range   Troponin I (High Sensitivity) 16 <18 ng/L    Comment: (NOTE) Elevated high sensitivity troponin I (hsTnI) values and significant  changes across serial measurements may suggest ACS but many other  chronic and acute conditions are known to elevate hsTnI results.  Refer to the "Links" section for chest pain algorithms and additional  guidance. Performed at [facility], [address] [place], [place] [address]   Urine rapid drug screen (hosp performed)     Status: None   Collection Time: 09/05/22  7:53 PM  Result Value Ref Range   Opiates NONE DETECTED NONE DETECTED   Cocaine NONE DETECTED NONE DETECTED   Benzodiazepines NONE DETECTED NONE DETECTED   Amphetamines NONE DETECTED NONE DETECTED   Tetrahydrocannabinol NONE DETECTED NONE DETECTED   Barbiturates NONE DETECTED NONE DETECTED    Comment: (NOTE) DRUG SCREEN FOR MEDICAL PURPOSES ONLY.  IF CONFIRMATION IS NEEDED FOR ANY PURPOSE, NOTIFY LAB WITHIN 5 DAYS.  LOWEST DETECTABLE LIMITS FOR URINE DRUG SCREEN Drug Class                     Cutoff (ng/mL) Amphetamine and metabolites    1000 Barbiturate and metabolites    200 Benzodiazepine                 200 Opiates and metabolites        300 Cocaine and metabolites        300 THC  50 Performed at [facility] Lab, [address] [place],  [place] [address]   Urinalysis, Routine w reflex microscopic -Urine, Clean Catch     Status: Abnormal   Collection Time: 09/05/22  7:54 PM  Result Value Ref Range   Color, Urine STRAW (A) YELLOW   APPearance CLEAR CLEAR   Specific Gravity, Urine >1.046 (H) 1.005 - 1.030   pH 5.0 5.0 - 8.0   Glucose, UA NEGATIVE NEGATIVE mg/dL   Hgb urine dipstick NEGATIVE NEGATIVE   Bilirubin Urine NEGATIVE NEGATIVE   Ketones, ur 5 (A) NEGATIVE mg/dL   Protein, ur NEGATIVE NEGATIVE mg/dL   Nitrite NEGATIVE NEGATIVE   Leukocytes,Ua NEGATIVE NEGATIVE    Comment: Performed at [facility] Lab, [address] [place], [place] [address]  CBG monitoring, ED     Status: None   Collection Time: 09/05/22  9:15 PM  Result Value Ref Range   Glucose-Capillary 96 70 - 99 mg/dL    Comment: Glucose reference range applies only to samples taken after fasting for at least 8 hours.  Lipid panel     Status: Abnormal   Collection Time: 09/06/22  6:05 AM  Result Value Ref Range   Cholesterol 84 0 - 200 mg/dL   Triglycerides 829 <562 mg/dL   HDL 27 (L) >13 mg/dL   Total CHOL/HDL Ratio 3.1 RATIO   VLDL 20 0 - 40 mg/dL   LDL Cholesterol 37 0 - 99 mg/dL    Comment:        Total Cholesterol/HDL:CHD Risk Coronary Heart Disease Risk Table                     Men   Women  1/2 Average Risk   3.4   3.3  Average Risk       5.0   4.4  2 X Average Risk   9.6   7.1  3 X Average Risk  23.4   11.0        Use the calculated Patient Ratio above and the CHD Risk Table to determine the patient's CHD Risk.        ATP III CLASSIFICATION (LDL):  <100     mg/dL   Optimal  086-578  mg/dL   Near or Above                    Optimal  130-159  mg/dL   Borderline  469-629  mg/dL   High  >528     mg/dL   Very High Performed at [facility], [address] [place], [place] [address]   Hemoglobin A1c     Status: None   Collection Time: 09/06/22  6:05 AM  Result Value Ref Range   Hgb A1c MFr Bld 5.4 4.8 - 5.6 %    Comment:  (NOTE) Pre diabetes:          5.7%-6.4%  Diabetes:              >6.4%  Glycemic control for   <7.0% adults with diabetes    Mean Plasma Glucose 108.28 mg/dL    Comment: Performed at [facility], [address], [place], [place] [address]  CBC     Status: Abnormal   Collection Time: 09/06/22  6:07 AM  Result Value Ref Range   WBC 9.1 4.0 - 10.5 K/uL   RBC 3.23 (L) 3.87 - 5.11 MIL/uL   Hemoglobin 9.4 (L) 12.0 - 15.0  g/dL    Comment: REPEATED TO VERIFY POST TRANSFUSION SPECIMEN    HCT 27.9 (L) 36.0 - 46.0 %   MCV 86.4 80.0 - 100.0 fL   MCH 29.1 26.0 - 34.0 pg   MCHC 33.7 30.0 - 36.0 g/dL   RDW 40.9 (H) 81.1 - 91.4 %   Platelets 309 150 - 400 K/uL   nRBC 0.0 0.0 - 0.2 %    Comment: Performed at [facility], [address], [place], [place] [address]  Basic metabolic panel     Status: Abnormal   Collection Time: 09/06/22  6:07 AM  Result Value Ref Range   Sodium 136 135 - 145 mmol/L   Potassium 3.2 (L) 3.5 - 5.1 mmol/L   Chloride 107 98 - 111 mmol/L   CO2 20 (L) 22 - 32 mmol/L   Glucose, Bld 96 70 - 99 mg/dL    Comment: Glucose reference range applies only to samples taken after fasting for at least 8 hours.   BUN 15 6 - 20 mg/dL   Creatinine, Ser 6.21 0.44 - 1.00 mg/dL   Calcium 7.8 (L) 8.9 - 10.3 mg/dL   GFR, Estimated >30 >86 mL/min    Comment: (NOTE) Calculated using the CKD-EPI Creatinine Equation (2021)    Anion gap 9 5 - 15    Comment: Performed at [facility] Lab, [address], [place], [place] [address]   MR BRAIN WO CONTRAST  Result Date: 09/05/2022 CLINICAL DATA:  Stroke follow-up EXAM: MRI HEAD WITHOUT CONTRAST TECHNIQUE: Axial and coronal diffusion-weighted sequences of the brain and surrounding structures were obtained without intravenous contrast. COMPARISON:  09/04/2022 MRI head FINDINGS: New areas of restricted diffusion with ADC correlate adjacent to the previously noted diffusion abnormality in the medial right PCA territory, with new  foci slightly more inferiorly along the medial right occipital lobe (series 5, image [id number]) and in the more lateral right occipital lobe (series 5, image 65). 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 These findings were discussed by telephone on 09/05/2022 at 11:06 pm with provider ARORA. Electronically Signed   By: Wiliam Ke M.D.   On: 09/05/2022 23:08   CT Angio Chest PE W and/or Wo Contrast  Result Date: 09/05/2022 CLINICAL DATA:  Pulmonary embolism (PE) suspected, high prob shortness of breath. EXAM: CT ANGIOGRAPHY CHEST WITH CONTRAST TECHNIQUE: Multidetector CT imaging of the chest was performed using the standard protocol during bolus administration of intravenous contrast. Multiplanar CT image reconstructions and MIPs were obtained to evaluate the vascular anatomy. RADIATION DOSE REDUCTION: This exam was performed according to the departmental dose-optimization program which includes automated exposure control, adjustment of the mA and/or kV according to patient size and/or use of iterative reconstruction technique. CONTRAST:  65mL OMNIPAQUE IOHEXOL 350 MG/ML SOLN COMPARISON:  None Available. FINDINGS: Cardiovascular: No evidence of embolism to the proximal subsegmental pulmonary artery level. Normal cardiac size. No pericardial effusion. No aortic aneurysm. There are coronary artery calcifications, in keeping with coronary artery disease. There are also moderate to severe peripheral atherosclerotic vascular calcifications of thoracic aorta and its major branches. Mediastinum/Nodes: Asymmetrically enlarged and heterogeneous right thyroid lobe without discrete nodule is not well evaluated on the current exam. 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 There is short segment marked narrowing versus complete occlusion at the origin of the left vertebral artery, not well evaluated on the current exam. No solid / cystic mediastinal masses. The esophagus is  nondistended precluding optimal  assessment. No axillary, mediastinal or hilar lymphadenopathy by size criteria. Lungs/Pleura: The central tracheo-bronchial tree is patent. There are patchy areas of linear, plate-like atelectasis and/or scarring throughout bilateral lungs. No mass or consolidation. No pleural effusion or pneumothorax. No suspicious lung nodules. Upper Abdomen: There is a partially exophytic 2.0 x 2.4 cm cyst arising from the left kidney upper pole, posteriorly. There is a 1.6 x 1.8 cm hypoattenuating structure in the posterior aspect of the pancreatic body, not well evaluated on the current exam but unchanged since the prior study dating back to 09/13/2021. Remaining visualized upper abdominal viscera within normal limits. Please refer to simultaneously acquired but separately dictated CT scan abdomen and pelvis report for additional details. Musculoskeletal: The visualized soft tissues of the chest wall are grossly unremarkable. No suspicious osseous lesions. There are mild multilevel degenerative changes in the visualized spine. Review of the MIP images confirms the above findings. IMPRESSION: 1. No evidence of pulmonary embolism. No acute intrathoracic pathology identified. 2. Short segment marked narrowing versus complete occlusion at the origin of the hypoplastic left vertebral artery, not well evaluated on the current exam. 3. Multiple other nonacute observations, as described above. Aortic Atherosclerosis (ICD10-I70.0). Electronically Signed   By: Jules Schick M.D.   On: 09/05/2022 18:41   CT ABDOMEN PELVIS W CONTRAST  Result Date: 09/05/2022 CLINICAL DATA:  Acute abdominal pain EXAM: CT ABDOMEN AND PELVIS WITH CONTRAST TECHNIQUE: Multidetector CT imaging of the abdomen and pelvis was performed using the standard protocol following bolus administration of intravenous contrast. RADIATION DOSE REDUCTION: This exam was performed according to the departmental dose-optimization program which  includes automated exposure control, adjustment of the mA and/or kV according to patient size and/or use of iterative reconstruction technique. CONTRAST:  65mL OMNIPAQUE IOHEXOL 350 MG/ML SOLN COMPARISON:  CT abdomen and pelvis 08/29/2022 FINDINGS: Lower chest: No acute abnormality. Hepatobiliary: No focal liver abnormality is seen. No gallstones, gallbladder wall thickening, or biliary dilatation. Pancreas: Cystic pancreatic lesion in the body of the pancreas measures 14 mm and appears unchanged. The pancreas is otherwise within normal limits. Spleen: Normal in size without focal abnormality. Adrenals/Urinary Tract: There is contrast in the bladder and renal collecting systems which limits evaluation for small calculi. There is no hydronephrosis or perinephric fat stranding. There is a cyst in the superior pole of the left kidney measuring 2.1 cm. The adrenal glands and bladder are within normal limits. Stomach/Bowel: There is a small hiatal hernia. Stomach is otherwise within normal limits. Appendix appears normal. No evidence of bowel wall thickening, distention, or inflammatory changes. Vascular/Lymphatic: Aortic atherosclerosis. No enlarged abdominal or pelvic lymph nodes. Reproductive: Uterus and bilateral adnexa are unremarkable. Other: No abdominal wall hernia or abnormality. No abdominopelvic ascites. Musculoskeletal: No acute or significant osseous findings. IMPRESSION: 1. No acute localizing process in the abdomen or pelvis. 2. Small hiatal hernia. 3. Stable cystic lesion in the body of the pancreas. Recommend follow-up MRI in 2 years to confirm stability. 4. Left Bosniak I benign renal cyst measuring 2.1 cm. No follow-up imaging is recommended. JACR 2018 Feb; 264-273, Management of the Incidental Renal Mass on CT, [employer] 2021; [phone], Bosniak Classification of Cystic Renal Masses, Version 2019. Aortic Atherosclerosis (ICD10-I70.0). Electronically Signed   By: Darliss Cheney M.D.   On: 09/05/2022  18:31   CT ANGIO HEAD NECK W WO CM W PERF (CODE STROKE)  Result Date: 09/05/2022 CLINICAL DATA:  Acute stroke suspected EXAM: CT ANGIOGRAPHY HEAD AND NECK CT PERFUSION BRAIN TECHNIQUE: Multidetector CT imaging  of the head and neck was performed using the standard protocol during bolus administration of intravenous contrast. Multiplanar CT image reconstructions and MIPs were obtained to evaluate the vascular anatomy. Carotid stenosis measurements (when applicable) are obtained utilizing NASCET criteria, using the distal internal carotid diameter as the denominator. Multiphase CT imaging of the brain was performed following IV bolus contrast injection. Subsequent parametric perfusion maps were calculated using RAPID software. RADIATION DOSE REDUCTION: This exam was performed according to the departmental dose-optimization program which includes automated exposure control, adjustment of the mA and/or kV according to patient size and/or use of iterative reconstruction technique. CONTRAST:  OMNIPAQUE IOHEXOL 350 MG/ML SOLN COMPARISON:  07/27/2022 FINDINGS: CTA NECK FINDINGS Aortic arch: Atheromatous wall thickening. Three vessel branching. No acute finding Right carotid system: Mild atheromatous plaque at the bifurcation without significant stenosis. No ulceration or beading Left carotid system: No significant stenosis, ulceration, or beading Vertebral arteries: No proximal subclavian stenosis. The right vertebral artery is dominant. Severe stenosis with non visible lumen at the left vertebral origin. The left vertebral artery is diffusely attenuated appearing relative to the transverse foramen size. Skeleton: No acute finding.  Occipital craniectomy. Other neck: No acute finding. Upper chest: Mildly focal emphysema.  No acute finding Review of the MIP images confirms the above findings CTA HEAD FINDINGS Anterior circulation: Atheromatous calcification along the carotid siphons. No major branch occlusion,  beading, or flow reducing stenosis. Widespread intracranial atheromatous irregularity with high-grade branch narrowing seen diffusely, affecting the ACA and MCA circulation bilaterally. Difficult arterial visualization of the left MCA bifurcation due to closely neighboring crossing vein. No aneurysm or vascular malformation Posterior circulation: Atheromatous irregularity of the right vertebral and basilar arteries without flow reducing stenosis. Tiny left vertebral artery with limited contribution to the posterior circulation. Extensive atheromatous type irregularity of the posterior cerebral arteries with flow gap at the right P2 segment and high-grade narrowings at the left P2 and P3. Venous sinuses: Unremarkable for arterial timing Anatomic variants: None significant Review of the MIP images confirms the above findings CT Brain Perfusion Findings: CBF (<30%) Volume: 3mL Perfusion (Tmax>6.0s) volume: 13mL Mismatch Volume: 10mL Infarction Location:Right parasagittal parietal lobe, site of restricted diffusion on preceding CT with core infarct underestimated compared to MRI. IMPRESSION: 1. No emergent finding. 2. Advanced intracranial branch atherosclerosis similar to priors. Especially advanced bilateral PCA narrowing with flow gap at the right P2 segment 3. Thready flow throughout the left vertebral artery with severe stenosis or occlusion at the origin. 4. CT perfusion deficit matching brain MRI from yesterday. Electronically Signed   By: Tiburcio Pea M.D.   On: 09/05/2022 15:39   CT HEAD CODE STROKE WO CONTRAST  Result Date: 09/05/2022 CLINICAL DATA:  Code stroke.  Right-sided weakness EXAM: CT HEAD WITHOUT CONTRAST TECHNIQUE: Contiguous axial images were obtained from the base of the skull through the vertex without intravenous contrast. RADIATION DOSE REDUCTION: This exam was performed according to the departmental dose-optimization program which includes automated exposure control, adjustment of the  mA and/or kV according to patient size and/or use of iterative reconstruction technique. COMPARISON:  CT Head 09/04/22, MR Head 09/04/22 FINDINGS: Brain: No hemorrhage. No hydrocephalus. No extra-axial fluid collection. Redemonstrated right PCA territory infarct with chronic calcifications along the medial aspect of the bilateral occipital lobes. 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 Unchanged chronic calcification in the left cerebellar hemisphere. No CT evidence of an acute cortical infarct. 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 Unchanged size and shape of the ventricular system. Vascular: No hyperdense vessel or unexpected calcification. Skull: Prior suboccipital craniotomy. Sinuses/Orbits: No middle ear or mastoid effusion. Paranasal sinuses clear. Orbits are unremarkable. Other: None. ASPECTS [facility] Stroke Program Early CT Score): 10 when accounting for chronic findings. IMPRESSION: 1. No hemorrhage or CT evidence of an acute cortical infarct. 2. Redemonstrated right PCA territory infarct with chronic calcifications along the medial aspect of the bilateral occipital lobes. 3. 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 Findings were paged to Dr. Roda Shutters on 09/05/22 at 3:19 PM via [facility] paging system. Electronically Signed   By: Lorenza Cambridge M.D.   On: 09/05/2022 15:23    Pending Labs Unresulted Labs (From admission, onward)     Start     Ordered   09/07/22 0500  Basic metabolic panel  Daily,   R      09/06/22 0738   09/07/22 0500  CBC  Daily,   R      09/06/22 0738            Vitals/Pain Today's Vitals   09/06/22 1245 09/06/22 1252 09/06/22 1330 09/06/22 1430  BP: (!) 120/59  117/60 130/70  Pulse: 82  70 80  Resp: 17  (!) 27 20  Temp:  98.6 F (37 C)    TempSrc:      SpO2: 100%  94% 98%  Weight:      PainSc:         Isolation Precautions No active isolations  Medications Medications  0.9 %  sodium chloride infusion (Manually program via Guardrails IV Fluids) ( Intravenous Not Given 09/05/22 2132)  pantoprazole (PROTONIX) injection 40 mg (40 mg Intravenous Given 09/06/22 1102)  iohexol (OMNIPAQUE) 350 MG/ML injection 100 mL (100 mLs Intravenous Contrast Given 09/05/22 1524)  0.9 %  sodium chloride infusion (Manually program via Guardrails IV Fluids) (0 mLs Intravenous Stopped 09/06/22 0421)  pantoprazole (PROTONIX) 80 mg /NS 100 mL IVPB (0 mg Intravenous Stopped 09/05/22 1926)  ondansetron (ZOFRAN) injection 4 mg (4 mg Intravenous Given 09/05/22 1852)  fentaNYL (SUBLIMAZE) injection 25 mcg (25 mcg Intravenous Given 09/05/22 1955)  lactated ringers bolus 500 mL (0 mLs Intravenous Stopped 09/05/22 2033)  iohexol (OMNIPAQUE) 350 MG/ML injection 65 mL (65 mLs Intravenous Contrast Given 09/05/22 1752)  lactated ringers bolus 500 mL (0 mLs Intravenous Stopped 09/05/22 2032)  iron sucrose (VENOFER) 100 mg in sodium chloride 0.9 % 100 mL IVPB (0 mg Intravenous Stopped 09/06/22 0510)  potassium chloride 10 mEq in 100 mL IVPB (0 mEq Intravenous Stopped 09/06/22 1254)    Mobility non-ambulatory     Focused Assessments    R Recommendations: See Admitting Provider Note  Report given to:   Additional Notes:

## 2022-09-06 NOTE — Progress Notes (Signed)
STROKE TEAM PROGRESS NOTE   SUBJECTIVE (INTERVAL HISTORY) No family is at the bedside.  Overall her condition is rapidly improving. She is awake alert and orientated to self, place, age but not to time. Her right sided weakness now back to baseline. She had blood transfusion and Hb this am is 9.4. BP also improved some.    OBJECTIVE Temp:  [97.8 F (36.6 C)-99.3 F (37.4 C)] 98.6 F (37 C) (08/22 0902) Pulse Rate:  [77-132] 90 (08/22 0902) Cardiac Rhythm: Sinus tachycardia (08/21 1608) Resp:  [15-30] 20 (08/22 0902) BP: (96-125)/(47-88) 125/61 (08/22 0902) SpO2:  [95 %-100 %] 97 % (08/22 0902) Weight:  [70.2 kg] 70.2 kg (08/21 1500)  Recent Labs  Lab 09/05/22 1458 09/05/22 2115  GLUCAP 106* 96   Recent Labs  Lab 09/05/22 1458 09/05/22 1508 09/06/22 0607  NA 139 142 136  K 3.5 3.7 3.2*  CL 110 112* 107  CO2 18*  --  20*  GLUCOSE 121* 114* 96  BUN 23* 23* 15  CREATININE 0.79 0.70 0.61  CALCIUM 8.6*  --  7.8*   Recent Labs  Lab 09/05/22 1458  AST 18  ALT 21  ALKPHOS 121  BILITOT 0.6  PROT 6.9  ALBUMIN 3.3*   Recent Labs  Lab 09/05/22 1458 09/05/22 1508 09/06/22 0607  WBC 13.9*  --  9.1  NEUTROABS 11.4*  --   --   HGB 6.1* 6.8* 9.4*  HCT 20.4* 20.0* 27.9*  MCV 84.6  --  86.4  PLT 499*  --  309   No results for input(s): "CKTOTAL", "CKMB", "CKMBINDEX", "TROPONINI" in the last 168 hours. Recent Labs    09/05/22 1458  LABPROT 14.3  INR 1.1   Recent Labs    09/05/22 1954  COLORURINE STRAW*  LABSPEC >1.046*  PHURINE 5.0  GLUCOSEU NEGATIVE  HGBUR NEGATIVE  BILIRUBINUR NEGATIVE  KETONESUR 5*  PROTEINUR NEGATIVE  NITRITE NEGATIVE  LEUKOCYTESUR NEGATIVE       Component Value Date/Time   CHOL 84 09/06/2022 0605   TRIG 101 09/06/2022 0605   HDL 27 (L) 09/06/2022 0605   CHOLHDL 3.1 09/06/2022 0605   VLDL 20 09/06/2022 0605   LDLCALC 37 09/06/2022 0605   Lab Results  Component Value Date   HGBA1C 5.4 09/06/2022      Component Value  Date/Time   LABOPIA NONE DETECTED 09/05/2022 1953   COCAINSCRNUR NONE DETECTED 09/05/2022 1953   LABBENZ NONE DETECTED 09/05/2022 1953   AMPHETMU NONE DETECTED 09/05/2022 1953   THCU NONE DETECTED 09/05/2022 1953   LABBARB NONE DETECTED 09/05/2022 1953    Recent Labs  Lab 09/05/22 1458  ETH <10    I have personally reviewed the radiological images below and agree with the radiology interpretations.  MR BRAIN WO CONTRAST  Result Date: 09/05/2022 CLINICAL DATA:  Stroke follow-up EXAM: MRI HEAD WITHOUT CONTRAST TECHNIQUE: Axial and coronal diffusion-weighted sequences of the brain and surrounding structures were obtained without intravenous contrast. COMPARISON:  09/04/2022 MRI head FINDINGS: New areas of restricted diffusion with ADC correlate adjacent to the previously noted diffusion abnormality in the medial right PCA territory, with new foci slightly more inferiorly along the medial right occipital lobe (series 5, image [id number]) and in the more lateral right occipital lobe (series 5, image 65). 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 These findings were discussed by telephone on 09/05/2022 at 11:06 pm with provider ARORA. Electronically Signed   By: Wiliam Ke M.D.   On:  09/05/2022 23:08   CT Angio Chest PE W and/or Wo Contrast  Result Date: 09/05/2022 CLINICAL DATA:  Pulmonary embolism (PE) suspected, high prob shortness of breath. EXAM: CT ANGIOGRAPHY CHEST WITH CONTRAST TECHNIQUE: Multidetector CT imaging of the chest was performed using the standard protocol during bolus administration of intravenous contrast. Multiplanar CT image reconstructions and MIPs were obtained to evaluate the vascular anatomy. RADIATION DOSE REDUCTION: This exam was performed according to the departmental dose-optimization program which includes automated exposure control, adjustment of the mA and/or kV according to patient size and/or use of iterative reconstruction technique.  CONTRAST:  65mL OMNIPAQUE IOHEXOL 350 MG/ML SOLN COMPARISON:  None Available. FINDINGS: Cardiovascular: No evidence of embolism to the proximal subsegmental pulmonary artery level. Normal cardiac size. No pericardial effusion. No aortic aneurysm. There are coronary artery calcifications, in keeping with coronary artery disease. There are also moderate to severe peripheral atherosclerotic vascular calcifications of thoracic aorta and its major branches. Mediastinum/Nodes: Asymmetrically enlarged and heterogeneous right thyroid lobe without discrete nodule is not well evaluated on the current exam. 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 There is short segment marked narrowing versus complete occlusion at the origin of the left vertebral artery, not well evaluated on the current exam. No solid / cystic mediastinal masses. The esophagus is nondistended precluding optimal assessment. No axillary, mediastinal or hilar lymphadenopathy by size criteria. Lungs/Pleura: The central tracheo-bronchial tree is patent. There are patchy areas of linear, plate-like atelectasis and/or scarring throughout bilateral lungs. No mass or consolidation. No pleural effusion or pneumothorax. No suspicious lung nodules. Upper Abdomen: There is a partially exophytic 2.0 x 2.4 cm cyst arising from the left kidney upper pole, posteriorly. There is a 1.6 x 1.8 cm hypoattenuating structure in the posterior aspect of the pancreatic body, not well evaluated on the current exam but unchanged since the prior study dating back to 09/13/2021. Remaining visualized upper abdominal viscera within normal limits. Please refer to simultaneously acquired but separately dictated CT scan abdomen and pelvis report for additional details. Musculoskeletal: The visualized soft tissues of the chest wall are grossly unremarkable. No suspicious osseous lesions. There are mild multilevel degenerative changes in the visualized spine. Review of the MIP images  confirms the above findings. IMPRESSION: 1. No evidence of pulmonary embolism. No acute intrathoracic pathology identified. 2. Short segment marked narrowing versus complete occlusion at the origin of the hypoplastic left vertebral artery, not well evaluated on the current exam. 3. Multiple other nonacute observations, as described above. Aortic Atherosclerosis (ICD10-I70.0). Electronically Signed   By: Jules Schick M.D.   On: 09/05/2022 18:41   CT ABDOMEN PELVIS W CONTRAST  Result Date: 09/05/2022 CLINICAL DATA:  Acute abdominal pain EXAM: CT ABDOMEN AND PELVIS WITH CONTRAST TECHNIQUE: Multidetector CT imaging of the abdomen and pelvis was performed using the standard protocol following bolus administration of intravenous contrast. RADIATION DOSE REDUCTION: This exam was performed according to the departmental dose-optimization program which includes automated exposure control, adjustment of the mA and/or kV according to patient size and/or use of iterative reconstruction technique. CONTRAST:  65mL OMNIPAQUE IOHEXOL 350 MG/ML SOLN COMPARISON:  CT abdomen and pelvis 08/29/2022 FINDINGS: Lower chest: No acute abnormality. Hepatobiliary: No focal liver abnormality is seen. No gallstones, gallbladder wall thickening, or biliary dilatation. Pancreas: Cystic pancreatic lesion in the body of the pancreas measures 14 mm and appears unchanged. The pancreas is otherwise within normal limits. Spleen: Normal in size without focal abnormality. Adrenals/Urinary Tract: There is contrast in the bladder and renal  collecting systems which limits evaluation for small calculi. There is no hydronephrosis or perinephric fat stranding. There is a cyst in the superior pole of the left kidney measuring 2.1 cm. The adrenal glands and bladder are within normal limits. Stomach/Bowel: There is a small hiatal hernia. Stomach is otherwise within normal limits. Appendix appears normal. No evidence of bowel wall thickening, distention, or  inflammatory changes. Vascular/Lymphatic: Aortic atherosclerosis. No enlarged abdominal or pelvic lymph nodes. Reproductive: Uterus and bilateral adnexa are unremarkable. Other: No abdominal wall hernia or abnormality. No abdominopelvic ascites. Musculoskeletal: No acute or significant osseous findings. IMPRESSION: 1. No acute localizing process in the abdomen or pelvis. 2. Small hiatal hernia. 3. Stable cystic lesion in the body of the pancreas. Recommend follow-up MRI in 2 years to confirm stability. 4. Left Bosniak I benign renal cyst measuring 2.1 cm. No follow-up imaging is recommended. JACR 2018 Feb; 264-273, Management of the Incidental Renal Mass on CT, [employer] 2021; [phone], Bosniak Classification of Cystic Renal Masses, Version 2019. Aortic Atherosclerosis (ICD10-I70.0). Electronically Signed   By: Darliss Cheney M.D.   On: 09/05/2022 18:31   CT ANGIO HEAD NECK W WO CM W PERF (CODE STROKE)  Result Date: 09/05/2022 CLINICAL DATA:  Acute stroke suspected EXAM: CT ANGIOGRAPHY HEAD AND NECK CT PERFUSION BRAIN TECHNIQUE: Multidetector CT imaging of the head and neck was performed using the standard protocol during bolus administration of intravenous contrast. Multiplanar CT image reconstructions and MIPs were obtained to evaluate the vascular anatomy. Carotid stenosis measurements (when applicable) are obtained utilizing NASCET criteria, using the distal internal carotid diameter as the denominator. Multiphase CT imaging of the brain was performed following IV bolus contrast injection. Subsequent parametric perfusion maps were calculated using RAPID software. RADIATION DOSE REDUCTION: This exam was performed according to the departmental dose-optimization program which includes automated exposure control, adjustment of the mA and/or kV according to patient size and/or use of iterative reconstruction technique. CONTRAST:  OMNIPAQUE IOHEXOL 350 MG/ML SOLN COMPARISON:  07/27/2022 FINDINGS: CTA NECK  FINDINGS Aortic arch: Atheromatous wall thickening. Three vessel branching. No acute finding Right carotid system: Mild atheromatous plaque at the bifurcation without significant stenosis. No ulceration or beading Left carotid system: No significant stenosis, ulceration, or beading Vertebral arteries: No proximal subclavian stenosis. The right vertebral artery is dominant. Severe stenosis with non visible lumen at the left vertebral origin. The left vertebral artery is diffusely attenuated appearing relative to the transverse foramen size. Skeleton: No acute finding.  Occipital craniectomy. Other neck: No acute finding. Upper chest: Mildly focal emphysema.  No acute finding Review of the MIP images confirms the above findings CTA HEAD FINDINGS Anterior circulation: Atheromatous calcification along the carotid siphons. No major branch occlusion, beading, or flow reducing stenosis. Widespread intracranial atheromatous irregularity with high-grade branch narrowing seen diffusely, affecting the ACA and MCA circulation bilaterally. Difficult arterial visualization of the left MCA bifurcation due to closely neighboring crossing vein. No aneurysm or vascular malformation Posterior circulation: Atheromatous irregularity of the right vertebral and basilar arteries without flow reducing stenosis. Tiny left vertebral artery with limited contribution to the posterior circulation. Extensive atheromatous type irregularity of the posterior cerebral arteries with flow gap at the right P2 segment and high-grade narrowings at the left P2 and P3. Venous sinuses: Unremarkable for arterial timing Anatomic variants: None significant Review of the MIP images confirms the above findings CT Brain Perfusion Findings: CBF (<30%) Volume: 3mL Perfusion (Tmax>6.0s) volume: 13mL Mismatch Volume: 10mL Infarction Location:Right parasagittal parietal lobe, site of restricted  diffusion on preceding CT with core infarct underestimated compared to MRI.  IMPRESSION: 1. No emergent finding. 2. Advanced intracranial branch atherosclerosis similar to priors. Especially advanced bilateral PCA narrowing with flow gap at the right P2 segment 3. Thready flow throughout the left vertebral artery with severe stenosis or occlusion at the origin. 4. CT perfusion deficit matching brain MRI from yesterday. Electronically Signed   By: Tiburcio Pea M.D.   On: 09/05/2022 15:39   CT HEAD CODE STROKE WO CONTRAST  Result Date: 09/05/2022 CLINICAL DATA:  Code stroke.  Right-sided weakness EXAM: CT HEAD WITHOUT CONTRAST TECHNIQUE: Contiguous axial images were obtained from the base of the skull through the vertex without intravenous contrast. RADIATION DOSE REDUCTION: This exam was performed according to the departmental dose-optimization program which includes automated exposure control, adjustment of the mA and/or kV according to patient size and/or use of iterative reconstruction technique. COMPARISON:  CT Head 09/04/22, MR Head 09/04/22 FINDINGS: Brain: No hemorrhage. No hydrocephalus. No extra-axial fluid collection. Redemonstrated right PCA territory infarct with chronic calcifications along the medial aspect of the bilateral occipital lobes. 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 Unchanged chronic calcification in the left cerebellar hemisphere. No CT evidence of an acute cortical infarct. Unchanged chronic infarct in the left parietal lobe. Unchanged size and shape of the ventricular system. Vascular: No hyperdense vessel or unexpected calcification. Skull: Prior suboccipital craniotomy. Sinuses/Orbits: No middle ear or mastoid effusion. Paranasal sinuses clear. Orbits are unremarkable. Other: None. ASPECTS [facility] Stroke Program Early CT Score): 10 when accounting for chronic findings. IMPRESSION: 1. No hemorrhage or CT evidence of an acute cortical infarct. 2. Redemonstrated right PCA  territory infarct with chronic calcifications along the medial aspect of the bilateral occipital lobes. 3. 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 Findings were paged to Dr. Roda Shutters on 09/05/22 at 3:19 PM via [facility] paging system. Electronically Signed   By: Lorenza Cambridge M.D.   On: 09/05/2022 15:23   MR BRAIN WO CONTRAST  Result Date: 09/04/2022 CLINICAL DATA:  62 year old female with neurologic deficit and new vision changes. Right PCA territory ischemia last month. EXAM: MRI HEAD WITHOUT CONTRAST TECHNIQUE: Multiplanar, multiecho pulse sequences of the brain and surrounding structures were obtained without intravenous contrast. COMPARISON:  Head CT 0027 hours today. Recent brain MRI 08/07/2022 and earlier. FINDINGS: Brain: Persistent diffusion abnormality in the medial right PCA territory, with a similar configuration and slightly less intense than demonstrated on 08/07/2022. Some of the diffusion there remains restricted. Mild cytotoxic edema. No malignant hemorrhagic transformation or mass effect. No restricted diffusion in any other vascular territory. Chronic infarcts with encephalomalacia in the posterior left MCA and left MCA/PCA watershed, bilateral cerebellum (severe on the right), bilateral deep gray nuclei and right brainstem. Confluent chronic blood products in the left cerebral peduncle, right cerebellar hemisphere. Fairly numerous additional chronic microhemorrhages which are concentrated in the vascular territories described above appear stable from last month. No superimposed midline shift, mass effect, evidence of mass lesion, ventriculomegaly, extra-axial collection or acute intracranial hemorrhage. Cervicomedullary junction and pituitary are within normal limits. Vascular: Major intracranial vascular flow voids are stable. Chronically abnormal distal left vertebral artery. See CTA head and neck last month. Skull and  upper cervical spine: Cervical spine motion artifact today. Bone marrow signal remains normal. Sinuses/Orbits: Stable, negative orbits and sinuses. Other: Stable mild left mastoid air cell fluid. Visible internal auditory structures appear normal.  IMPRESSION: 1. Unresolved Right PCA territory ischemia since last month, but no significant progression. No malignant hemorrhagic transformation or associated mass effect. 2. Otherwise stable advanced chronic ischemic disease in the brain with multiple areas of chronic hemorrhage. 3. No new intracranial abnormality. Electronically Signed   By: Odessa Fleming M.D.   On: 09/04/2022 05:48   DG Chest Portable 1 View  Result Date: 09/04/2022 CLINICAL DATA:  MRI clearance EXAM: PORTABLE CHEST 1 VIEW COMPARISON:  None Available. FINDINGS: The heart size and mediastinal contours are within normal limits. Both lungs are clear. The visualized skeletal structures are unremarkable. No unexpected metallic foreign body within the visualized thorax. IMPRESSION: No active disease. Electronically Signed   By: Helyn Numbers M.D.   On: 09/04/2022 03:53   CT Head Wo Contrast  Result Date: 09/04/2022 CLINICAL DATA:  Acute neurologic deficit, vision change EXAM: CT HEAD WITHOUT CONTRAST TECHNIQUE: Contiguous axial images were obtained from the base of the skull through the vertex without intravenous contrast. RADIATION DOSE REDUCTION: This exam was performed according to the departmental dose-optimization program which includes automated exposure control, adjustment of the mA and/or kV according to patient size and/or use of iterative reconstruction technique. COMPARISON:  MRI 08/07/2022 FINDINGS: Brain: Evolving subacute infarct within the right parafalcine occipital lobe. Remote cortical infarct within the left parietal cortex, lacunar infarcts within the left basal ganglia and left thalamus, and extensive encephalomalacia within the right cerebellar hemisphere which changes of right  suboccipital craniotomy again noted. No acute intracranial hemorrhage or infarct. No abnormal mass effect or midline shift. Dystrophic calcifications again noted within the except ule cortices bilaterally and left cerebellar hemisphere. Ventricular size is normal. Vascular: No hyperdense vessel or unexpected calcification. Skull: Normal. Negative for fracture or focal lesion. Sinuses/Orbits: No acute finding. Other: Mastoid air cells and middle ear cavities are clear. IMPRESSION: 1. Evolving subacute infarct within the right parafalcine occipital lobe. No acute intracranial hemorrhage or infarct. No abnormal mass effect or midline shift. 2. Stable encephalomalacia within the right cerebellar hemisphere with changes of right suboccipital craniotomy. 3. Stable remote infarcts as outlined above. Electronically Signed   By: Helyn Numbers M.D.   On: 09/04/2022 01:00   CT ABDOMEN PELVIS W CONTRAST  Result Date: 08/29/2022 CLINICAL DATA:  Left lower quadrant abdominal pain, constipation EXAM: CT ABDOMEN AND PELVIS WITH CONTRAST TECHNIQUE: Multidetector CT imaging of the abdomen and pelvis was performed using the standard protocol following bolus administration of intravenous contrast. RADIATION DOSE REDUCTION: This exam was performed according to the departmental dose-optimization program which includes automated exposure control, adjustment of the mA and/or kV according to patient size and/or use of iterative reconstruction technique. CONTRAST:  OMNIPAQUE IOHEXOL 300 MG/ML  SOLN COMPARISON:  09/13/2021 FINDINGS: Lower chest: Mild scarring/atelectasis in the medial right lower lobe. Hepatobiliary: Liver is within normal limits. Gallbladder is unremarkable. No intrahepatic or extrahepatic dilatation. Pancreas: 18 mm unilocular cyst along the posterior aspect of the pancreatic tail (series 2/image 20), unchanged. No pancreatic ductal dilatation. Spleen: Within normal limits. Adrenals/Urinary Tract: Adrenal glands  are within normal limits. Two simple left renal cysts, measuring up to 2.6 cm in the lower pole (series 2/image 35), benign (Bosniak I). No follow-up is recommended. Right kidney is within normal limits. Mild left hydronephrosis. Associated punctate distal left ureteral calculus at the UVJ (series 2/image 71). Bladder is underdistended but unremarkable. Stomach/Bowel: Stomach is notable for a small hiatal hernia. No evidence of bowel obstruction. Normal appendix (series 2/image 57). No colonic wall thickening  or inflammatory changes. Vascular/Lymphatic: No evidence of abdominal aortic aneurysm. Atherosclerotic calcifications of the abdominal aorta and branch vessels, although vessels remain patent. No suspicious abdominopelvic lymphadenopathy. Reproductive: Uterus is within normal limits. Bilateral ovaries are within normal limits. Other: No abdominopelvic ascites. Musculoskeletal: Visualized osseous structures are within normal limits. IMPRESSION: Punctate distal left ureteral calculus at the UVJ. Associated mild left hydronephrosis. 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 Follow-up MRI abdomen with/without contrast is suggested in 1 year. Electronically Signed   By: Charline Bills M.D.   On: 08/29/2022 18:10   DG Toe Great Right  Result Date: 08/15/2022 CLINICAL DATA:  Right great toe pain.  Bruise.  No known injury. EXAM: RIGHT GREAT TOE COMPARISON:  None Available. FINDINGS: There is diffuse decreased bone mineralization. No acute fracture is seen. Mild hallux valgus. No significant joint space narrowing. No dislocation. IMPRESSION: 1. No acute fracture. 2. Mild hallux valgus. Electronically Signed   By: Neita Garnet M.D.   On: 08/15/2022 12:05   DG Abd 2 Views  Result Date: 08/09/2022 CLINICAL DATA:  Constipation EXAM: ABDOMEN - 2 VIEW COMPARISON:  Abdominal x-ray 06/16/2022 FINDINGS: The bowel gas pattern is normal. Stool burden is  moderate. There is no evidence of free air. No radio-opaque calculi or other significant radiographic abnormality is seen. IMPRESSION: Negative. Electronically Signed   By: Darliss Cheney M.D.   On: 08/09/2022 20:53   DG Elbow 2 Views Right  Result Date: 08/08/2022 CLINICAL DATA:  Pain aggravated by exercise EXAM: RIGHT ELBOW - 2 VIEW COMPARISON:  None Available. FINDINGS: No evidence of acute fracture or dislocation. No elbow joint effusion. Demineralization. Mild degenerative changes about the elbow. IMPRESSION: No acute fracture or dislocation. Electronically Signed   By: Minerva Fester M.D.   On: 08/08/2022 18:51   MR BRAIN WO CONTRAST  Result Date: 08/07/2022 CLINICAL DATA:  Diplopia. EXAM: MRI HEAD WITHOUT CONTRAST TECHNIQUE: Multiplanar, multiecho pulse sequences of the brain and surrounding structures were obtained without intravenous contrast. COMPARISON:  MRI brain 07/28/2022. FINDINGS: Brain: Mixed acute and subacute infarcts in the medial, inferior aspect of the right occipital lobe. Foci of petechial hemorrhage within the areas of subacute infarct. No significant mass effect. Encephalomalacia with surrounding hemosiderin deposition in the right cerebellar hemisphere is unchanged. Old hemorrhage in the left thalamus. Background of moderate chronic small-vessel disease. No acute hydrocephalus or extra-axial collection. Vascular: Normal flow voids. Skull and upper cervical spine: Normal marrow signal. Sinuses/Orbits: Negative. Other: None. IMPRESSION: Mixed acute and subacute infarcts in the medial, inferior aspect of the right occipital lobe. Foci of petechial hemorrhage within the areas of subacute infarct. No significant mass effect. Electronically Signed   By: Orvan Falconer M.D.   On: 08/07/2022 19:45     PHYSICAL EXAM  Temp:  [97.8 F (36.6 C)-99.3 F (37.4 C)] 98.6 F (37 C) (08/22 0902) Pulse Rate:  [77-132] 90 (08/22 0902) Resp:  [15-30] 20 (08/22 0902) BP: (96-125)/(47-88)  125/61 (08/22 0902) SpO2:  [95 %-100 %] 97 % (08/22 0902) Weight:  [70.2 kg] 70.2 kg (08/21 1500)  General - well nourished, well developed, in no apparent distress.     Ophthalmologic - fundi not visualized due to noncooperation.     Cardiovascular - regular rhythm and rate   Neuro - awake, alert, eyes open, mild dysarthria, orientated to self, place and age but not to time. No aphasia but slow talking, answered all questions appropriately, following all simple commands.  Able to name and repeat in dysarthric voice. No gaze palsy, left visual field deficit, not able to see hand waving, right visual field able to see hand waving only. Mild right facial droop. LUE no drift. LUE 3/5 proximal with drift and 0/5 distally. LLE 5/5, RLE 4-/5. Sensation decreased on the RUE but symmetrical BLEs, left FTN intact grossly but slow, gait not tested.    ASSESSMENT/PLAN [name] is a 62 y.o. 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 Symptoms gradually improving in ER, close to baseline. Found to have low BP and severe anemia. . No tPA given due to hx of ICH and recent stroke.   Stroke:  right PCA small acute on chronic infarcts, likely due to large vessel disease in the setting of severe anemia and hypotension CT no acute finding CTA head and neck - Advanced intracranial branch atherosclerosis similar to priors. Especially advanced bilateral PCA narrowing with flow gap at the right P2 segment Thready flow throughout the left vertebral artery with severe stenosis or occlusion at the origin. MRI  New areas of acute infarct in the medial right PCA territory  2D Echo  EF 65-70% LDL 37 HgbA1c 5.4 SCDs for VTE prophylaxis aspirin 81 mg daily  and Brilinta (ticagrelor) 90 mg bid prior to admission, now on No antithrombotic. Hold off antiplatelet for now given severe anemia and work up pending. Once Hb stable and cleared by GI, would consider to resume ASA or plavix monotherapy.  Ongoing aggressive stroke risk factor management Therapy recommendations:  Pending Disposition:  pending  GIB with severe anemia 07/2022 while in rehab, found to have gradual decline of Hb. Stool occult blood was positive. 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 Patient's hemoglobin has remained stable after her EGD procedure. Patient continues on antiplatelet therapy. GI plan for outpatient GI follow-up.  This admission, Hb 6.1-> 2U PRBC ->9.4 GI consulted and plan for EGD +/- colonoscopy Hold off antiplatelet now Once Hb stable and cleared by GI, would consider to resume ASA or plavix monotherapy.   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 MRA showed left P2 high grade stenosis. CUS neg. EF 60-65%. LDL 92 and A1C 5.3. discharge to CIR Pt had right PCA stroke in 07/2022. CTA head & neck multifocal severe P2 PCA stenosis, critical stenosis versus occlusion of distal right ACA, severe right P2 PCA, severe right M2 MCA small nondominant left intradural vertebral artery with severe stenosis and distal occlusion, moderate distal basilar artery stenosis, moderate paraclinoid ICA stenosis laterally. EF 65-70%. LDL 123 and A1C 5.6. she was put on ASA and plavix for 3 months and lipitor 80.Residue left visual field deficit.  She was discharged to CIR.  While in rehab 07/2022, she had worsening vision on the left, MRI done showed some more acute right PCA on the subacute PCA infarcts. She was started on ASA and brilinta.  She was in [facility] ED  from 08/29/22 to 09/04/22 for kidney stone, and also anemia. Her Hb continues to have gradual decline. Yesterday early morning, tele neurology was called for worsening vision. MRI  showed continued evolution of right PCA infarct. She was continued on ASA and brilinta.   Hx of hypertension Hypotension due to anemia Stable on the low end S/p IV resuscitation S/p blood transfusion Long term BP goal normotensive  Hyperlipidemia Home meds:  lipitor 80  LDL 37, goal < 70 Will resume statin once po access Continue statin at discharge  Other Stroke Risk Factors Former smoker Alcohol use Obesity   Other Active Problems R cerebellar tumor s/p resection as a teen   Hospital day # 1  Neurology will sign off. Please call with questions. Pt will follow up with Dr. Teresa Coombs at [facility] on 11/06/22. Thanks for the consult.   Marvel Plan, MD PhD Stroke Neurology 09/06/2022 12:40 PM    To contact Stroke Continuity provider, please refer to [email or web address]. After hours, contact General Neurology

## 2022-09-06 NOTE — ED Notes (Signed)
This paramedic went in to give patient protonix and her IV was out. Pt was unaware. Potassium stopped. Attempted to look for another IV site unsuccessfully, IV team order placed.

## 2022-09-06 NOTE — Progress Notes (Signed)
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 On EMS arrival, she was aphasic and right side was flaccid. BP of 107/77 and symptoms improved with lying down..   On 08/07/22 she had symptoms of diplopia and had MRI which showed mixed acute and subacute infarcts in the medial, inferior aspect of the right occipital lobe. Foci of petechial hemorrhage within the areas of subacute infarct. No significant mass effect. Patient's Plavix was changed to Brilinta 90 mg twice daily and would also continued low-dose aspirin. Hospital course complicated by anemia hemoglobin dipping to 11.9-8.6 with Hemoccult stool positive. Gastroenterology services consulted undergoing upper GI endoscopy 08/12/2022 per Dr. Elnoria Howard showing mucosal changes in the esophagus consistent with esophagitis. Erosive gastropathy with stigmata of recent bleeding. 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 Placed on PPI and was discharged home on 08/17/22 and continued on dual antiplatelet therapy and plans for possible outpatient colonoscopy.    She later presented to [facility] on 08/29/22 with abdominal pain thought secondary to left ureteral calculus. She received pain management in the ED while awaiting for placement to a facility. Then yesterday while in ED she reports new onset visual changes, neurology was consulted and MRI obtained demonstrated unresolved right PCA  territory ischemic since last month but no significant progression. No malignant hemorrhagic transformation or mass effect. She was then discharged home 8/20 as she was unsuccessfully placed into facility per social work.  No tPA given due to hx of recent stroke and ICH.  CT head>> with no hemorrhage or acute infarct. Redemonstrated right PCA territory infarct. CTA head and neck>> with no emergent findings. Advanced intracranial branch atherosclerosis similar to prior.   CTA chest>> negative for pulmonary embolism.  CT abd/pelvis >>with no acute findings.  labs >> worsening anemia Hgb of 6.1 down from baseline of 7-8. Rectal exam per ED physician revealed gross melena. 2 units of pRBC was ordered.  Neurology consulted MRI ordered, patient was admitted for further management  MRI  brain>> new areas of acute infarct in the medial right PCA territory   Subjective: Seen and examined Resting well No complaints Weak Rt side Overnight afebrile vital stable in low 100s systolic, saturating 95 to 96 Labs with hypokalemia 3.2 hemoglobin increased to 9.4 g.  Assessment and Plan: Principal Problem:   Acute blood loss anemia Active Problems:   Aphasia   Pancreatic cyst   History of CVA (cerebrovascular accident)   Acute stroke due to ischemia (HCC)   ABLA Melena: S/P 2 units PRBC, hemoglobin increased as below.  It has been downtrending in past month Last EGD 08/12/2022 per Dr. Hung>Esophagitis dissecans was noted, but this was not the source, had mltiple small nonbleeding gastric antral erosions, two small AVMs in D2-not bleeding. Now with melena and acute on chronic anemia in the setting of DAPT. S/o IV Venofer 100 mg x 1. Has normal B12 and folic level. GI consulted> planning for possible eventual enteroscopy plus minus colonoscopy- pending eval by Dr Myrtie Neither, Cont to trend H&H continue PPI  twice daily. Holding DAPT for now.  Aphasia New acute infarct on medial right PCA territory History of  CVA on asa/brilinta PTA  Extensive intracranial atherosclerosis Dyslipidemia: S/p extensive workup with CT head CT head and neck see above, MRI brain positive for acute stroke in the medial right PCA territory.  Speech appears better.Neurology following completing stroke workup with CT angio head, HbA1c lipid panel, serial neurocheck PT OT and speech eval. had recent echo with bubble study on 7/13. Antiplatelet on hold in the setting of severe anemia GI bleeding.  Statin as above  Hypertension: BP soft.not on meds  Hypokalemia Mild metabolic acidosis: Will replace potassium, encourage oral intake.  Pancreatic cyst -follow up MRI in 2 years recommended  DVT prophylaxis: SCDs Start: 09/05/22 2309 Code Status:   Code Status: Full Code Family Communication: plan of care discussed with patient at bedside. Patient status is:  INPATIENT because of STROKE GI BLEEDING Level of care: Progressive   Dispo: The patient is from: HOME W/ FAMILY            Anticipated disposition: TBD Objective: Vitals last 24 hrs: Vitals:   09/06/22 0645 09/06/22 0654 09/06/22 0803 09/06/22 0902  BP: (!) 104/56  109/61 125/61  Pulse: 77  78 90  Resp: (!) 21  19 20   Temp:  98.9 F (37.2 C) 98.7 F (37.1 C) 98.6 F (37 C)  TempSrc:  Oral Oral Axillary  SpO2: 95%  95% 97%  Weight:       Weight change:   Physical Examination: General exam: alert awake, older than stated age HEENT:Oral mucosa moist, Ear/Nose WNL grossly Respiratory system: bilaterally CLEAR BS, no use of accessory muscle Cardiovascular system: S1 & S2 +, No JVD. Gastrointestinal system: Abdomen soft,NT,ND, BS+ Nervous System:Alert, awake, moving UE, weakness on Rt side Extremities: LE edema neg,distal peripheral pulses palpable.  Skin: No rashes,no icterus. MSK: Normal muscle bulk,tone, power  Medications reviewed:  Scheduled Meds:  sodium chloride   Intravenous Once   pantoprazole (PROTONIX) IV  40 mg Intravenous Q12H    Continuous Infusions:  potassium chloride 10 mEq (09/06/22 0859)    Diet Order             Diet NPO time specified  Diet effective now                    Intake/Output Summary (Last 24 hours) at 09/06/2022 0914 Last data filed at 09/06/2022 0510 Gross per 24 hour  Intake 1955.98 ml  Output --  Net 1955.98 ml   Net IO Since Admission: 1,955.98 mL [09/06/22 0914]  Wt Readings from Last 3 Encounters:  09/05/22 70.2 kg  09/01/22 68 kg  08/12/22 68 kg     Unresulted Labs (From admission, onward)     Start     Ordered   09/07/22 0500  Basic metabolic panel  Daily,   R      09/06/22 0738   09/07/22 0500  CBC  Daily,   R      09/06/22 0738   09/06/22 0857  Lipid panel  Add-on,   AD        09/06/22 0856   09/06/22 0857  Hemoglobin A1c  Add-on,   AD        09/06/22 0856          Data Reviewed: I have personally reviewed following labs and imaging studies CBC: Recent Labs  Lab 09/05/22 1458 09/05/22 1508 09/06/22 0607  WBC 13.9*  --  9.1  NEUTROABS 11.4*  --   --   HGB 6.1* 6.8* 9.4*  HCT 20.4* 20.0* 27.9*  MCV 84.6  --  86.4  PLT 499*  --  309   Basic Metabolic Panel: Recent Labs  Lab 09/05/22 1458 09/05/22 1508 09/06/22 0607  NA 139 142 136  K 3.5 3.7 3.2*  CL 110 112* 107  CO2 18*  --  20*  GLUCOSE 121* 114* 96  BUN 23* 23* 15  CREATININE 0.79 0.70 0.61  CALCIUM 8.6*  --  7.8*   GFR: Estimated Creatinine Clearance: 68.6 mL/min (by C-G formula based on SCr of 0.61 mg/dL). Liver Function Tests: Recent Labs  Lab 09/05/22 1458  AST 18  ALT 21  ALKPHOS 121  BILITOT 0.6  PROT 6.9  ALBUMIN 3.3*  No results for input(s): "LIPASE", "AMYLASE" in the last 168 hours. No results for input(s): "AMMONIA" in the last 168 hours. Coagulation Profile: Recent Labs  Lab 09/05/22 1458  INR 1.1    Recent Labs  Lab 09/05/22 1458 09/05/22 2115  GLUCAP 106* 96    Recent Results (from the past 240 hour(s))  Urine Culture (for pregnant, neutropenic  or urologic patients or patients with an indwelling urinary catheter)     Status: Abnormal   Collection Time: 08/29/22  5:23 PM   Specimen: Urine, Clean Catch  Result Value Ref Range Status   Specimen Description   Final    URINE, CLEAN CATCH Performed at [facility], [address], [place], [place] [address]    Special Requests   Final    NONE Performed at [facility], [address], [place], [place] [address]    Culture (A)  Final    50,000 COLONIES/mL LACTOBACILLUS SPECIES Standardized susceptibility testing for this organism is not available. Performed at [facility], [address] [place], [place] [address]    Report Status 09/02/2022 FINAL  Final    Antimicrobials: Anti-infectives (From admission, onward)    None      Culture/Microbiology    Component Value Date/Time   SDES  08/29/2022 1723    URINE, CLEAN CATCH Performed at [facility], [address] [place] [place], [place] [address]    [facility]  08/29/2022 1723    NONE Performed at [facility], [address] [place], [place] [address]    CULT (A) 08/29/2022 1723    50,000 COLONIES/mL LACTOBACILLUS SPECIES Standardized susceptibility testing for this organism is not available. Performed at [facility], [address], [place], [place] [address]    REPTSTATUS 09/02/2022 FINAL 08/29/2022 1723    Other culture-see note  Radiology Studies: MR BRAIN WO CONTRAST  Result Date: 09/05/2022 CLINICAL DATA:  Stroke follow-up EXAM: MRI HEAD WITHOUT CONTRAST TECHNIQUE: Axial and coronal diffusion-weighted sequences of the brain and surrounding structures were obtained without intravenous contrast. COMPARISON:  09/04/2022 MRI head FINDINGS: New areas of restricted diffusion with ADC correlate adjacent to the previously noted diffusion abnormality in the medial right PCA territory, with new foci slightly more inferiorly along the medial right occipital lobe (series  5, image [id number]) and in the more lateral right occipital lobe (series 5, image 65). 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 These findings were discussed by telephone on 09/05/2022 at 11:06 pm with provider ARORA. Electronically Signed   By: Wiliam Ke M.D.   On: 09/05/2022 23:08   CT Angio Chest PE W and/or Wo Contrast  Result Date:  09/05/2022 CLINICAL DATA:  Pulmonary embolism (PE) suspected, high prob shortness of breath. EXAM: CT ANGIOGRAPHY CHEST WITH CONTRAST TECHNIQUE: Multidetector CT imaging of the chest was performed using the standard protocol during bolus administration of intravenous contrast. Multiplanar CT image reconstructions and MIPs were obtained to evaluate the vascular anatomy. RADIATION DOSE REDUCTION: This exam was performed according to the departmental dose-optimization program which includes automated exposure control, adjustment of the mA and/or kV according to patient size and/or use of iterative reconstruction technique. CONTRAST:  65mL OMNIPAQUE IOHEXOL 350 MG/ML SOLN COMPARISON:  None Available. FINDINGS: Cardiovascular: No evidence of embolism to the proximal subsegmental pulmonary artery level. Normal cardiac size. No pericardial effusion. No aortic aneurysm. There are coronary artery calcifications, in keeping with coronary artery disease. There are also moderate to severe peripheral atherosclerotic vascular calcifications of thoracic aorta and its major branches. Mediastinum/Nodes: Asymmetrically enlarged and heterogeneous right thyroid lobe without discrete nodule is not well evaluated on the current exam. 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 There is short segment marked narrowing versus complete occlusion at the origin of the left vertebral artery, not well evaluated on the current exam. No solid / cystic mediastinal masses. The esophagus is nondistended precluding optimal assessment. No axillary, mediastinal or hilar  lymphadenopathy by size criteria. Lungs/Pleura: The central tracheo-bronchial tree is patent. There are patchy areas of linear, plate-like atelectasis and/or scarring throughout bilateral lungs. No mass or consolidation. No pleural effusion or pneumothorax. No suspicious lung nodules. Upper Abdomen: There is a partially exophytic 2.0 x 2.4 cm cyst arising from the left kidney upper pole, posteriorly. There is a 1.6 x 1.8 cm hypoattenuating structure in the posterior aspect of the pancreatic body, not well evaluated on the current exam but unchanged since the prior study dating back to 09/13/2021. Remaining visualized upper abdominal viscera within normal limits. Please refer to simultaneously acquired but separately dictated CT scan abdomen and pelvis report for additional details. Musculoskeletal: The visualized soft tissues of the chest wall are grossly unremarkable. No suspicious osseous lesions. There are mild multilevel degenerative changes in the visualized spine. Review of the MIP images confirms the above findings. IMPRESSION: 1. No evidence of pulmonary embolism. No acute intrathoracic pathology identified. 2. Short segment marked narrowing versus complete occlusion at the origin of the hypoplastic left vertebral artery, not well evaluated on the current exam. 3. Multiple other nonacute observations, as described above. Aortic Atherosclerosis (ICD10-I70.0). Electronically Signed   By: Jules Schick M.D.   On: 09/05/2022 18:41   CT ABDOMEN PELVIS W CONTRAST  Result Date: 09/05/2022 CLINICAL DATA:  Acute abdominal pain EXAM: CT ABDOMEN AND PELVIS WITH CONTRAST TECHNIQUE: Multidetector CT imaging of the abdomen and pelvis was performed using the standard protocol following bolus administration of intravenous contrast. RADIATION DOSE REDUCTION: This exam was performed according to the departmental dose-optimization program which includes automated exposure control, adjustment of the mA and/or kV according  to patient size and/or use of iterative reconstruction technique. CONTRAST:  65mL OMNIPAQUE IOHEXOL 350 MG/ML SOLN COMPARISON:  CT abdomen and pelvis 08/29/2022 FINDINGS: Lower chest: No acute abnormality. Hepatobiliary: No focal liver abnormality is seen. No gallstones, gallbladder wall thickening, or biliary dilatation. Pancreas: Cystic pancreatic lesion in the body of the pancreas measures 14 mm and appears unchanged. The pancreas is otherwise within normal limits. Spleen: Normal in size without focal abnormality. Adrenals/Urinary Tract: There is contrast in the bladder and renal collecting systems which limits evaluation for small calculi. There is no hydronephrosis or perinephric fat  stranding. There is a cyst in the superior pole of the left kidney measuring 2.1 cm. The adrenal glands and bladder are within normal limits. Stomach/Bowel: There is a small hiatal hernia. Stomach is otherwise within normal limits. Appendix appears normal. No evidence of bowel wall thickening, distention, or inflammatory changes. Vascular/Lymphatic: Aortic atherosclerosis. No enlarged abdominal or pelvic lymph nodes. Reproductive: Uterus and bilateral adnexa are unremarkable. Other: No abdominal wall hernia or abnormality. No abdominopelvic ascites. Musculoskeletal: No acute or significant osseous findings. IMPRESSION: 1. No acute localizing process in the abdomen or pelvis. 2. Small hiatal hernia. 3. Stable cystic lesion in the body of the pancreas. Recommend follow-up MRI in 2 years to confirm stability. 4. Left Bosniak I benign renal cyst measuring 2.1 cm. No follow-up imaging is recommended. JACR 2018 Feb; 264-273, Management of the Incidental Renal Mass on CT, [employer] 2021; [id number], Bosniak Classification of Cystic Renal Masses, Version 2019. Aortic Atherosclerosis (ICD10-I70.0). Electronically Signed   By: Darliss Cheney M.D.   On: 09/05/2022 18:31   CT ANGIO HEAD NECK W WO CM W PERF (CODE STROKE)  Result Date:  09/05/2022 CLINICAL DATA:  Acute stroke suspected EXAM: CT ANGIOGRAPHY HEAD AND NECK CT PERFUSION BRAIN TECHNIQUE: Multidetector CT imaging of the head and neck was performed using the standard protocol during bolus administration of intravenous contrast. Multiplanar CT image reconstructions and MIPs were obtained to evaluate the vascular anatomy. Carotid stenosis measurements (when applicable) are obtained utilizing NASCET criteria, using the distal internal carotid diameter as the denominator. Multiphase CT imaging of the brain was performed following IV bolus contrast injection. Subsequent parametric perfusion maps were calculated using RAPID software. RADIATION DOSE REDUCTION: This exam was performed according to the departmental dose-optimization program which includes automated exposure control, adjustment of the mA and/or kV according to patient size and/or use of iterative reconstruction technique. CONTRAST:  OMNIPAQUE IOHEXOL 350 MG/ML SOLN COMPARISON:  07/27/2022 FINDINGS: CTA NECK FINDINGS Aortic arch: Atheromatous wall thickening. Three vessel branching. No acute finding Right carotid system: Mild atheromatous plaque at the bifurcation without significant stenosis. No ulceration or beading Left carotid system: No significant stenosis, ulceration, or beading Vertebral arteries: No proximal subclavian stenosis. The right vertebral artery is dominant. Severe stenosis with non visible lumen at the left vertebral origin. The left vertebral artery is diffusely attenuated appearing relative to the transverse foramen size. Skeleton: No acute finding.  Occipital craniectomy. Other neck: No acute finding. Upper chest: Mildly focal emphysema.  No acute finding Review of the MIP images confirms the above findings CTA HEAD FINDINGS Anterior circulation: Atheromatous calcification along the carotid siphons. No major branch occlusion, beading, or flow reducing stenosis. Widespread intracranial atheromatous  irregularity with high-grade branch narrowing seen diffusely, affecting the ACA and MCA circulation bilaterally. Difficult arterial visualization of the left MCA bifurcation due to closely neighboring crossing vein. No aneurysm or vascular malformation Posterior circulation: Atheromatous irregularity of the right vertebral and basilar arteries without flow reducing stenosis. Tiny left vertebral artery with limited contribution to the posterior circulation. Extensive atheromatous type irregularity of the posterior cerebral arteries with flow gap at the right P2 segment and high-grade narrowings at the left P2 and P3. Venous sinuses: Unremarkable for arterial timing Anatomic variants: None significant Review of the MIP images confirms the above findings CT Brain Perfusion Findings: CBF (<30%) Volume: 3mL Perfusion (Tmax>6.0s) volume: 13mL Mismatch Volume: 10mL Infarction Location:Right parasagittal parietal lobe, site of restricted diffusion on preceding CT with core infarct underestimated compared to MRI. IMPRESSION: 1. No emergent  finding. 2. Advanced intracranial branch atherosclerosis similar to priors. Especially advanced bilateral PCA narrowing with flow gap at the right P2 segment 3. Thready flow throughout the left vertebral artery with severe stenosis or occlusion at the origin. 4. CT perfusion deficit matching brain MRI from yesterday. Electronically Signed   By: Tiburcio Pea M.D.   On: 09/05/2022 15:39   CT HEAD CODE STROKE WO CONTRAST  Result Date: 09/05/2022 CLINICAL DATA:  Code stroke.  Right-sided weakness EXAM: CT HEAD WITHOUT CONTRAST TECHNIQUE: Contiguous axial images were obtained from the base of the skull through the vertex without intravenous contrast. RADIATION DOSE REDUCTION: This exam was performed according to the departmental dose-optimization program which includes automated exposure control, adjustment of the mA and/or kV according to patient size and/or use of iterative  reconstruction technique. COMPARISON:  CT Head 09/04/22, MR Head 09/04/22 FINDINGS: Brain: No hemorrhage. No hydrocephalus. No extra-axial fluid collection. Redemonstrated right PCA territory infarct with chronic calcifications along the medial aspect of the bilateral occipital lobes. 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 Unchanged chronic calcification in the left cerebellar hemisphere. No CT evidence of an acute cortical infarct. Unchanged chronic infarct in the left parietal lobe. Unchanged size and shape of the ventricular system. Vascular: No hyperdense vessel or unexpected calcification. Skull: Prior suboccipital craniotomy. Sinuses/Orbits: No middle ear or mastoid effusion. Paranasal sinuses clear. Orbits are unremarkable. Other: None. ASPECTS [facility] Stroke Program Early CT Score): 10 when accounting for chronic findings. IMPRESSION: 1. No hemorrhage or CT evidence of an acute cortical infarct. 2. Redemonstrated right PCA territory infarct with chronic calcifications along the medial aspect of the bilateral occipital lobes. 3. 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 Findings were paged to Dr. Roda Shutters on 09/05/22 at 3:19 PM via [facility] paging system. Electronically Signed   By: Lorenza Cambridge M.D.   On: 09/05/2022 15:23     LOS: 1 day   Lanae Boast, MD [facility]  09/06/2022, 9:14 AM

## 2022-09-06 NOTE — ED Notes (Signed)
ED TO INPATIENT HANDOFF REPORT  ED Nurse Name and Phone #: Dot Lanes paramedic [phone]  S Name/Age/Gender Melissa Colon 62 y.o. female Room/Bed: 034C/034C  Code Status   Code Status: Full Code  Home/SNF/Other Home Patient oriented to: self and place Is this baseline? Yes   Triage Complete: Triage complete  Chief Complaint Acute blood loss anemia [D62]  Triage Note PT BIB EMS from [place] for stroke like symptoms.  Last known well 1315 today.   Allergies No Known Allergies  Level of Care/Admitting Diagnosis ED Disposition     ED Disposition  Admit   Condition  --   Comment  Hospital Area: [facility] [[id number]]  Level of Care: Progressive [102]  Admit to Progressive based on following criteria: GI, ENDOCRINE disease patients with GI bleeding, acute liver failure or pancreatitis, stable with diabetic ketoacidosis or thyrotoxicosis (hypothyroid) state.  May admit patient to [facility] or [facility] if equivalent level of care is available:: No  Covid Evaluation: Asymptomatic - no recent exposure (last 10 days) testing not required  Diagnosis: Acute blood loss anemia [454098]  Admitting Physician: Anselm Jungling [[id number]]  Attending Physician: Anselm Jungling [[id number]]  Certification:: I certify this patient will need inpatient services for at least 2 midnights  Expected Medical Readiness: 09/07/2022          B Medical/Surgery History Past Medical History:  Diagnosis Date   Alcohol dependence (HCC)    H/O brain tumor    Smoker    Past Surgical History:  Procedure Laterality Date   CRANIOTOMY FOR TUMOR     ESOPHAGOGASTRODUODENOSCOPY (EGD) WITH PROPOFOL N/A 08/12/2022   Procedure: ESOPHAGOGASTRODUODENOSCOPY (EGD) WITH PROPOFOL;  Surgeon: Jeani Hawking, MD;  Location: [facility] ENDOSCOPY;  Service: Gastroenterology;  Laterality: N/A;     A IV Location/Drains/Wounds Patient Lines/Drains/Airways Status     Active Line/Drains/Airways     Name  Placement date Placement time Site Days   Peripheral IV 09/05/22 18 G Left;Posterior Hand 09/05/22  --  Hand  1   Peripheral IV 09/05/22 18 G 1" Anterior;Proximal;Right Forearm 09/05/22  --  Forearm  1            Intake/Output Last 24 hours  Intake/Output Summary (Last 24 hours) at 09/06/2022 0900 Last data filed at 09/06/2022 0510 Gross per 24 hour  Intake 1955.98 ml  Output --  Net 1955.98 ml    Labs/Imaging Results for orders placed or performed during the hospital encounter of 09/05/22 (from the past 48 hour(s))  Ethanol     Status: None   Collection Time: 09/05/22  2:58 PM  Result Value Ref Range   Alcohol, Ethyl (B) <10 <10 mg/dL    Comment: (NOTE) Lowest detectable limit for serum alcohol is 10 mg/dL.  For medical purposes only. Performed at [facility], [address] [place], [place] [address]   Protime-INR     Status: None   Collection Time: 09/05/22  2:58 PM  Result Value Ref Range   Prothrombin Time 14.3 11.4 - 15.2 seconds   INR 1.1 0.8 - 1.2    Comment: (NOTE) INR goal varies based on device and disease states. Performed at [facility], [address], [place], [place] [address]   APTT     Status: None   Collection Time: 09/05/22  2:58 PM  Result Value Ref Range   aPTT 26 24 - 36 seconds    Comment: Performed at [facility], [address]  [place], [place] [address]  CBC     Status: Abnormal   Collection Time: 09/05/22  2:58 PM  Result Value Ref Range   WBC 13.9 (H) 4.0 - 10.5 K/uL   RBC 2.41 (L) 3.87 - 5.11 MIL/uL   Hemoglobin 6.1 (LL) 12.0 - 15.0 g/dL    Comment: REPEATED TO VERIFY THIS CRITICAL RESULT HAS VERIFIED AND BEEN CALLED TO Judie Petit GROSS, RN BY LEAH KLAR ON 08 21 2024 AT 1523, AND HAS BEEN READ BACK.     HCT 20.4 (L) 36.0 - 46.0 %   MCV 84.6 80.0 - 100.0 fL   MCH 25.3 (L) 26.0 - 34.0 pg   MCHC 29.9 (L) 30.0 - 36.0 g/dL   RDW 19.1 (H) 47.8 - 29.5 %   Platelets 499 (H) 150 - 400 K/uL    Comment: REPEATED TO VERIFY    nRBC 0.0 0.0 - 0.2 %    Comment: Performed at [facility], [address], [place], [place] [address]  Differential     Status: Abnormal   Collection Time: 09/05/22  2:58 PM  Result Value Ref Range   Neutrophils Relative % 83 %   Neutro Abs 11.4 (H) 1.7 - 7.7 K/uL   Lymphocytes Relative 11 %   Lymphs Abs 1.6 0.7 - 4.0 K/uL   Monocytes Relative 5 %   Monocytes Absolute 0.7 0.1 - 1.0 K/uL   Eosinophils Relative 0 %   Eosinophils Absolute 0.0 0.0 - 0.5 K/uL   Basophils Relative 0 %   Basophils Absolute 0.1 0.0 - 0.1 K/uL   Immature Granulocytes 1 %   Abs Immature Granulocytes 0.07 0.00 - 0.07 K/uL    Comment: Performed at [facility], [address], [place], [place] [address]  Comprehensive metabolic panel     Status: Abnormal   Collection Time: 09/05/22  2:58 PM  Result Value Ref Range   Sodium 139 135 - 145 mmol/L   Potassium 3.5 3.5 - 5.1 mmol/L   Chloride 110 98 - 111 mmol/L   CO2 18 (L) 22 - 32 mmol/L   Glucose, Bld 121 (H) 70 - 99 mg/dL    Comment: Glucose reference range applies only to samples taken after fasting for at least 8 hours.   BUN 23 (H) 6 - 20 mg/dL   Creatinine, Ser 4.69 0.44 - 1.00 mg/dL   Calcium 8.6 (L) 8.9 - 10.3 mg/dL   Total Protein 6.9 6.5 - 8.1 g/dL   Albumin 3.3 (L) 3.5 - 5.0 g/dL   AST 18 15 - 41 U/L   ALT 21 0 - 44 U/L   Alkaline Phosphatase 121 38 - 126 U/L   Total Bilirubin 0.6 0.3 - 1.2 mg/dL   GFR, Estimated >62 >95 mL/min    Comment: (NOTE) Calculated using the CKD-EPI Creatinine Equation (2021)    Anion gap 11 5 - 15    Comment: Performed at [facility], [address], [place], [place] [address]  CBG monitoring, ED     Status: Abnormal   Collection Time: 09/05/22  2:58 PM  Result Value Ref Range   Glucose-Capillary 106 (H) 70 - 99 mg/dL    Comment: Glucose reference range applies only to samples taken after fasting for at least 8 hours.  hCG, serum, qualitative     Status: None   Collection Time: 09/05/22   2:58 PM  Result Value Ref Range   Preg, Serum NEGATIVE NEGATIVE    Comment:  THE SENSITIVITY OF THIS METHODOLOGY IS >10 mIU/mL. Performed at [facility], [address] [place], [place] [address]   Folate     Status: None   Collection Time: 09/05/22  2:58 PM  Result Value Ref Range   Folate 34.3 >5.9 ng/mL    Comment: Performed at [facility], [address], [place], [place] [address]  Iron and TIBC     Status: Abnormal   Collection Time: 09/05/22  2:58 PM  Result Value Ref Range   Iron 20 (L) 28 - 170 ug/dL   TIBC 213 086 - 578 ug/dL   Saturation Ratios 5 (L) 10.4 - 31.8 %   UIBC 378 ug/dL    Comment: Performed at [facility], [address], [place], [place] [address]  Ferritin     Status: Abnormal   Collection Time: 09/05/22  2:58 PM  Result Value Ref Range   Ferritin 6 (L) 11 - 307 ng/mL    Comment: Performed at [facility], [address], [place], [place] [address]  Reticulocytes     Status: Abnormal   Collection Time: 09/05/22  2:58 PM  Result Value Ref Range   Retic Ct Pct 5.1 (H) 0.4 - 3.1 %   RBC. 2.44 (L) 3.87 - 5.11 MIL/uL   Retic Count, Absolute 124.4 19.0 - 186.0 K/uL   Immature Retic Fract 37.4 (H) 2.3 - 15.9 %    Comment: Performed at [facility], [address], [place], [place] [address]  Troponin I (High Sensitivity)     Status: None   Collection Time: 09/05/22  2:58 PM  Result Value Ref Range   Troponin I (High Sensitivity) 12 <18 ng/L    Comment: (NOTE) Elevated high sensitivity troponin I (hsTnI) values and significant  changes across serial measurements may suggest ACS but many other  chronic and acute conditions are known to elevate hsTnI results.  Refer to the "Links" section for chest pain algorithms and additional  guidance. Performed at [facility], [address], [place], [place] [address]   I-stat chem 8, ED     Status: Abnormal   Collection Time: 09/05/22  3:08 PM  Result Value Ref Range    Sodium 142 135 - 145 mmol/L   Potassium 3.7 3.5 - 5.1 mmol/L   Chloride 112 (H) 98 - 111 mmol/L   BUN 23 (H) 6 - 20 mg/dL   Creatinine, Ser 2.53 0.44 - 1.00 mg/dL   Glucose, Bld 664 (H) 70 - 99 mg/dL    Comment: Glucose reference range applies only to samples taken after fasting for at least 8 hours.   Calcium, Ion 1.11 (L) 1.15 - 1.40 mmol/L   TCO2 17 (L) 22 - 32 mmol/L   Hemoglobin 6.8 (LL) 12.0 - 15.0 g/dL   HCT 40.3 (L) 47.4 - 25.9 %   Comment NOTIFIED PHYSICIAN   Prepare RBC (crossmatch)     Status: None   Collection Time: 09/05/22  3:42 PM  Result Value Ref Range   Order Confirmation      ORDER PROCESSED BY BLOOD BANK Performed at [facility], [address] [place], [place] [address]   Type and screen [facility]     Status: None (Preliminary result)   Collection Time: 09/05/22  4:03 PM  Result Value Ref Range   ABO/RH(D) B POS    Antibody Screen NEG    Sample Expiration 09/08/2022,2359  Unit Number [id number]    Blood Component Type RED CELLS,LR    Unit division 00    Status of Unit ISSUED    Transfusion Status OK TO TRANSFUSE    Crossmatch Result Compatible    Unit Number [id number]    Blood Component Type RED CELLS,LR    Unit division 00    Status of Unit ISSUED    Transfusion Status OK TO TRANSFUSE    Crossmatch Result      Compatible Performed at [facility], [address] [place] [address], [place], [place] [address]   ABO/Rh     Status: None   Collection Time: 09/05/22  4:04 PM  Result Value Ref Range   ABO/RH(D)      B POS Performed at [facility], [address] [place], [place] [address]   Vitamin B12     Status: None   Collection Time: 09/05/22  5:00 PM  Result Value Ref Range   Vitamin B-12 214 180 - 914 pg/mL    Comment: (NOTE) This assay is not validated for testing neonatal or myeloproliferative syndrome specimens for Vitamin B12 levels. Performed at [facility], [address] [place],  [place] [address]   Brain natriuretic peptide     Status: None   Collection Time: 09/05/22  5:22 PM  Result Value Ref Range   B Natriuretic Peptide 53.4 0.0 - 100.0 pg/mL    Comment: Performed at [facility], [address] [place], [place] [address]  Prepare RBC (crossmatch)     Status: None   Collection Time: 09/05/22  7:51 PM  Result Value Ref Range   Order Confirmation      ORDER PROCESSED BY BLOOD BANK Performed at [facility], [address], [place], [place] [address]   Troponin I (High Sensitivity)     Status: None   Collection Time: 09/05/22  7:51 PM  Result Value Ref Range   Troponin I (High Sensitivity) 16 <18 ng/L    Comment: (NOTE) Elevated high sensitivity troponin I (hsTnI) values and significant  changes across serial measurements may suggest ACS but many other  chronic and acute conditions are known to elevate hsTnI results.  Refer to the "Links" section for chest pain algorithms and additional  guidance. Performed at [facility], [address] [place], [place] [address]   Urine rapid drug screen (hosp performed)     Status: None   Collection Time: 09/05/22  7:53 PM  Result Value Ref Range   Opiates NONE DETECTED NONE DETECTED   Cocaine NONE DETECTED NONE DETECTED   Benzodiazepines NONE DETECTED NONE DETECTED   Amphetamines NONE DETECTED NONE DETECTED   Tetrahydrocannabinol NONE DETECTED NONE DETECTED   Barbiturates NONE DETECTED NONE DETECTED    Comment: (NOTE) DRUG SCREEN FOR MEDICAL PURPOSES ONLY.  IF CONFIRMATION IS NEEDED FOR ANY PURPOSE, NOTIFY LAB WITHIN 5 DAYS.  LOWEST DETECTABLE LIMITS FOR URINE DRUG SCREEN Drug Class                     Cutoff (ng/mL) Amphetamine and metabolites    1000 Barbiturate and metabolites    200 Benzodiazepine                 200 Opiates and metabolites        300 Cocaine and metabolites        300 THC  50 Performed at [facility], [address], [place],  [place] [address]   Urinalysis, Routine w reflex microscopic -Urine, Clean Catch     Status: Abnormal   Collection Time: 09/05/22  7:54 PM  Result Value Ref Range   Color, Urine STRAW (A) YELLOW   APPearance CLEAR CLEAR   Specific Gravity, Urine >1.046 (H) 1.005 - 1.030   pH 5.0 5.0 - 8.0   Glucose, UA NEGATIVE NEGATIVE mg/dL   Hgb urine dipstick NEGATIVE NEGATIVE   Bilirubin Urine NEGATIVE NEGATIVE   Ketones, ur 5 (A) NEGATIVE mg/dL   Protein, ur NEGATIVE NEGATIVE mg/dL   Nitrite NEGATIVE NEGATIVE   Leukocytes,Ua NEGATIVE NEGATIVE    Comment: Performed at [facility], [address], [place], [place] [address]  CBG monitoring, ED     Status: None   Collection Time: 09/05/22  9:15 PM  Result Value Ref Range   Glucose-Capillary 96 70 - 99 mg/dL    Comment: Glucose reference range applies only to samples taken after fasting for at least 8 hours.  CBC     Status: Abnormal   Collection Time: 09/06/22  6:07 AM  Result Value Ref Range   WBC 9.1 4.0 - 10.5 K/uL   RBC 3.23 (L) 3.87 - 5.11 MIL/uL   Hemoglobin 9.4 (L) 12.0 - 15.0 g/dL    Comment: REPEATED TO VERIFY POST TRANSFUSION SPECIMEN    HCT 27.9 (L) 36.0 - 46.0 %   MCV 86.4 80.0 - 100.0 fL   MCH 29.1 26.0 - 34.0 pg   MCHC 33.7 30.0 - 36.0 g/dL   RDW 65.7 (H) 84.6 - 96.2 %   Platelets 309 150 - 400 K/uL   nRBC 0.0 0.0 - 0.2 %    Comment: Performed at [facility], [address], [place], [place] [address]  Basic metabolic panel     Status: Abnormal   Collection Time: 09/06/22  6:07 AM  Result Value Ref Range   Sodium 136 135 - 145 mmol/L   Potassium 3.2 (L) 3.5 - 5.1 mmol/L   Chloride 107 98 - 111 mmol/L   CO2 20 (L) 22 - 32 mmol/L   Glucose, Bld 96 70 - 99 mg/dL    Comment: Glucose reference range applies only to samples taken after fasting for at least 8 hours.   BUN 15 6 - 20 mg/dL   Creatinine, Ser 1.32 0.44 - 1.00 mg/dL   Calcium 7.8 (L) 8.9 - 10.3 mg/dL   GFR, Estimated >44 >01 mL/min    Comment:  (NOTE) Calculated using the CKD-EPI Creatinine Equation (2021)    Anion gap 9 5 - 15    Comment: Performed at [facility], [address], [place], [place] [address]   MR BRAIN WO CONTRAST  Result Date: 09/05/2022 CLINICAL DATA:  Stroke follow-up EXAM: MRI HEAD WITHOUT CONTRAST TECHNIQUE: Axial and coronal diffusion-weighted sequences of the brain and surrounding structures were obtained without intravenous contrast. COMPARISON:  09/04/2022 MRI head FINDINGS: New areas of restricted diffusion with ADC correlate adjacent to the previously noted diffusion abnormality in the medial right PCA territory, with new foci slightly more inferiorly along the medial right occipital lobe (series 5, image [id number]) and in the more lateral right occipital lobe (series 5, image 65). 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 These findings were discussed by telephone on 09/05/2022 at 11:06 pm with provider ARORA. Electronically Signed   By: Jill Side  Vasan M.D.   On: 09/05/2022 23:08   CT Angio Chest PE W and/or Wo Contrast  Result Date: 09/05/2022 CLINICAL DATA:  Pulmonary embolism (PE) suspected, high prob shortness of breath. EXAM: CT ANGIOGRAPHY CHEST WITH CONTRAST TECHNIQUE: Multidetector CT imaging of the chest was performed using the standard protocol during bolus administration of intravenous contrast. Multiplanar CT image reconstructions and MIPs were obtained to evaluate the vascular anatomy. RADIATION DOSE REDUCTION: This exam was performed according to the departmental dose-optimization program which includes automated exposure control, adjustment of the mA and/or kV according to patient size and/or use of iterative reconstruction technique. CONTRAST:  65mL OMNIPAQUE IOHEXOL 350 MG/ML SOLN COMPARISON:  None Available. FINDINGS: Cardiovascular: No evidence of embolism to the proximal subsegmental pulmonary artery level. Normal cardiac size. No pericardial effusion. No aortic  aneurysm. There are coronary artery calcifications, in keeping with coronary artery disease. There are also moderate to severe peripheral atherosclerotic vascular calcifications of thoracic aorta and its major branches. Mediastinum/Nodes: Asymmetrically enlarged and heterogeneous right thyroid lobe without discrete nodule is not well evaluated on the current exam. 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 There is short segment marked narrowing versus complete occlusion at the origin of the left vertebral artery, not well evaluated on the current exam. No solid / cystic mediastinal masses. The esophagus is nondistended precluding optimal assessment. No axillary, mediastinal or hilar lymphadenopathy by size criteria. Lungs/Pleura: The central tracheo-bronchial tree is patent. There are patchy areas of linear, plate-like atelectasis and/or scarring throughout bilateral lungs. No mass or consolidation. No pleural effusion or pneumothorax. No suspicious lung nodules. Upper Abdomen: There is a partially exophytic 2.0 x 2.4 cm cyst arising from the left kidney upper pole, posteriorly. There is a 1.6 x 1.8 cm hypoattenuating structure in the posterior aspect of the pancreatic body, not well evaluated on the current exam but unchanged since the prior study dating back to 09/13/2021. Remaining visualized upper abdominal viscera within normal limits. Please refer to simultaneously acquired but separately dictated CT scan abdomen and pelvis report for additional details. Musculoskeletal: The visualized soft tissues of the chest wall are grossly unremarkable. No suspicious osseous lesions. There are mild multilevel degenerative changes in the visualized spine. Review of the MIP images confirms the above findings. IMPRESSION: 1. No evidence of pulmonary embolism. No acute intrathoracic pathology identified. 2. Short segment marked narrowing versus complete occlusion at the origin of the hypoplastic left vertebral  artery, not well evaluated on the current exam. 3. Multiple other nonacute observations, as described above. Aortic Atherosclerosis (ICD10-I70.0). Electronically Signed   By: Jules Schick M.D.   On: 09/05/2022 18:41   CT ABDOMEN PELVIS W CONTRAST  Result Date: 09/05/2022 CLINICAL DATA:  Acute abdominal pain EXAM: CT ABDOMEN AND PELVIS WITH CONTRAST TECHNIQUE: Multidetector CT imaging of the abdomen and pelvis was performed using the standard protocol following bolus administration of intravenous contrast. RADIATION DOSE REDUCTION: This exam was performed according to the departmental dose-optimization program which includes automated exposure control, adjustment of the mA and/or kV according to patient size and/or use of iterative reconstruction technique. CONTRAST:  65mL OMNIPAQUE IOHEXOL 350 MG/ML SOLN COMPARISON:  CT abdomen and pelvis 08/29/2022 FINDINGS: Lower chest: No acute abnormality. Hepatobiliary: No focal liver abnormality is seen. No gallstones, gallbladder wall thickening, or biliary dilatation. Pancreas: Cystic pancreatic lesion in the body of the pancreas measures 14 mm and appears unchanged. The pancreas is otherwise within normal limits. Spleen: Normal in size without focal abnormality. Adrenals/Urinary Tract: There is contrast  in the bladder and renal collecting systems which limits evaluation for small calculi. There is no hydronephrosis or perinephric fat stranding. There is a cyst in the superior pole of the left kidney measuring 2.1 cm. The adrenal glands and bladder are within normal limits. Stomach/Bowel: There is a small hiatal hernia. Stomach is otherwise within normal limits. Appendix appears normal. No evidence of bowel wall thickening, distention, or inflammatory changes. Vascular/Lymphatic: Aortic atherosclerosis. No enlarged abdominal or pelvic lymph nodes. Reproductive: Uterus and bilateral adnexa are unremarkable. Other: No abdominal wall hernia or abnormality. No  abdominopelvic ascites. Musculoskeletal: No acute or significant osseous findings. IMPRESSION: 1. No acute localizing process in the abdomen or pelvis. 2. Small hiatal hernia. 3. Stable cystic lesion in the body of the pancreas. Recommend follow-up MRI in 2 years to confirm stability. 4. Left Bosniak I benign renal cyst measuring 2.1 cm. No follow-up imaging is recommended. JACR 2018 Feb; 264-273, Management of the Incidental Renal Mass on CT, [employer] 2021; [phone], Bosniak Classification of Cystic Renal Masses, Version 2019. Aortic Atherosclerosis (ICD10-I70.0). Electronically Signed   By: Darliss Cheney M.D.   On: 09/05/2022 18:31   CT ANGIO HEAD NECK W WO CM W PERF (CODE STROKE)  Result Date: 09/05/2022 CLINICAL DATA:  Acute stroke suspected EXAM: CT ANGIOGRAPHY HEAD AND NECK CT PERFUSION BRAIN TECHNIQUE: Multidetector CT imaging of the head and neck was performed using the standard protocol during bolus administration of intravenous contrast. Multiplanar CT image reconstructions and MIPs were obtained to evaluate the vascular anatomy. Carotid stenosis measurements (when applicable) are obtained utilizing NASCET criteria, using the distal internal carotid diameter as the denominator. Multiphase CT imaging of the brain was performed following IV bolus contrast injection. Subsequent parametric perfusion maps were calculated using RAPID software. RADIATION DOSE REDUCTION: This exam was performed according to the departmental dose-optimization program which includes automated exposure control, adjustment of the mA and/or kV according to patient size and/or use of iterative reconstruction technique. CONTRAST:  OMNIPAQUE IOHEXOL 350 MG/ML SOLN COMPARISON:  07/27/2022 FINDINGS: CTA NECK FINDINGS Aortic arch: Atheromatous wall thickening. Three vessel branching. No acute finding Right carotid system: Mild atheromatous plaque at the bifurcation without significant stenosis. No ulceration or beading Left  carotid system: No significant stenosis, ulceration, or beading Vertebral arteries: No proximal subclavian stenosis. The right vertebral artery is dominant. Severe stenosis with non visible lumen at the left vertebral origin. The left vertebral artery is diffusely attenuated appearing relative to the transverse foramen size. Skeleton: No acute finding.  Occipital craniectomy. Other neck: No acute finding. Upper chest: Mildly focal emphysema.  No acute finding Review of the MIP images confirms the above findings CTA HEAD FINDINGS Anterior circulation: Atheromatous calcification along the carotid siphons. No major branch occlusion, beading, or flow reducing stenosis. Widespread intracranial atheromatous irregularity with high-grade branch narrowing seen diffusely, affecting the ACA and MCA circulation bilaterally. Difficult arterial visualization of the left MCA bifurcation due to closely neighboring crossing vein. No aneurysm or vascular malformation Posterior circulation: Atheromatous irregularity of the right vertebral and basilar arteries without flow reducing stenosis. Tiny left vertebral artery with limited contribution to the posterior circulation. Extensive atheromatous type irregularity of the posterior cerebral arteries with flow gap at the right P2 segment and high-grade narrowings at the left P2 and P3. Venous sinuses: Unremarkable for arterial timing Anatomic variants: None significant Review of the MIP images confirms the above findings CT Brain Perfusion Findings: CBF (<30%) Volume: 3mL Perfusion (Tmax>6.0s) volume: 13mL Mismatch Volume: 10mL Infarction Location:Right parasagittal  parietal lobe, site of restricted diffusion on preceding CT with core infarct underestimated compared to MRI. IMPRESSION: 1. No emergent finding. 2. Advanced intracranial branch atherosclerosis similar to priors. Especially advanced bilateral PCA narrowing with flow gap at the right P2 segment 3. Thready flow throughout the  left vertebral artery with severe stenosis or occlusion at the origin. 4. CT perfusion deficit matching brain MRI from yesterday. Electronically Signed   By: Tiburcio Pea M.D.   On: 09/05/2022 15:39   CT HEAD CODE STROKE WO CONTRAST  Result Date: 09/05/2022 CLINICAL DATA:  Code stroke.  Right-sided weakness EXAM: CT HEAD WITHOUT CONTRAST TECHNIQUE: Contiguous axial images were obtained from the base of the skull through the vertex without intravenous contrast. RADIATION DOSE REDUCTION: This exam was performed according to the departmental dose-optimization program which includes automated exposure control, adjustment of the mA and/or kV according to patient size and/or use of iterative reconstruction technique. COMPARISON:  CT Head 09/04/22, MR Head 09/04/22 FINDINGS: Brain: No hemorrhage. No hydrocephalus. No extra-axial fluid collection. Redemonstrated right PCA territory infarct with chronic calcifications along the medial aspect of the bilateral occipital lobes. 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 Unchanged chronic calcification in the left cerebellar hemisphere. No CT evidence of an acute cortical infarct. Unchanged chronic infarct in the left parietal lobe. Unchanged size and shape of the ventricular system. Vascular: No hyperdense vessel or unexpected calcification. Skull: Prior suboccipital craniotomy. Sinuses/Orbits: No middle ear or mastoid effusion. Paranasal sinuses clear. Orbits are unremarkable. Other: None. ASPECTS [facility] Stroke Program Early CT Score): 10 when accounting for chronic findings. IMPRESSION: 1. No hemorrhage or CT evidence of an acute cortical infarct. 2. Redemonstrated right PCA territory infarct with chronic calcifications along the medial aspect of the bilateral occipital lobes. 3. Postsurgical changes from a prior suboccipital craniotomy with chronic encephalomalacia in the right cerebellar  hemisphere. Findings were paged to Dr. Roda Shutters on 09/05/22 at 3:19 PM via [facility] paging system. Electronically Signed   By: Lorenza Cambridge M.D.   On: 09/05/2022 15:23    Pending Labs Unresulted Labs (From admission, onward)     Start     Ordered   09/07/22 0500  Basic metabolic panel  Daily,   R      09/06/22 0738   09/07/22 0500  CBC  Daily,   R      09/06/22 0738   09/06/22 0857  Lipid panel  Add-on,   AD        09/06/22 0856   09/06/22 0857  Hemoglobin A1c  Add-on,   AD        09/06/22 0856            Vitals/Pain Today's Vitals   09/06/22 0545 09/06/22 0645 09/06/22 0654 09/06/22 0803  BP: (!) 105/50 (!) 104/56  109/61  Pulse: 80 77  78  Resp: 18 (!) 21  19  Temp:   98.9 F (37.2 C) 98.7 F (37.1 C)  TempSrc:   Oral Oral  SpO2: 98% 95%  95%  Weight:      PainSc:    0-No pain    Isolation Precautions No active isolations  Medications Medications  0.9 %  sodium chloride infusion (Manually program via Guardrails IV Fluids) ( Intravenous Not Given 09/05/22 2132)  pantoprazole (PROTONIX) injection 40 mg (has no administration in time range)  potassium chloride 10 mEq in 100 mL IVPB (10 mEq Intravenous New Bag/Given 09/06/22 0859)  iohexol (OMNIPAQUE) 350  MG/ML injection 100 mL (100 mLs Intravenous Contrast Given 09/05/22 1524)  0.9 %  sodium chloride infusion (Manually program via Guardrails IV Fluids) (0 mLs Intravenous Stopped 09/06/22 0421)  pantoprazole (PROTONIX) 80 mg /NS 100 mL IVPB (0 mg Intravenous Stopped 09/05/22 1926)  ondansetron (ZOFRAN) injection 4 mg (4 mg Intravenous Given 09/05/22 1852)  fentaNYL (SUBLIMAZE) injection 25 mcg (25 mcg Intravenous Given 09/05/22 1955)  lactated ringers bolus 500 mL (0 mLs Intravenous Stopped 09/05/22 2033)  iohexol (OMNIPAQUE) 350 MG/ML injection 65 mL (65 mLs Intravenous Contrast Given 09/05/22 1752)  lactated ringers bolus 500 mL (0 mLs Intravenous Stopped 09/05/22 2032)  iron sucrose (VENOFER) 100 mg in sodium chloride 0.9 % 100 mL  IVPB (0 mg Intravenous Stopped 09/06/22 0510)    Mobility non-ambulatory-wheelchair     Focused Assessments    R Recommendations: See Admitting Provider Note  Report given to:   Additional Notes:

## 2022-09-06 NOTE — ED Notes (Signed)
Pt called staff into room due visual fields worsening, stated by pt. Pt stated the right side of face felt different but was unable to elaborate how. It was noted pt right pupil was slightly larger than left. Doctor was inform via chat

## 2022-09-06 NOTE — Hospital Course (Addendum)
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 On EMS arrival, she was aphasic and right side was flaccid. BP of 107/77 and symptoms improved with lying down..   On 08/07/22 she had symptoms of diplopia and had MRI which showed mixed acute and subacute infarcts in the medial, inferior aspect of the right occipital lobe. Foci of petechial hemorrhage within the areas of subacute infarct. No significant mass effect. Patient's Plavix was changed to Brilinta 90 mg twice daily and would also continued low-dose aspirin. Hospital course complicated by anemia hemoglobin dipping to 11.9-8.6 with Hemoccult stool positive. Gastroenterology services consulted undergoing upper GI endoscopy 08/12/2022 per Dr. Elnoria Howard showing mucosal changes in the esophagus consistent with esophagitis. Erosive gastropathy with stigmata of recent bleeding. 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 Placed on PPI and was discharged home on 08/17/22 and continued on dual antiplatelet therapy and plans for possible outpatient colonoscopy.    She later presented to [facility] on 08/29/22 with abdominal pain thought secondary to left ureteral calculus. She received pain management in the ED while awaiting for placement to a facility. Then yesterday while in ED she reports new onset visual changes, neurology was consulted and MRI obtained demonstrated unresolved right PCA territory ischemic since last month but no significant progression. No malignant hemorrhagic transformation or mass effect. She  was then discharged home 8/20 as she was unsuccessfully placed into facility per social work.  No tPA given due to hx of recent stroke and ICH.  CT head>> with no hemorrhage or acute infarct. Redemonstrated right PCA territory infarct. CTA head and neck>> with no emergent findings. Advanced intracranial branch atherosclerosis similar to prior.   CTA chest>> negative for pulmonary embolism.  CT abd/pelvis >>with no acute findings.  labs >> worsening anemia Hgb of 6.1 down from baseline of 7-8. Rectal exam per ED physician revealed gross melena. 2 units of pRBC was ordered.  Neurology consulted MRI ordered, patient was admitted for further management  MRI  brain>> new areas of acute infarct in the medial right PCA territory

## 2022-09-06 NOTE — ED Notes (Signed)
IV team at bedside 

## 2022-09-07 DIAGNOSIS — D62 Acute posthemorrhagic anemia: Secondary | ICD-10-CM | POA: Diagnosis not present

## 2022-09-07 DIAGNOSIS — K921 Melena: Secondary | ICD-10-CM

## 2022-09-07 DIAGNOSIS — I639 Cerebral infarction, unspecified: Secondary | ICD-10-CM | POA: Diagnosis not present

## 2022-09-07 DIAGNOSIS — K862 Cyst of pancreas: Secondary | ICD-10-CM | POA: Diagnosis not present

## 2022-09-07 LAB — CBC
HCT: 29.4 % — ABNORMAL LOW (ref 36.0–46.0)
Hemoglobin: 9.3 g/dL — ABNORMAL LOW (ref 12.0–15.0)
MCH: 27.4 pg (ref 26.0–34.0)
MCHC: 31.6 g/dL (ref 30.0–36.0)
MCV: 86.7 fL (ref 80.0–100.0)
Platelets: 299 10*3/uL (ref 150–400)
RBC: 3.39 MIL/uL — ABNORMAL LOW (ref 3.87–5.11)
RDW: 16.1 % — ABNORMAL HIGH (ref 11.5–15.5)
WBC: 8.5 10*3/uL (ref 4.0–10.5)
nRBC: 0 % (ref 0.0–0.2)

## 2022-09-07 LAB — TYPE AND SCREEN
ABO/RH(D): B POS
Antibody Screen: NEGATIVE
Unit division: 0
Unit division: 0

## 2022-09-07 LAB — BASIC METABOLIC PANEL
Anion gap: 8 (ref 5–15)
BUN: 6 mg/dL (ref 6–20)
CO2: 21 mmol/L — ABNORMAL LOW (ref 22–32)
Calcium: 8 mg/dL — ABNORMAL LOW (ref 8.9–10.3)
Chloride: 112 mmol/L — ABNORMAL HIGH (ref 98–111)
Creatinine, Ser: 0.53 mg/dL (ref 0.44–1.00)
GFR, Estimated: >60 mL/min (ref >60)
Glucose, Bld: 78 mg/dL (ref 70–99)
Potassium: 3.3 mmol/L — ABNORMAL LOW (ref 3.5–5.1)
Sodium: 141 mmol/L (ref 135–145)

## 2022-09-07 LAB — BPAM RBC
Blood Product Expiration Date: 202409162359
Blood Product Expiration Date: 202409162359
ISSUE DATE / TIME: 202408212112
ISSUE DATE / TIME: 202408220104
Unit Type and Rh: 7300
Unit Type and Rh: 7300

## 2022-09-07 MED ORDER — ONDANSETRON HCL 4 MG/2ML IJ SOLN: 4.0000 mg | Freq: Four times a day (QID) | INTRAMUSCULAR | Status: DC | PRN

## 2022-09-07 MED ORDER — ACETAMINOPHEN 325 MG PO TABS: 650.0000 mg | ORAL_TABLET | Freq: Four times a day (QID) | ORAL | Status: DC | PRN

## 2022-09-07 MED ORDER — LORAZEPAM 0.5 MG PO TABS
0.5000 mg | ORAL_TABLET | Freq: Two times a day (BID) | ORAL | Status: DC | PRN
  Administered 2022-09-07 – 2022-09-12 (×5): 0.5 mg via ORAL
  Filled 2022-09-07 (×5): qty 1

## 2022-09-07 MED ORDER — POLYETHYLENE GLYCOL 3350 17 G PO PACK: 17.0000 g | PACK | Freq: Every day | ORAL | Status: DC | PRN

## 2022-09-07 MED ORDER — ATORVASTATIN CALCIUM 80 MG PO TABS
80.0000 mg | ORAL_TABLET | Freq: Every day | ORAL | Status: DC
  Administered 2022-09-07 – 2022-09-13 (×7): 80 mg via ORAL
  Filled 2022-09-07 (×7): qty 1

## 2022-09-07 MED ORDER — ALBUTEROL SULFATE (2.5 MG/3ML) 0.083% IN NEBU: 2.5000 mg | INHALATION_SOLUTION | RESPIRATORY_TRACT | Status: DC | PRN

## 2022-09-07 NOTE — Progress Notes (Signed)
SLP Cancellation Note  Patient Details Name: Melissa Colon MRN: [id number] DOB: Jul 28, 1960   Cancelled treatment:       Reason Eval/Treat Not Completed: SLP screened - per chart, pt passed the [facility] swallow screen 8/21. Discussed with RN who says that she passed the screen again with him. SLP to defer swallow eval per protocol. Recommend MD consider ordering SLP cognitive-linguistic eval in light of new areas of acute infarct on MRI.     Mahala Menghini., M.A. CCC-SLP Acute Rehabilitation Services Office [phone]  Secure chat preferred  09/07/2022, 9:05 AM

## 2022-09-07 NOTE — Progress Notes (Addendum)
Progress Note   Subjective  Chief Complaint: Anemia with dark stool  When I saw the patient today she is having a really hard time tearful and anxious and tells me she is scared because things that she is seeing are not real.  She tells me that for example when I sit in the chair all of a sudden I am gigantic, she keeps seeing various symptoms like this in the room and is very scared about it.  No documented bowel movements overnight.   Objective   Vital signs in last 24 hours: Temp:  [97.6 F (36.4 C)-98.6 F (37 C)] 97.6 F (36.4 C) (08/23 1236) Pulse Rate:  [68-99] 87 (08/23 1236) Resp:  [14-27] 20 (08/23 0822) BP: (103-145)/(50-77) 132/77 (08/23 1236) SpO2:  [90 %-99 %] 96 % (08/23 0822) Last BM Date : 09/04/22 General:    white female in mild distress Heart:  Regular rate and rhythm; no murmurs Lungs: Respirations even and unlabored, lungs CTA bilaterally Abdomen:  Soft, nontender and nondistended. Normal bowel sounds. Psych: Anxious, tearful  Intake/Output from previous day: 08/22 0701 - 08/23 0700 In: -  Out: 550 [Urine:550]  Lab Results: Recent Labs    09/05/22 1458 09/05/22 1508 09/06/22 0607 09/07/22 0633  WBC 13.9*  --  9.1 8.5  HGB 6.1* 6.8* 9.4* 9.3*  HCT 20.4* 20.0* 27.9* 29.4*  PLT 499*  --  309 299   BMET Recent Labs    09/05/22 1458 09/05/22 1508 09/06/22 0607 09/07/22 0633  NA 139 142 136 141  K 3.5 3.7 3.2* 3.3*  CL 110 112* 107 112*  CO2 18*  --  20* 21*  GLUCOSE 121* 114* 96 78  BUN 23* 23* 15 6  CREATININE 0.79 0.70 0.61 0.53  CALCIUM 8.6*  --  7.8* 8.0*   LFT Recent Labs    09/05/22 1458  PROT 6.9  ALBUMIN 3.3*  AST 18  ALT 21  ALKPHOS 121  BILITOT 0.6   PT/INR Recent Labs    09/05/22 1458  LABPROT 14.3  INR 1.1    Studies/Results: MR BRAIN WO CONTRAST  Result Date: 09/05/2022 CLINICAL DATA:  Stroke follow-up EXAM: MRI HEAD WITHOUT CONTRAST TECHNIQUE: Axial and coronal diffusion-weighted sequences of the  brain and surrounding structures were obtained without intravenous contrast. COMPARISON:  09/04/2022 MRI head FINDINGS: New areas of restricted diffusion with ADC correlate adjacent to the previously noted diffusion abnormality in the medial right PCA territory, with new foci slightly more inferiorly along the medial right occipital lobe (series 5, image [id number]) and in the more lateral right occipital lobe (series 5, image 65). 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 These findings were discussed by telephone on 09/05/2022 at 11:06 pm with provider ARORA. Electronically Signed   By: Wiliam Ke M.D.   On: 09/05/2022 23:08   CT Angio Chest PE W and/or Wo Contrast  Result Date: 09/05/2022 CLINICAL DATA:  Pulmonary embolism (PE) suspected, high prob shortness of breath. EXAM: CT ANGIOGRAPHY CHEST WITH CONTRAST TECHNIQUE: Multidetector CT imaging of the chest was performed using the standard protocol during bolus administration of intravenous contrast. Multiplanar CT image reconstructions and MIPs were obtained to evaluate the vascular anatomy. RADIATION DOSE REDUCTION: This exam was performed according to the departmental dose-optimization program which includes automated exposure control, adjustment of the mA and/or kV according to patient size and/or use of iterative reconstruction technique. CONTRAST:  65mL OMNIPAQUE IOHEXOL 350 MG/ML SOLN COMPARISON:  None Available.  FINDINGS: Cardiovascular: No evidence of embolism to the proximal subsegmental pulmonary artery level. Normal cardiac size. No pericardial effusion. No aortic aneurysm. There are coronary artery calcifications, in keeping with coronary artery disease. There are also moderate to severe peripheral atherosclerotic vascular calcifications of thoracic aorta and its major branches. Mediastinum/Nodes: Asymmetrically enlarged and heterogeneous right thyroid lobe without discrete nodule is not well evaluated on the current  exam. 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 There is short segment marked narrowing versus complete occlusion at the origin of the left vertebral artery, not well evaluated on the current exam. No solid / cystic mediastinal masses. The esophagus is nondistended precluding optimal assessment. No axillary, mediastinal or hilar lymphadenopathy by size criteria. Lungs/Pleura: The central tracheo-bronchial tree is patent. There are patchy areas of linear, plate-like atelectasis and/or scarring throughout bilateral lungs. No mass or consolidation. No pleural effusion or pneumothorax. No suspicious lung nodules. Upper Abdomen: There is a partially exophytic 2.0 x 2.4 cm cyst arising from the left kidney upper pole, posteriorly. There is a 1.6 x 1.8 cm hypoattenuating structure in the posterior aspect of the pancreatic body, not well evaluated on the current exam but unchanged since the prior study dating back to 09/13/2021. Remaining visualized upper abdominal viscera within normal limits. Please refer to simultaneously acquired but separately dictated CT scan abdomen and pelvis report for additional details. Musculoskeletal: The visualized soft tissues of the chest wall are grossly unremarkable. No suspicious osseous lesions. There are mild multilevel degenerative changes in the visualized spine. Review of the MIP images confirms the above findings. IMPRESSION: 1. No evidence of pulmonary embolism. No acute intrathoracic pathology identified. 2. Short segment marked narrowing versus complete occlusion at the origin of the hypoplastic left vertebral artery, not well evaluated on the current exam. 3. Multiple other nonacute observations, as described above. Aortic Atherosclerosis (ICD10-I70.0). Electronically Signed   By: Jules Schick M.D.   On: 09/05/2022 18:41   CT ABDOMEN PELVIS W CONTRAST  Result Date: 09/05/2022 CLINICAL DATA:  Acute abdominal pain EXAM: CT ABDOMEN AND PELVIS WITH CONTRAST TECHNIQUE:  Multidetector CT imaging of the abdomen and pelvis was performed using the standard protocol following bolus administration of intravenous contrast. RADIATION DOSE REDUCTION: This exam was performed according to the departmental dose-optimization program which includes automated exposure control, adjustment of the mA and/or kV according to patient size and/or use of iterative reconstruction technique. CONTRAST:  65mL OMNIPAQUE IOHEXOL 350 MG/ML SOLN COMPARISON:  CT abdomen and pelvis 08/29/2022 FINDINGS: Lower chest: No acute abnormality. Hepatobiliary: No focal liver abnormality is seen. No gallstones, gallbladder wall thickening, or biliary dilatation. Pancreas: Cystic pancreatic lesion in the body of the pancreas measures 14 mm and appears unchanged. The pancreas is otherwise within normal limits. Spleen: Normal in size without focal abnormality. Adrenals/Urinary Tract: There is contrast in the bladder and renal collecting systems which limits evaluation for small calculi. There is no hydronephrosis or perinephric fat stranding. There is a cyst in the superior pole of the left kidney measuring 2.1 cm. The adrenal glands and bladder are within normal limits. Stomach/Bowel: There is a small hiatal hernia. Stomach is otherwise within normal limits. Appendix appears normal. No evidence of bowel wall thickening, distention, or inflammatory changes. Vascular/Lymphatic: Aortic atherosclerosis. No enlarged abdominal or pelvic lymph nodes. Reproductive: Uterus and bilateral adnexa are unremarkable. Other: No abdominal wall hernia or abnormality. No abdominopelvic ascites. Musculoskeletal: No acute or significant osseous findings. IMPRESSION: 1. No acute localizing process in the abdomen or pelvis. 2.  Small hiatal hernia. 3. Stable cystic lesion in the body of the pancreas. Recommend follow-up MRI in 2 years to confirm stability. 4. Left Bosniak I benign renal cyst measuring 2.1 cm. No follow-up imaging is recommended.  JACR 2018 Feb; 264-273, Management of the Incidental Renal Mass on CT, [employer] 2021; 814-848, Bosniak Classification of Cystic Renal Masses, Version 2019. Aortic Atherosclerosis (ICD10-I70.0). Electronically Signed   By: Darliss Cheney M.D.   On: 09/05/2022 18:31   CT ANGIO HEAD NECK W WO CM W PERF (CODE STROKE)  Result Date: 09/05/2022 CLINICAL DATA:  Acute stroke suspected EXAM: CT ANGIOGRAPHY HEAD AND NECK CT PERFUSION BRAIN TECHNIQUE: Multidetector CT imaging of the head and neck was performed using the standard protocol during bolus administration of intravenous contrast. Multiplanar CT image reconstructions and MIPs were obtained to evaluate the vascular anatomy. Carotid stenosis measurements (when applicable) are obtained utilizing NASCET criteria, using the distal internal carotid diameter as the denominator. Multiphase CT imaging of the brain was performed following IV bolus contrast injection. Subsequent parametric perfusion maps were calculated using RAPID software. RADIATION DOSE REDUCTION: This exam was performed according to the departmental dose-optimization program which includes automated exposure control, adjustment of the mA and/or kV according to patient size and/or use of iterative reconstruction technique. CONTRAST:  OMNIPAQUE IOHEXOL 350 MG/ML SOLN COMPARISON:  07/27/2022 FINDINGS: CTA NECK FINDINGS Aortic arch: Atheromatous wall thickening. Three vessel branching. No acute finding Right carotid system: Mild atheromatous plaque at the bifurcation without significant stenosis. No ulceration or beading Left carotid system: No significant stenosis, ulceration, or beading Vertebral arteries: No proximal subclavian stenosis. The right vertebral artery is dominant. Severe stenosis with non visible lumen at the left vertebral origin. The left vertebral artery is diffusely attenuated appearing relative to the transverse foramen size. Skeleton: No acute finding.  Occipital craniectomy.  Other neck: No acute finding. Upper chest: Mildly focal emphysema.  No acute finding Review of the MIP images confirms the above findings CTA HEAD FINDINGS Anterior circulation: Atheromatous calcification along the carotid siphons. No major branch occlusion, beading, or flow reducing stenosis. Widespread intracranial atheromatous irregularity with high-grade branch narrowing seen diffusely, affecting the ACA and MCA circulation bilaterally. Difficult arterial visualization of the left MCA bifurcation due to closely neighboring crossing vein. No aneurysm or vascular malformation Posterior circulation: Atheromatous irregularity of the right vertebral and basilar arteries without flow reducing stenosis. Tiny left vertebral artery with limited contribution to the posterior circulation. Extensive atheromatous type irregularity of the posterior cerebral arteries with flow gap at the right P2 segment and high-grade narrowings at the left P2 and P3. Venous sinuses: Unremarkable for arterial timing Anatomic variants: None significant Review of the MIP images confirms the above findings CT Brain Perfusion Findings: CBF (<30%) Volume: 3mL Perfusion (Tmax>6.0s) volume: 13mL Mismatch Volume: 10mL Infarction Location:Right parasagittal parietal lobe, site of restricted diffusion on preceding CT with core infarct underestimated compared to MRI. IMPRESSION: 1. No emergent finding. 2. Advanced intracranial branch atherosclerosis similar to priors. Especially advanced bilateral PCA narrowing with flow gap at the right P2 segment 3. Thready flow throughout the left vertebral artery with severe stenosis or occlusion at the origin. 4. CT perfusion deficit matching brain MRI from yesterday. Electronically Signed   By: Tiburcio Pea M.D.   On: 09/05/2022 15:39   CT HEAD CODE STROKE WO CONTRAST  Result Date: 09/05/2022 CLINICAL DATA:  Code stroke.  Right-sided weakness EXAM: CT HEAD WITHOUT CONTRAST TECHNIQUE: Contiguous axial images  were obtained from the base of  the skull through the vertex without intravenous contrast. RADIATION DOSE REDUCTION: This exam was performed according to the departmental dose-optimization program which includes automated exposure control, adjustment of the mA and/or kV according to patient size and/or use of iterative reconstruction technique. COMPARISON:  CT Head 09/04/22, MR Head 09/04/22 FINDINGS: Brain: No hemorrhage. No hydrocephalus. No extra-axial fluid collection. Redemonstrated right PCA territory infarct with chronic calcifications along the medial aspect of the bilateral occipital lobes. 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 Unchanged chronic calcification in the left cerebellar hemisphere. No CT evidence of an acute cortical infarct. Unchanged chronic infarct in the left parietal lobe. Unchanged size and shape of the ventricular system. Vascular: No hyperdense vessel or unexpected calcification. Skull: Prior suboccipital craniotomy. Sinuses/Orbits: No middle ear or mastoid effusion. Paranasal sinuses clear. Orbits are unremarkable. Other: None. ASPECTS [facility] Stroke Program Early CT Score): 10 when accounting for chronic findings. IMPRESSION: 1. No hemorrhage or CT evidence of an acute cortical infarct. 2. Redemonstrated right PCA territory infarct with chronic calcifications along the medial aspect of the bilateral occipital lobes. 3. 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 Findings were paged to Dr. Roda Shutters on 09/05/22 at 3:19 PM via [facility] paging system. Electronically Signed   By: Lorenza Cambridge M.D.   On: 09/05/2022 15:23       Assessment / Plan:   Assessment: 1.  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  History of right ischemic stroke in July: On Aspirin and Brilinta-currently on hold given active GI bleed-last dose 8/21 3.  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  Altered mental status/delirium: Patient currently seeing things that are not there  Plan: 1.  Again eventual enteroscopy +/- colonoscopy.  Would like to allow her a few more days off of Brilinta and possibly to get over this acute altered mental status.  Does not appear she has had any further acute bleeding overnight.  Hemoglobin stable after transfusion. 2.  Continue monitor hemoglobin and transfusion as needed less than 7 3.  Agree with Pantoprazole 40 mg IV twice daily 4.  Patient can have her regular diet, not sure what this is given her stroke, will allow hospitalist team to decide +/- speech path or after swallow eval. 5.  Continue to hold Brilinta. 6.  We will follow peripherally tomorrow.  Please call us with any acute concerns.  Otherwise we will likely see the patient back on 09/09/2022 to determine timing of procedures. 7.  Alerted nursing staff the patient would do better with a sitter in the room.  Also alerted hospitalist to altered mental status/visual hallucinations.  Thank you for your kind consultation.    LOS: 2 days   Unk Lightning  09/07/2022, 12:45 PM  I have taken an interval history, thoroughly reviewed the chart and examined the patient. I agree with the Advanced Practitioner's note, impression and recommendations, and have recorded additional findings, impressions and recommendations below. I performed a substantive portion of this encounter (>50% time spent), including a complete performance of the medical decision making.  My additional thoughts are as follows:   No overt GI bleeding  at present.   Hemoglobin stable after transfusion.  Mental status abnormal (?  Chronicity).  I recommendation is for small bowel enteroscopy and colonoscopy during this admission, timing to be determined based on her overall mental status as well as conversations with family for agreement on the plan of procedural consent.  We are also allowing more time for antiplatelet effect of her medicines to washout prior to endoscopic procedures.   Charlie Pitter III [phone]

## 2022-09-07 NOTE — Plan of Care (Signed)

## 2022-09-07 NOTE — Progress Notes (Signed)
PROGRESS NOTE        PATIENT DETAILS Name: Melissa Colon Age: 62 y.o. Sex: female Date of Birth: [birth year] Admit Date: 09/05/2022 Admitting Physician Anselm Jungling, DO PCP:Pcp, No  Brief Summary: Patient is a 62 y.o.  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  She was noted to have melanotic stools.  Upon further evaluation she was found to have hemoglobin of 6.1, MRI was positive for CVA.  She was then admitted to the hospitalist service.   Significant events: 7/12-7/14 hospitalization for acute right MCA territory infarct.  Discharged to [facility]. 7/16-8/2>> discharged from CIR-following CVA 8/14-8/18>> [facility] ED-abdominal pain/constipation-boarded in [facility] ED as family voiced concern that they were unable to take care of her.  Unfortunately-unable to be placed to SNF-sent home with home health. 8/21>> admit to [facility]-presented to ED with stroke symptoms/GI bleed/severe anemia  Significant studies: 7/13>> echo: EF 65-70%, grade 1 diastolic dysfunction 8/21>> MRI brain: Acute infarct right PCA territory 8/21>> CT angio head/neck: Intracranial branch atherosclerosis similar to prior studies-advanced bilateral PCA narrowing. 8/21>> CT angio chest: No PE. 8/21>> CT abdomen/pelvis: No acute abnormality. 8/22>> A1c: 5.4 8/22>> LDL: 30 open  Significant microbiology data: None  Procedures: None  Consults: GI Neurology  Subjective: Lying comfortably in bed-denies any chest pain or shortness of breath.  Objective: Vitals: Blood pressure 118/61, pulse 89, temperature 98.2 F (36.8 C), temperature source Oral, resp. rate 20, weight 70.2 kg, SpO2 96%.   Exam: Gen Exam:Alert awake-not in any distress HEENT:atraumatic, normocephalic Chest: B/L clear to  auscultation anteriorly CVS:S1S2 regular Abdomen:soft non tender, non distended Extremities:no edema Neurology: Left upper extremity approximately 3/5-much weaker distally.  Rest of the extremities around 5/5. Skin: no rash  Pertinent Labs/Radiology:    Latest Ref Rng & Units 09/07/2022    6:33 AM 09/06/2022    6:07 AM 09/05/2022    3:08 PM  CBC  WBC 4.0 - 10.5 K/uL 8.5  9.1    Hemoglobin 12.0 - 15.0 g/dL 9.3  9.4  6.8   Hematocrit 36.0 - 46.0 % 29.4  27.9  20.0   Platelets 150 - 400 K/uL 299  309      Lab Results  Component Value Date   NA 141 09/07/2022   K 3.3 (L) 09/07/2022   CL 112 (H) 09/07/2022   CO2 21 (L) 09/07/2022      Assessment/Plan: Acute blood loss anemia secondary to presumed upper GI bleeding Likely secondary to AVMs (seen on prior EGD in July 2024) in the setting of DAPT use for recent CVA No further melanotic stools overnight PPI twice daily Hb stable S/p 2 units of PRBC on admit GI waiting for Brilinta washout before EGD.  Acute right PCA territory infarct Recent history of right MCA territory in July 2024 infarct on DAPT History of ICH versus hemorrhagic conversion 2021 Acute infarct felt to be due to hypotension/acute blood loss-overall improved following blood transfusion. Antiplatelets on hold due to active GI bleeding Workup as above for CVA No further recommendations from neurology-about to resume either ASA or Plavix monotherapy when cleared by GI. Await further recommendations from PT/OT and SLP.  HTN BP stable Not on any antihypertensives  HLD Resume  statin when oral intake resumes.  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   Weight as of this encounter: 70.2 kg.   Code status:   Code Status: Full Code   DVT Prophylaxis: SCDs Start: 09/05/22 2309   Family Communication: None at bedside   Disposition Plan: Status is: Inpatient Remains inpatient appropriate because: Severity of illness   Planned Discharge Destination:Skilled nursing facility   Diet: Diet Order             Diet NPO time specified  Diet effective now                     Antimicrobial agents: Anti-infectives (From admission, onward)    None        MEDICATIONS: Scheduled Meds:  sodium chloride   Intravenous Once   pantoprazole (PROTONIX) IV  40 mg Intravenous Q12H   Continuous Infusions: PRN Meds:.   I have personally reviewed following labs and imaging studies  LABORATORY DATA: CBC: Recent Labs  Lab 09/05/22 1458 09/05/22 1508 09/06/22 0607 09/07/22 0633  WBC 13.9*  --  9.1 8.5  NEUTROABS 11.4*  --   --   --   HGB 6.1* 6.8* 9.4* 9.3*  HCT 20.4* 20.0* 27.9* 29.4*  MCV 84.6  --  86.4 86.7  PLT 499*  --  309 299    Basic Metabolic Panel: Recent Labs  Lab 09/05/22 1458 09/05/22 1508 09/06/22 0607 09/07/22 0633  NA 139 142 136 141  K 3.5 3.7 3.2* 3.3*  CL 110 112* 107 112*  CO2 18*  --  20* 21*  GLUCOSE 121* 114* 96 78  BUN 23* 23* 15 6  CREATININE 0.79 0.70 0.61 0.53  CALCIUM 8.6*  --  7.8* 8.0*    GFR: Estimated Creatinine Clearance: 68.6 mL/min (by C-G formula based on SCr of 0.53 mg/dL).  Liver Function Tests: Recent Labs  Lab 09/05/22 1458  AST 18  ALT 21  ALKPHOS 121  BILITOT 0.6  PROT 6.9  ALBUMIN 3.3*   No results for input(s): "LIPASE", "AMYLASE" in the last 168 hours. No results for input(s): "AMMONIA" in the last 168 hours.  Coagulation Profile: Recent Labs  Lab 09/05/22 1458  INR 1.1    Cardiac Enzymes: No results for input(s): "CKTOTAL", "CKMB", "CKMBINDEX", "TROPONINI" in the last 168 hours.  BNP (last 3 results) No results for  input(s): "PROBNP" in the last 8760 hours.  Lipid Profile: Recent Labs    09/06/22 0605  CHOL 84  HDL 27*  LDLCALC 37  TRIG 604  CHOLHDL 3.1    Thyroid Function Tests: No results for input(s): "TSH", "T4TOTAL", "FREET4", "T3FREE", "THYROIDAB" in the last 72 hours.  Anemia Panel: Recent Labs    09/05/22 1458 09/05/22 1700  VITAMINB12  --  214  FOLATE 34.3  --   FERRITIN 6*  --   TIBC 398  --   IRON 20*  --   RETICCTPCT 5.1*  --     Urine analysis:    Component Value Date/Time   COLORURINE STRAW (A) 09/05/2022 1954   APPEARANCEUR CLEAR 09/05/2022 1954   LABSPEC >1.046 (H) 09/05/2022 1954  PHURINE 5.0 09/05/2022 1954   GLUCOSEU NEGATIVE 09/05/2022 1954   HGBUR NEGATIVE 09/05/2022 1954   BILIRUBINUR NEGATIVE 09/05/2022 1954   KETONESUR 5 (A) 09/05/2022 1954   PROTEINUR NEGATIVE 09/05/2022 1954   NITRITE NEGATIVE 09/05/2022 1954   LEUKOCYTESUR NEGATIVE 09/05/2022 1954    Sepsis Labs: Lactic Acid, Venous No results found for: "LATICACIDVEN"  MICROBIOLOGY: Recent Results (from the past 240 hour(s))  Urine Culture (for pregnant, neutropenic or urologic patients or patients with an indwelling urinary catheter)     Status: Abnormal   Collection Time: 08/29/22  5:23 PM   Specimen: Urine, Clean Catch  Result Value Ref Range Status   Specimen Description   Final    URINE, CLEAN CATCH Performed at [facility], [address], [place], [place] [address]    Special Requests   Final    NONE Performed at [facility], [address] [place], [place] [address]    Culture (A)  Final    50,000 COLONIES/mL LACTOBACILLUS SPECIES Standardized susceptibility testing for this organism is not available. Performed at [facility], [address], [place], [place] [address]    Report Status 09/02/2022 FINAL  Final    RADIOLOGY STUDIES/RESULTS: MR BRAIN WO CONTRAST  Result Date: 09/05/2022 CLINICAL DATA:  Stroke follow-up EXAM: MRI HEAD WITHOUT  CONTRAST TECHNIQUE: Axial and coronal diffusion-weighted sequences of the brain and surrounding structures were obtained without intravenous contrast. COMPARISON:  09/04/2022 MRI head FINDINGS: New areas of restricted diffusion with ADC correlate adjacent to the previously noted diffusion abnormality in the medial right PCA territory, with new foci slightly more inferiorly along the medial right occipital lobe (series 5, image [id number]) and in the more lateral right occipital lobe (series 5, image 65). 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 These findings were discussed by telephone on 09/05/2022 at 11:06 pm with provider ARORA. Electronically Signed   By: Wiliam Ke M.D.   On: 09/05/2022 23:08   CT Angio Chest PE W and/or Wo Contrast  Result Date: 09/05/2022 CLINICAL DATA:  Pulmonary embolism (PE) suspected, high prob shortness of breath. EXAM: CT ANGIOGRAPHY CHEST WITH CONTRAST TECHNIQUE: Multidetector CT imaging of the chest was performed using the standard protocol during bolus administration of intravenous contrast. Multiplanar CT image reconstructions and MIPs were obtained to evaluate the vascular anatomy. RADIATION DOSE REDUCTION: This exam was performed according to the departmental dose-optimization program which includes automated exposure control, adjustment of the mA and/or kV according to patient size and/or use of iterative reconstruction technique. CONTRAST:  65mL OMNIPAQUE IOHEXOL 350 MG/ML SOLN COMPARISON:  None Available. FINDINGS: Cardiovascular: No evidence of embolism to the proximal subsegmental pulmonary artery level. Normal cardiac size. No pericardial effusion. No aortic aneurysm. There are coronary artery calcifications, in keeping with coronary artery disease. There are also moderate to severe peripheral atherosclerotic vascular calcifications of thoracic aorta and its major branches. Mediastinum/Nodes: Asymmetrically enlarged and heterogeneous right  thyroid lobe without discrete nodule is not well evaluated on the current exam. 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 There is short segment marked narrowing versus complete occlusion at the origin of the left vertebral artery, not well evaluated on the current exam. No solid / cystic mediastinal masses. The esophagus is nondistended precluding optimal assessment. No axillary, mediastinal or hilar lymphadenopathy by size criteria. Lungs/Pleura: The central tracheo-bronchial tree is patent. There are patchy areas of linear, plate-like atelectasis and/or scarring throughout bilateral lungs. No mass or consolidation. No  pleural effusion or pneumothorax. No suspicious lung nodules. Upper Abdomen: There is a partially exophytic 2.0 x 2.4 cm cyst arising from the left kidney upper pole, posteriorly. There is a 1.6 x 1.8 cm hypoattenuating structure in the posterior aspect of the pancreatic body, not well evaluated on the current exam but unchanged since the prior study dating back to 09/13/2021. Remaining visualized upper abdominal viscera within normal limits. Please refer to simultaneously acquired but separately dictated CT scan abdomen and pelvis report for additional details. Musculoskeletal: The visualized soft tissues of the chest wall are grossly unremarkable. No suspicious osseous lesions. There are mild multilevel degenerative changes in the visualized spine. Review of the MIP images confirms the above findings. IMPRESSION: 1. No evidence of pulmonary embolism. No acute intrathoracic pathology identified. 2. Short segment marked narrowing versus complete occlusion at the origin of the hypoplastic left vertebral artery, not well evaluated on the current exam. 3. Multiple other nonacute observations, as described above. Aortic Atherosclerosis (ICD10-I70.0). Electronically Signed   By: Jules Schick M.D.   On: 09/05/2022 18:41   CT ABDOMEN PELVIS W CONTRAST  Result Date: 09/05/2022 CLINICAL DATA:   Acute abdominal pain EXAM: CT ABDOMEN AND PELVIS WITH CONTRAST TECHNIQUE: Multidetector CT imaging of the abdomen and pelvis was performed using the standard protocol following bolus administration of intravenous contrast. RADIATION DOSE REDUCTION: This exam was performed according to the departmental dose-optimization program which includes automated exposure control, adjustment of the mA and/or kV according to patient size and/or use of iterative reconstruction technique. CONTRAST:  65mL OMNIPAQUE IOHEXOL 350 MG/ML SOLN COMPARISON:  CT abdomen and pelvis 08/29/2022 FINDINGS: Lower chest: No acute abnormality. Hepatobiliary: No focal liver abnormality is seen. No gallstones, gallbladder wall thickening, or biliary dilatation. Pancreas: Cystic pancreatic lesion in the body of the pancreas measures 14 mm and appears unchanged. The pancreas is otherwise within normal limits. Spleen: Normal in size without focal abnormality. Adrenals/Urinary Tract: There is contrast in the bladder and renal collecting systems which limits evaluation for small calculi. There is no hydronephrosis or perinephric fat stranding. There is a cyst in the superior pole of the left kidney measuring 2.1 cm. The adrenal glands and bladder are within normal limits. Stomach/Bowel: There is a small hiatal hernia. Stomach is otherwise within normal limits. Appendix appears normal. No evidence of bowel wall thickening, distention, or inflammatory changes. Vascular/Lymphatic: Aortic atherosclerosis. No enlarged abdominal or pelvic lymph nodes. Reproductive: Uterus and bilateral adnexa are unremarkable. Other: No abdominal wall hernia or abnormality. No abdominopelvic ascites. Musculoskeletal: No acute or significant osseous findings. IMPRESSION: 1. No acute localizing process in the abdomen or pelvis. 2. Small hiatal hernia. 3. Stable cystic lesion in the body of the pancreas. Recommend follow-up MRI in 2 years to confirm stability. 4. Left Bosniak I  benign renal cyst measuring 2.1 cm. No follow-up imaging is recommended. JACR 2018 Feb; 264-273, Management of the Incidental Renal Mass on CT, [employer] 2021; [id number], Bosniak Classification of Cystic Renal Masses, Version 2019. Aortic Atherosclerosis (ICD10-I70.0). Electronically Signed   By: Darliss Cheney M.D.   On: 09/05/2022 18:31   CT ANGIO HEAD NECK W WO CM W PERF (CODE STROKE)  Result Date: 09/05/2022 CLINICAL DATA:  Acute stroke suspected EXAM: CT ANGIOGRAPHY HEAD AND NECK CT PERFUSION BRAIN TECHNIQUE: Multidetector CT imaging of the head and neck was performed using the standard protocol during bolus administration of intravenous contrast. Multiplanar CT image reconstructions and MIPs were obtained to evaluate the vascular anatomy. Carotid stenosis measurements (when applicable)  are obtained utilizing NASCET criteria, using the distal internal carotid diameter as the denominator. Multiphase CT imaging of the brain was performed following IV bolus contrast injection. Subsequent parametric perfusion maps were calculated using RAPID software. RADIATION DOSE REDUCTION: This exam was performed according to the departmental dose-optimization program which includes automated exposure control, adjustment of the mA and/or kV according to patient size and/or use of iterative reconstruction technique. CONTRAST:  OMNIPAQUE IOHEXOL 350 MG/ML SOLN COMPARISON:  07/27/2022 FINDINGS: CTA NECK FINDINGS Aortic arch: Atheromatous wall thickening. Three vessel branching. No acute finding Right carotid system: Mild atheromatous plaque at the bifurcation without significant stenosis. No ulceration or beading Left carotid system: No significant stenosis, ulceration, or beading Vertebral arteries: No proximal subclavian stenosis. The right vertebral artery is dominant. Severe stenosis with non visible lumen at the left vertebral origin. The left vertebral artery is diffusely attenuated appearing relative to the  transverse foramen size. Skeleton: No acute finding.  Occipital craniectomy. Other neck: No acute finding. Upper chest: Mildly focal emphysema.  No acute finding Review of the MIP images confirms the above findings CTA HEAD FINDINGS Anterior circulation: Atheromatous calcification along the carotid siphons. No major branch occlusion, beading, or flow reducing stenosis. Widespread intracranial atheromatous irregularity with high-grade branch narrowing seen diffusely, affecting the ACA and MCA circulation bilaterally. Difficult arterial visualization of the left MCA bifurcation due to closely neighboring crossing vein. No aneurysm or vascular malformation Posterior circulation: Atheromatous irregularity of the right vertebral and basilar arteries without flow reducing stenosis. Tiny left vertebral artery with limited contribution to the posterior circulation. Extensive atheromatous type irregularity of the posterior cerebral arteries with flow gap at the right P2 segment and high-grade narrowings at the left P2 and P3. Venous sinuses: Unremarkable for arterial timing Anatomic variants: None significant Review of the MIP images confirms the above findings CT Brain Perfusion Findings: CBF (<30%) Volume: 3mL Perfusion (Tmax>6.0s) volume: 13mL Mismatch Volume: 10mL Infarction Location:Right parasagittal parietal lobe, site of restricted diffusion on preceding CT with core infarct underestimated compared to MRI. IMPRESSION: 1. No emergent finding. 2. Advanced intracranial branch atherosclerosis similar to priors. Especially advanced bilateral PCA narrowing with flow gap at the right P2 segment 3. Thready flow throughout the left vertebral artery with severe stenosis or occlusion at the origin. 4. CT perfusion deficit matching brain MRI from yesterday. Electronically Signed   By: Tiburcio Pea M.D.   On: 09/05/2022 15:39   CT HEAD CODE STROKE WO CONTRAST  Result Date: 09/05/2022 CLINICAL DATA:  Code stroke.   Right-sided weakness EXAM: CT HEAD WITHOUT CONTRAST TECHNIQUE: Contiguous axial images were obtained from the base of the skull through the vertex without intravenous contrast. RADIATION DOSE REDUCTION: This exam was performed according to the departmental dose-optimization program which includes automated exposure control, adjustment of the mA and/or kV according to patient size and/or use of iterative reconstruction technique. COMPARISON:  CT Head 09/04/22, MR Head 09/04/22 FINDINGS: Brain: No hemorrhage. No hydrocephalus. No extra-axial fluid collection. Redemonstrated right PCA territory infarct with chronic calcifications along the medial aspect of the bilateral occipital lobes. 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 Unchanged chronic calcification in the left cerebellar hemisphere. No CT evidence of an acute cortical infarct. Unchanged chronic infarct in the left parietal lobe. Unchanged size and shape of the ventricular system. Vascular: No hyperdense vessel or unexpected calcification. Skull: Prior suboccipital craniotomy. Sinuses/Orbits: No middle ear or mastoid effusion. Paranasal sinuses clear. Orbits are unremarkable. Other:  None. ASPECTS ([place] Stroke Program Early CT Score): 10 when accounting for chronic findings. IMPRESSION: 1. No hemorrhage or CT evidence of an acute cortical infarct. 2. Redemonstrated right PCA territory infarct with chronic calcifications along the medial aspect of the bilateral occipital lobes. 3. 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 Findings were paged to Dr. Roda Shutters on 09/05/22 at 3:19 PM via [facility] paging system. Electronically Signed   By: Lorenza Cambridge M.D.   On: 09/05/2022 15:23     LOS: 2 days   Jeoffrey Massed, MD  [facility]    To contact the attending provider between 7A-7P or the covering  provider during after hours 7P-7A, please log into the web site [email or web address] and access using universal [place] password for that web site. If you do not have the password, please call the hospital operator.  09/07/2022, 11:24 AM

## 2022-09-08 DIAGNOSIS — I639 Cerebral infarction, unspecified: Secondary | ICD-10-CM | POA: Diagnosis not present

## 2022-09-08 DIAGNOSIS — D62 Acute posthemorrhagic anemia: Secondary | ICD-10-CM | POA: Diagnosis not present

## 2022-09-08 DIAGNOSIS — K862 Cyst of pancreas: Secondary | ICD-10-CM | POA: Diagnosis not present

## 2022-09-08 LAB — BASIC METABOLIC PANEL
Anion gap: 14 (ref 5–15)
BUN: 7 mg/dL (ref 6–20)
CO2: 18 mmol/L — ABNORMAL LOW (ref 22–32)
Calcium: 8 mg/dL — ABNORMAL LOW (ref 8.9–10.3)
Chloride: 106 mmol/L (ref 98–111)
Creatinine, Ser: 0.74 mg/dL (ref 0.44–1.00)
GFR, Estimated: >60 mL/min (ref >60)
Glucose, Bld: 75 mg/dL (ref 70–99)
Potassium: 3.2 mmol/L — ABNORMAL LOW (ref 3.5–5.1)
Sodium: 138 mmol/L (ref 135–145)

## 2022-09-08 LAB — CBC
HCT: 31.2 % — ABNORMAL LOW (ref 36.0–46.0)
Hemoglobin: 10 g/dL — ABNORMAL LOW (ref 12.0–15.0)
MCH: 28.3 pg (ref 26.0–34.0)
MCHC: 32.1 g/dL (ref 30.0–36.0)
MCV: 88.4 fL (ref 80.0–100.0)
Platelets: 351 10*3/uL (ref 150–400)
RBC: 3.53 MIL/uL — ABNORMAL LOW (ref 3.87–5.11)
RDW: 16.2 % — ABNORMAL HIGH (ref 11.5–15.5)
WBC: 8.9 10*3/uL (ref 4.0–10.5)
nRBC: 0 % (ref 0.0–0.2)

## 2022-09-08 LAB — MAGNESIUM: Magnesium: 2 mg/dL (ref 1.7–2.4)

## 2022-09-08 MED ORDER — POTASSIUM CHLORIDE CRYS ER 20 MEQ PO TBCR
40.0000 meq | EXTENDED_RELEASE_TABLET | Freq: Once | ORAL | Status: AC
  Administered 2022-09-08: 40 meq via ORAL
  Filled 2022-09-08: qty 4

## 2022-09-08 NOTE — Evaluation (Signed)
Speech Language Pathology Evaluation Patient Details Name: Melissa Colon MRN: [id number] DOB: Oct 23, 1960 Today's Date: 09/08/2022 Time: 1330-1405 SLP Time Calculation (min) (ACUTE ONLY): 35 min  Problem List:  Patient Active Problem List   Diagnosis Date Noted   Acute stroke due to ischemia (HCC) 09/06/2022   Pancreatic cyst 09/05/2022   Acute blood loss anemia 09/05/2022   Aphasia 09/05/2022   History of CVA (cerebrovascular accident) 09/05/2022   Anemia 08/06/2022   Acute ischemic right MCA stroke (HCC) 07/31/2022   Stroke (cerebrum) (HCC) 07/28/2022   Right sided weakness 07/27/2022   Protein-calorie malnutrition, severe 12/16/2019   Hypertensive emergency 12/11/2019   Alcohol dependence (HCC) 12/11/2019   Tobacco use disorder 12/11/2019   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 12/10/2019   Past Medical History:  Past Medical History:  Diagnosis Date   Alcohol dependence (HCC)    H/O brain tumor    Smoker    Past Surgical History:  Past Surgical History:  Procedure Laterality Date   CRANIOTOMY FOR TUMOR     ESOPHAGOGASTRODUODENOSCOPY (EGD) WITH PROPOFOL N/A 08/12/2022   Procedure: ESOPHAGOGASTRODUODENOSCOPY (EGD) WITH PROPOFOL;  Surgeon: Jeani Hawking, MD;  Location: [facility] ENDOSCOPY;  Service: Gastroenterology;  Laterality: N/A;   HPI:  Patietn is a 62 y.o. 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 She presented to the hospital on 09/05/2022 via [facility] as a code stroke. Upon EMS arrival she was found to be hypertensive with BP 201/117. MRI brain revealed acute infarct in medial, inferior right occipital lobe, no evidence of acute hemorrhage.   Assessment / Plan / Recommendation Clinical Impression  Patient presents with mild speech impairment and mild-moderate cognitive  impairment, however difficult to determine how far off baseline she is. She was a patient in [facility] in 2021 following a previous CVA and received PT, OT, SLP services at that time. Speech intelligibility is 90% at sentence and phrase level and is primarily impacted by flaccid right sided facial weakness. Currently, patient exhibiting cognitive impairment in the areas of awareness, memory, problem solving, orientation to time. She becomes emotionally labile when talking about being scared from her visual impairment which causes things to seem bigger than they should be or she is seeint multiple, "I see five of you". Overall, she seems apathetic but also does not seem to know why exactly she is here in the hospital. SLP recommending skilled intervention while admitted and she may benefit from SLP services at next venue of care as well.    SLP Assessment  SLP Recommendation/Assessment: Patient needs continued Speech Lanaguage Pathology Services SLP Visit Diagnosis: Cognitive communication deficit (R41.841)    Recommendations for follow up therapy are one component of a multi-disciplinary discharge planning process, led by the attending physician.  Recommendations may be updated based on patient status, additional functional criteria and insurance authorization.    Follow Up Recommendations  Other (comment) (TBD)    Assistance Recommended at Discharge  Frequent or constant Supervision/Assistance  Functional Status Assessment Patient has had a recent decline in their functional status and demonstrates the ability to make significant improvements in function in a reasonable and predictable amount of time.  Frequency and Duration min 1 x/week  2 weeks      SLP Evaluation Cognition  Overall Cognitive Status: No family/caregiver present to determine baseline cognitive functioning Arousal/Alertness: Awake/alert Orientation Level: Oriented  to person;Oriented to place;Disoriented to time;Disoriented to  situation Year: Other (Comment) ("75") Day of Week: Incorrect Attention: Sustained Focused Attention: Appears intact Memory: Impaired Memory Impairment: Storage deficit;Retrieval deficit Decreased Short Term Memory: Verbal basic;Functional basic Awareness: Impaired Awareness Impairment: Intellectual impairment Problem Solving: Impaired Problem Solving Impairment: Functional basic;Verbal basic Behaviors: Lability;Perseveration Safety/Judgment: Impaired       Comprehension  Auditory Comprehension Overall Auditory Comprehension: Appears within functional limits for tasks assessed    Expression Expression Primary Mode of Expression: Verbal Verbal Expression Overall Verbal Expression: Appears within functional limits for tasks assessed Initiation: No impairment Repetition: No impairment Naming: No impairment   Oral / Motor  Oral Motor/Sensory Function Overall Oral Motor/Sensory Function: Moderate impairment Facial ROM: Reduced left Facial Symmetry: Abnormal symmetry right Facial Strength: Reduced right Lingual ROM: Within Functional Limits Lingual Symmetry: Within Functional Limits Motor Speech Articulation: Impaired Level of Impairment: Phrase Intelligibility: Intelligibility reduced Word: 75-100% accurate Phrase: 75-100% accurate Sentence: 75-100% accurate Conversation: 75-100% accurate Motor Planning: Witnin functional limits Motor Speech Errors: Not applicable            Angela Nevin, MA, CCC-SLP Speech Therapy

## 2022-09-08 NOTE — Plan of Care (Signed)

## 2022-09-08 NOTE — Progress Notes (Signed)
PROGRESS NOTE        PATIENT DETAILS Name: Melissa Colon Age: 62 y.o. Sex: female Date of Birth: December 16, 1960 Admit Date: 09/05/2022 Admitting Physician Anselm Jungling, DO PCP:Pcp, No  Brief Summary: Patient is a 62 y.o.  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  She was noted to have melanotic stools.  Upon further evaluation she was found to have hemoglobin of 6.1, MRI was positive for CVA.  She was then admitted to the hospitalist service.   Significant events: 7/12-7/14 hospitalization for acute right MCA territory infarct.  Discharged to [facility]. 7/16-8/2>> discharged from [facility]-following CVA 8/14-8/18>> [facility] ED-abdominal pain/constipation-boarded in [facility] ED as family voiced concern that they were unable to take care of her.  Unfortunately-unable to be placed to SNF-sent home with home health. 8/21>> admit to [facility]-presented to ED with stroke symptoms/GI bleed/severe anemia  Significant studies: 7/13>> echo: EF 65-70%, grade 1 diastolic dysfunction 8/21>> MRI brain: Acute infarct right PCA territory 8/21>> CT angio head/neck: Intracranial branch atherosclerosis similar to prior studies-advanced bilateral PCA narrowing. 8/21>> CT angio chest: No PE. 8/21>> CT abdomen/pelvis: No acute abnormality. 8/22>> A1c: 5.4 8/22>> LDL: 30 open  Significant microbiology data: None  Procedures: None  Consults: GI Neurology  Subjective: No hematochezia/melena-no new complaints.  Vision unchanged.  Some mild weakness in the left upper extremity-much more distally than proximally.  Objective: Vitals: Blood pressure 132/67, pulse 78, temperature 98.1 F (36.7 C), temperature source Oral, resp. rate 16, weight 70.2 kg, SpO2 95%.   Exam: Gen Exam:Alert awake-not in  any distress HEENT:atraumatic, normocephalic Chest: B/L clear to auscultation anteriorly CVS:S1S2 regular Abdomen:soft non tender, non distended Extremities:no edema Neurology: Left upper extremity around 3/5-much weaker distally-rest of the extremities around 5/5.  She has had visual loss-and this appears unchanged from neuronote from 8/22-appears more on the left than on the right. Skin: no rash  Pertinent Labs/Radiology:    Latest Ref Rng & Units 09/08/2022    7:37 AM 09/07/2022    6:33 AM 09/06/2022    6:07 AM  CBC  WBC 4.0 - 10.5 K/uL 8.9  8.5  9.1   Hemoglobin 12.0 - 15.0 g/dL 95.2  9.3  9.4   Hematocrit 36.0 - 46.0 % 31.2  29.4  27.9   Platelets 150 - 400 K/uL 351  299  309     Lab Results  Component Value Date   NA 138 09/08/2022   K 3.2 (L) 09/08/2022   CL 106 09/08/2022   CO2 18 (L) 09/08/2022      Assessment/Plan: Acute blood loss anemia secondary to presumed upper GI bleeding Likely secondary to AVMs (seen on prior EGD in July 2024) in the setting of DAPT use for recent CVA GI bleeding seems to have resolved Continue PPI twice daily Hb stable Required 2 units of PRBC on admission-none since then. GI awaiting for Brilinta washout before performing EGD.  Likely sometime early next week.  PPI twice daily  Acute right PCA territory infarct Recent history of right MCA territory in July 2024 infarct on DAPT History of ICH versus hemorrhagic conversion 2021 Acute infarct felt to be due to hypotension/acute blood loss-overall improved following blood transfusion. Antiplatelets on hold  due to active GI bleeding Workup as above for CVA Visual deficits and left upper extremity deficits appear essentially unchanged. No further recommendations from neurology-about to resume either ASA or Plavix monotherapy when cleared by GI. Await further recommendations from PT/OT and SLP.  HTN BP stable Not on any antihypertensives  HLD Resume statin when oral intake  resumes.  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   Weight as of this encounter: 70.2 kg.   Code status:   Code Status: Full Code   DVT Prophylaxis: SCDs Start: 09/05/22 2309   Family Communication: Spouse and son at bedside on 8/23-none at bedside this morning    Disposition Plan: Status is: Inpatient Remains inpatient appropriate because: Severity of illness   Planned Discharge Destination:Skilled nursing facility   Diet: Diet Order             Diet regular Room service appropriate? No; Fluid consistency: Thin  Diet effective now                     Antimicrobial agents: Anti-infectives (From admission, onward)    None        MEDICATIONS: Scheduled Meds:  sodium chloride   Intravenous Once   atorvastatin  80 mg Oral Daily   pantoprazole (PROTONIX) IV  40 mg Intravenous Q12H   Continuous Infusions: PRN Meds:.acetaminophen, albuterol, LORazepam, ondansetron (ZOFRAN) IV, polyethylene glycol   I have personally reviewed following labs and imaging studies  LABORATORY DATA: CBC: Recent Labs  Lab 09/05/22 1458 09/05/22 1508 09/06/22 0607 09/07/22 0633 09/08/22 0737  WBC 13.9*  --  9.1 8.5 8.9  NEUTROABS 11.4*  --   --   --   --   HGB 6.1* 6.8* 9.4* 9.3* 10.0*  HCT 20.4* 20.0* 27.9* 29.4* 31.2*  MCV 84.6  --  86.4 86.7 88.4  PLT 499*  --  309 299 351    Basic Metabolic Panel: Recent Labs  Lab 09/05/22 1458 09/05/22 1508 09/06/22 0607 09/07/22 0633 09/08/22 0737  NA 139 142 136 141 138  K 3.5 3.7 3.2* 3.3* 3.2*  CL 110 112* 107 112* 106  CO2 18*  --  20* 21* 18*  GLUCOSE  121* 114* 96 78 75  BUN 23* 23* 15 6 7   CREATININE 0.79 0.70 0.61 0.53 0.74  CALCIUM 8.6*  --  7.8* 8.0* 8.0*  MG  --   --   --   --  2.0    GFR: Estimated Creatinine Clearance: 68.6 mL/min (by C-G formula based on SCr of 0.74 mg/dL).  Liver Function Tests: Recent Labs  Lab 09/05/22 1458  AST 18  ALT 21  ALKPHOS 121  BILITOT 0.6  PROT 6.9  ALBUMIN 3.3*   No results for input(s): "LIPASE", "AMYLASE" in the last 168 hours. No results for input(s): "AMMONIA" in the last 168 hours.  Coagulation Profile: Recent Labs  Lab 09/05/22 1458  INR 1.1    Cardiac Enzymes: No results for input(s): "CKTOTAL", "CKMB", "CKMBINDEX", "TROPONINI" in the last 168 hours.  BNP (last 3 results) No results for input(s): "PROBNP" in the last 8760 hours.  Lipid Profile: Recent Labs  09/06/22 0605  CHOL 84  HDL 27*  LDLCALC 37  TRIG 161  CHOLHDL 3.1    Thyroid Function Tests: No results for input(s): "TSH", "T4TOTAL", "FREET4", "T3FREE", "THYROIDAB" in the last 72 hours.  Anemia Panel: Recent Labs    09/05/22 1458 09/05/22 1700  VITAMINB12  --  214  FOLATE 34.3  --   FERRITIN 6*  --   TIBC 398  --   IRON 20*  --   RETICCTPCT 5.1*  --     Urine analysis:    Component Value Date/Time   COLORURINE STRAW (A) 09/05/2022 1954   APPEARANCEUR CLEAR 09/05/2022 1954   LABSPEC >1.046 (H) 09/05/2022 1954   PHURINE 5.0 09/05/2022 1954   GLUCOSEU NEGATIVE 09/05/2022 1954   HGBUR NEGATIVE 09/05/2022 1954   BILIRUBINUR NEGATIVE 09/05/2022 1954   KETONESUR 5 (A) 09/05/2022 1954   PROTEINUR NEGATIVE 09/05/2022 1954   NITRITE NEGATIVE 09/05/2022 1954   LEUKOCYTESUR NEGATIVE 09/05/2022 1954    Sepsis Labs: Lactic Acid, Venous No results found for: "LATICACIDVEN"  MICROBIOLOGY: Recent Results (from the past 240 hour(s))  Urine Culture (for pregnant, neutropenic or urologic patients or patients with an indwelling urinary catheter)     Status: Abnormal   Collection Time: 08/29/22   5:23 PM   Specimen: Urine, Clean Catch  Result Value Ref Range Status   Specimen Description   Final    URINE, CLEAN CATCH Performed at [facility], [address] [place], [place] [address]    Special Requests   Final    NONE Performed at [facility], [address] [place], [place] [address]    Culture (A)  Final    50,000 COLONIES/mL LACTOBACILLUS SPECIES Standardized susceptibility testing for this organism is not available. Performed at [facility], [address], [place], [place] [address]    Report Status 09/02/2022 FINAL  Final    RADIOLOGY STUDIES/RESULTS: No results found.   LOS: 3 days   Jeoffrey Massed, MD  [facility]    To contact the attending provider between 7A-7P or the covering provider during after hours 7P-7A, please log into the web site [email or web address] and access using universal [place] password for that web site. If you do not have the password, please call the hospital operator.  09/08/2022, 11:03 AM

## 2022-09-08 NOTE — Plan of Care (Signed)
  Problem: Nutrition: Goal: Adequate nutrition will be maintained Outcome: Progressing   Problem: Coping: Goal: Level of anxiety will decrease Outcome: Progressing   

## 2022-09-09 ENCOUNTER — Encounter: Payer: Self-pay | Admitting: Internal Medicine

## 2022-09-09 DIAGNOSIS — R195 Other fecal abnormalities: Secondary | ICD-10-CM

## 2022-09-09 DIAGNOSIS — D62 Acute posthemorrhagic anemia: Secondary | ICD-10-CM | POA: Diagnosis not present

## 2022-09-09 DIAGNOSIS — K921 Melena: Secondary | ICD-10-CM

## 2022-09-09 DIAGNOSIS — D500 Iron deficiency anemia secondary to blood loss (chronic): Secondary | ICD-10-CM

## 2022-09-09 DIAGNOSIS — Z7902 Long term (current) use of antithrombotics/antiplatelets: Secondary | ICD-10-CM

## 2022-09-09 LAB — CBC
HCT: 29.5 % — ABNORMAL LOW (ref 36.0–46.0)
Hemoglobin: 9.5 g/dL — ABNORMAL LOW (ref 12.0–15.0)
MCH: 28.1 pg (ref 26.0–34.0)
MCHC: 32.2 g/dL (ref 30.0–36.0)
MCV: 87.3 fL (ref 80.0–100.0)
Platelets: 336 10*3/uL (ref 150–400)
RBC: 3.38 MIL/uL — ABNORMAL LOW (ref 3.87–5.11)
RDW: 16.9 % — ABNORMAL HIGH (ref 11.5–15.5)
WBC: 7.4 10*3/uL (ref 4.0–10.5)
nRBC: 0 % (ref 0.0–0.2)

## 2022-09-09 LAB — BASIC METABOLIC PANEL
Anion gap: 9 (ref 5–15)
BUN: 9 mg/dL (ref 6–20)
CO2: 18 mmol/L — ABNORMAL LOW (ref 22–32)
Calcium: 8.1 mg/dL — ABNORMAL LOW (ref 8.9–10.3)
Chloride: 109 mmol/L (ref 98–111)
Creatinine, Ser: 0.59 mg/dL (ref 0.44–1.00)
GFR, Estimated: >60 mL/min (ref >60)
Glucose, Bld: 93 mg/dL (ref 70–99)
Potassium: 3.7 mmol/L (ref 3.5–5.1)
Sodium: 136 mmol/L (ref 135–145)

## 2022-09-09 LAB — MAGNESIUM: Magnesium: 2 mg/dL (ref 1.7–2.4)

## 2022-09-09 MED ORDER — PEG-KCL-NACL-NASULF-NA ASC-C 100 G PO SOLR: 1.0000 | Freq: Once | ORAL | Status: DC

## 2022-09-09 MED ORDER — PEG-KCL-NACL-NASULF-NA ASC-C 100 G PO SOLR
0.5000 | Freq: Once | ORAL | Status: AC
  Administered 2022-09-09: 100 g via ORAL

## 2022-09-09 MED ORDER — PEG-KCL-NACL-NASULF-NA ASC-C 100 G PO SOLR
0.5000 | Freq: Once | ORAL | Status: AC
  Administered 2022-09-09: 100 g via ORAL
  Filled 2022-09-09: qty 1

## 2022-09-09 NOTE — Progress Notes (Signed)
PROGRESS NOTE        PATIENT DETAILS Name: Melissa Colon Age: 62 y.o. Sex: female Date of Birth: [birth year] Admit Date: 09/05/2022 Admitting Physician Anselm Jungling, DO PCP:Pcp, No  Brief Summary: Patient is a 63 y.o.  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  She was noted to have melanotic stools.  Upon further evaluation she was found to have hemoglobin of 6.1, MRI was positive for CVA.  She was then admitted to the hospitalist service.   Significant events: 7/12-7/14 hospitalization for acute right MCA territory infarct.  Discharged to [facility]. 7/16-8/2>> discharged from [facility]-following CVA 8/14-8/18>> [facility] ED-abdominal pain/constipation-boarded in [facility] ED as family voiced concern that they were unable to take care of her.  Unfortunately-unable to be placed to SNF-sent home with home health. 8/21>> admit to [facility]-presented to ED with stroke symptoms/GI bleed/severe anemia  Significant studies: 7/13>> echo: EF 65-70%, grade 1 diastolic dysfunction 8/21>> MRI brain: Acute infarct right PCA territory 8/21>> CT angio head/neck: Intracranial branch atherosclerosis similar to prior studies-advanced bilateral PCA narrowing. 8/21>> CT angio chest: No PE. 8/21>> CT abdomen/pelvis: No acute abnormality. 8/22>> A1c: 5.4 8/22>> LDL: 30 open  Significant microbiology data: None  Procedures: None  Consults: GI Neurology  Subjective: No hematochezia/melena-no new complaints.  Vision unchanged.  Some mild weakness in the left upper extremity-much more distally than proximally.  Objective: Vitals: Blood pressure 126/75, pulse 88, temperature 98 F (36.7 C), resp. rate 19, weight 70.2 kg, SpO2 96%.   Exam:  Awake, does have some dysarthria, mild right-sided  facial droop, left sided weakness left arm most weak, some chronic visual disturbance, Anita.AT,PERRAL Supple Neck, No JVD,   Symmetrical Chest wall movement, Good air movement bilaterally, CTAB RRR,No Gallops, Rubs or new Murmurs,  +ve B.Sounds, Abd Soft, No tenderness,   No Cyanosis, Clubbing or edema    Pertinent Labs/Radiology:    Latest Ref Rng & Units 09/09/2022    4:50 AM 09/08/2022    7:37 AM 09/07/2022    6:33 AM  CBC  WBC 4.0 - 10.5 K/uL 7.4  8.9  8.5   Hemoglobin 12.0 - 15.0 g/dL 9.5  16.1  9.3   Hematocrit 36.0 - 46.0 % 29.5  31.2  29.4   Platelets 150 - 400 K/uL 336  351  299     Lab Results  Component Value Date   NA 136 09/09/2022   K 3.7 09/09/2022   CL 109 09/09/2022   CO2 18 (L) 09/09/2022      Assessment/Plan:  Acute blood loss anemia secondary to presumed upper GI bleeding Likely secondary to AVMs (seen on prior EGD in July 2024) in the setting of DAPT use for recent CVA GI bleeding seems to have resolved Continue PPI twice daily Hb stable Required 2 units of PRBC on admission-none since then. GI awaiting for Brilinta washout before performing EGD.  Likely sometime early next week.  PPI twice daily  Acute right PCA territory infarct this admission, likely in the setting of large vessel intracranial disease exacerbated by hypotension caused by severe blood loss anemia Recent history of right MCA territory in July 2024 infarct on DAPT History of ICH versus hemorrhagic conversion 2021 Acute  infarct felt to be due to hypotension/acute blood loss-overall improved following blood transfusion. Antiplatelets on hold due to active GI bleeding Workup as above for CVA Visual deficits and left upper extremity deficits appear essentially unchanged. No further recommendations from neurology-about to resume either ASA or Plavix monotherapy when cleared by GI. Await further recommendations from PT/OT and SLP.  HTN BP stable Not on any antihypertensives  HLD Resume  statin when oral intake resumes.  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   Weight as of this encounter: 70.2 kg.   Code status:   Code Status: Full Code   DVT Prophylaxis: SCDs Start: 09/05/22 2309   Family Communication: Spouse and son at bedside on 8/23-none at bedside this morning    Disposition Plan: Status is: Inpatient Remains inpatient appropriate because: Severity of illness   Planned Discharge Destination:Skilled nursing facility   Diet: Diet Order             Diet NPO time specified Except for: Sips with Meds, Ice Chips  Diet effective midnight           Diet clear liquid Fluid consistency: Thin  Diet effective now                     Antimicrobial agents: Anti-infectives (From admission, onward)    None        MEDICATIONS: Scheduled Meds:  sodium chloride   Intravenous Once   atorvastatin  80 mg Oral Daily   pantoprazole (PROTONIX) IV  40 mg Intravenous Q12H   peg 3350 powder  0.5 kit Oral Once   And   peg 3350 powder  0.5 kit Oral Once   Continuous Infusions: PRN Meds:.acetaminophen, albuterol, LORazepam, ondansetron (ZOFRAN) IV, polyethylene glycol   I have personally reviewed following labs and imaging studies  LABORATORY DATA:  Recent Labs  Lab 09/05/22 1458 09/05/22 1508 09/06/22 0607 09/07/22 0633 09/08/22 0737 09/09/22 0450  WBC 13.9*  --  9.1 8.5 8.9 7.4  HGB 6.1* 6.8* 9.4* 9.3* 10.0* 9.5*  HCT 20.4* 20.0* 27.9* 29.4* 31.2* 29.5*  PLT 499*  --  309 299 351 336  MCV 84.6  --  86.4 86.7 88.4 87.3  MCH 25.3*  --  29.1 27.4 28.3 28.1  MCHC 29.9*  --  33.7  31.6 32.1 32.2  RDW 15.6*  --  15.8* 16.1* 16.2* 16.9*  LYMPHSABS 1.6  --   --   --   --   --   MONOABS 0.7  --   --   --   --   --   EOSABS 0.0  --   --   --   --   --   BASOSABS 0.1  --   --   --   --   --     Recent Labs  Lab 09/05/22 1458 09/05/22 1508 09/05/22 1722 09/06/22 0605 09/06/22 0607 09/07/22 0633 09/08/22 0737 09/09/22 0450  NA 139 142  --   --  136 141 138 136  K 3.5 3.7  --   --  3.2* 3.3* 3.2* 3.7  CL 110 112*  --   --  107 112* 106 109  CO2 18*  --   --   --  20* 21* 18* 18*  ANIONGAP 11  --   --   --  9 8 14 9   GLUCOSE 121* 114*  --   --  96 78 75 93  BUN 23* 23*  --   --  15 6 7 9   CREATININE 0.79 0.70  --   --  0.61 0.53 0.74 0.59  AST 18  --   --   --   --   --   --   --   ALT 21  --   --   --   --   --   --   --   ALKPHOS 121  --   --   --   --   --   --   --   BILITOT 0.6  --   --   --   --   --   --   --   ALBUMIN 3.3*  --   --   --   --   --   --   --   INR 1.1  --   --   --   --   --   --   --   HGBA1C  --   --   --  5.4  --   --   --   --   BNP  --   --  53.4  --   --   --   --   --   MG  --   --   --   --   --   --  2.0 2.0  CALCIUM 8.6*  --   --   --  7.8* 8.0* 8.0* 8.1*    Lab Results  Component Value Date   CHOL 84 09/06/2022   HDL 27 (L) 09/06/2022   LDLCALC 37 09/06/2022   TRIG 101 09/06/2022   CHOLHDL 3.1 09/06/2022     LOS: 4 days   Signature  -    Susa Raring M.D on 09/09/2022 at 11:08 AM   -  To page go to [email or web address]

## 2022-09-09 NOTE — Plan of Care (Signed)

## 2022-09-09 NOTE — Evaluation (Signed)
Physical Therapy Evaluation Patient Details Name: Melissa Colon MRN: [id number] DOB: 03-25-1960 Today's Date: 09/09/2022  History of Present Illness  Pt is a 62 y/o F presenting from [facility] ED as code stroke, MRI brain revealin gacute infarcts in medial and inferior R occipital lobes. Also with ABLA. Recent admission in 07/2022 with CIR admission for CVA. PMH includes alcohol dependence, brain tumor  Clinical Impression  Pt is presenting below baseline. Pt was performing mod I W/C transfers and baseline. Currently pt is Min A and is unable to take steps without LOB posteriorly requiring assist to get back to sitting to prevent falls. Due to pt current functional status, home est up and available assistance at home recommending skilled physical therapy services <3 hours/day at a greater frequency and level of care in order to decrease burden of care on caregivers, improve independence, decrease risk for falls, injury and re-hospitalization. Pt HR up to 120's with standing; down to 80's in sitting.         If plan is discharge home, recommend the following: A lot of help with walking and/or transfers;A lot of help with bathing/dressing/bathroom;Assist for transportation;Help with stairs or ramp for entrance   Can travel by private vehicle   No    Equipment Recommendations None recommended by PT     Functional Status Assessment Patient has had a recent decline in their functional status and/or demonstrates limited ability to make significant improvements in function in a reasonable and predictable amount of time     Precautions / Restrictions Precautions Precautions: Fall Precaution Comments: Residual R Hemiparesis (prior CVAs) Required Braces or Orthoses: Other Brace Restrictions Weight Bearing Restrictions: No      Mobility  Bed Mobility   General bed mobility comments: pt received in recliner and returned to recliner at end of session    Transfers Overall transfer level:  Needs assistance Equipment used: Rolling walker (2 wheels) Transfers: Sit to/from Stand Sit to Stand: Mod assist, Min assist           General transfer comment: pt stood from recliner 3x with multi modal cues for hand placement and body positioning in order to decrease level of assist required. Verbal step by step cues for hand placement to stay standing.    Ambulation/Gait   Pre-gait activities: Attempt taking steps at recliner pt had significant diffculty clearing the floor and starting falling posteriorly. Assisted back down to recliner. Ataxic movement at LE with attempting to march in place.      Modified Rankin (Stroke Patients Only) Modified Rankin (Stroke Patients Only) Pre-Morbid Rankin Score: Moderately severe disability Modified Rankin: Severe disability     Balance Overall balance assessment: Needs assistance Sitting-balance support: Bilateral upper extremity supported Sitting balance-Leahy Scale: Fair   Postural control: Posterior lean Standing balance support: Bilateral upper extremity supported, During functional activity, Reliant on assistive device for balance Standing balance-Leahy Scale: Poor Standing balance comment: Pt requires physical assist externally to stay standing.         Pertinent Vitals/Pain Pain Assessment Pain Assessment: No/denies pain    Home Living Family/patient expects to be discharged to:: Private residence Living Arrangements: Spouse/significant other;Children Available Help at Discharge: Family;Available 24 hours/day Type of Home: House Home Access: Stairs to enter Entrance Stairs-Rails: Right Entrance Stairs-Number of Steps: 7   Home Layout: One level Home Equipment: Agricultural consultant (2 wheels);BSC/3in1;Cane - single point;Shower seat - built in;Hospital bed Additional Comments: Patient reports she lives with husband and son; Someone is at home 24/7 to assist  patient per patient reports    Prior Function Prior Level of  Function : Needs assist             Mobility Comments: reports she has been trasnferring to w/c at times independently and at times with help from family ADLs Comments: Pt reports she sponges bathes at baseline with assistance from Husband/Son for ADLs/IADLs     Extremity/Trunk Assessment   Upper Extremity Assessment Upper Extremity Assessment: Defer to OT evaluation RUE Deficits / Details: 2+/5 ROM/strength, keeps wrist flexed at rest, can flex/ext, decr grasp compared to LUE, pt unable to describe baseline R deficits compared to previous deficits RUE Sensation: decreased light touch;decreased proprioception RUE Coordination: decreased fine motor;decreased gross motor LUE Deficits / Details: 3+/5 ROM/strength, generally weak    Lower Extremity Assessment Lower Extremity Assessment: RLE deficits/detail RLE Deficits / Details: generalized weakness in RLE (R Hemiparesis from CVA) RLE Sensation: decreased light touch RLE Coordination: decreased gross motor LLE Deficits / Details: WFL LLE Sensation: WNL LLE Coordination: decreased gross motor    Cervical / Trunk Assessment Cervical / Trunk Assessment: Kyphotic  Communication   Communication Communication: Difficulty communicating thoughts/reduced clarity of speech Cueing Techniques: Verbal cues;Tactile cues;Visual cues  Cognition Arousal: Alert Behavior During Therapy: Flat affect Overall Cognitive Status: No family/caregiver present to determine baseline cognitive functioning Area of Impairment: Safety/judgement       Safety/Judgement: Decreased awareness of safety, Decreased awareness of deficits        General Comments General comments (skin integrity, edema, etc.): HR up to 128 on 3rd attempt at standing. Consistently got to 120's in standing. Down to 88 bpm in sitting.        Assessment/Plan    PT Assessment Patient needs continued PT services  PT Problem List Decreased strength;Decreased activity  tolerance;Decreased balance;Decreased mobility;Decreased coordination;Decreased knowledge of use of DME;Impaired sensation       PT Treatment Interventions DME instruction;Functional mobility training;Gait training;Therapeutic activities;Therapeutic exercise;Balance training;Neuromuscular re-education;Wheelchair mobility training    PT Goals (Current goals can be found in the Care Plan section)  Acute Rehab PT Goals Patient Stated Goal: To return home with spouse and son PT Goal Formulation: With patient Time For Goal Achievement: 09/23/22 Potential to Achieve Goals: Fair Additional Goals Additional Goal #1: Pt will perform 25 ft of W/C mobility at supervision level and Min A for W/C management for improved home navigation    Frequency Min 1X/week        AM-PAC PT "6 Clicks" Mobility  Outcome Measure Help needed turning from your back to your side while in a flat bed without using bedrails?: A Little Help needed moving from lying on your back to sitting on the side of a flat bed without using bedrails?: A Little Help needed moving to and from a bed to a chair (including a wheelchair)?: A Lot Help needed standing up from a chair using your arms (e.g., wheelchair or bedside chair)?: A Lot Help needed to walk in hospital room?: Total Help needed climbing 3-5 steps with a railing? : Total 6 Click Score: 12    End of Session Equipment Utilized During Treatment: Gait belt Activity Tolerance: Patient tolerated treatment well Patient left: in chair;with call bell/phone within reach;with chair alarm set Nurse Communication: Mobility status PT Visit Diagnosis: Other abnormalities of gait and mobility (R26.89);Muscle weakness (generalized) (M62.81);Hemiplegia and hemiparesis Hemiplegia - Right/Left: Right Hemiplegia - dominant/non-dominant: Dominant    Time: 8416-6063 PT Time Calculation (min) (ACUTE ONLY): 26 min   Charges:  PT Evaluation $PT Eval Low Complexity: 1 Low PT  Treatments $Therapeutic Activity: 8-22 mins PT General Charges $$ ACUTE PT VISIT: 1 Visit         Harrel Carina, DPT, CLT  Acute Rehabilitation Services Office: [phone] (Secure chat preferred)   Claudia Desanctis 09/09/2022, 11:19 AM

## 2022-09-09 NOTE — Evaluation (Signed)
Occupational Therapy Evaluation Patient Details Name: Melissa Colon MRN: [id number] DOB: 01/23/1960 Today's Date: 09/09/2022   History of Present Illness Pt is a 62 y/o F presenting from [facility] ED as code stroke, MRI brain revealin gacute infarcts in medial and inferior R occipital lobes. Also with ABLA. Recent admission in 07/2022 with CIR admission for CVA. PMH includes alcohol dependence, brain tumor   Clinical Impression   Pt questionable historian, reports transferring to w/c at home with assist from family PRN. Pt has assist for ADLs. Pt with decr cognition, visual deficits, and R >L weakness, needing mod-max A for ADLs at time of evaluation, CGA for bed mobility, and max A for pivot transfer to chair. Pt with inattention to R side during session, inconsistent visual tracking noted and pt reports seeing " many of you" that resolves with 1 eye occluded. May benefit from trial of occlusion glasses in future sessions. Pt presenting with impairments listed below, will follow acutely. Patient will benefit from continued inpatient follow up therapy, <3 hours/day to maximize safety/ind with ADLs/functional mobility.        If plan is discharge home, recommend the following: A lot of help with walking and/or transfers;A lot of help with bathing/dressing/bathroom;Assistance with cooking/housework;Assist for transportation;Help with stairs or ramp for entrance;Direct supervision/assist for medications management;Direct supervision/assist for financial management    Functional Status Assessment  Patient has had a recent decline in their functional status and demonstrates the ability to make significant improvements in function in a reasonable and predictable amount of time.  Equipment Recommendations  Other (comment) (defer)    Recommendations for Other Services PT consult     Precautions / Restrictions Precautions Precautions: Fall Precaution Comments: Residual R Hemiparesis (prior  CVAs) Restrictions Weight Bearing Restrictions: No      Mobility Bed Mobility Overal bed mobility: Needs Assistance Bed Mobility: Supine to Sit     Supine to sit: Contact guard     General bed mobility comments: cues to scoot hips to EOB    Transfers Overall transfer level: Needs assistance Equipment used: 1 person hand held assist Transfers: Sit to/from Stand, Bed to chair/wheelchair/BSC Sit to Stand: Max assist Stand pivot transfers: Max assist         General transfer comment: stands x2 from EOB, tranferred to chair, pt with difficulty sequencing steps/coordination      Balance Overall balance assessment: Needs assistance Sitting-balance support: Bilateral upper extremity supported Sitting balance-Leahy Scale: Fair Sitting balance - Comments: needs min A to sit EOB to prevent posterior lean, pt leans backward in attempts to scoot hips to EOB Postural control: Posterior lean Standing balance support: Bilateral upper extremity supported, During functional activity Standing balance-Leahy Scale: Poor                             ADL either performed or assessed with clinical judgement   ADL Overall ADL's : Needs assistance/impaired Eating/Feeding: Moderate assistance;Sitting   Grooming: Moderate assistance;Sitting   Upper Body Bathing: Moderate assistance;Sitting   Lower Body Bathing: Maximal assistance;Sitting/lateral leans   Upper Body Dressing : Moderate assistance;Sitting   Lower Body Dressing: Maximal assistance;Sitting/lateral leans   Toilet Transfer: Maximal assistance;Squat-pivot;BSC/3in1   Toileting- Clothing Manipulation and Hygiene: Total assistance       Functional mobility during ADLs: Maximal assistance       Vision Baseline Vision/History: 1 Wears glasses Ability to See in Adequate Light: 1 Impaired Patient Visual Report: Diplopia Vision Assessment?: Yes Alignment/Gaze  Preference: Chin down Tracking/Visual Pursuits:  Decreased smoothness of horizontal tracking;Decreased smoothness of vertical tracking;Other (comment) Depth Perception: Undershoots Additional Comments: has hx of double vision, but states " i see many of you", and reports double vision resolved with 1 eye occluded, needs further asseessment     Perception         Praxis Praxis: Impaired Praxis Impairment Details: Motor planning     Pertinent Vitals/Pain Pain Assessment Pain Assessment: No/denies pain     Extremity/Trunk Assessment Upper Extremity Assessment Upper Extremity Assessment: Generalized weakness;Right hand dominant;RUE deficits/detail;LUE deficits/detail RUE Deficits / Details: 2+/5 ROM/strength, keeps wrist flexed at rest, can flex/ext, decr grasp compared to LUE, pt unable to describe baseline R deficits compared to previous deficits RUE Sensation: decreased light touch;decreased proprioception RUE Coordination: decreased fine motor;decreased gross motor LUE Deficits / Details: 3+/5 ROM/strength, generally weak   Lower Extremity Assessment Lower Extremity Assessment: Defer to PT evaluation   Cervical / Trunk Assessment Cervical / Trunk Assessment: Kyphotic   Communication Communication Communication: Difficulty communicating thoughts/reduced clarity of speech Cueing Techniques: Verbal cues   Cognition Arousal: Lethargic Behavior During Therapy: Flat affect, Impulsive Overall Cognitive Status: No family/caregiver present to determine baseline cognitive functioning Area of Impairment: Problem solving, Memory                     Memory: Decreased short-term memory   Safety/Judgement: Decreased awareness of safety, Decreased awareness of deficits Awareness: Emergent Problem Solving: Slow processing, Decreased initiation, Requires verbal cues, Requires tactile cues, Difficulty sequencing General Comments: decr processing time, delayed responses, unable to recall much of home set up information, unaware of  where she is or why she is at the hospital, oriented to self and DOB     General Comments  VSS on RA    Exercises     Shoulder Instructions      Home Living Family/patient expects to be discharged to:: Private residence Living Arrangements: Spouse/significant other;Children Available Help at Discharge: Family;Available 24 hours/day Type of Home: House Home Access: Stairs to enter [employer] of Steps: 7 Entrance Stairs-Rails: Right Home Layout: One level     Bathroom Shower/Tub: Sponge bathes at baseline   Bathroom Toilet: Standard Bathroom Accessibility: Yes How Accessible: Accessible via wheelchair Home Equipment: Rolling Walker (2 wheels);BSC/3in1;Cane - single point;Shower seat - built in;Hospital bed   Additional Comments: Patient reports she lives with husband and son; Someone is at home 24/7 to assist patient per patient reports  Lives With: Spouse;Son    Prior Functioning/Environment Prior Level of Function : Needs assist             Mobility Comments: reports she has been trasnferring to w/c at times independently and at times with help from family ADLs Comments: Pt reports she sponges bathes at baseline with assistance from [facility] for ADLs/IADLs        OT Problem List: Decreased strength;Decreased activity tolerance;Decreased safety awareness;Impaired balance (sitting and/or standing);Decreased knowledge of use of DME or AE;Decreased range of motion;Decreased cognition;Decreased coordination;Impaired UE functional use      OT Treatment/Interventions: Self-care/ADL training;Therapeutic exercise;Therapeutic activities;Energy conservation;DME and/or AE instruction;Patient/family education;Balance training;Manual therapy;Cognitive remediation/compensation;Visual/perceptual remediation/compensation    OT Goals(Current goals can be found in the care plan section) Acute Rehab OT Goals Patient Stated Goal: none stated OT Goal Formulation: With  patient Time For Goal Achievement: 09/23/22 Potential to Achieve Goals: Fair ADL Goals Pt Will Perform Upper Body Dressing: with min assist;sitting Pt Will Perform Lower Body Dressing: with min  assist;sit to/from stand;sitting/lateral leans Pt Will Transfer to Toilet: with min assist;squat pivot transfer;stand pivot transfer;bedside commode Pt/caregiver will Perform Home Exercise Program: Right Upper extremity;Increased strength;Increased ROM;With minimal assist Additional ADL Goal #1: pt will follow 2 step command with min cues in prep for ADLs Additional ADL Goal #2: Pt will be able to locate items in R and L visual fields during functional task with min cues in prep for ADLs  OT Frequency: Min 1X/week    Co-evaluation              AM-PAC OT "6 Clicks" Daily Activity     Outcome Measure Help from another person eating meals?: A Lot Help from another person taking care of personal grooming?: A Lot Help from another person toileting, which includes using toliet, bedpan, or urinal?: A Lot Help from another person bathing (including washing, rinsing, drying)?: A Lot Help from another person to put on and taking off regular upper body clothing?: A Lot Help from another person to put on and taking off regular lower body clothing?: A Lot 6 Click Score: 12   End of Session Equipment Utilized During Treatment: Gait belt Nurse Communication: Mobility status  Activity Tolerance: Patient tolerated treatment well Patient left: in chair;with call bell/phone within reach;with chair alarm set  OT Visit Diagnosis: Unsteadiness on feet (R26.81);Repeated falls (R29.6);Muscle weakness (generalized) (M62.81) Hemiplegia - Right/Left: Right Hemiplegia - dominant/non-dominant: Dominant Hemiplegia - caused by: Cerebral infarction Pain - Right/Left: Right                Time: 1610-9604 OT Time Calculation (min): 26 min Charges:  OT General Charges $OT Visit: 1 Visit OT Evaluation $OT Eval  Moderate Complexity: 1 Mod OT Treatments $Therapeutic Activity: 8-22 mins  Carver Fila, OTD, OTR/L SecureChat Preferred Acute Rehab [phone]   Raimi Guillermo K Koonce 09/09/2022, 9:04 AM

## 2022-09-09 NOTE — H&P (View-Only) (Signed)
Progress Note   Subjective  Chief Complaint: Anemia with dark stool  Patient has had no further signs of GI bleed since admission, but did, initially with reports of dark stool and anemia.  Today she is slightly more clearheaded, though still tearful.  She is nervous to start a bowel prep given that she just does not want to "mess up everywhere".  She wishes for [place] to speak with her husband and/or son in regards to this procedure.  She denies any acute GI complaints or concerns.   Objective   Vital signs in last 24 hours: Temp:  [98 F (36.7 C)-98.6 F (37 C)] 98 F (36.7 C) (08/24 2013) Pulse Rate:  [78-88] 88 (08/25 0830) Resp:  [13-23] 19 (08/24 2013) BP: (91-135)/(62-75) 126/75 (08/25 0830) SpO2:  [92 %-97 %] 96 % (08/24 2013) Last BM Date : 09/04/22 General:    Pale, chronically ill-appearing white female in NAD Heart:  Regular rate and rhythm; no murmurs Lungs: Respirations even and unlabored, lungs CTA bilaterally Abdomen:  Soft, nontender and nondistended. Normal bowel sounds. Psych: Tearful and anxious  Intake/Output from previous day: 08/24 0701 - 08/25 0700 In: -  Out: 500 [Urine:500]  Lab Results: Recent Labs    09/07/22 0633 09/08/22 0737 09/09/22 0450  WBC 8.5 8.9 7.4  HGB 9.3* 10.0* 9.5*  HCT 29.4* 31.2* 29.5*  PLT 299 351 336   BMET Recent Labs    09/07/22 0633 09/08/22 0737 09/09/22 0450  NA 141 138 136  K 3.3* 3.2* 3.7  CL 112* 106 109  CO2 21* 18* 18*  GLUCOSE 78 75 93  BUN 6 7 9   CREATININE 0.53 0.74 0.59  CALCIUM 8.0* 8.0* 8.1*     Assessment / Plan:   Assessment: 1.  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  Pancreatic cyst: Being followed with MRI in 2 years 4.  Altered mental status/general: Patient has had visual loss that he thinks from recent stroke, seems some better today with hallucinations experienced on Friday  Plan: 1.  Enteroscopy and colonoscopy scheduled for tomorrow.  Will start movi prep in split dose fashion this afternoon/evening.  Did review risks, benefits, limitations and alternatives with patient herself, also completed consent with her brother-in-law who is POM/POA  over the phone. (Consent is in Endo ready for tomorrow) 2.  Continue to monitor hemoglobin and transfusion as needed less than 7 3.  Patient was placed on a clear liquid diet now and will be n.p.o. at midnight 4.  Continue to hold Brilinta  Thank you for your kind consultation, we will continue to follow.  Dr. Barron Alvine will be taking over service tomorrow.   LOS: 4 days   Unk Lightning  09/09/2022, 9:49 AM  I have taken an interval history, thoroughly reviewed the chart and examined the patient. I agree with the Advanced Practitioner's note, impression and recommendations, and have recorded additional findings, impressions and recommendations below. I performed a substantive portion of this encounter (>50% time spent), including a complete performance of the medical decision making.  My  additional thoughts are as follows:  Acute on chronic iron deficiency anemia, no overt GI bleeding at present.  On DAPT and OAC up to admission.  Multiple CVAs (details in initial consult note)  Need small bowel enteroscopy and colonoscopy, which are scheduled for tomorrow with Dr. Barron Alvine who will take over the consult service. Given the patient's mental status that appears intermittently altered, we have obtained consent from her family. Decision regarding antiplatelet agents and OAC  pending outcome of endoscopic procedures.   Charlie Pitter III Office:[phone]

## 2022-09-09 NOTE — Progress Notes (Addendum)
Progress Note   Subjective  Chief Complaint: Anemia with dark stool  Patient has had no further signs of GI bleed since admission, but did, initially with reports of dark stool and anemia.  Today she is slightly more clearheaded, though still tearful.  She is nervous to start a bowel prep given that she just does not want to "mess up everywhere".  She wishes for [place] to speak with her husband and/or son in regards to this procedure.  She denies any acute GI complaints or concerns.   Objective   Vital signs in last 24 hours: Temp:  [98 F (36.7 C)-98.6 F (37 C)] 98 F (36.7 C) (08/24 2013) Pulse Rate:  [78-88] 88 (08/25 0830) Resp:  [13-23] 19 (08/24 2013) BP: (91-135)/(62-75) 126/75 (08/25 0830) SpO2:  [92 %-97 %] 96 % (08/24 2013) Last BM Date : 09/04/22 General:    Pale, chronically ill-appearing white female in NAD Heart:  Regular rate and rhythm; no murmurs Lungs: Respirations even and unlabored, lungs CTA bilaterally Abdomen:  Soft, nontender and nondistended. Normal bowel sounds. Psych: Tearful and anxious  Intake/Output from previous day: 08/24 0701 - 08/25 0700 In: -  Out: 500 [Urine:500]  Lab Results: Recent Labs    09/07/22 0633 09/08/22 0737 09/09/22 0450  WBC 8.5 8.9 7.4  HGB 9.3* 10.0* 9.5*  HCT 29.4* 31.2* 29.5*  PLT 299 351 336   BMET Recent Labs    09/07/22 0633 09/08/22 0737 09/09/22 0450  NA 141 138 136  K 3.3* 3.2* 3.7  CL 112* 106 109  CO2 21* 18* 18*  GLUCOSE 78 75 93  BUN 6 7 9   CREATININE 0.53 0.74 0.59  CALCIUM 8.0* 8.0* 8.1*     Assessment / Plan:   Assessment: 1.  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  Pancreatic cyst: Being followed with MRI in 2 years 4.  Altered mental status/general: Patient has had visual loss that he thinks from recent stroke, seems some better today with hallucinations experienced on Friday  Plan: 1.  Enteroscopy and colonoscopy scheduled for tomorrow.  Will start movi prep in split dose fashion this afternoon/evening.  Did review risks, benefits, limitations and alternatives with patient herself, also completed consent with her brother-in-law who is POM/POA  over the phone. (Consent is in Endo ready for tomorrow) 2.  Continue to monitor hemoglobin and transfusion as needed less than 7 3.  Patient was placed on a clear liquid diet now and will be n.p.o. at midnight 4.  Continue to hold Brilinta  Thank you for your kind consultation, we will continue to follow.  Dr. Barron Alvine will be taking over service tomorrow.   LOS: 4 days   Unk Lightning  09/09/2022, 9:49 AM  I have taken an interval history, thoroughly reviewed the chart and examined the patient. I agree with the Advanced Practitioner's note, impression and recommendations, and have recorded additional findings, impressions and recommendations below. I performed a substantive portion of this encounter (>50% time spent), including a complete performance of the medical decision making.  My  additional thoughts are as follows:  Acute on chronic iron deficiency anemia, no overt GI bleeding at present.  On DAPT and OAC up to admission.  Multiple CVAs (details in initial consult note)  Need small bowel enteroscopy and colonoscopy, which are scheduled for tomorrow with Dr. Barron Alvine who will take over the consult service. Given the patient's mental status that appears intermittently altered, we have obtained consent from her family. Decision regarding antiplatelet agents and OAC  pending outcome of endoscopic procedures.   Charlie Pitter III Office:[phone]

## 2022-09-09 NOTE — Progress Notes (Addendum)
Occupational Therapy Treatment Patient Details Name: Melissa Colon MRN: [id number] DOB: Aug 29, 1960 Today's Date: 09/09/2022   History of present illness Pt is a 62 y/o F presenting from [facility] ED as code stroke, MRI brain revealin gacute infarcts in medial and inferior R occipital lobes. Also with ABLA. Recent admission in 07/2022 with CIR admission for CVA. PMH includes alcohol dependence, brain tumor   OT comments  Pt seen for second session to trial occlusion glasses, attempted with both R (non-dominant) and then L (dominant) eye taped, pt still reporting double vision with glasses, at times pt states " I see 10 of you", also reports things are "very big"/magnified and are scaring her, diplopia and magnified visual symptoms are inconsistent.  Pt able to participate in seated visual/perceptual task, pt able to place 3 items in basin located in central and R side, difficulty locating basin on L side needs min cues and increased reliance on tactile vs vision to complete task. Pt emotional and frustrated with visual deficits, states her vision was "not this bad" when she was at home/rehab PTA. Pt presenting with impairments listed below, will follow acutely. Patient will benefit from continued inpatient follow up therapy, <3 hours/day to maximize safety/ind with ADLs/functional mobility.       If plan is discharge home, recommend the following:  A lot of help with walking and/or transfers;A lot of help with bathing/dressing/bathroom;Assistance with cooking/housework;Assist for transportation;Help with stairs or ramp for entrance;Direct supervision/assist for medications management;Direct supervision/assist for financial management   Equipment Recommendations  Other (comment) (defer)    Recommendations for Other Services PT consult    Precautions / Restrictions Precautions Precautions: Fall Precaution Comments: Residual R Hemiparesis (prior CVAs) Restrictions Weight Bearing  Restrictions: No       Mobility Bed Mobility Overal bed mobility: Needs Assistance Bed Mobility: Supine to Sit, Sit to Supine     Supine to sit: Contact guard Sit to supine: Min assist        Transfers                         Balance Overall balance assessment: Needs assistance Sitting-balance support: Bilateral upper extremity supported Sitting balance-Leahy Scale: Fair                                     ADL either performed or assessed with clinical judgement   ADL                                         General ADL Comments: focus of session on vision strategies    Extremity/Trunk Assessment Upper Extremity Assessment RUE Deficits / Details: 2+/5 ROM/strength, keeps wrist flexed at rest, can flex/ext, decr grasp compared to LUE, pt unable to describe baseline R deficits compared to previous deficits RUE Sensation: decreased light touch;decreased proprioception RUE Coordination: decreased fine motor;decreased gross motor LUE Deficits / Details: 3+/5 ROM/strength, generally weak   Lower Extremity Assessment Lower Extremity Assessment: Defer to PT evaluation        Vision   Vision Assessment?: Yes Eye Alignment: Impaired (comment) (disconjugate gaze) Alignment/Gaze Preference: Chin down Tracking/Visual Pursuits: Decreased smoothness of horizontal tracking;Decreased smoothness of vertical tracking;Other (comment) Visual Fields: Left visual field deficit Diplopia Assessment: Objects split side to side;Other (comment) Depth Perception: Overshoots Additional Comments:  hx of diplopia, reports seeing multiple (10 of therapist at one point) and reports objects/people are also magnified. With L eye covered, pt reporting double vision in all visual fields, with R eye occluded pt reported intermittently seeing "1" however is inconsistent and still reports double vision across trials   Perception Perception Perception:  Impaired Preception Impairment Details: Inattention/Neglect   Praxis Praxis Praxis: Impaired Praxis Impairment Details: Motor planning    Cognition Arousal: Alert Behavior During Therapy: Flat affect, Lability Overall Cognitive Status: No family/caregiver present to determine baseline cognitive functioning Area of Impairment: Safety/judgement                         Safety/Judgement: Decreased awareness of safety, Decreased awareness of deficits     General Comments: decr processing time, delayed responses, unable to recall much of home set up information, unaware of where she is or why she is at the hospital, oriented to self and DOB        Exercises Other Exercises Other Exercises: seated functional reach x5 seated EOB    Shoulder Instructions       General Comments VSS on RA    Pertinent Vitals/ Pain       Pain Assessment Pain Assessment: No/denies pain  Home Living                                          Prior Functioning/Environment              Frequency  Min 1X/week        Progress Toward Goals  OT Goals(current goals can now be found in the care plan section)  Progress towards OT goals: Progressing toward goals  Acute Rehab OT Goals Patient Stated Goal: to be able to see OT Goal Formulation: With patient Time For Goal Achievement: 09/23/22 Potential to Achieve Goals: Fair ADL Goals Pt Will Perform Upper Body Dressing: with min assist;sitting Pt Will Perform Lower Body Dressing: with min assist;sit to/from stand;sitting/lateral leans Pt Will Transfer to Toilet: with min assist;squat pivot transfer;stand pivot transfer;bedside commode Pt/caregiver will Perform Home Exercise Program: Right Upper extremity;Increased strength;Increased ROM;With minimal assist Additional ADL Goal #1: pt will follow 2 step command with min cues in prep for ADLs Additional ADL Goal #2: Pt will be able to locate items in R and L visual  fields during functional task with min cues in prep for ADLs  Plan      Co-evaluation                 AM-PAC OT "6 Clicks" Daily Activity     Outcome Measure   Help from another person eating meals?: A Lot Help from another person taking care of personal grooming?: A Lot Help from another person toileting, which includes using toliet, bedpan, or urinal?: A Lot Help from another person bathing (including washing, rinsing, drying)?: A Lot Help from another person to put on and taking off regular upper body clothing?: A Lot Help from another person to put on and taking off regular lower body clothing?: A Lot 6 Click Score: 12    End of Session    OT Visit Diagnosis: Unsteadiness on feet (R26.81);Repeated falls (R29.6);Muscle weakness (generalized) (M62.81) Hemiplegia - Right/Left: Right Hemiplegia - dominant/non-dominant: Dominant Hemiplegia - caused by: Cerebral infarction Pain - Right/Left: Right Pain - part of body: Shoulder  Activity Tolerance Patient tolerated treatment well   Patient Left in bed;with call bell/phone within reach;with bed alarm set   Nurse Communication Mobility status;Other (comment) (visual impairments)        Time: 2536-6440 OT Time Calculation (min): 45 min  Charges: OT General Charges $OT Visit: 1 Visit OT Treatments $Therapeutic Activity: 38-52 mins  Carver Fila, OTD, OTR/L SecureChat Preferred Acute Rehab [phone]   Carver Fila Koonce 09/09/2022, 4:04 PM

## 2022-09-10 ENCOUNTER — Encounter (HOSPITAL_COMMUNITY)
Admission: EM | Disposition: A | Discharge status: Home Health | Payer: Self-pay | Source: Home / Self Care | Attending: Internal Medicine

## 2022-09-10 ENCOUNTER — Encounter (HOSPITAL_COMMUNITY): Payer: Self-pay | Admitting: Family Medicine

## 2022-09-10 ENCOUNTER — Inpatient Hospital Stay (HOSPITAL_COMMUNITY): Payer: Medicaid Other | Admitting: Anesthesiology

## 2022-09-10 DIAGNOSIS — Z1211 Encounter for screening for malignant neoplasm of colon: Secondary | ICD-10-CM

## 2022-09-10 DIAGNOSIS — K31819 Angiodysplasia of stomach and duodenum without bleeding: Secondary | ICD-10-CM

## 2022-09-10 DIAGNOSIS — R195 Other fecal abnormalities: Secondary | ICD-10-CM | POA: Diagnosis not present

## 2022-09-10 DIAGNOSIS — I679 Cerebrovascular disease, unspecified: Secondary | ICD-10-CM | POA: Diagnosis not present

## 2022-09-10 DIAGNOSIS — D500 Iron deficiency anemia secondary to blood loss (chronic): Secondary | ICD-10-CM | POA: Diagnosis not present

## 2022-09-10 DIAGNOSIS — Z7902 Long term (current) use of antithrombotics/antiplatelets: Secondary | ICD-10-CM | POA: Diagnosis not present

## 2022-09-10 DIAGNOSIS — K921 Melena: Secondary | ICD-10-CM | POA: Diagnosis not present

## 2022-09-10 DIAGNOSIS — K5521 Angiodysplasia of colon with hemorrhage: Secondary | ICD-10-CM

## 2022-09-10 DIAGNOSIS — D62 Acute posthemorrhagic anemia: Secondary | ICD-10-CM | POA: Diagnosis not present

## 2022-09-10 DIAGNOSIS — R2990 Unspecified symptoms and signs involving the nervous system: Principal | ICD-10-CM

## 2022-09-10 HISTORY — PX: HEMOSTASIS CLIP PLACEMENT: SHX6857

## 2022-09-10 HISTORY — PX: COLONOSCOPY WITH PROPOFOL: SHX5780

## 2022-09-10 HISTORY — PX: ENTEROSCOPY: SHX5533

## 2022-09-10 HISTORY — PX: HOT HEMOSTASIS: SHX5433

## 2022-09-10 LAB — BASIC METABOLIC PANEL
Anion gap: 13 (ref 5–15)
BUN: 9 mg/dL (ref 6–20)
CO2: 20 mmol/L — ABNORMAL LOW (ref 22–32)
Calcium: 8.7 mg/dL — ABNORMAL LOW (ref 8.9–10.3)
Chloride: 110 mmol/L (ref 98–111)
Creatinine, Ser: 0.64 mg/dL (ref 0.44–1.00)
GFR, Estimated: >60 mL/min (ref >60)
Glucose, Bld: 105 mg/dL — ABNORMAL HIGH (ref 70–99)
Potassium: 3.8 mmol/L (ref 3.5–5.1)
Sodium: 143 mmol/L (ref 135–145)

## 2022-09-10 LAB — CBC
HCT: 33 % — ABNORMAL LOW (ref 36.0–46.0)
Hemoglobin: 10.3 g/dL — ABNORMAL LOW (ref 12.0–15.0)
MCH: 27.4 pg (ref 26.0–34.0)
MCHC: 31.2 g/dL (ref 30.0–36.0)
MCV: 87.8 fL (ref 80.0–100.0)
Platelets: 380 10*3/uL (ref 150–400)
RBC: 3.76 MIL/uL — ABNORMAL LOW (ref 3.87–5.11)
RDW: 17.2 % — ABNORMAL HIGH (ref 11.5–15.5)
WBC: 8.1 10*3/uL (ref 4.0–10.5)
nRBC: 0 % (ref 0.0–0.2)

## 2022-09-10 LAB — MAGNESIUM: Magnesium: 2.1 mg/dL (ref 1.7–2.4)

## 2022-09-10 SURGERY — ENTEROSCOPY
Anesthesia: Monitor Anesthesia Care

## 2022-09-10 MED ORDER — PHENYLEPHRINE 80 MCG/ML (10ML) SYRINGE FOR IV PUSH (FOR BLOOD PRESSURE SUPPORT)
PREFILLED_SYRINGE | INTRAVENOUS | Status: DC | PRN
  Administered 2022-09-10: 80 ug via INTRAVENOUS
  Administered 2022-09-10 (×2): 160 ug via INTRAVENOUS

## 2022-09-10 MED ORDER — PROPOFOL 500 MG/50ML IV EMUL
INTRAVENOUS | Status: DC | PRN
  Administered 2022-09-10: 125 ug/kg/min via INTRAVENOUS

## 2022-09-10 MED ORDER — EPHEDRINE SULFATE-NACL 50-0.9 MG/10ML-% IV SOSY
PREFILLED_SYRINGE | INTRAVENOUS | Status: DC | PRN
  Administered 2022-09-10: 5 mg via INTRAVENOUS

## 2022-09-10 MED ORDER — PANTOPRAZOLE SODIUM 40 MG PO TBEC
40.0000 mg | DELAYED_RELEASE_TABLET | Freq: Every day | ORAL | Status: DC
  Administered 2022-09-11 – 2022-09-13 (×3): 40 mg via ORAL
  Filled 2022-09-10 (×3): qty 1

## 2022-09-10 MED ORDER — SODIUM CHLORIDE 0.9 % IV SOLN: INTRAVENOUS | Status: DC

## 2022-09-10 MED ORDER — PROPOFOL 10 MG/ML IV BOLUS
INTRAVENOUS | Status: DC | PRN
  Administered 2022-09-10: 20 mg via INTRAVENOUS
  Administered 2022-09-10: 10 mg via INTRAVENOUS

## 2022-09-10 MED ORDER — LACTATED RINGERS IV SOLN: INTRAVENOUS | Status: DC | PRN

## 2022-09-10 SURGICAL SUPPLY — 22 items

## 2022-09-10 NOTE — Anesthesia Procedure Notes (Signed)
Procedure Name: MAC Date/Time: 09/10/2022 12:25 PM  Performed by: Nils Pyle, CRNAPre-anesthesia Checklist: Patient identified, Emergency Drugs available, Suction available and Patient being monitored Patient Re-evaluated:Patient Re-evaluated prior to induction Oxygen Delivery Method: Nasal cannula Preoxygenation: Pre-oxygenation with 100% oxygen Induction Type: IV induction Placement Confirmation: positive ETCO2 and breath sounds checked- equal and bilateral Dental Injury: Teeth and Oropharynx as per pre-operative assessment

## 2022-09-10 NOTE — Progress Notes (Signed)
PT Cancellation Note  Patient Details Name: Melissa Colon MRN: [id number] DOB: October 08, 1960   Cancelled Treatment:    Reason Eval/Treat Not Completed: Patient at procedure or test/unavailable. Will re-attempt at later date.    Salaam Battershell 09/10/2022, 1:29 PM

## 2022-09-10 NOTE — Plan of Care (Signed)

## 2022-09-10 NOTE — Op Note (Signed)
[facility] Patient Name: Melissa Colon Procedure Date : 09/10/2022 MRN: [id number] Attending MD: Doristine Locks , MD, [id number] Date of Birth: 1960-07-21 CSN: [id number] Age: 62 Admit Type: Inpatient Procedure:                Colonoscopy Indications:              Heme positive stool, Acute post hemorrhagic anemia Providers:                Doristine Locks, MD, Cephus Richer, RN, Fransisca Connors Referring MD:              Medicines:                Monitored Anesthesia Care Complications:            No immediate complications. Estimated Blood Loss:     Estimated blood loss: none. Procedure:                Pre-Anesthesia Assessment:                           - Prior to the procedure, a History and Physical                            was performed, and patient medications and                            allergies were reviewed. The patient's tolerance of                            previous anesthesia was also reviewed. The risks                            and benefits of the procedure and the sedation                            options and risks were discussed with the patient.                            All questions were answered, and informed consent                            was obtained. Prior Anticoagulants: The patient has                            taken Plavix (clopidogrel), last dose was 5 days                            prior to procedure. ASA Grade Assessment: III - A                            patient with severe systemic disease. After  reviewing the risks and benefits, the patient was                            deemed in satisfactory condition to undergo the                            procedure.                           After obtaining informed consent, the colonoscope                            was passed under direct vision. Throughout the                            procedure, the patient's blood  pressure, pulse, and                            oxygen saturations were monitored continuously. The                            PCF-HQ190TL ([id number] Olympus peds colonoscope was                            introduced through the anus and advanced to the 10                            cm into the ileum. The colonoscopy was performed                            without difficulty. The patient tolerated the                            procedure well. The quality of the bowel                            preparation was adequate. The terminal ileum,                            ileocecal valve, appendiceal orifice, and rectum                            were photographed. Scope In: 12:53:05 PM Scope Out: 1:00:43 PM Scope Withdrawal Time: 0 hours 5 minutes 25 seconds  Total Procedure Duration: 0 hours 7 minutes 38 seconds  Findings:      The perianal and digital rectal examinations were normal.      The colon appeared normal.      A moderate amount of stool was found in the sigmoid colon, in the       ascending colon and in the cecum. Lavage of the area was performed using       copious amounts of tap water, resulting in clearance with adequate       visualization.      The terminal ileum appeared normal.      The retroflexed view of  the distal rectum and anal verge was normal and       showed no anal or rectal abnormalities. Impression:               - The entire examined colon is normal.                           - Stool in the sigmoid colon, in the ascending                            colon and in the cecum.                           - The examined portion of the ileum was normal.                           - The distal rectum and anal verge are normal on                            retroflexion view.                           - No specimens collected. Recommendation:           - Return patient to hospital ward for ongoing care.                           - Advance diet as tolerated.                            - Continue present medications.                           - Ok to restart antiplatelet therapy and systemic                            anticoagulation. Would start with heparin initially.                           - Continue serial CBC checks particularly after                            starting antiplatelets.                           - Results discussed with family member by phone. Procedure Code(s):        --- Professional ---                           [phone], Colonoscopy, flexible; diagnostic, including                            collection of specimen(s) by brushing or washing,                            when performed (separate procedure) Diagnosis Code(s):        ---  Professional ---                           R19.5, Other fecal abnormalities                           D62, Acute posthemorrhagic anemia CPT copyright 2022 American Medical Association. All rights reserved. The codes documented in this report are preliminary and upon coder review may  be revised to meet current compliance requirements. Doristine Locks, MD 09/10/2022 1:27:00 PM Number of Addenda: 0

## 2022-09-10 NOTE — Progress Notes (Signed)
PROGRESS NOTE        PATIENT DETAILS Name: Melissa Colon Age: 61 y.o. Sex: female Date of Birth: 04/27/1960 Admit Date: 09/05/2022 Admitting Physician Anselm Jungling, DO PCP:Pcp, No  Brief Summary: Patient is a 62 y.o.  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  She was noted to have melanotic stools.  Upon further evaluation she was found to have hemoglobin of 6.1, MRI was positive for CVA.  She was then admitted to the hospitalist service.   Significant events: 7/12-7/14 hospitalization for acute right MCA territory infarct.  Discharged to CIR. 7/16-8/2>> discharged from CIR-following CVA 8/14-8/18>> [facility] ED-abdominal pain/constipation-boarded in [facility] ED as family voiced concern that they were unable to take care of her.  Unfortunately-unable to be placed to SNF-sent home with home health. 8/21>> admit to [facility]-presented to ED with stroke symptoms/GI bleed/severe anemia  Significant studies: 7/13>> echo: EF 65-70%, grade 1 diastolic dysfunction 8/21>> MRI brain: Acute infarct right PCA territory 8/21>> CT angio head/neck: Intracranial branch atherosclerosis similar to prior studies-advanced bilateral PCA narrowing. 8/21>> CT angio chest: No PE. 8/21>> CT abdomen/pelvis: No acute abnormality. 8/22>> A1c: 5.4 8/22>> LDL: 30 open  Significant microbiology data: None  Procedures: None  Consults: GI Neurology  Subjective:   Patient in bed, appears comfortable, denies any headache, no fever, no chest pain or pressure, no shortness of breath , no abdominal pain. No new focal weakness.   Objective: Vitals: Blood pressure 115/66, pulse 77, temperature (!) 97.4 F (36.3 C), temperature source Oral, resp. rate (!) 22, weight 70.2 kg, SpO2 99%.    Exam:  Awake, does have some dysarthria, mild right-sided facial droop, left sided weakness left arm most weak, some chronic visual disturbance, [place].AT,PERRAL Supple Neck, No JVD,   Symmetrical Chest wall movement, Good air movement bilaterally, CTAB RRR,No Gallops, Rubs or new Murmurs,  +ve B.Sounds, Abd Soft, No tenderness,   No Cyanosis, Clubbing or edema    Assessment/Plan:  Acute blood loss anemia secondary to presumed upper GI bleeding Likely secondary to AVMs (seen on prior EGD in July 2024) in the setting of DAPT use for recent CVA GI bleeding seems to have resolved Continue PPI twice daily Hb stable Required 2 units of PRBC on admission-none since then. GI on board patient due for enteroscopy and colonoscopy on 09/10/2022, single agent antiplatelet therapy will be resumed once cleared by GI to do so.  Acute right PCA territory infarct this admission, likely in the setting of large vessel intracranial disease exacerbated by hypotension caused by severe blood loss anemia Recent history of right MCA territory in July 2024 infarct on DAPT History of ICH versus hemorrhagic conversion 2021 Acute infarct felt to be due to hypotension/acute blood loss-overall improved following blood transfusion. Antiplatelets on hold due to active GI bleeding Workup as above for CVA Visual deficits and left upper extremity deficits appear essentially unchanged. No further recommendations from neurology-about to resume either ASA or Plavix monotherapy when cleared by GI. Await further recommendations from PT/OT and SLP.  HTN BP stable Not on any antihypertensives  HLD Resume statin when oral intake resumes.  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   Weight as of this encounter: 70.2 kg.   Code status:   Code Status: Full Code   DVT Prophylaxis: SCDs Start: 09/05/22 2309   Family Communication: Spouse and son at bedside on 8/23-none at bedside this morning    Disposition Plan: Status is: Inpatient Remains inpatient appropriate because: Severity of illness   Planned Discharge Destination:Skilled nursing facility   Diet: Diet Order             Diet NPO time specified Except for: Sips with Meds, Ice Chips  Diet effective midnight                     Antimicrobial agents: Anti-infectives (From admission, onward)    None        MEDICATIONS: Scheduled Meds:  sodium chloride   Intravenous Once   atorvastatin  80 mg Oral Daily   pantoprazole (PROTONIX) IV  40 mg Intravenous Q12H   Continuous Infusions: PRN Meds:.acetaminophen, albuterol, LORazepam, ondansetron (ZOFRAN) IV, polyethylene glycol   I have personally reviewed following labs and imaging studies  LABORATORY DATA:  Recent Labs  Lab 09/05/22 1458 09/05/22 1508 09/06/22 0607 09/07/22 0633 09/08/22 0737 09/09/22 0450 09/10/22 0327  WBC 13.9*  --  9.1 8.5 8.9 7.4 8.1  HGB 6.1*   < > 9.4* 9.3* 10.0* 9.5* 10.3*  HCT 20.4*   < > 27.9* 29.4* 31.2* 29.5* 33.0*  PLT 499*  --  309 299 351 336 380  MCV 84.6  --  86.4 86.7 88.4 87.3 87.8  MCH 25.3*  --  29.1 27.4 28.3 28.1 27.4  MCHC 29.9*  --  33.7 31.6 32.1 32.2 31.2  RDW 15.6*  --  15.8* 16.1* 16.2* 16.9* 17.2*  LYMPHSABS 1.6  --   --   --   --   --   --   MONOABS 0.7  --   --   --   --   --   --   EOSABS 0.0  --   --   --   --   --   --   BASOSABS 0.1  --   --   --   --   --   --    < > = values in this interval not displayed.    Recent Labs  Lab 09/05/22 1458 09/05/22 1508 09/05/22 1722 09/06/22 0605 09/06/22 0607 09/07/22 3664 09/08/22 0737  09/09/22 0450 09/10/22 0327  NA 139   < >  --   --  136 141 138 136 143  K 3.5   < >  --   --  3.2* 3.3* 3.2* 3.7 3.8  CL 110   < >  --   --  107 112* 106 109 110  CO2 18*  --   --   --  20* 21* 18* 18* 20*  ANIONGAP 11  --   --   --  9 8 14 9 13   GLUCOSE 121*   < >  --   --  96 78 75 93 105*  BUN 23*   < >  --   --  15 6 7 9 9   CREATININE 0.79   < >  --   --  0.61 0.53 0.74 0.59 0.64  AST 18  --   --   --   --   --   --   --   --   ALT 21  --   --   --   --   --   --   --   --   ALKPHOS 121  --   --   --   --   --   --   --   --   BILITOT 0.6  --   --   --   --   --   --   --   --   ALBUMIN 3.3*  --   --   --   --   --   --   --   --   INR 1.1  --   --   --   --   --   --   --   --   HGBA1C  --   --   --  5.4  --   --   --   --   --   BNP  --   --  53.4  --   --   --   --   --   --   MG  --   --   --   --   --   --  2.0 2.0 2.1  CALCIUM 8.6*  --   --   --  7.8* 8.0* 8.0* 8.1* 8.7*   < > = values in this interval not displayed.    Lab Results  Component Value Date   CHOL 84 09/06/2022   HDL 27 (L) 09/06/2022   LDLCALC 37 09/06/2022   TRIG 101 09/06/2022   CHOLHDL 3.1 09/06/2022     LOS: 5 days   Signature  -    Susa Raring M.D on 09/10/2022 at 8:43 AM   -  To page go to [email or web address]

## 2022-09-10 NOTE — Transfer of Care (Signed)
Immediate Anesthesia Transfer of Care Note  Patient: Melissa Colon  Procedure(s) Performed: ENTEROSCOPY COLONOSCOPY WITH PROPOFOL HEMOSTASIS CONTROL  Patient Location: Endoscopy Unit  Anesthesia Type:MAC  Level of Consciousness: awake and alert   Airway & Oxygen Therapy: Patient Spontanous Breathing and Patient connected to nasal cannula oxygen  Post-op Assessment: Report given to RN, Post -op Vital signs reviewed and stable, and Patient moving all extremities X 4  Post vital signs: Reviewed and stable  Last Vitals:  Vitals Value Taken Time  BP 129/68 09/10/22 1320  Temp    Pulse 65 09/10/22 1321  Resp 21 09/10/22 1321  SpO2 98 % 09/10/22 1321  Vitals shown include unfiled device data.  Last Pain:  Vitals:   09/10/22 1125  TempSrc: Temporal  PainSc: 0-No pain         Complications: No notable events documented.

## 2022-09-10 NOTE — Interval H&P Note (Signed)
History and Physical Interval Note:  No acute events overnight.  H/H stable at 10.3/33 following 2 unit PRBC transfusion on 8/21.  No overt bleeding overnight.  DAPT and anticoagulation have been held.  Due to AMS, consent obtained by family.  09/10/2022 11:40 AM  Melissa Colon  has presented today for surgery, with the diagnosis of Anemia.  The various methods of treatment have been discussed with the patient and family. After consideration of risks, benefits and other options for treatment, the patient and family have consented to  Procedure(s): ENTEROSCOPY (N/A) COLONOSCOPY WITH PROPOFOL (N/A) as a surgical intervention.  The patient's history has been reviewed, patient examined, no change in status, stable for surgery.  I have reviewed the patient's chart and labs.  Questions were answered to the patient's satisfaction.     Verlin Dike Joandy Burget

## 2022-09-10 NOTE — Progress Notes (Signed)
Pt was placed on a rectal pouch due to subsequent bouts of loose stools due to bowel prep. At 0100, pt's bowel movement started to turn clear.

## 2022-09-10 NOTE — Progress Notes (Signed)
OT Cancellation Note  Patient Details Name: Melissa Colon MRN: [id number] DOB: 1960-04-30   Cancelled Treatment:    Reason Eval/Treat Not Completed: Patient at procedure or test/ unavailable  [facility] 09/10/2022, 11:41 AM Luisa Dago, OT/L   Acute OT Clinical Specialist Acute Rehabilitation Services Pager [phone] Office [phone]

## 2022-09-10 NOTE — Anesthesia Preprocedure Evaluation (Addendum)
Anesthesia Evaluation  Patient identified by MRN, date of birth, ID band Patient awake    Reviewed: Allergy & Precautions, Patient's Chart, lab work & pertinent test results, Unable to perform ROS - Chart review only  Airway Mallampati: III  TM Distance: >3 FB Neck ROM: Full    Dental  (+) Missing   Pulmonary Current Smoker and Patient abstained from smoking.   Pulmonary exam normal        Cardiovascular hypertension, Normal cardiovascular exam+ Valvular Problems/Murmurs   Echo 07/2022  1. Left ventricular ejection fraction, by estimation, is 65 to 70%. The left ventricle has normal function. The left ventricle has no regional wall motion abnormalities. Left ventricular diastolic parameters are consistent with Grade I diastolic dysfunction (impaired relaxation).   2. Right ventricular systolic function is normal. The right ventricular size is normal. There is normal pulmonary artery systolic pressure.   3. The mitral valve is normal in structure. No evidence of mitral valve regurgitation. No evidence of mitral stenosis.   4. The aortic valve is normal in structure. There is mild calcification of the aortic valve. Aortic valve regurgitation is not visualized. No aortic stenosis is present.   5. The inferior vena cava is normal in size with greater than 50% respiratory variability, suggesting right atrial pressure of 3 mmHg.     Echo 11/2019  1. Left ventricular ejection fraction, by estimation, is 60 to 65%. The left ventricle has normal function. The left ventricle has no regional wall motion abnormalities. Left ventricular diastolic parameters are consistent with Grade I diastolic dysfunction (impaired relaxation).   2. Right ventricular systolic function is normal. The right ventricular size is normal. There is normal pulmonary artery systolic pressure.   3. The mitral valve is normal in structure. Mild mitral valve regurgitation. No  evidence of mitral stenosis.   4. The aortic valve is normal in structure. Aortic valve regurgitation is not visualized. No aortic stenosis is present.   5. The inferior vena cava is normal in size with greater than 50% respiratory variability, suggesting right atrial pressure of 3 mmHg.     Neuro/Psych CVA, Residual Symptoms  negative psych ROS   GI/Hepatic negative GI ROS,,,(+)     substance abuse  alcohol use  Endo/Other  negative endocrine ROS    Renal/GU negative Renal ROS     Musculoskeletal negative musculoskeletal ROS (+)    Abdominal   Peds  Hematology  (+) Blood dyscrasia (Plavix), anemia   Anesthesia Other Findings GI bleed  Reproductive/Obstetrics                             Anesthesia Physical Anesthesia Plan  ASA: 3  Anesthesia Plan: MAC   Post-op Pain Management:    Induction: Intravenous  PONV Risk Score and Plan: 1 and Propofol infusion and Treatment may vary due to age or medical condition  Airway Management Planned: Nasal Cannula  Additional Equipment:   Intra-op Plan:   Post-operative Plan:   Informed Consent: I have reviewed the patients History and Physical, chart, labs and discussed the procedure including the risks, benefits and alternatives for the proposed anesthesia with the patient or authorized representative who has indicated his/her understanding and acceptance.     Consent reviewed with POA and Dental advisory given  Plan Discussed with: CRNA  Anesthesia Plan Comments:        Anesthesia Quick Evaluation

## 2022-09-10 NOTE — TOC Initial Note (Signed)
Transition of Care [facility]) - Initial/Assessment Note    Patient Details  Name: Melissa Colon MRN: [id number] Date of Birth: 1960-11-02  Transition of Care [facility]) CM/SW Contact:    Mearl Latin, LCSW Phone Number: 09/10/2022, 4:35 PM  Clinical Narrative:                 CSW spoke with patient's brother. He reported patient lives with her husband (has advanced emphysema) and adult son who has been her primary caregiver though brother has stepped in to help. He explained that patient was unable to be placed during the previous hospitalizations from CIR to SNF because patient's insurance does not have SNF benefits. He is in the process of applying for disability but stated he needs to get the patient to a PCP to sign paperwork which he has not been able to do since she keeps ending up at the hospital. CSW will ask MD if the hospital can assist with paperwork and will email Financial Counseling to see if there is anything they can help with. [name] requested patient be evaluated by CIR again. CSW explained they may not be able to take her this time but will request screen. He stated patient was active with [facility] and has a hospital bed, air mattress, wheelchair, and walker.     Barriers to Discharge: Continued Medical Work up, English as a second language teacher, Inadequate or no insurance   Patient Goals and CMS Choice Patient states their goals for this hospitalization and ongoing recovery are:: Rehab CMS Medicare.gov Compare Post Acute Care list provided to:: Patient Represenative (must comment) Choice offered to / list presented to : Adult Children      Expected Discharge Plan and Services In-house Referral: Clinical Social Work, Conservator, museum/gallery Services: [employer] Consult Post Acute Care Choice: Home Health, IP Rehab Living arrangements for the past 2 months: Single Family Home                                      Prior Living Arrangements/Services Living  arrangements for the past 2 months: Single Family Home Lives with:: Spouse, Adult Children Patient language and need for interpreter reviewed:: Yes Do you feel safe going back to the place where you live?: Yes      Need for Family Participation in Patient Care: Yes (Comment) Care giver support system in place?: Yes (comment) Current home services: DME, Home PT ([facility]-Hospital bed, air mattress; wheelchair, walker) Criminal Activity/Legal Involvement Pertinent to Current Situation/Hospitalization: No - Comment as needed  Activities of Daily Living      Permission Sought/Granted Permission sought to share information with : Facility Medical sales representative, Family Supports Permission granted to share information with : Yes, Verbal Permission Granted  Share Information with NAME: [name]  Permission granted to share info w AGENCY: CIR/HH  Permission granted to share info w Relationship: Brother  Permission granted to share info w Contact Information: [phone]  Emotional Assessment Appearance:: Appears stated age Attitude/Demeanor/Rapport: Unable to Assess Affect (typically observed): Unable to Assess Orientation: : Oriented to Self Alcohol / Substance Use: Not Applicable Psych Involvement: No (comment)  Admission diagnosis:  Melena [K92.1] Acute blood loss anemia [D62] Tachycardia [R00.0] Upper GI bleed [K92.2] Stroke-like symptoms [R29.90] Abdominal pain, unspecified abdominal location [R10.9] Anemia, unspecified type [D64.9] Dyspnea, unspecified type [R06.00] Patient Active Problem List   Diagnosis Date Noted   AVM (arteriovenous malformation) of small bowel, acquired with  hemorrhage 09/10/2022   Stroke-like symptoms 09/10/2022   Melena 09/09/2022   Heme positive stool 09/09/2022   Anemia due to chronic blood loss 09/09/2022   Long term (current) use of antithrombotics/antiplatelets 09/09/2022   Acute stroke due to ischemia [facility]) 09/06/2022   Pancreatic cyst 09/05/2022    Acute blood loss anemia 09/05/2022   Aphasia 09/05/2022   History of CVA (cerebrovascular accident) 09/05/2022   Anemia 08/06/2022   Acute ischemic right MCA stroke (HCC) 07/31/2022   Stroke (cerebrum) (HCC) 07/28/2022   Right sided weakness 07/27/2022   Protein-calorie malnutrition, severe 12/16/2019   Hypertensive emergency 12/11/2019   Alcohol dependence (HCC) 12/11/2019   Tobacco use disorder 12/11/2019   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 [place] [address] Phone: [phone] Fax: [phone]  [id number] [place], [place] - [address] AT [facility] [address] [place] [address] Phone: [phone] Fax: [phone]     Social Determinants of Health (SDOH) Social History: SDOH Screenings   Food Insecurity: Patient Declined (07/28/2022)  Housing: Patient Declined (07/28/2022)  Transportation Needs: Patient Declined (07/28/2022)  Utilities: Patient Declined (07/28/2022)  Tobacco Use: High Risk (09/10/2022)   SDOH Interventions:     Readmission Risk Interventions     No data to display

## 2022-09-10 NOTE — Op Note (Signed)
[facility] Patient Name: Melissa Colon Procedure Date : 09/10/2022 MRN: [id number] Attending MD: Doristine Locks , MD, [id number] Date of Birth: April 06, 1960 CSN: [id number] Age: 62 Admit Type: Inpatient Procedure:                Small bowel enteroscopy Indications:              Acute post hemorrhagic anemia Providers:                Doristine Locks, MD, Fransisca Connors, Cephus Richer, RN, Sunday Corn Mbumina, Technician Referring MD:              Medicines:                Monitored Anesthesia Care Complications:            No immediate complications. Estimated Blood Loss:     Estimated blood loss was minimal. Procedure:                Pre-Anesthesia Assessment:                           - Prior to the procedure, a History and Physical                            was performed, and patient medications and                            allergies were reviewed. The patient's tolerance of                            previous anesthesia was also reviewed. The risks                            and benefits of the procedure and the sedation                            options and risks were discussed with the patient.                            All questions were answered, and informed consent                            was obtained. Prior Anticoagulants: The patient has                            taken Plavix (clopidogrel), last dose was 5 days                            prior to procedure. ASA Grade Assessment: III - A                            patient with severe systemic disease. After  reviewing the risks and benefits, the patient was                            deemed in satisfactory condition to undergo the                            procedure.                           After obtaining informed consent, the endoscope was                            passed under direct vision. Throughout the                             procedure, the patient's blood pressure, pulse, and                            oxygen saturations were monitored continuously. The                            [id number]-HQ190TL ([id number]) Olympus peds colonoscope was                            introduced through the mouth and advanced to the                            proximal jejunum. The small bowel enteroscopy was                            accomplished without difficulty. The patient                            tolerated the procedure well. Scope In: Scope Out: Findings:      The examined esophagus was normal.      A 3 cm hiatal hernia was present.      The entire examined stomach was normal.      Five angioectasias with no bleeding were found in the duodenal bulb, in       the second portion of the duodenum, and in the third portion of the       duodenum. Coagulation for hemostasis using argon plasma was successful.       There was persistent oozing at one of the APC sites in the duodenal       bulb. For hemostasis, one hemostatic clip was successfully placed (MR       conditional). Clip manufacturer: [employer]. There was no       bleeding at the end of the procedure.      The remainder of the small bowel mucosa to the proximal jejunum was       otherwise normal appearing. Impression:               - Normal esophagus.                           - 3 cm hiatal hernia.                           -  Normal stomach.                           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                            Treated with argon plasma coagulation (APC). Clip                            (MR conditional) was placed. Clip manufacturer:                            [employer].                           - No specimens collected. Recommendation:           - Perform a colonoscopy today.                           - Additional recommendations pending colonoscopy                            findings.                           - If  continued or recurrent bleeding, consider                            Video Capsule Endoscopy for further small bowel                            interrogation for additional AVMs in the mid and                            distal small bowel. Procedure Code(s):        --- Professional ---                           [phone], Small intestinal endoscopy, enteroscopy                            beyond second portion of duodenum, not including                            ileum; with control of bleeding (eg, injection,                            bipolar cautery, unipolar cautery, laser, heater                            probe, stapler, plasma coagulator) Diagnosis Code(s):        --- Professional ---                           K44.9, Diaphragmatic hernia without obstruction or  gangrene                           K31.819, Angiodysplasia of stomach and duodenum                            without bleeding                           D62, Acute posthemorrhagic anemia CPT copyright 2022 American Medical Association. All rights reserved. The codes documented in this report are preliminary and upon coder review may  be revised to meet current compliance requirements. Doristine Locks, MD 09/10/2022 1:19:01 PM Number of Addenda: 0

## 2022-09-11 ENCOUNTER — Encounter (HOSPITAL_COMMUNITY): Payer: Self-pay | Admitting: Gastroenterology

## 2022-09-11 DIAGNOSIS — K5521 Angiodysplasia of colon with hemorrhage: Secondary | ICD-10-CM

## 2022-09-11 DIAGNOSIS — R195 Other fecal abnormalities: Secondary | ICD-10-CM | POA: Diagnosis not present

## 2022-09-11 DIAGNOSIS — Z8673 Personal history of transient ischemic attack (TIA), and cerebral infarction without residual deficits: Secondary | ICD-10-CM | POA: Diagnosis not present

## 2022-09-11 DIAGNOSIS — D62 Acute posthemorrhagic anemia: Secondary | ICD-10-CM | POA: Diagnosis not present

## 2022-09-11 LAB — BASIC METABOLIC PANEL
Anion gap: 9 (ref 5–15)
BUN: 8 mg/dL (ref 6–20)
CO2: 19 mmol/L — ABNORMAL LOW (ref 22–32)
Calcium: 8.4 mg/dL — ABNORMAL LOW (ref 8.9–10.3)
Chloride: 111 mmol/L (ref 98–111)
Creatinine, Ser: 0.53 mg/dL (ref 0.44–1.00)
GFR, Estimated: >60 mL/min (ref >60)
Glucose, Bld: 103 mg/dL — ABNORMAL HIGH (ref 70–99)
Potassium: 3.4 mmol/L — ABNORMAL LOW (ref 3.5–5.1)
Sodium: 139 mmol/L (ref 135–145)

## 2022-09-11 LAB — CBC
HCT: 31.8 % — ABNORMAL LOW (ref 36.0–46.0)
Hemoglobin: 10.4 g/dL — ABNORMAL LOW (ref 12.0–15.0)
MCH: 29.1 pg (ref 26.0–34.0)
MCHC: 32.7 g/dL (ref 30.0–36.0)
MCV: 89.1 fL (ref 80.0–100.0)
Platelets: 352 10*3/uL (ref 150–400)
RBC: 3.57 MIL/uL — ABNORMAL LOW (ref 3.87–5.11)
RDW: 17 % — ABNORMAL HIGH (ref 11.5–15.5)
WBC: 7.4 10*3/uL (ref 4.0–10.5)
nRBC: 0 % (ref 0.0–0.2)

## 2022-09-11 LAB — MAGNESIUM: Magnesium: 2 mg/dL (ref 1.7–2.4)

## 2022-09-11 MED ORDER — POTASSIUM CHLORIDE CRYS ER 20 MEQ PO TBCR: 40.0000 meq | EXTENDED_RELEASE_TABLET | Freq: Once | ORAL | Status: DC

## 2022-09-11 MED ORDER — CLOPIDOGREL BISULFATE 75 MG PO TABS
75.0000 mg | ORAL_TABLET | Freq: Every day | ORAL | Status: DC
  Administered 2022-09-11 – 2022-09-13 (×3): 75 mg via ORAL
  Filled 2022-09-11 (×3): qty 1

## 2022-09-11 NOTE — Anesthesia Postprocedure Evaluation (Signed)
Anesthesia Post Note  Patient: Melissa Colon  Procedure(s) Performed: ENTEROSCOPY COLONOSCOPY WITH PROPOFOL HOT HEMOSTASIS (ARGON PLASMA COAGULATION/BICAP) HEMOSTASIS CLIP PLACEMENT     Patient location during evaluation: Endoscopy Anesthesia Type: MAC Level of consciousness: awake and alert Pain management: pain level controlled Vital Signs Assessment: post-procedure vital signs reviewed and stable Respiratory status: spontaneous breathing Cardiovascular status: stable Anesthetic complications: no   No notable events documented.  Last Vitals:  Vitals:   09/11/22 0022 09/11/22 0353  BP: (!) 112/56 113/69  Pulse: 65 69  Resp: 18 18  Temp: 36.7 C 36.6 C  SpO2: 99% 98%    Last Pain:  Vitals:   09/11/22 0353  TempSrc: Oral  PainSc:                  Lewie Loron

## 2022-09-11 NOTE — Plan of Care (Signed)

## 2022-09-11 NOTE — Progress Notes (Addendum)
Attending physician's note   I have taken a history, reviewed the chart, and examined the patient. I performed a substantive portion of this encounter, including complete performance of at least one of the key components, in conjunction with the APP. I agree with the APP's note, impression, and recommendations with my edits.   Push enteroscopy yesterday with 5 small bowel AVMs, all treated with APC (and 1 hemostatic clip placed in the duodenal bulb) along with 3 cm hiatal hernia.  Colonoscopy otherwise unrevealing.  Plavix was restarted.  No overt bleeding overnight/this morning.  H/H stable at 10.4/31.8.  Discussed with patient along with family members at bedside.  Inpatient GI service will sign off at this time.  If any concern for rebleeding, will plan for VCE for further small bowel interrogation.  [address], DO, FACG [phone] office          Progress Note  Primary GI: Unassigned ( Dr. Myrtie Neither) DOA: 09/05/2022         Hospital Day: 7   Subjective  Chief Complaint: Anemia and Dark stool  No family was present at the time of my evaluation. Patient denies any bowel movements this morning, no nausea no vomiting. She was slightly tearful, stating that "something is getting very large and no one believes her".  Unable to clarify what, possibly some confusion.  1    Objective   Vital signs in last 24 hours: Temp:  [97 F (36.1 C)-98.1 F (36.7 C)] 98.1 F (36.7 C) (08/27 0845) Pulse Rate:  [48-88] 66 (08/27 0845) Resp:  [15-22] 19 (08/27 0845) BP: (80-137)/(39-71) 135/71 (08/27 0845) SpO2:  [93 %-100 %] 93 % (08/27 0845) Last BM Date : 09/10/22 Last BM recorded by nurses in past 5 days Stool Type: Type 7 (Liquid consistency with no solid pieces) (09/09/2022  8:00 PM)  General:   Chronically ill appearing female, NAD Heart:  Regular rate and rhythm; no murmurs Pulm: Clear anteriorly; no wheezing Abdomen:  Soft, Non-distended AB, Active bowel sounds. No  tenderness, No organomegaly appreciated. Psych:  Cooperative. Normal mood and affect.  Intake/Output from previous day: 08/26 0701 - 08/27 0700 In: 100 [P.O.:100] Out: -  Intake/Output this shift: No intake/output data recorded.  Studies/Results: No results found.  Lab Results: Recent Labs    09/09/22 0450 09/10/22 0327 09/11/22 0503  WBC 7.4 8.1 7.4  HGB 9.5* 10.3* 10.4*  HCT 29.5* 33.0* 31.8*  PLT 336 380 352   BMET Recent Labs    09/09/22 0450 09/10/22 0327 09/11/22 0503  NA 136 143 139  K 3.7 3.8 3.4*  CL 109 110 111  CO2 18* 20* 19*  GLUCOSE 93 105* 103*  BUN 9 9 8   CREATININE 0.59 0.64 0.53  CALCIUM 8.1* 8.7* 8.4*   LFT No results for input(s): "PROT", "ALBUMIN", "AST", "ALT", "ALKPHOS", "BILITOT", "BILIDIR", "IBILI" in the last 72 hours. PT/INR No results for input(s): "LABPROT", "INR" in the last 72 hours.   Scheduled Meds:  sodium chloride   Intravenous Once   atorvastatin  80 mg Oral Daily   clopidogrel  75 mg Oral Daily   pantoprazole  40 mg Oral Daily   potassium chloride  40 mEq Oral Once   Continuous Infusions:   Impression/Plan:   Acute blood loss anemia 09/11/2022  HGB 10.4 MCV 89.1 Platelets 352 09/05/2022 Iron 20 Ferritin 6 B12 214 7/28 EGD with esophagitis dissecans, erosive gastropathy with stigmata of recent bleeding and 2 nonbleeding angiodysplastic lesions in the  duodenum  Enteroscopy and Colon 09/10/2022 Five AVMs duodenum s/p APC, MR clip. Colon adequate prep unremarkable First dose of Plavix this morning, hemoglobin stable at this time If rebleeding, will plan for VCE   Recent Labs    08/13/22 0741 08/29/22 1541 09/05/22 1458 09/05/22 1508 09/06/22 0607 09/07/22 0633 09/08/22 0737 09/09/22 0450 09/10/22 0327 09/11/22 0503  HGB 8.6* 7.9* 6.1* 6.8* 9.4* 9.3* 10.0* 9.5* 10.3* 10.4*   History of right ischemic stroke July Restarted on Plavix this morning  Pancreatic cyst Monitored with MRI every 2  years  Principal Problem:   Acute blood loss anemia Active Problems:   Pancreatic cyst   Aphasia   History of CVA (cerebrovascular accident)   Acute stroke due to ischemia (HCC)   Melena   Heme positive stool   Anemia due to chronic blood loss   Long term (current) use of antithrombotics/antiplatelets   AVM (arteriovenous malformation) of small bowel, acquired with hemorrhage   Stroke-like symptoms    LOS: 6 days   Doree Albee  09/11/2022, 9:32 AM

## 2022-09-11 NOTE — Progress Notes (Signed)
Physical Therapy Treatment Patient Details Name: Melissa Colon MRN: [id number] DOB: April 30, 1960 Today's Date: 09/11/2022   History of Present Illness Pt is a 62 y/o F presenting from [facility] ED as code stroke, MRI brain revealing acute infarcts in medial and inferior R occipital lobes. Also with ABLA. Recent admission in 07/2022 with CIR admission for CVA. PMH includes alcohol dependence, brain tumor    PT Comments  Patient eager to work with PT. Upon standing, noted Rt ankle supinates with pt standing on side of her foot. Reports she has a brace at home (upon further chart review, appears she was using an aircast). Will seek MD to order a new aircast to make transfers OOB safer for pt and staff.     If plan is discharge home, recommend the following: A lot of help with walking and/or transfers;A lot of help with bathing/dressing/bathroom;Assist for transportation;Help with stairs or ramp for entrance   Can travel by private vehicle     No  Equipment Recommendations  None recommended by PT    Recommendations for Other Services       Precautions / Restrictions Precautions Precautions: Fall Precaution Comments: Residual R Hemiparesis (prior CVAs) Required Braces or Orthoses: Other Brace Other Brace: per chart, patient was using aircast on RLE due to inversion. Patient unsure if she has this brace at home. Restrictions Weight Bearing Restrictions: No     Mobility  Bed Mobility Overal bed mobility: Needs Assistance Bed Mobility: Supine to Sit     Supine to sit: Min assist     General bed mobility comments: pt able to come to sitting without assist, however could not scoot out to get her feet to the floor without assist    Transfers Overall transfer level: Needs assistance Equipment used: Rolling walker (2 wheels), None Transfers: Sit to/from Stand, Bed to chair/wheelchair/BSC Sit to Stand: Mod assist Stand pivot transfers: Mod assist, +2 physical assistance (toward her  left due to lack of brace for Rt ankle)         General transfer comment: stood from EOB x 3 reps; initially with RW however RUE shoots into extension and RLE knee hyperextends and ankle supinates; stood again without RW with Rt ankle supported/blocked from turning over; 3rd stand with pivot to chair on her left    Ambulation/Gait                   Stairs             Wheelchair Mobility     Tilt Bed    Modified Rankin (Stroke Patients Only) Modified Rankin (Stroke Patients Only) Pre-Morbid Rankin Score: Moderately severe disability Modified Rankin: Severe disability     Balance Overall balance assessment: Needs assistance Sitting-balance support: Bilateral upper extremity supported Sitting balance-Leahy Scale: Fair     Standing balance support: Bilateral upper extremity supported, During functional activity, Reliant on assistive device for balance Standing balance-Leahy Scale: Poor Standing balance comment: Pt requires physical assist externally to stay standing.                            Cognition Arousal: Alert Behavior During Therapy: Flat affect, Lability Overall Cognitive Status: No family/caregiver present to determine baseline cognitive functioning                                 General Comments: decr processing time, delayed responses,  orientation NT        Exercises      General Comments General comments (skin integrity, edema, etc.): Became tearful when RN in to tell her that her family is coming to see her      Pertinent Vitals/Pain Pain Assessment Pain Assessment: No/denies pain    Home Living                          Prior Function            PT Goals (current goals can now be found in the care plan section) Acute Rehab PT Goals Patient Stated Goal: To return home with spouse and son Time For Goal Achievement: 09/23/22 Potential to Achieve Goals: Fair Progress towards PT goals:  Progressing toward goals    Frequency    Min 1X/week      PT Plan      Co-evaluation              AM-PAC PT "6 Clicks" Mobility   Outcome Measure  Help needed turning from your back to your side while in a flat bed without using bedrails?: A Little Help needed moving from lying on your back to sitting on the side of a flat bed without using bedrails?: A Little Help needed moving to and from a bed to a chair (including a wheelchair)?: Total Help needed standing up from a chair using your arms (e.g., wheelchair or bedside chair)?: A Lot Help needed to walk in hospital room?: Total Help needed climbing 3-5 steps with a railing? : Total 6 Click Score: 11    End of Session Equipment Utilized During Treatment: Gait belt Activity Tolerance: Patient tolerated treatment well Patient left: in chair;with call bell/phone within reach;with chair alarm set Nurse Communication: Mobility status;Need for lift equipment (possible use of stedy; watch rt ankle) PT Visit Diagnosis: Other abnormalities of gait and mobility (R26.89);Muscle weakness (generalized) (M62.81);Hemiplegia and hemiparesis Hemiplegia - Right/Left: Right Hemiplegia - dominant/non-dominant: Dominant Hemiplegia - caused by:  (Hx of CVAs)     Time: 1203-1223 PT Time Calculation (min) (ACUTE ONLY): 20 min  Charges:    $Gait Training: 8-22 mins PT General Charges $$ ACUTE PT VISIT: 1 Visit                      Jerolyn Center, PT Acute Rehabilitation Services  Office [phone]    Zena Amos 09/11/2022, 1:52 PM

## 2022-09-11 NOTE — Progress Notes (Signed)
PROGRESS NOTE        PATIENT DETAILS Name: Melissa Colon Age: 62 y.o. Sex: female Date of Birth: April 11, 1960 Admit Date: 09/05/2022 Admitting Physician Anselm Jungling, DO PCP:Pcp, No  Brief Summary: Patient is a 62 y.o.  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  She was noted to have melanotic stools.  Upon further evaluation she was found to have hemoglobin of 6.1, MRI was positive for CVA.  She was then admitted to the hospitalist service.   Significant events: 7/12-7/14 hospitalization for acute right MCA territory infarct.  Discharged to CIR. 7/16-8/2>> discharged from CIR-following CVA 8/14-8/18>> [facility] ED-abdominal pain/constipation-boarded in [facility] ED as family voiced concern that they were unable to take care of her.  Unfortunately-unable to be placed to SNF-sent home with home health. 8/21>> admit to [facility]-presented to ED with stroke symptoms/GI bleed/severe anemia  Significant studies: 7/13>> echo: EF 65-70%, grade 1 diastolic dysfunction 8/21>> MRI brain: Acute infarct right PCA territory 8/21>> CT angio head/neck: Intracranial branch atherosclerosis similar to prior studies-advanced bilateral PCA narrowing. 8/21>> CT angio chest: No PE. 8/21>> CT abdomen/pelvis: No acute abnormality. 8/22>> A1c: 5.4 8/22>> LDL: 30 open  09/10/2018 for EGD and colonoscopy by [place] gastroenterologist Dr. Gae Bon Corigliano  EGD   Impression:               - Normal esophagus.                           - 3 cm hiatal hernia.                           - Normal stomach.                           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                            Treated with argon plasma coagulation (APC). Clip                            (MR conditional) was  placed. Clip manufacturer:                            [employer].                           - No specimens collected. Recommendation:           - Perform a colonoscopy today.                           - Additional recommendations pending colonoscopy                            findings.                           -  If continued or recurrent bleeding, consider                            Video Capsule Endoscopy for further small bowel                            interrogation for additional AVMs in the mid and                            distal small bowel.  Colonoscopy   Impression:               - The entire examined colon is normal.                           - Stool in the sigmoid colon, in the ascending                            colon and in the cecum.                           - The examined portion of the ileum was normal.                           - The distal rectum and anal verge are normal on                            retroflexion view.                           - No specimens collected. Recommendation:           - Return patient to hospital ward for ongoing care.                           - Advance diet as tolerated.                           - Continue present medications.                           - Ok to restart antiplatelet therapy and systemic                            anticoagulation. Would start with heparin initially.                           - Continue serial CBC checks particularly after                            starting antiplatelets.    Significant microbiology data: None  Procedures: None  Consults: GI Neurology  Subjective:   Patient in bed, appears comfortable, denies any headache, no fever, no chest pain or pressure, no shortness of breath , no abdominal pain. No new focal weakness.   Objective: Vitals: Blood pressure 113/69, pulse 69, temperature 97.9 F (36.6 C), temperature source Oral,  resp. rate 18, weight 70.2 kg, SpO2 98%.    Exam:  Awake, does have some dysarthria, mild right-sided facial droop, left sided weakness left arm most weak, some chronic visual disturbance, [place].AT,PERRAL Supple Neck, No JVD,   Symmetrical Chest wall movement, Good air movement bilaterally, CTAB RRR,No Gallops, Rubs or new Murmurs,  +ve B.Sounds, Abd Soft, No tenderness,   No Cyanosis, Clubbing or edema    Assessment/Plan:  Acute blood loss anemia secondary to presumed upper GI bleeding Likely secondary to AVMs (seen on prior EGD in July 2024) in the setting of DAPT use for recent CVA GI bleeding seems to have resolved Continue PPI twice daily Hb stable Required 2 units of PRBC on admission-none since then. 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 Treated with argon plasma coagulation (APC). Clip was placed, case discussed with gastroenterologist Dr. Nolon Stalls okay to resume antiplatelet single regimen, will start on Plavix with caution and monitor.                            Acute right PCA territory infarct this admission, likely in the setting of large vessel intracranial disease exacerbated by hypotension caused by severe blood loss anemia Recent history of right MCA territory in July 2024 infarct on DAPT History of ICH versus hemorrhagic conversion 2021 Acute infarct felt to be due to hypotension/acute blood loss-overall improved following blood transfusion. Antiplatelets on hold due to active GI bleeding Workup as above for CVA Visual deficits and left upper extremity deficits appear essentially unchanged. No further recommendations from neurology-zoomed Plavix on 09/11/2022 as above continue statin for secondary prevention. Await further recommendations from PT/OT and SLP.  HTN BP stable Not on any antihypertensives  HLD Resume statin when oral intake resumes.  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   Weight as of this encounter: 70.2 kg.   Code status:   Code Status: Full Code   DVT Prophylaxis: SCDs Start: 09/05/22 2309   Family Communication: Spouse and son at bedside on 8/23-none at bedside this morning    Disposition Plan: Status is: Inpatient Remains inpatient appropriate because: Severity of illness   Planned Discharge Destination:Skilled nursing facility   Diet: Diet Order             Diet full liquid Fluid consistency: Thin  Diet effective now                     Antimicrobial agents: Anti-infectives (From admission, onward)    None        MEDICATIONS: Scheduled Meds:  sodium chloride   Intravenous Once   atorvastatin  80 mg Oral Daily   clopidogrel  75 mg Oral Daily   pantoprazole  40 mg Oral Daily   Continuous Infusions: PRN Meds:.acetaminophen, albuterol, LORazepam, ondansetron (ZOFRAN) IV, polyethylene glycol   I have personally reviewed following labs and imaging studies  LABORATORY DATA:  Recent Labs  Lab 09/05/22 1458 09/05/22 1508 09/07/22 0633 09/08/22 0737 09/09/22 0450 09/10/22 0327 09/11/22 0503  WBC  13.9*   < > 8.5 8.9 7.4 8.1 7.4  HGB 6.1*   < > 9.3* 10.0* 9.5* 10.3* 10.4*  HCT 20.4*   < > 29.4* 31.2* 29.5* 33.0* 31.8*  PLT 499*   < > 299 351 336 380 352  MCV 84.6   < > 86.7 88.4 87.3 87.8 89.1  MCH 25.3*   < > 27.4 28.3 28.1 27.4 29.1  MCHC 29.9*   < > 31.6 32.1 32.2 31.2 32.7  RDW 15.6*   < > 16.1* 16.2* 16.9* 17.2* 17.0*  LYMPHSABS 1.6  --   --   --   --   --   --   MONOABS 0.7  --   --   --   --   --   --   EOSABS 0.0  --   --   --   --   --   --    BASOSABS 0.1  --   --   --   --   --   --    < > = values in this interval not displayed.    Recent Labs  Lab 09/05/22 1458 09/05/22 1508 09/05/22 1722 09/06/22 0605 09/06/22 0607 09/07/22 2725 09/08/22 0737 09/09/22 0450 09/10/22 0327 09/11/22 0503  NA 139   < >  --   --    < > 141 138 136 143 139  K 3.5   < >  --   --    < > 3.3* 3.2* 3.7 3.8 3.4*  CL 110   < >  --   --    < > 112* 106 109 110 111  CO2 18*  --   --   --    < > 21* 18* 18* 20* 19*  ANIONGAP 11  --   --   --    < > 8 14 9 13 9   GLUCOSE 121*   < >  --   --    < > 78 75 93 105* 103*  BUN 23*   < >  --   --    < > 6 7 9 9 8   CREATININE 0.79   < >  --   --    < > 0.53 0.74 0.59 0.64 0.53  AST 18  --   --   --   --   --   --   --   --   --   ALT 21  --   --   --   --   --   --   --   --   --   ALKPHOS 121  --   --   --   --   --   --   --   --   --   BILITOT 0.6  --   --   --   --   --   --   --   --   --   ALBUMIN 3.3*  --   --   --   --   --   --   --   --   --   INR 1.1  --   --   --   --   --   --   --   --   --   HGBA1C  --   --   --  5.4  --   --   --   --   --   --  BNP  --   --  53.4  --   --   --   --   --   --   --   MG  --   --   --   --   --   --  2.0 2.0 2.1 2.0  CALCIUM 8.6*  --   --   --    < > 8.0* 8.0* 8.1* 8.7* 8.4*   < > = values in this interval not displayed.    Lab Results  Component Value Date   CHOL 84 09/06/2022   HDL 27 (L) 09/06/2022   LDLCALC 37 09/06/2022   TRIG 101 09/06/2022   CHOLHDL 3.1 09/06/2022     LOS: 6 days   Signature  -    Susa Raring M.D on 09/11/2022 at 8:55 AM   -  To page go to [email or web address]

## 2022-09-11 NOTE — TOC Progression Note (Signed)
Transition of Care [facility]) - Progression Note    Patient Details  Name: Melissa Colon MRN: [id number] Date of Birth: Apr 21, 1960  Transition of [facility]) CM/SW Contact  Mearl Latin, LCSW Phone Number: 09/11/2022, 3:06 PM  Clinical Narrative:    CSW discussed case with Inpatient Rehab Coordinator as per brother's request. The concern is lack of support and need for long term assistance. Financial Counseling responded and is going to assist with Medicaid/Disability applications.      Barriers to Discharge: Continued Medical Work up, English as a second language teacher, Inadequate or no Clinical research associate and Services In-house Referral: Clinical Social Work, Engineer, materials: [employer] Consult Post Acute Care Choice: Home Health, IP Rehab Living arrangements for the past 2 months: Single Family Home                                       Social Determinants of Health (SDOH) Interventions SDOH Screenings   Food Insecurity: Patient Declined (07/28/2022)  Housing: Patient Declined (07/28/2022)  Transportation Needs: Patient Declined (07/28/2022)  Utilities: Patient Declined (07/28/2022)  Tobacco Use: High Risk (09/10/2022)    Readmission Risk Interventions     No data to display

## 2022-09-12 DIAGNOSIS — D500 Iron deficiency anemia secondary to blood loss (chronic): Secondary | ICD-10-CM | POA: Diagnosis not present

## 2022-09-12 DIAGNOSIS — Z7902 Long term (current) use of antithrombotics/antiplatelets: Secondary | ICD-10-CM | POA: Diagnosis not present

## 2022-09-12 DIAGNOSIS — R195 Other fecal abnormalities: Secondary | ICD-10-CM | POA: Diagnosis not present

## 2022-09-12 LAB — BASIC METABOLIC PANEL
Anion gap: 7 (ref 5–15)
BUN: 8 mg/dL (ref 6–20)
CO2: 21 mmol/L — ABNORMAL LOW (ref 22–32)
Calcium: 8.5 mg/dL — ABNORMAL LOW (ref 8.9–10.3)
Chloride: 115 mmol/L — ABNORMAL HIGH (ref 98–111)
Creatinine, Ser: 1.09 mg/dL — ABNORMAL HIGH (ref 0.44–1.00)
GFR, Estimated: 58 mL/min — ABNORMAL LOW (ref >60)
Glucose, Bld: 93 mg/dL (ref 70–99)
Potassium: 3.6 mmol/L (ref 3.5–5.1)
Sodium: 143 mmol/L (ref 135–145)

## 2022-09-12 LAB — CBC
HCT: 32.8 % — ABNORMAL LOW (ref 36.0–46.0)
Hemoglobin: 10.4 g/dL — ABNORMAL LOW (ref 12.0–15.0)
MCH: 27.7 pg (ref 26.0–34.0)
MCHC: 31.7 g/dL (ref 30.0–36.0)
MCV: 87.5 fL (ref 80.0–100.0)
Platelets: 397 10*3/uL (ref 150–400)
RBC: 3.75 MIL/uL — ABNORMAL LOW (ref 3.87–5.11)
RDW: 16.5 % — ABNORMAL HIGH (ref 11.5–15.5)
WBC: 6.5 10*3/uL (ref 4.0–10.5)
nRBC: 0 % (ref 0.0–0.2)

## 2022-09-12 NOTE — Plan of Care (Signed)

## 2022-09-12 NOTE — Progress Notes (Signed)
Orthopedic Tech Progress Note Patient Details:  Melissa Colon 18-Dec-1960 [id number]  Ortho Devices Type of Ortho Device: Ankle Air splint Ortho Device/Splint Location: RLE Ortho Device/Splint Interventions: Ordered      Al Decant 09/12/2022, 6:27 AM
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
## 2022-09-12 NOTE — Progress Notes (Signed)
PROGRESS NOTE        PATIENT DETAILS Name: Melissa Colon Age: 62 y.o. Sex: female Date of Birth: [birth year] Admit Date: 09/05/2022 Admitting Physician Anselm Jungling, DO PCP:Pcp, No  Brief Summary: Patient is a 62 y.o.  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  She was noted to have melanotic stools.  Upon further evaluation she was found to have hemoglobin of 6.1, MRI was positive for CVA.  She was then admitted to the hospitalist service.   Significant events: 7/12-7/14 hospitalization for acute right MCA territory infarct.  Discharged to [facility]. 7/16-8/2>> discharged from [facility]-following CVA 8/14-8/18>> [facility] ED-abdominal pain/constipation-boarded in [facility] ED as family voiced concern that they were unable to take care of her.  Unfortunately-unable to be placed to SNF-sent home with home health. 8/21>> admit to [facility]-presented to ED with stroke symptoms/GI bleed/severe anemia  Significant studies: 7/13>> echo: EF 65-70%, grade 1 diastolic dysfunction 8/21>> MRI brain: Acute infarct right PCA territory 8/21>> CT angio head/neck: Intracranial branch atherosclerosis similar to prior studies-advanced bilateral PCA narrowing. 8/21>> CT angio chest: No PE. 8/21>> CT abdomen/pelvis: No acute abnormality. 8/22>> A1c: 5.4 8/22>> LDL: 30 open  09/10/2018 for EGD and colonoscopy by  gastroenterologist Dr. Gae Bon Corigliano  EGD   Impression:               - Normal esophagus.                           - 3 cm hiatal hernia.                           - Normal stomach.                           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                            Treated with argon plasma coagulation (APC). Clip                            (MR conditional) was  placed. Clip manufacturer:                            [employer].                           - No specimens collected. Recommendation:           - Perform a colonoscopy today.                           - Additional recommendations pending colonoscopy                            findings.                           -  If continued or recurrent bleeding, consider                            Video Capsule Endoscopy for further small bowel                            interrogation for additional AVMs in the mid and                            distal small bowel.  Colonoscopy   Impression:               - The entire examined colon is normal.                           - Stool in the sigmoid colon, in the ascending                            colon and in the cecum.                           - The examined portion of the ileum was normal.                           - The distal rectum and anal verge are normal on                            retroflexion view.                           - No specimens collected. Recommendation:           - Return patient to hospital ward for ongoing care.                           - Advance diet as tolerated.                           - Continue present medications.                           - Ok to restart antiplatelet therapy and systemic                            anticoagulation. Would start with heparin initially.                           - Continue serial CBC checks particularly after                            starting antiplatelets.    Significant microbiology data: None  Procedures: None  Consults: GI Neurology  Subjective:  Patient in bed, appears comfortable, denies any headache, no fever, no chest pain or pressure, no shortness of breath , no abdominal pain. No new focal weakness.   Objective: Vitals: Blood pressure 122/63, pulse 64, temperature 97.7 F (36.5 C), temperature source Oral, resp.  rate 16, weight 70.2 kg, SpO2 94%.    Exam:  Awake, does have some dysarthria, mild right-sided facial droop, left sided weakness left arm most weak, some chronic visual disturbance, Mathews.AT,PERRAL Supple Neck, No JVD,   Symmetrical Chest wall movement, Good air movement bilaterally, CTAB RRR,No Gallops, Rubs or new Murmurs,  +ve B.Sounds, Abd Soft, No tenderness,   No Cyanosis, Clubbing or edema    Assessment/Plan:  Acute blood loss anemia secondary to presumed upper GI bleeding Likely secondary to AVMs (seen on prior EGD in July 2024) in the setting of DAPT use for recent CVA GI bleeding seems to have resolved Continue PPI twice daily Hb stable Required 2 units of PRBC on admission-none since then. 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 Treated with argon plasma coagulation (APC). Clip was placed, case discussed with gastroenterologist Dr. Nolon Stalls okay to resume antiplatelet single regimen, will start on Plavix with caution and monitor.                            Acute right PCA territory infarct this admission, likely in the setting of large vessel intracranial disease exacerbated by hypotension caused by severe blood loss anemia Recent history of right MCA territory in July 2024 infarct on DAPT History of ICH versus hemorrhagic conversion 2021 Acute infarct felt to be due to hypotension/acute blood loss-overall improved following blood transfusion. Antiplatelets on hold due to active GI bleeding Workup as above for CVA Visual deficits and left upper extremity deficits appear essentially unchanged. No further recommendations from neurology-zoomed Plavix on 09/11/2022 as above continue statin for secondary prevention. Await further recommendations from PT/OT and SLP.   HTN BP stable Not on any antihypertensives  HLD Resume statin when oral intake resumes.  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   Weight as of this encounter: 70.2 kg.   Code status:   Code Status: Full Code   DVT Prophylaxis: SCDs Start: 09/05/22 2309   Family Communication: Discussed with patient's brother who is an [employer] fundraiser and living with patient in detail on 09/12/2022, they are preparing for discharge home on 09/13/2022.   Disposition Plan: Status is: Inpatient Remains inpatient appropriate because: Severity of illness   Planned Discharge Destination:Skilled nursing facility   Diet: Diet Order             Diet full liquid Fluid consistency: Thin  Diet effective now                     Antimicrobial agents: Anti-infectives (From admission, onward)    None        MEDICATIONS: Scheduled Meds:  sodium chloride   Intravenous Once   atorvastatin  80 mg Oral Daily   clopidogrel  75 mg Oral Daily   pantoprazole  40 mg Oral Daily   potassium chloride  40 mEq Oral Once   Continuous Infusions: PRN Meds:.acetaminophen, albuterol, LORazepam, ondansetron (ZOFRAN) IV, polyethylene glycol   I have personally reviewed following labs and imaging studies  LABORATORY  DATA:  Recent Labs  Lab 09/05/22 1458 09/05/22 1508 09/08/22 0737 09/09/22 0450 09/10/22 0327 09/11/22 0503 09/12/22 0721  WBC 13.9*   < > 8.9 7.4 8.1 7.4 6.5  HGB 6.1*   < > 10.0* 9.5* 10.3* 10.4* 10.4*  HCT 20.4*   < > 31.2* 29.5* 33.0* 31.8* 32.8*  PLT 499*   < > 351 336 380 352 397  MCV 84.6   < > 88.4 87.3 87.8 89.1 87.5  MCH 25.3*   < > 28.3 28.1 27.4 29.1 27.7  MCHC 29.9*   < > 32.1 32.2 31.2 32.7 31.7  RDW 15.6*   < > 16.2* 16.9* 17.2* 17.0* 16.5*   LYMPHSABS 1.6  --   --   --   --   --   --   MONOABS 0.7  --   --   --   --   --   --   EOSABS 0.0  --   --   --   --   --   --   BASOSABS 0.1  --   --   --   --   --   --    < > = values in this interval not displayed.    Recent Labs  Lab 09/05/22 1458 09/05/22 1508 09/05/22 1722 09/06/22 0605 09/06/22 0607 09/08/22 0737 09/09/22 0450 09/10/22 0327 09/11/22 0503 09/12/22 0721  NA 139   < >  --   --    < > 138 136 143 139 143  K 3.5   < >  --   --    < > 3.2* 3.7 3.8 3.4* 3.6  CL 110   < >  --   --    < > 106 109 110 111 115*  CO2 18*  --   --   --    < > 18* 18* 20* 19* 21*  ANIONGAP 11  --   --   --    < > 14 9 13 9 7   GLUCOSE 121*   < >  --   --    < > 75 93 105* 103* 93  BUN 23*   < >  --   --    < > 7 9 9 8 8   CREATININE 0.79   < >  --   --    < > 0.74 0.59 0.64 0.53 1.09*  AST 18  --   --   --   --   --   --   --   --   --   ALT 21  --   --   --   --   --   --   --   --   --   ALKPHOS 121  --   --   --   --   --   --   --   --   --   BILITOT 0.6  --   --   --   --   --   --   --   --   --   ALBUMIN 3.3*  --   --   --   --   --   --   --   --   --   INR 1.1  --   --   --   --   --   --   --   --   --   HGBA1C  --   --   --  5.4  --   --   --   --   --   --   BNP  --   --  53.4  --   --   --   --   --   --   --   MG  --   --   --   --   --  2.0 2.0 2.1 2.0  --   CALCIUM 8.6*  --   --   --    < > 8.0* 8.1* 8.7* 8.4* 8.5*   < > = values in this interval not displayed.    Lab Results  Component Value Date   CHOL 84 09/06/2022   HDL 27 (L) 09/06/2022   LDLCALC 37 09/06/2022   TRIG 101 09/06/2022   CHOLHDL 3.1 09/06/2022     LOS: 7 days   Signature  -    Susa Raring M.D on 09/12/2022 at 8:47 AM   -  To page go to [email or web address]

## 2022-09-13 ENCOUNTER — Other Ambulatory Visit (HOSPITAL_COMMUNITY): Payer: Self-pay

## 2022-09-13 DIAGNOSIS — D62 Acute posthemorrhagic anemia: Secondary | ICD-10-CM | POA: Diagnosis not present

## 2022-09-13 MED ORDER — PANTOPRAZOLE SODIUM 40 MG PO TBEC
40.0000 mg | DELAYED_RELEASE_TABLET | Freq: Two times a day (BID) | ORAL | 2 refills | Status: AC
  Filled 2022-09-13: qty 60, 30d supply, fill #0

## 2022-09-13 NOTE — Discharge Summary (Signed)
Melissa Colon ZOX:[id number] DOB: 1960-07-28 DOA: 09/05/2022  PCP: Pcp, No  Admit date: 09/05/2022  Discharge date: 09/13/2022  Admitted From: Home   Disposition:  Home   Recommendations for Outpatient Follow-up:   Follow up with PCP in 1-2 weeks  PCP Please obtain BMP/CBC, 2 view CXR in 1week,  (see Discharge instructions)   PCP Please follow up on the following pending results:    Home Health: PT, OT, RN, speech therapy Equipment/Devices: as below  Consultations: GI, Neuro Discharge Condition: Stable    CODE STATUS: Full    Diet Recommendation: Soft diet with feeding assistance and aspiration precautions    Chief Complaint  Patient presents with   Stroke Symptoms     Brief history of present illness from the day of admission and additional interim summary    62 y.o.  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  She was noted to have melanotic stools.  Upon further evaluation she was found to have hemoglobin of 6.1, MRI was positive for CVA.  She was then admitted to the hospitalist service.    Significant events: 7/12-7/14 hospitalization for acute right MCA territory infarct.  Discharged to CIR. 7/16-8/2>> discharged from CIR-following CVA 8/14-8/18>> [facility] ED-abdominal pain/constipation-boarded in [facility] ED as family voiced concern that they were unable to take care of her.  Unfortunately-unable to be placed to SNF-sent home with home health. 8/21>> admit to [facility]-presented to ED with stroke symptoms/GI bleed/severe anemia   Significant studies: 7/13>> echo: EF 65-70%, grade 1 diastolic  dysfunction 8/21>> MRI brain: Acute infarct right PCA territory 8/21>> CT angio head/neck: Intracranial branch atherosclerosis similar to prior studies-advanced bilateral PCA narrowing. 8/21>> CT angio chest: No PE. 8/21>> CT abdomen/pelvis: No acute abnormality. 8/22>> A1c: 5.4 8/22>> LDL: 30 open   09/10/2018 for EGD and colonoscopy by [place] gastroenterologist Dr. Gae Bon Corigliano   EGD    Impression:               - Normal esophagus.                           - 3 cm hiatal hernia.                           - Normal stomach.                           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                            Treated with argon plasma coagulation (APC). Clip                            (  MR conditional) was placed. Clip manufacturer:                            [employer].                           - No specimens collected. Recommendation:           - Perform a colonoscopy today.                           - Additional recommendations pending colonoscopy                            findings.                           - If continued or recurrent bleeding, consider                            Video Capsule Endoscopy for further small bowel                            interrogation for additional AVMs in the mid and                            distal small bowel.   Colonoscopy     Impression:               - The entire examined colon is normal.                           - Stool in the sigmoid colon, in the ascending                            colon and in the cecum.                           - The examined portion of the ileum was normal.                           - The distal rectum and anal verge are normal on                            retroflexion view.                           - No specimens collected. Recommendation:           - Return patient to hospital ward for ongoing care.                           - Advance diet as tolerated.                            - Continue present medications.                           -  Ok to restart antiplatelet therapy and systemic                            anticoagulation. Would start with heparin initially.                           - Continue serial CBC checks particularly after                            starting antiplatelets.                                                                   Hospital Course    Acute blood loss anemia secondary to presumed upper GI bleeding Likely secondary to AVMs (seen on prior EGD in July 2024) in the setting of DAPT use for recent CVA GI bleeding seems to have resolved Continue PPI twice daily Hb stable Required 2 units of PRBC on admission-none since then. 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 Treated with argon plasma coagulation (APC). Clip was placed, case discussed with gastroenterologist Dr. Nolon Stalls okay to resume antiplatelet single regimen, Brilinta started and she has tolerated well for the last 2 days.  No signs of bleeding, will be discharged on the same.                             Acute right PCA territory infarct this admission, likely in the setting of large vessel intracranial disease exacerbated by hypotension caused by severe blood loss anemia Recent history of right MCA territory in July 2024 infarct on DAPT History of ICH versus hemorrhagic conversion 2021 Acute infarct felt to be due to hypotension/acute blood loss-overall improved following blood transfusion. Antiplatelets on hold due to active GI bleeding Workup as above for CVA Visual deficits and left upper extremity deficits appear essentially unchanged. No further recommendations from neurology-zoomed Brilinta on 09/11/2022 as above continue statin for secondary prevention. Will be discharged home with home PT OT and speech if she qualifies.  Does not have benefits for SNF,  discussed with patient's brother who is the primary caregiver along with husband at home, brother is an [employer] fundraiser and comfortable taking her home.     HTN BP stable Not on any antihypertensives   HLD Resume statin    Cystic lesion body of pancreas Incidental finding on CT imaging Stable per radiology MRI in 2 years recommended, to follow-up with PCP and neurology postdischarge.   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   Discharge diagnosis     Principal Problem:   Acute blood loss anemia Active Problems:   Aphasia   Pancreatic cyst   History of CVA (cerebrovascular accident)   Acute stroke due to ischemia (HCC)   Melena   Heme positive stool   Anemia due to chronic blood loss   Long term (current) use of antithrombotics/antiplatelets   AVM (arteriovenous malformation) of small bowel, acquired with hemorrhage  Stroke-like symptoms    Discharge instructions    Discharge Instructions     Discharge instructions   Complete by: As directed    Follow with Primary MD in 7 days   Get CBC, CMP, 2 view Chest X ray -  checked next visit with your primary MD    Activity: As tolerated with Full fall precautions use walker/cane & assistance as needed  Disposition Home    Diet: Soft diet with feeding assistance and aspiration precautions, all feeding in upright sitting position.  Special Instructions: If you have smoked or chewed Tobacco  in the last 2 yrs please stop smoking, stop any regular Alcohol  and or any Recreational drug use.  On your next visit with your primary care physician please Get Medicines reviewed and adjusted.  Please request your Prim.MD to go over all Hospital Tests and Procedure/Radiological results at the follow up, please get all Hospital records sent to your Prim MD by signing hospital release before you go home.  If you experience worsening of your  admission symptoms, develop shortness of breath, life threatening emergency, suicidal or homicidal thoughts you must seek medical attention immediately by calling 911 or calling your MD immediately  if symptoms less severe.  You Must read complete instructions/literature along with all the possible adverse reactions/side effects for all the Medicines you take and that have been prescribed to you. Take any new Medicines after you have completely understood and accpet all the possible adverse reactions/side effects.   Increase activity slowly   Complete by: As directed        Discharge Medications   Allergies as of 09/13/2022   No Known Allergies      Medication List     STOP taking these medications    aspirin EC 81 MG tablet   meclizine 25 MG tablet Commonly known as: ANTIVERT   methocarbamol 750 MG tablet Commonly known as: ROBAXIN       TAKE these medications    atorvastatin 80 MG tablet Commonly known as: LIPITOR Take 1 tablet (80 mg total) by mouth daily.   Brilinta 90 MG Tabs tablet Generic drug: ticagrelor Take 1 tablet (90 mg total) by mouth 2 (two) times daily.   diclofenac Sodium 1 % Gel Commonly known as: VOLTAREN Apply 2 g topically 4 (four) times daily.   folic acid 1 MG tablet Commonly known as: FOLVITE Take 1 tablet (1 mg total) by mouth daily.   multivitamin with minerals Tabs tablet Take 1 tablet by mouth daily.   pantoprazole 40 MG tablet Commonly known as: PROTONIX Take 1 tablet (40 mg total) by mouth 2 (two) times daily before a meal. What changed: when to take this   polyethylene glycol 17 g packet Commonly known as: MIRALAX / GLYCOLAX Take 17 g by mouth daily.         Follow-up Information     Windell Norfolk, MD. Go on 11/06/2022.   Specialty: Neurology Contact information: [address] [place] [address] [phone]         [facility]. Schedule an appointment as soon as possible for a  visit in 1 week(s).   Contact information: [address]     [address] [place] [address] [phone]        [facility]. Schedule an appointment as soon as possible for a visit in 1 month(s).   Specialty: Gastroenterology Contact information: [address] [place] [address] [phone]  Your PCP. Schedule an appointment as soon as possible for a visit in 1 week(s).                  Major procedures and Radiology Reports - PLEASE review detailed and final reports thoroughly  -      MR BRAIN WO CONTRAST  Result Date: 09/05/2022 CLINICAL DATA:  Stroke follow-up EXAM: MRI HEAD WITHOUT CONTRAST TECHNIQUE: Axial and coronal diffusion-weighted sequences of the brain and surrounding structures were obtained without intravenous contrast. COMPARISON:  09/04/2022 MRI head FINDINGS: New areas of restricted diffusion with ADC correlate adjacent to the previously noted diffusion abnormality in the medial right PCA territory, with new foci slightly more inferiorly along the medial right occipital lobe (series 5, image [id number]) and in the more lateral right occipital lobe (series 5, image 65). 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 These findings were discussed by telephone on 09/05/2022 at 11:06 pm with provider ARORA. Electronically Signed   By: Wiliam Ke M.D.   On: 09/05/2022 23:08   CT Angio Chest PE W and/or Wo Contrast  Result Date: 09/05/2022 CLINICAL DATA:  Pulmonary embolism (PE) suspected, high prob shortness of breath. EXAM: CT ANGIOGRAPHY CHEST WITH CONTRAST TECHNIQUE: Multidetector CT imaging of the chest was performed using the standard protocol during bolus administration of intravenous contrast. Multiplanar CT image reconstructions and MIPs were obtained to evaluate the vascular anatomy. RADIATION DOSE REDUCTION: This exam was performed according to the  departmental dose-optimization program which includes automated exposure control, adjustment of the mA and/or kV according to patient size and/or use of iterative reconstruction technique. CONTRAST:  65mL OMNIPAQUE IOHEXOL 350 MG/ML SOLN COMPARISON:  None Available. FINDINGS: Cardiovascular: No evidence of embolism to the proximal subsegmental pulmonary artery level. Normal cardiac size. No pericardial effusion. No aortic aneurysm. There are coronary artery calcifications, in keeping with coronary artery disease. There are also moderate to severe peripheral atherosclerotic vascular calcifications of thoracic aorta and its major branches. Mediastinum/Nodes: Asymmetrically enlarged and heterogeneous right thyroid lobe without discrete nodule is not well evaluated on the current exam. 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 There is short segment marked narrowing versus complete occlusion at the origin of the left vertebral artery, not well evaluated on the current exam. No solid / cystic mediastinal masses. The esophagus is nondistended precluding optimal assessment. No axillary, mediastinal or hilar lymphadenopathy by size criteria. Lungs/Pleura: The central tracheo-bronchial tree is patent. There are patchy areas of linear, plate-like atelectasis and/or scarring throughout bilateral lungs. No mass or consolidation. No pleural effusion or pneumothorax. No suspicious lung nodules. Upper Abdomen: There is a partially exophytic 2.0 x 2.4 cm cyst arising from the left kidney upper pole, posteriorly. There is a 1.6 x 1.8 cm hypoattenuating structure in the posterior aspect of the pancreatic body, not well evaluated on the current exam but unchanged since the prior study dating back to 09/13/2021. Remaining visualized upper abdominal viscera within normal limits. Please refer to simultaneously acquired but separately dictated CT scan abdomen and pelvis report for additional details. Musculoskeletal: The  visualized soft tissues of the chest wall are grossly unremarkable. No suspicious osseous lesions. There are mild multilevel degenerative changes in the visualized spine. Review of the MIP images confirms the above findings. IMPRESSION: 1. No evidence of pulmonary embolism. No acute intrathoracic pathology identified. 2. Short segment marked narrowing versus complete occlusion at the origin of the hypoplastic left vertebral artery, not well evaluated on the current exam.  3. Multiple other nonacute observations, as described above. Aortic Atherosclerosis (ICD10-I70.0). Electronically Signed   By: Jules Schick M.D.   On: 09/05/2022 18:41   CT ABDOMEN PELVIS W CONTRAST  Result Date: 09/05/2022 CLINICAL DATA:  Acute abdominal pain EXAM: CT ABDOMEN AND PELVIS WITH CONTRAST TECHNIQUE: Multidetector CT imaging of the abdomen and pelvis was performed using the standard protocol following bolus administration of intravenous contrast. RADIATION DOSE REDUCTION: This exam was performed according to the departmental dose-optimization program which includes automated exposure control, adjustment of the mA and/or kV according to patient size and/or use of iterative reconstruction technique. CONTRAST:  65mL OMNIPAQUE IOHEXOL 350 MG/ML SOLN COMPARISON:  CT abdomen and pelvis 08/29/2022 FINDINGS: Lower chest: No acute abnormality. Hepatobiliary: No focal liver abnormality is seen. No gallstones, gallbladder wall thickening, or biliary dilatation. Pancreas: Cystic pancreatic lesion in the body of the pancreas measures 14 mm and appears unchanged. The pancreas is otherwise within normal limits. Spleen: Normal in size without focal abnormality. Adrenals/Urinary Tract: There is contrast in the bladder and renal collecting systems which limits evaluation for small calculi. There is no hydronephrosis or perinephric fat stranding. There is a cyst in the superior pole of the left kidney measuring 2.1 cm. The adrenal glands and bladder  are within normal limits. Stomach/Bowel: There is a small hiatal hernia. Stomach is otherwise within normal limits. Appendix appears normal. No evidence of bowel wall thickening, distention, or inflammatory changes. Vascular/Lymphatic: Aortic atherosclerosis. No enlarged abdominal or pelvic lymph nodes. Reproductive: Uterus and bilateral adnexa are unremarkable. Other: No abdominal wall hernia or abnormality. No abdominopelvic ascites. Musculoskeletal: No acute or significant osseous findings. IMPRESSION: 1. No acute localizing process in the abdomen or pelvis. 2. Small hiatal hernia. 3. Stable cystic lesion in the body of the pancreas. Recommend follow-up MRI in 2 years to confirm stability. 4. Left Bosniak I benign renal cyst measuring 2.1 cm. No follow-up imaging is recommended. JACR 2018 Feb; 264-273, Management of the Incidental Renal Mass on CT, [employer] 2021; [id number], Bosniak Classification of Cystic Renal Masses, Version 2019. Aortic Atherosclerosis (ICD10-I70.0). Electronically Signed   By: Darliss Cheney M.D.   On: 09/05/2022 18:31   CT ANGIO HEAD NECK W WO CM W PERF (CODE STROKE)  Result Date: 09/05/2022 CLINICAL DATA:  Acute stroke suspected EXAM: CT ANGIOGRAPHY HEAD AND NECK CT PERFUSION BRAIN TECHNIQUE: Multidetector CT imaging of the head and neck was performed using the standard protocol during bolus administration of intravenous contrast. Multiplanar CT image reconstructions and MIPs were obtained to evaluate the vascular anatomy. Carotid stenosis measurements (when applicable) are obtained utilizing NASCET criteria, using the distal internal carotid diameter as the denominator. Multiphase CT imaging of the brain was performed following IV bolus contrast injection. Subsequent parametric perfusion maps were calculated using RAP[employer] software. RADIATION DOSE REDUCTION: This exam was performed according to the departmental dose-optimization program which includes automated exposure control,  adjustment of the mA and/or kV according to patient size and/or use of iterative reconstruction technique. CONTRAST:  OMNIPAQUE IOHEXOL 350 MG/ML SOLN COMPARISON:  07/27/2022 FINDINGS: CTA NECK FINDINGS Aortic arch: Atheromatous wall thickening. Three vessel branching. No acute finding Right carotid system: Mild atheromatous plaque at the bifurcation without significant stenosis. No ulceration or beading Left carotid system: No significant stenosis, ulceration, or beading Vertebral arteries: No proximal subclavian stenosis. The right vertebral artery is dominant. Severe stenosis with non visible lumen at the left vertebral origin. The left vertebral artery is diffusely attenuated appearing relative to the transverse foramen size.  Skeleton: No acute finding.  Occipital craniectomy. Other neck: No acute finding. Upper chest: Mildly focal emphysema.  No acute finding Review of the MIP images confirms the above findings CTA HEAD FINDINGS Anterior circulation: Atheromatous calcification along the carotid siphons. No major branch occlusion, beading, or flow reducing stenosis. Widespread intracranial atheromatous irregularity with high-grade branch narrowing seen diffusely, affecting the ACA and MCA circulation bilaterally. Difficult arterial visualization of the left MCA bifurcation due to closely neighboring crossing vein. No aneurysm or vascular malformation Posterior circulation: Atheromatous irregularity of the right vertebral and basilar arteries without flow reducing stenosis. Tiny left vertebral artery with limited contribution to the posterior circulation. Extensive atheromatous type irregularity of the posterior cerebral arteries with flow gap at the right P2 segment and high-grade narrowings at the left P2 and P3. Venous sinuses: Unremarkable for arterial timing Anatomic variants: None significant Review of the MIP images confirms the above findings CT Brain Perfusion Findings: CBF (<30%) Volume: 3mL  Perfusion (Tmax>6.0s) volume: 13mL Mismatch Volume: 10mL Infarction Location:Right parasagittal parietal lobe, site of restricted diffusion on preceding CT with core infarct underestimated compared to MRI. IMPRESSION: 1. No emergent finding. 2. Advanced intracranial branch atherosclerosis similar to priors. Especially advanced bilateral PCA narrowing with flow gap at the right P2 segment 3. Thready flow throughout the left vertebral artery with severe stenosis or occlusion at the origin. 4. CT perfusion deficit matching brain MRI from yesterday. Electronically Signed   By: Tiburcio Pea M.D.   On: 09/05/2022 15:39   CT HEAD CODE STROKE WO CONTRAST  Result Date: 09/05/2022 CLINICAL DATA:  Code stroke.  Right-sided weakness EXAM: CT HEAD WITHOUT CONTRAST TECHNIQUE: Contiguous axial images were obtained from the base of the skull through the vertex without intravenous contrast. RADIATION DOSE REDUCTION: This exam was performed according to the departmental dose-optimization program which includes automated exposure control, adjustment of the mA and/or kV according to patient size and/or use of iterative reconstruction technique. COMPARISON:  CT Head 09/04/22, MR Head 09/04/22 FINDINGS: Brain: No hemorrhage. No hydrocephalus. No extra-axial fluid collection. Redemonstrated right PCA territory infarct with chronic calcifications along the medial aspect of the bilateral occipital lobes. 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 Unchanged chronic calcification in the left cerebellar hemisphere. No CT evidence of an acute cortical infarct. Unchanged chronic infarct in the left parietal lobe. Unchanged size and shape of the ventricular system. Vascular: No hyperdense vessel or unexpected calcification. Skull: Prior suboccipital craniotomy. Sinuses/Orbits: No middle ear or mastoid effusion. Paranasal sinuses clear. Orbits are unremarkable.  Other: None. ASPECTS [facility] Stroke Program Early CT Score): 10 when accounting for chronic findings. IMPRESSION: 1. No hemorrhage or CT evidence of an acute cortical infarct. 2. Redemonstrated right PCA territory infarct with chronic calcifications along the medial aspect of the bilateral occipital lobes. 3. 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 Findings were paged to Dr. Roda Shutters on 09/05/22 at 3:19 PM via [facility] paging system. Electronically Signed   By: Lorenza Cambridge M.D.   On: 09/05/2022 15:23   MR BRAIN WO CONTRAST  Result Date: 09/04/2022 CLINICAL DATA:  62 year old female with neurologic deficit and new vision changes. Right PCA territory ischemia last month. EXAM: MRI HEAD WITHOUT CONTRAST TECHNIQUE: Multiplanar, multiecho pulse sequences of the brain and surrounding structures were obtained without intravenous contrast. COMPARISON:  Head CT 0027 hours today. Recent brain MRI 08/07/2022 and earlier. FINDINGS: Brain: Persistent diffusion abnormality in the  medial right PCA territory, with a similar configuration and slightly less intense than demonstrated on 08/07/2022. Some of the diffusion there remains restricted. Mild cytotoxic edema. No malignant hemorrhagic transformation or mass effect. No restricted diffusion in any other vascular territory. Chronic infarcts with encephalomalacia in the posterior left MCA and left MCA/PCA watershed, bilateral cerebellum (severe on the right), bilateral deep gray nuclei and right brainstem. Confluent chronic blood products in the left cerebral peduncle, right cerebellar hemisphere. Fairly numerous additional chronic microhemorrhages which are concentrated in the vascular territories described above appear stable from last month. No superimposed midline shift, mass effect, evidence of mass lesion, ventriculomegaly, extra-axial collection or acute intracranial hemorrhage. Cervicomedullary  junction and pituitary are within normal limits. Vascular: Major intracranial vascular flow voids are stable. Chronically abnormal distal left vertebral artery. See CTA head and neck last month. Skull and upper cervical spine: Cervical spine motion artifact today. Bone marrow signal remains normal. Sinuses/Orbits: Stable, negative orbits and sinuses. Other: Stable mild left mastoid air cell fluid. Visible internal auditory structures appear normal. IMPRESSION: 1. Unresolved Right PCA territory ischemia since last month, but no significant progression. No malignant hemorrhagic transformation or associated mass effect. 2. Otherwise stable advanced chronic ischemic disease in the brain with multiple areas of chronic hemorrhage. 3. No new intracranial abnormality. Electronically Signed   By: Odessa Fleming M.D.   On: 09/04/2022 05:48   DG Chest Portable 1 View  Result Date: 09/04/2022 CLINICAL DATA:  MRI clearance EXAM: PORTABLE CHEST 1 VIEW COMPARISON:  None Available. FINDINGS: The heart size and mediastinal contours are within normal limits. Both lungs are clear. The visualized skeletal structures are unremarkable. No unexpected metallic foreign body within the visualized thorax. IMPRESSION: No active disease. Electronically Signed   By: Helyn Numbers M.D.   On: 09/04/2022 03:53   CT Head Wo Contrast  Result Date: 09/04/2022 CLINICAL DATA:  Acute neurologic deficit, vision change EXAM: CT HEAD WITHOUT CONTRAST TECHNIQUE: Contiguous axial images were obtained from the base of the skull through the vertex without intravenous contrast. RADIATION DOSE REDUCTION: This exam was performed according to the departmental dose-optimization program which includes automated exposure control, adjustment of the mA and/or kV according to patient size and/or use of iterative reconstruction technique. COMPARISON:  MRI 08/07/2022 FINDINGS: Brain: Evolving subacute infarct within the right parafalcine occipital lobe. Remote cortical  infarct within the left parietal cortex, lacunar infarcts within the left basal ganglia and left thalamus, and extensive encephalomalacia within the right cerebellar hemisphere which changes of right suboccipital craniotomy again noted. No acute intracranial hemorrhage or infarct. No abnormal mass effect or midline shift. Dystrophic calcifications again noted within the except ule cortices bilaterally and left cerebellar hemisphere. Ventricular size is normal. Vascular: No hyperdense vessel or unexpected calcification. Skull: Normal. Negative for fracture or focal lesion. Sinuses/Orbits: No acute finding. Other: Mastoid air cells and middle ear cavities are clear. IMPRESSION: 1. Evolving subacute infarct within the right parafalcine occipital lobe. No acute intracranial hemorrhage or infarct. No abnormal mass effect or midline shift. 2. Stable encephalomalacia within the right cerebellar hemisphere with changes of right suboccipital craniotomy. 3. Stable remote infarcts as outlined above. Electronically Signed   By: Helyn Numbers M.D.   On: 09/04/2022 01:00   CT ABDOMEN PELVIS W CONTRAST  Result Date: 08/29/2022 CLINICAL DATA:  Left lower quadrant abdominal pain, constipation EXAM: CT ABDOMEN AND PELVIS WITH CONTRAST TECHNIQUE: Multidetector CT imaging of the abdomen and pelvis was performed using the standard protocol following bolus administration of  intravenous contrast. RADIATION DOSE REDUCTION: This exam was performed according to the departmental dose-optimization program which includes automated exposure control, adjustment of the mA and/or kV according to patient size and/or use of iterative reconstruction technique. CONTRAST:  OMNIPAQUE IOHEXOL 300 MG/ML  SOLN COMPARISON:  09/13/2021 FINDINGS: Lower chest: Mild scarring/atelectasis in the medial right lower lobe. Hepatobiliary: Liver is within normal limits. Gallbladder is unremarkable. No intrahepatic or extrahepatic dilatation. Pancreas: 18 mm  unilocular cyst along the posterior aspect of the pancreatic tail (series 2/image 20), unchanged. No pancreatic ductal dilatation. Spleen: Within normal limits. Adrenals/Urinary Tract: Adrenal glands are within normal limits. Two simple left renal cysts, measuring up to 2.6 cm in the lower pole (series 2/image 35), benign (Bosniak I). No follow-up is recommended. Right kidney is within normal limits. Mild left hydronephrosis. Associated punctate distal left ureteral calculus at the UVJ (series 2/image 71). Bladder is underdistended but unremarkable. Stomach/Bowel: Stomach is notable for a small hiatal hernia. No evidence of bowel obstruction. Normal appendix (series 2/image 57). No colonic wall thickening or inflammatory changes. Vascular/Lymphatic: No evidence of abdominal aortic aneurysm. Atherosclerotic calcifications of the abdominal aorta and branch vessels, although vessels remain patent. No suspicious abdominopelvic lymphadenopathy. Reproductive: Uterus is within normal limits. Bilateral ovaries are within normal limits. Other: No abdominopelvic ascites. Musculoskeletal: Visualized osseous structures are within normal limits. IMPRESSION: Punctate distal left ureteral calculus at the UVJ. Associated mild left hydronephrosis. 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 Follow-up MRI abdomen with/without contrast is suggested in 1 year. Electronically Signed   By: Charline Bills M.D.   On: 08/29/2022 18:10   DG Toe Great Right  Result Date: 08/15/2022 CLINICAL DATA:  Right great toe pain.  Bruise.  No known injury. EXAM: RIGHT GREAT TOE COMPARISON:  None Available. FINDINGS: There is diffuse decreased bone mineralization. No acute fracture is seen. Mild hallux valgus. No significant joint space narrowing. No dislocation. IMPRESSION: 1. No acute fracture. 2. Mild hallux valgus. Electronically Signed   By: Neita Garnet M.D.   On: 08/15/2022 12:05     Micro Results    No results found for this or any previous visit (from the past 240 hour(s)).  Today   Subjective    Melissa Colon today has no headache,no chest abdominal pain,no new weakness tingling or numbness, feels much better wants to go home today.    Objective   Blood pressure (!) 104/57, pulse 65, temperature 98.1 F (36.7 C), temperature source Oral, resp. rate 18, weight 70.2 kg, SpO2 94%.   Intake/Output Summary (Last 24 hours) at 09/13/2022 0848 Last data filed at 09/12/2022 1420 Gross per 24 hour  Intake 240 ml  Output --  Net 240 ml    Exam  Awake, does have some dysarthria, mild right-sided facial droop, left sided weakness left arm most weak, some chronic visual disturbance,    [place].AT,PERRAL Supple Neck,   Symmetrical Chest wall movement, Good air movement bilaterally, CTAB RRR,No Gallops,   +ve B.Sounds, Abd Soft, Non tender,  No Cyanosis, Clubbing or edema    Data Review   Recent Labs  Lab 09/08/22 0737 09/09/22 0450 09/10/22 0327 09/11/22 0503 09/12/22 0721  WBC 8.9 7.4 8.1 7.4 6.5  HGB 10.0* 9.5* 10.3* 10.4* 10.4*  HCT 31.2* 29.5* 33.0* 31.8* 32.8*  PLT 351 336 380 352 397  MCV 88.4 87.3 87.8 89.1 87.5  MCH 28.3 28.1 27.4 29.1 27.7  MCHC 32.1 32.2 31.2 32.7 31.7  RDW 16.2* 16.9* 17.2* 17.0* 16.5*    Recent Labs  Lab 09/08/22 0737 09/09/22 0450 09/10/22 0327 09/11/22 0503 09/12/22 0721  NA 138 136 143 139 143  K 3.2* 3.7 3.8 3.4* 3.6  CL 106 109 110 111 115*  CO2 18* 18* 20* 19* 21*  ANIONGAP 14 9 13 9 7   GLUCOSE 75 93 105* 103* 93  BUN 7 9 9 8 8   CREATININE 0.74 0.59 0.64 0.53 1.09*  MG 2.0 2.0 2.1 2.0  --   CALCIUM 8.0* 8.1* 8.7* 8.4* 8.5*    Total Time in preparing paper work, data evaluation and todays exam - 35 minutes  Signature  -    Susa Raring M.D on 09/13/2022 at 8:48 AM   -  To page go to [email or web address]

## 2022-09-13 NOTE — Discharge Instructions (Signed)
Follow with Primary MD in 7 days   Get CBC, CMP, 2 view Chest X ray -  checked next visit with your primary MD    Activity: As tolerated with Full fall precautions use walker/cane & assistance as needed  Disposition Home    Diet: Soft diet with feeding assistance and aspiration precautions, all feeding in upright sitting position.  Special Instructions: If you have smoked or chewed Tobacco  in the last 2 yrs please stop smoking, stop any regular Alcohol  and or any Recreational drug use.  On your next visit with your primary care physician please Get Medicines reviewed and adjusted.  Please request your Prim.MD to go over all Hospital Tests and Procedure/Radiological results at the follow up, please get all Hospital records sent to your Prim MD by signing hospital release before you go home.  If you experience worsening of your admission symptoms, develop shortness of breath, life threatening emergency, suicidal or homicidal thoughts you must seek medical attention immediately by calling 911 or calling your MD immediately  if symptoms less severe.  You Must read complete instructions/literature along with all the possible adverse reactions/side effects for all the Medicines you take and that have been prescribed to you. Take any new Medicines after you have completely understood and accpet all the possible adverse reactions/side effects.

## 2022-09-13 NOTE — TOC Transition Note (Signed)
Transition of Care [facility]) - CM/SW Discharge Note   Patient Details  Name: Melissa Colon MRN: [id number] Date of Birth: [birth year]  Transition of Care [facility] CM/SW Contact:  Gordy Clement, RN Phone Number: 09/13/2022, 10:27 AM   Clinical Narrative:     Patient will DC to home today. [facility] will be picking patient up at 3:00 pm.  Floor RN to  call  pt's brother [name] [phone] when she is picked up. Frances Furbish will provide HH. AVS updated   No additional TOC needs       Barriers to Discharge: Continued Medical Work up, English as a second language teacher, Inadequate or no insurance   Patient Goals and CMS Choice CMS Medicare.gov Compare Post Acute Care list provided to:: Patient Represenative (must comment) Choice offered to / list presented to : Adult Children  Discharge Placement                         Discharge Plan and Services Additional resources added to the After Visit Summary for   In-house Referral: Clinical Social Work, Artist Discharge Planning Services: CM Consult Post Acute Care Choice: Home Health, IP Rehab                               Social Determinants of Health (SDOH) Interventions SDOH Screenings   Food Insecurity: Patient Declined (07/28/2022)  Housing: Patient Declined (07/28/2022)  Transportation Needs: Patient Declined (07/28/2022)  Utilities: Patient Declined (07/28/2022)  Tobacco Use: High Risk (09/10/2022)     Readmission Risk Interventions     No data to display

## 2022-09-13 NOTE — Plan of Care (Signed)

## 2022-09-13 NOTE — Progress Notes (Signed)
This pts brother in laws(Jason) is informed about transfer after Sharin Mons has arrived.

## 2022-09-13 NOTE — Plan of Care (Signed)

## 2022-09-18 ENCOUNTER — Emergency Department
Admission: EM | Admit: 2022-09-18 | Discharge: 2022-09-19 | Disposition: A | Discharge status: Home / Self Care | Payer: Medicaid Other | Attending: Emergency Medicine | Admitting: Emergency Medicine

## 2022-09-18 ENCOUNTER — Other Ambulatory Visit: Payer: Self-pay

## 2022-09-18 ENCOUNTER — Encounter: Payer: Self-pay | Admitting: Emergency Medicine

## 2022-09-18 ENCOUNTER — Emergency Department: Payer: Medicaid Other

## 2022-09-18 DIAGNOSIS — R1013 Epigastric pain: Secondary | ICD-10-CM | POA: Diagnosis present

## 2022-09-18 LAB — CBC
HCT: 35.7 % — ABNORMAL LOW (ref 36.0–46.0)
Hemoglobin: 11 g/dL — ABNORMAL LOW (ref 12.0–15.0)
MCH: 27.5 pg (ref 26.0–34.0)
MCHC: 30.8 g/dL (ref 30.0–36.0)
MCV: 89.3 fL (ref 80.0–100.0)
Platelets: 453 10*3/uL — ABNORMAL HIGH (ref 150–400)
RBC: 4 MIL/uL (ref 3.87–5.11)
RDW: 16.1 % — ABNORMAL HIGH (ref 11.5–15.5)
WBC: 11 10*3/uL — ABNORMAL HIGH (ref 4.0–10.5)
nRBC: 0 % (ref 0.0–0.2)

## 2022-09-18 LAB — COMPREHENSIVE METABOLIC PANEL
ALT: 33 U/L (ref 0–44)
AST: 23 U/L (ref 15–41)
Albumin: 3.7 g/dL (ref 3.5–5.0)
Alkaline Phosphatase: 125 U/L (ref 38–126)
Anion gap: 9 (ref 5–15)
BUN: 7 mg/dL (ref 6–20)
CO2: 23 mmol/L (ref 22–32)
Calcium: 8.9 mg/dL (ref 8.9–10.3)
Chloride: 110 mmol/L (ref 98–111)
Creatinine, Ser: 0.69 mg/dL (ref 0.44–1.00)
GFR, Estimated: >60 mL/min (ref >60)
Glucose, Bld: 98 mg/dL (ref 70–99)
Potassium: 3.2 mmol/L — ABNORMAL LOW (ref 3.5–5.1)
Sodium: 142 mmol/L (ref 135–145)
Total Bilirubin: 0.6 mg/dL (ref 0.3–1.2)
Total Protein: 7.5 g/dL (ref 6.5–8.1)

## 2022-09-18 LAB — TROPONIN I (HIGH SENSITIVITY): Troponin I (High Sensitivity): 3 ng/L (ref <18)

## 2022-09-18 LAB — LIPASE, BLOOD: Lipase: 53 U/L — ABNORMAL HIGH (ref 11–51)

## 2022-09-18 MED ORDER — ALUM & MAG HYDROXIDE-SIMETH 200-200-20 MG/5ML PO SUSP
30.0000 mL | Freq: Once | ORAL | Status: AC
  Administered 2022-09-18: 30 mL via ORAL
  Filled 2022-09-18: qty 30

## 2022-09-18 MED ORDER — PANTOPRAZOLE SODIUM 40 MG PO TBEC: 40.0000 mg | DELAYED_RELEASE_TABLET | Freq: Every day | ORAL | Status: DC

## 2022-09-18 NOTE — ED Triage Notes (Addendum)
Pt arrived via [facility] from home with reports R side chest pain and epigastric pain and nausea. Denies any nausea.  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   Pt recently at [facility] for GI bleed.

## 2022-09-18 NOTE — ED Provider Notes (Signed)
[facility] Provider Note    Event Date/Time   First MD Initiated Contact with Patient 09/18/22 2256     (approximate)   History   Chest Pain   HPI Melissa Colon is a 62 y.o. female with extensive chronic medical history including prior CVA with persistent residual right-sided deficits including slurred speech and decreased mobility of her right arm.  She presents for evaluation of acute onset epigastric pain.  She said this occurred at rest about an hour after she ate while she was not doing anything in particular at home.  She said it was very strong and painful.  It has almost completely gone away at this point but she still feels a little bit.  It was not accompanied with nausea or vomiting.  It does not radiate through to her back.  She says she still has a gallbladder.  She does not take anything regularly for acid reflux but sometimes takes Tums.  She has been compliant with her regular medication regimen.     Physical Exam   Triage Vital Signs: ED Triage Vitals  Encounter Vitals Group     BP 09/18/22 2031 (!) 141/80     Systolic BP Percentile --      Diastolic BP Percentile --      Pulse Rate 09/18/22 2031 71     Resp 09/18/22 2031 18     Temp 09/18/22 2031 (!) 97.5 F (36.4 C)     Temp Source 09/18/22 2031 Oral     SpO2 09/18/22 2031 98 %     Weight 09/18/22 2030 70.2 kg (154 lb 12.2 oz)     Height 09/18/22 2030 1.575 m (5\' 2" )     Head Circumference --      Peak Flow --      Pain Score 09/18/22 2030 5     Pain Loc --      Pain Education --      Exclude from Growth Chart --     Most recent vital signs: Vitals:   09/19/22 0200 09/19/22 0330  BP: 135/64 (!) 142/73  Pulse: 78 89  Resp:  18  Temp:  98.2 F (36.8 C)  SpO2: 100% 99%    General: Awake, Chronically ill but not in acute distress at this time. CV:  Good peripheral perfusion.  Normal heart sounds.  Regular rate and rhythm. Resp:  Normal effort.  no accessory muscle  usage nor intercostal retractions.  Lungs are clear to auscultation. Abd:  No distention.  No tenderness to the epigastrium.  Some mild guarding to the right upper quadrant but no definitive Murphy sign.  No lower abdominal tenderness to palpation.  No pulsatile abdominal mass.   ED Results / Procedures / Treatments   Labs (all labs ordered are listed, but only abnormal results are displayed) Labs Reviewed  CBC - Abnormal; Notable for the following components:      Result Value   WBC 11.0 (*)    Hemoglobin 11.0 (*)    HCT 35.7 (*)    RDW 16.1 (*)    Platelets 453 (*)    All other components within normal limits  LIPASE, BLOOD - Abnormal; Notable for the following components:   Lipase 53 (*)    All other components within normal limits  COMPREHENSIVE METABOLIC PANEL - Abnormal; Notable for the following components:   Potassium 3.2 (*)    All other components within normal limits  TROPONIN I (HIGH SENSITIVITY)  TROPONIN I (  HIGH SENSITIVITY)     EKG  ED ECG REPORT I, Loleta Rose, the attending physician, personally viewed and interpreted this ECG.  Date: 09/18/2022 EKG Time: 20: 34 Rate: 72 Rhythm: normal sinus rhythm QRS Axis: normal Intervals: normal ST/T Wave abnormalities: There is some nonspecific changes but no indication of acute ischemia Narrative Interpretation: no evidence of acute ischemia    RADIOLOGY I viewed and interpreted the patient's 1 view chest x-ray.  No evidence of pneumonia or other acute abnormality.  I also read the radiologist's report, which confirmed no acute findings.   PROCEDURES:  Critical Care performed: No  .1-3 Lead EKG Interpretation  Performed by: Loleta Rose, MD Authorized by: Loleta Rose, MD     Interpretation: normal     ECG rate:  70   ECG rate assessment: normal     Rhythm: sinus rhythm     Ectopy: none     Conduction: normal       IMPRESSION / MDM / ASSESSMENT AND PLAN / ED COURSE  I reviewed the triage  vital signs and the nursing notes.                              Differential diagnosis includes, but is not limited to, acid reflux, biliary colic, atypical ACS presentation, SBO/ileus.  Patient's presentation is most consistent with acute presentation with potential threat to life or bodily function.  Labs/studies ordered: Right upper quadrant ultrasound, 1 view chest x-ray, high-sensitivity troponin, CMP, lipase, CBC.  Interventions/Medications given:  Medications  pantoprazole (PROTONIX) EC tablet 40 mg (has no administration in time range)  alum & mag hydroxide-simeth (MAALOX/MYLANTA) 200-200-20 MG/5ML suspension 30 mL (30 mLs Oral Given 09/18/22 2337)  iohexol (OMNIPAQUE) 350 MG/ML injection 100 mL (100 mLs Intravenous Contrast Given 09/19/22 0231)    (Note:  hospital course my include additional interventions and/or labs/studies not listed above.)   Vital signs are stable within normal limits.  Physical exam is generally reassuring but she does have some apparent right upper quadrant tenderness, not tenderness in the epigastrium, and no periumbilical or lower abdominal tenderness.  Of note, lipase is slightly elevated at 53, but this is such a minimal elevation above the upper limit of normal that I doubt it represents acute pancreatitis.  Additionally, the history of present illness, with acute onset of pain that lasted a little while and then has gotten better on its own is more suggestive of biliary colic than pancreatitis.  I will provide medications as listed above to see if this helps with her discomfort which is very minimal right now, and we will further assess with a right upper quadrant ultrasound.  Patient agrees with the plan.  The patient is on the cardiac monitor to evaluate for evidence of arrhythmia and/or significant heart rate changes.   Clinical Course as of 09/19/22 0414  Wed Sep 19, 2022  0205 US ABDOMEN LIMITED RUQ (LIVER/GB) I viewed and interpreted the  patient's right upper quadrant ultrasound.  I see no evidence of gallbladder disease.  The patient's kidney function is normal.  Given her age and comorbidities, with unexplained abdominal and lower chest pain, I will proceed with CTA chest to rule out pulmonary embolism and CT of the abdomen and pelvis to evaluate for the possibility of acute or emergent abnormality leading to her symptoms. [CF]  0258 I viewed and interpreted the patient's CT of the abdomen and pelvis as well as the CTA  chest.  I do not see any evidence of any acute abnormality such as an infectious process or SBO in the abdomen, and her CTA chest is negative for PE or pneumonia. [CF]  0330 I had an extensive conversation with the patient and with her brother who called by phone after the patient gave me permission to speak to him.  He had many questions about her evaluation and we discussed her history over the last few months as well.  I provided reassurance to both of them that although I do not have a specific explanation for her discomfort tonight, she has had a thorough evaluation with no evidence of an acute or emergent condition requiring hospitalization.  Although I did initially consider hospitalization given her comorbidities, there is no evidence that she would benefit from a specific treatment or additional inpatient observation.  They have some degree of home health and follow-up available to them and both of them agree to follow-up with her regular doctor.  I gave my usual and customary return precautions. [CF]    Clinical Course User Index [CF] Loleta Rose, MD     FINAL CLINICAL IMPRESSION(S) / ED DIAGNOSES   Final diagnoses:  Epigastric pain     Rx / DC Orders   ED Discharge Orders     None        Note:  This document was prepared using Dragon voice recognition software and may include unintentional dictation errors.   Loleta Rose, MD 09/19/22 [phone]

## 2022-09-19 ENCOUNTER — Emergency Department: Payer: Medicaid Other

## 2022-09-19 LAB — TROPONIN I (HIGH SENSITIVITY): Troponin I (High Sensitivity): 3 ng/L (ref <18)

## 2022-09-19 MED ORDER — IOHEXOL 350 MG/ML SOLN
100.0000 mL | Freq: Once | INTRAVENOUS | Status: AC | PRN
  Administered 2022-09-19: 100 mL via INTRAVENOUS

## 2022-09-19 NOTE — ED Notes (Signed)
[facility] was called, spoke with rep Dorene Grebe and arranged transport. [facility] picked pt up at 4:52am to transport to home.

## 2022-09-19 NOTE — Discharge Instructions (Signed)
Your workup in the Emergency Department today was reassuring.  We did not find any specific abnormalities.  We recommend you drink plenty of fluids, take your regular medications and/or any new ones prescribed today, and follow up with the doctor(s) listed in these documents as recommended.  Return to the Emergency Department if you develop new or worsening symptoms that concern you.  

## 2022-09-19 NOTE — ED Notes (Signed)
Ultrasound at bedside

## 2022-10-04 ENCOUNTER — Emergency Department
Admission: EM | Admit: 2022-10-04 | Discharge: 2022-10-05 | Disposition: A | Discharge status: Home / Self Care | Payer: Medicaid Other | Attending: Emergency Medicine | Admitting: Emergency Medicine

## 2022-10-04 ENCOUNTER — Other Ambulatory Visit: Payer: Self-pay

## 2022-10-04 ENCOUNTER — Emergency Department: Payer: Medicaid Other

## 2022-10-04 DIAGNOSIS — Z20822 Contact with and (suspected) exposure to covid-19: Secondary | ICD-10-CM | POA: Diagnosis not present

## 2022-10-04 DIAGNOSIS — I6782 Cerebral ischemia: Secondary | ICD-10-CM | POA: Diagnosis not present

## 2022-10-04 DIAGNOSIS — R079 Chest pain, unspecified: Secondary | ICD-10-CM | POA: Diagnosis not present

## 2022-10-04 DIAGNOSIS — R413 Other amnesia: Secondary | ICD-10-CM | POA: Insufficient documentation

## 2022-10-04 DIAGNOSIS — R410 Disorientation, unspecified: Secondary | ICD-10-CM | POA: Diagnosis not present

## 2022-10-04 DIAGNOSIS — I6789 Other cerebrovascular disease: Secondary | ICD-10-CM | POA: Insufficient documentation

## 2022-10-04 DIAGNOSIS — R1084 Generalized abdominal pain: Secondary | ICD-10-CM | POA: Insufficient documentation

## 2022-10-04 DIAGNOSIS — Z8673 Personal history of transient ischemic attack (TIA), and cerebral infarction without residual deficits: Secondary | ICD-10-CM | POA: Insufficient documentation

## 2022-10-04 DIAGNOSIS — R4182 Altered mental status, unspecified: Secondary | ICD-10-CM | POA: Diagnosis present

## 2022-10-04 LAB — CBC
HCT: 34.6 % — ABNORMAL LOW (ref 36.0–46.0)
Hemoglobin: 10.8 g/dL — ABNORMAL LOW (ref 12.0–15.0)
MCH: 27.4 pg (ref 26.0–34.0)
MCHC: 31.2 g/dL (ref 30.0–36.0)
MCV: 87.8 fL (ref 80.0–100.0)
Platelets: 267 10*3/uL (ref 150–400)
RBC: 3.94 MIL/uL (ref 3.87–5.11)
RDW: 15.7 % — ABNORMAL HIGH (ref 11.5–15.5)
WBC: 7.4 10*3/uL (ref 4.0–10.5)
nRBC: 0 % (ref 0.0–0.2)

## 2022-10-04 LAB — COMPREHENSIVE METABOLIC PANEL
ALT: 26 U/L (ref 0–44)
AST: 20 U/L (ref 15–41)
Albumin: 3.4 g/dL — ABNORMAL LOW (ref 3.5–5.0)
Alkaline Phosphatase: 119 U/L (ref 38–126)
Anion gap: 9 (ref 5–15)
BUN: 8 mg/dL (ref 6–20)
CO2: 24 mmol/L (ref 22–32)
Calcium: 8.7 mg/dL — ABNORMAL LOW (ref 8.9–10.3)
Chloride: 106 mmol/L (ref 98–111)
Creatinine, Ser: 0.63 mg/dL (ref 0.44–1.00)
GFR, Estimated: >60 mL/min (ref >60)
Glucose, Bld: 104 mg/dL — ABNORMAL HIGH (ref 70–99)
Potassium: 3.4 mmol/L — ABNORMAL LOW (ref 3.5–5.1)
Sodium: 139 mmol/L (ref 135–145)
Total Bilirubin: 0.6 mg/dL (ref 0.3–1.2)
Total Protein: 6.9 g/dL (ref 6.5–8.1)

## 2022-10-04 LAB — CBG MONITORING, ED: Glucose-Capillary: 86 mg/dL (ref 70–99)

## 2022-10-04 MED ORDER — LACTATED RINGERS IV BOLUS
1000.0000 mL | Freq: Once | INTRAVENOUS | Status: AC
  Administered 2022-10-04: 1000 mL via INTRAVENOUS

## 2022-10-04 NOTE — ED Notes (Signed)
PER provider Jessup, pt is NOT  a code stroke

## 2022-10-04 NOTE — ED Triage Notes (Signed)
Pt BIB EMS for AMS, per EMS call for stroke. Per family baseline right sided deficits from previous strokes. Family reported to EMS pt has been lethargic and AMS since 1200pm today.  Family also reports worsening right sided facial droop. Provider Jessup at bedside on arrival for stroke eval.

## 2022-10-04 NOTE — ED Provider Notes (Signed)
[facility] Provider Note    Event Date/Time   First MD Initiated Contact with Patient 10/04/22 2300     (approximate)   History   Altered Mental Status   HPI  Melissa Colon is a 62 y.o. female who presents to the ED for evaluation of Altered Mental Status   Review of medical DC summary from 8/29.  History of a left thalamic hemorrhagic stroke 2021, right PCA stroke 2024.  History of GI bleeding due to AVMs.  Patient presents to the ED from home via EMS for evaluation of intermittent altered mentation and complaints of chest pain earlier.  History is provided by the brother over the phone.  Patient is unable to provide any relevant history.  She has no complaints, denies any pain and she does not know why she is here.  Brother tells me that throughout the day today she has been in and out of confusion.  Earlier she was reporting chest discomfort but not consistently.  Also reports that she was frequently trying to void but unable to do so, concerns for kidney stone or possibly urinary infection.  When I obtained further history at the bedside from [place] when he arrives towards the end of patient's stay, reports concern for short-term memory loss that is progressively worsened in a chronic timeframe, particularly since her last admission in the past few weeks from the most recent stroke.   Physical Exam   Triage Vital Signs: ED Triage Vitals  Encounter Vitals Group     BP 10/04/22 2253 139/64     Systolic BP Percentile --      Diastolic BP Percentile --      Pulse Rate 10/04/22 2253 62     Resp 10/04/22 2253 12     Temp 10/04/22 2255 (!) 97.5 F (36.4 C)     Temp Source 10/04/22 2255 Oral     SpO2 10/04/22 2250 99 %     Weight --      Height --      Head Circumference --      Peak Flow --      Pain Score 10/04/22 2253 0     Pain Loc --      Pain Education --      Exclude from Growth Chart --     Most recent vital signs: Vitals:    10/05/22 0100 10/05/22 0130  BP: (!) 140/73 137/70  Pulse: (!) 52 (!) 54  Resp: (!) 21 (!) 22  Temp:    SpO2: 100% 100%    General: Awake, no distress.  Opens her eyes and follows basic commands, she is disoriented.  Chronic right-sided weakness is present, left side remains intact and she follows commands on this left side. CV:  Good peripheral perfusion.  Resp:  Normal effort.  Abd:  No distention.  Diffuse and mild tenderness without peritoneal features. MSK:  No deformity noted.  Neuro:  No focal deficits appreciated. Other:     ED Results / Procedures / Treatments   Labs (all labs ordered are listed, but only abnormal results are displayed) Labs Reviewed  COMPREHENSIVE METABOLIC PANEL - Abnormal; Notable for the following components:      Result Value   Potassium 3.4 (*)    Glucose, Bld 104 (*)    Calcium 8.7 (*)    Albumin 3.4 (*)    All other components within normal limits  CBC - Abnormal; Notable for the following components:   Hemoglobin 10.8 (*)  HCT 34.6 (*)    RDW 15.7 (*)    All other components within normal limits  URINALYSIS, ROUTINE W REFLEX MICROSCOPIC - Abnormal; Notable for the following components:   Color, Urine YELLOW (*)    APPearance HAZY (*)    Leukocytes,Ua TRACE (*)    All other components within normal limits  SARS CORONAVIRUS 2 BY RT PCR  CBG MONITORING, ED  TROPONIN I (HIGH SENSITIVITY)  TROPONIN I (HIGH SENSITIVITY)    EKG Sinus rhythm with a rate of 64 bpm.  Normal axis and intervals.  No clear signs of acute ischemia.  RADIOLOGY CT head interpreted by me without evidence of acute intracranial pathology   Official radiology report(s): MR BRAIN WO CONTRAST  Result Date: 10/05/2022 CLINICAL DATA:  62 year old female with a history of prior strokes, baseline right side deficit. Right PCA infarct in July. Increased altered mental status and lethargy since noon. EXAM: MRI HEAD WITHOUT CONTRAST TECHNIQUE: Multiplanar, multiecho  pulse sequences of the brain and surrounding structures were obtained without intravenous contrast. COMPARISON:  Head CT yesterday.  Brain MRI 09/04/2022. FINDINGS: Brain: Fading diffusion abnormality in the right occipital lobe since last month. Increased T2 and FLAIR hyperintensity there but compatible with developing encephalomalacia. No mass effect. But there is increased although mild hemosiderin in that territory on SWI. No new restricted diffusion to suggest acute infarction. Occasional T2 shine through on diffusion. Multifocal chronic encephalomalacia, chronic left brainstem Wallerian degeneration, patchy in chronic bilateral cerebral white matter T2 and FLAIR hyperintensity. Extensive chronic right cerebellar infarct with hemosiderin. Chronic lacunar infarcts in the left thalamus and brainstem with hemosiderin. And fairly numerous chronic microhemorrhages scattered elsewhere in the brain. No midline shift, mass effect, evidence of mass lesion, ventriculomegaly, extra-axial collection or acute intracranial hemorrhage. Cervicomedullary junction and pituitary are within normal limits. Vascular: Major intracranial vascular flow voids are stable. Skull and upper cervical spine: Negative for age visible cervical spine. Visualized bone marrow signal is within normal limits. Sinuses/Orbits: Stable, negative. Other: Mastoids remain well aerated. Negative visible internal auditory structures, scalp and face. IMPRESSION: 1. No acute intracranial abnormality. 2. Evolving right occipital lobe infarct since July with increasing encephalomalacia, and mild hemosiderin. 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 Electronically Signed   By: Odessa Fleming M.D.   On: 10/05/2022 04:18   CT Renal Stone Study  Result Date: 10/05/2022 CLINICAL DATA:  Abdominal pain EXAM: CT ABDOMEN AND PELVIS WITHOUT CONTRAST TECHNIQUE: Multidetector CT imaging of the abdomen and pelvis was performed  following the standard protocol without IV contrast. RADIATION DOSE REDUCTION: This exam was performed according to the departmental dose-optimization program which includes automated exposure control, adjustment of the mA and/or kV according to patient size and/or use of iterative reconstruction technique. COMPARISON:  09/19/2022 FINDINGS: Lower chest: No acute abnormality. Hepatobiliary: No focal liver abnormality is seen. No gallstones, gallbladder wall thickening, or biliary dilatation. Pancreas: Cystic lesion is again seen in the mid body of the pancreas stable from the recent exam. Spleen: Normal in size without focal abnormality. Adrenals/Urinary Tract: Adrenal glands are within normal limits. Kidneys are well visualized without renal calculi or obstructive changes. Renal cysts are noted stable from the prior study. No follow-up is recommended. Bladder is decompressed. Stomach/Bowel: Inspissated barium is noted within the appendix. Colon shows no obstructive or inflammatory changes. Small bowel and stomach are within normal limits. Vascular/Lymphatic: Aortic atherosclerosis. No enlarged abdominal or pelvic lymph nodes. Reproductive: Uterus and bilateral adnexa are unremarkable.  Other: No abdominal wall hernia or abnormality. No abdominopelvic ascites. Musculoskeletal: No acute or significant osseous findings. IMPRESSION: No acute abnormality noted. Stable pancreatic cystic lesion. Electronically Signed   By: Alcide Clever M.D.   On: 10/05/2022 00:26   CT HEAD WO CONTRAST ( )  Result Date: 10/05/2022 CLINICAL DATA:  Altered mental status EXAM: CT HEAD WITHOUT CONTRAST TECHNIQUE: Contiguous axial images were obtained from the base of the skull through the vertex without intravenous contrast. RADIATION DOSE REDUCTION: This exam was performed according to the departmental dose-optimization program which includes automated exposure control, adjustment of the mA and/or kV according to patient size and/or use  of iterative reconstruction technique. COMPARISON:  09/05/2022 FINDINGS: Brain: Chronic atrophic changes are noted. Extensive right cerebellar infarct is again seen. Dystrophic calcifications are noted in the cerebellum as well as in the occipital lobes bilaterally. No acute hemorrhage, acute infarction or space-occupying mass lesion is noted. Chronic white matter ischemic changes are noted. Vascular: No hyperdense vessel or unexpected calcification. Skull: Postsurgical changes are noted related to prior suboccipital craniotomy. Sinuses/Orbits: No acute finding. Other: None. IMPRESSION: Chronic changes similar to that seen on the prior exam. No acute abnormality noted. Electronically Signed   By: Alcide Clever M.D.   On: 10/05/2022 00:12   DG Chest Portable 1 View  Result Date: 10/04/2022 CLINICAL DATA:  Evaluate for infiltrate.  Right-sided deficits. EXAM: PORTABLE CHEST 1 VIEW COMPARISON:  None Available. FINDINGS: The heart size and mediastinal contours are within normal limits. Both lungs are clear. The visualized skeletal structures are unremarkable. IMPRESSION: No active disease. Electronically Signed   By: Darliss Cheney M.D.   On: 10/04/2022 23:57    PROCEDURES and INTERVENTIONS:  .1-3 Lead EKG Interpretation  Performed by: Delton Prairie, MD Authorized by: Delton Prairie, MD     Interpretation: normal     ECG rate:  60   ECG rate assessment: normal     Rhythm: sinus rhythm     Ectopy: none     Conduction: normal     Medications  lactated ringers bolus 1,000 mL (1,000 mLs Intravenous New Bag/Given 10/04/22 2346)     IMPRESSION / MDM / ASSESSMENT AND PLAN / ED COURSE  I reviewed the triage vital signs and the nursing notes.  Differential diagnosis includes, but is not limited to, viral syndrome, UTI, sepsis, stroke or ICH, dehydration or AKI  {Patient presents with symptoms of an acute illness or injury that is potentially life-threatening.  Patient with history of multiple recent  strokes presents with familial concerns for confusion and memory loss, without evidence of acute pathology and suitable for outpatient management.  Chronic right-sided deficits without evidence of acute neurologic deficits.  No reported seizures at home or activity here.  Reassuring workup with normal CBC, metabolic panel and urine.  2 negative troponins, clear CT head.  CT renal study without any signs of ureteral stones.  MRI brain without signs of acute stroke.  Overall very reassuring workup and I suspect her symptoms are a consequence of her recent and recurrent strokes.  Family is comfortable with her going home in their care, which I think is reasonable.  Clinical Course as of 10/05/22 0446  Thu Oct 04, 2022  2320 I call her brother, [name]. Today she had a lot of confusion, in and out incoherence. Chest pain earlier. Her husband and son at home. Son is primary caregiver. [name] is mPOA. Also reports voiding has been different, kidney stone? [DS]  Fri Oct 05, 2022  1610 Reassessed the patient.  Clinically the same.  I call [name] back to update. He is Adult nurse. Can come see her later this morning, possibly take home [DS]  0432 Reassessed. [name] now at the bedside. We discuss extensive workup and possible etiologies of symptoms.  [DS]    Clinical Course User Index [DS] Delton Prairie, MD     FINAL CLINICAL IMPRESSION(S) / ED DIAGNOSES   Final diagnoses:  Confusion  Short-term memory loss     Rx / DC Orders   ED Discharge Orders     None        Note:  This document was prepared using Dragon voice recognition software and may include unintentional dictation errors.   Delton Prairie, MD 10/05/22 [phone]

## 2022-10-04 NOTE — ED Notes (Signed)
Patient transported to CT 

## 2022-10-05 ENCOUNTER — Emergency Department: Payer: Medicaid Other

## 2022-10-05 LAB — URINALYSIS, ROUTINE W REFLEX MICROSCOPIC
Bacteria, UA: NONE SEEN
Bilirubin Urine: NEGATIVE
Glucose, UA: NEGATIVE mg/dL
Hgb urine dipstick: NEGATIVE
Ketones, ur: NEGATIVE mg/dL
Nitrite: NEGATIVE
Protein, ur: NEGATIVE mg/dL
Specific Gravity, Urine: 1.02 (ref 1.005–1.030)
pH: 5 (ref 5.0–8.0)

## 2022-10-05 LAB — TROPONIN I (HIGH SENSITIVITY)
Troponin I (High Sensitivity): 3 ng/L (ref <18)
Troponin I (High Sensitivity): 2 ng/L (ref <18)

## 2022-10-05 LAB — SARS CORONAVIRUS 2 BY RT PCR: SARS Coronavirus 2 by RT PCR: NEGATIVE

## 2022-10-05 NOTE — ED Notes (Signed)
Called for [facility] to transport home awaiting arrival

## 2022-10-05 NOTE — ED Notes (Signed)
PT placed in new depends brief. Pt son enroute to home to await EMS transport. MD provided pt son with discharge instructions and education.

## 2022-10-05 NOTE — Discharge Instructions (Signed)
Use Tylenol for pain and fevers.  Up to 1000 mg per dose, up to 4 times per day.  Do not take more than 4000 mg of Tylenol/acetaminophen within 24 hours..  

## 2022-10-05 NOTE — ED Notes (Signed)
Pt stable at time of transport. Pt safely transferred to EMS stretcher. Pt belongings in possession of pt.

## 2022-10-05 NOTE — ED Notes (Signed)
Pt placed on bedpan

## 2022-10-05 NOTE — ED Notes (Signed)
Patient transported to MRI 

## 2022-10-19 ENCOUNTER — Ambulatory Visit: Payer: Medicaid Other | Admitting: Internal Medicine

## 2022-11-06 ENCOUNTER — Inpatient Hospital Stay: Payer: MEDICAID | Admitting: Neurology

## 2023-02-22 ENCOUNTER — Other Ambulatory Visit: Payer: Self-pay

## 2023-02-22 DIAGNOSIS — X58XXXA Exposure to other specified factors, initial encounter: Secondary | ICD-10-CM | POA: Insufficient documentation

## 2023-02-22 DIAGNOSIS — R55 Syncope and collapse: Secondary | ICD-10-CM | POA: Insufficient documentation

## 2023-02-22 DIAGNOSIS — Z20822 Contact with and (suspected) exposure to covid-19: Secondary | ICD-10-CM | POA: Insufficient documentation

## 2023-02-22 DIAGNOSIS — S8265XA Nondisplaced fracture of lateral malleolus of left fibula, initial encounter for closed fracture: Secondary | ICD-10-CM | POA: Diagnosis not present

## 2023-02-22 DIAGNOSIS — S8990XA Unspecified injury of unspecified lower leg, initial encounter: Secondary | ICD-10-CM | POA: Diagnosis present

## 2023-02-22 LAB — CBC
HCT: 34.7 % — ABNORMAL LOW (ref 36.0–46.0)
Hemoglobin: 10.6 g/dL — ABNORMAL LOW (ref 12.0–15.0)
MCH: 24.6 pg — ABNORMAL LOW (ref 26.0–34.0)
MCHC: 30.5 g/dL (ref 30.0–36.0)
MCV: 80.5 fL (ref 80.0–100.0)
Platelets: 295 10*3/uL (ref 150–400)
RBC: 4.31 MIL/uL (ref 3.87–5.11)
RDW: 16.3 % — ABNORMAL HIGH (ref 11.5–15.5)
WBC: 8.2 10*3/uL (ref 4.0–10.5)
nRBC: 0 % (ref 0.0–0.2)

## 2023-02-22 LAB — BASIC METABOLIC PANEL
Anion gap: 13 (ref 5–15)
BUN: 9 mg/dL (ref 6–20)
CO2: 23 mmol/L (ref 22–32)
Calcium: 8.6 mg/dL — ABNORMAL LOW (ref 8.9–10.3)
Chloride: 104 mmol/L (ref 98–111)
Creatinine, Ser: 0.7 mg/dL (ref 0.44–1.00)
GFR, Estimated: >60 mL/min (ref >60)
Glucose, Bld: 137 mg/dL — ABNORMAL HIGH (ref 70–99)
Potassium: 3.7 mmol/L (ref 3.5–5.1)
Sodium: 140 mmol/L (ref 135–145)

## 2023-02-22 LAB — CBG MONITORING, ED: Glucose-Capillary: 137 mg/dL — ABNORMAL HIGH (ref 70–99)

## 2023-02-22 NOTE — ED Triage Notes (Signed)
 Pt presents to ER via EMS from home. Per EMS family reported pt had a syncopal episode. Pt took sleeping aid med, pt sleepy  Right side weakness deficit from previous strokes.  20 Lt hand. CBg 137 99% Co2 36 16 111/58 65.

## 2023-02-23 ENCOUNTER — Emergency Department: Payer: Medicaid Other

## 2023-02-23 ENCOUNTER — Emergency Department
Admission: EM | Admit: 2023-02-23 | Discharge: 2023-02-23 | Disposition: A | Discharge status: Home / Self Care | Payer: Medicaid Other | Attending: Emergency Medicine | Admitting: Emergency Medicine

## 2023-02-23 DIAGNOSIS — R55 Syncope and collapse: Secondary | ICD-10-CM

## 2023-02-23 DIAGNOSIS — S8265XA Nondisplaced fracture of lateral malleolus of left fibula, initial encounter for closed fracture: Secondary | ICD-10-CM

## 2023-02-23 LAB — TROPONIN I (HIGH SENSITIVITY)
Troponin I (High Sensitivity): 4 ng/L (ref <18)
Troponin I (High Sensitivity): 3 ng/L (ref <18)

## 2023-02-23 LAB — URINALYSIS, ROUTINE W REFLEX MICROSCOPIC
Bilirubin Urine: NEGATIVE
Glucose, UA: NEGATIVE mg/dL
Hgb urine dipstick: NEGATIVE
Ketones, ur: NEGATIVE mg/dL
Leukocytes,Ua: NEGATIVE
Nitrite: NEGATIVE
Protein, ur: NEGATIVE mg/dL
Specific Gravity, Urine: 1.01 (ref 1.005–1.030)
pH: 8 (ref 5.0–8.0)

## 2023-02-23 LAB — ETHANOL: Alcohol, Ethyl (B): <10 mg/dL (ref <10)

## 2023-02-23 LAB — RESP PANEL BY RT-PCR (RSV, FLU A&B, COVID)  RVPGX2
Influenza A by PCR: NEGATIVE
Influenza B by PCR: NEGATIVE
Resp Syncytial Virus by PCR: NEGATIVE
SARS Coronavirus 2 by RT PCR: NEGATIVE

## 2023-02-23 NOTE — ED Notes (Signed)
 Attempted to call Melissa Colon for discharge. No answer at this time

## 2023-02-23 NOTE — ED Notes (Signed)
 Unable to obtain cam boot at this time. Provider aware

## 2023-02-23 NOTE — ED Notes (Signed)
 Melissa Colon (MPOA) [phone]

## 2023-02-23 NOTE — ED Notes (Signed)
 Attempted to call Reymundo Caulk for ride. No answer at this time. Message left

## 2023-02-23 NOTE — Discharge Instructions (Addendum)
 Fortunately your testing in the emergency dept did not show any emergency conditions to account for your passing out episode earlier today.  You did injure your left ankle and the x-ray showed a small broken bone on the outside portion of your left ankle.  Keep the walking boot on until you are able to see Dr. Tobie for follow-up appointment.Take acetaminophen  650 mg and ibuprofen 400 mg every 6 hours for pain.  Take with food.  Thank you for choosing us  for your health care today!  Please see your primary doctor this week for a follow up appointment.   If you have any new, worsening, or unexpected symptoms call your doctor right away or come back to the emergency department for reevaluation.  It was my pleasure to care for you today.   Ginnie EDISON Cyrena, MD

## 2023-02-23 NOTE — ED Provider Notes (Addendum)
 [facility] Provider Note    Event Date/Time   First MD Initiated Contact with Patient 02/23/23 0103     (approximate)   History   Loss of Consciousness   HPI  Melissa Colon is a 63 y.o. female   Past medical history of prior strokes with residual right-sided motor deficits, needs a lot of assistance at home, mostly bedbound, wheelchair-bound, here with a syncopal episode witnessed by family members earlier this evening.  Over the past several weeks has had a mild cough and has been complaining with mild dysuria.  Then today was getting assistance on bedside commode sat down to urinate and then became lightheaded slumped forward and passed out briefly, became nauseated, and while being situated back in bed appeared to be confused.  This resolved and she is back to normal now.  She did take a sleeping pill to help with sleep and when I evaluated her past midnight, her only complaint was that she was sleepy.  She also complains that during transport she twisted her left ankle and there is pain there as well as at the IV site in her left hand.  Independent Historian contributed to assessment above: Her medical power of attorney brother-in-law Selinda spoke with me over the phone to give collateral information as above    Physical Exam   Triage Vital Signs: ED Triage Vitals  Encounter Vitals Group     BP 02/22/23 2255 120/70     Systolic BP Percentile --      Diastolic BP Percentile --      Pulse Rate 02/22/23 2255 62     Resp 02/22/23 2255 16     Temp 02/22/23 2253 (!) 97.1 F (36.2 C)     Temp Source 02/22/23 2255 Rectal     SpO2 02/22/23 2255 100 %     Weight 02/22/23 2256 154 lb 12.2 oz (70.2 kg)     Height 02/22/23 2256 5' 2 (1.575 m)     Head Circumference --      Peak Flow --      Pain Score 02/22/23 2256 0     Pain Loc --      Pain Education --      Exclude from Growth Chart --     Most recent vital signs: Vitals:   02/23/23 0249  02/23/23 0300  BP:  124/77  Pulse:  73  Resp:  17  Temp: (!) 97.4 F (36.3 C)   SpO2:  98%    General: Awake, no distress.  CV:  Good peripheral perfusion.  Resp:  Normal effort.  Abd:  No distention.  Other:  Right-sided deficits consistent with old stroke.  Somnolent but wakeful upon verbal stimulus and answering all questions appropriately.  Tenderness to palpation under the lateral malleolus of the left ankle.  No obvious signs of head trauma, neck supple with full range of motion.   ED Results / Procedures / Treatments   Labs (all labs ordered are listed, but only abnormal results are displayed) Labs Reviewed  BASIC METABOLIC PANEL - Abnormal; Notable for the following components:      Result Value   Glucose, Bld 137 (*)    Calcium  8.6 (*)    All other components within normal limits  CBC - Abnormal; Notable for the following components:   Hemoglobin 10.6 (*)    HCT 34.7 (*)    MCH 24.6 (*)    RDW 16.3 (*)    All other components  within normal limits  URINALYSIS, ROUTINE W REFLEX MICROSCOPIC - Abnormal; Notable for the following components:   Color, Urine YELLOW (*)    APPearance HAZY (*)    All other components within normal limits  CBG MONITORING, ED - Abnormal; Notable for the following components:   Glucose-Capillary 137 (*)    All other components within normal limits  RESP PANEL BY RT-PCR (RSV, FLU A&B, COVID)  [id number]  ETHANOL  TROPONIN I (HIGH SENSITIVITY)  TROPONIN I (HIGH SENSITIVITY)     I ordered and reviewed the above labs they are notable for cell counts electrolytes unremarkable.  EKG  ED ECG REPORT I, Ginnie Shams, the attending physician, personally viewed and interpreted this ECG.   Date: 02/23/2023  EKG Time: 2257  Rate: 63  Rhythm: sinus  Axis: nl  Intervals:none  ST&T Change: no stemi    RADIOLOGY I independently reviewed and interpreted CT scan of the head see no obvious bleeding or midline shift I also reviewed radiologist's  formal read.   PROCEDURES:  Critical Care performed: No  Procedures   MEDICATIONS ORDERED IN ED: Medications - No data to display   IMPRESSION / MDM / ASSESSMENT AND PLAN / ED COURSE  I reviewed the triage vital signs and the nursing notes.                                Patient's presentation is most consistent with acute presentation with potential threat to life or bodily function.  Differential diagnosis includes, but is not limited to, vasovagal syncope, cardiogenic syncope, dysrhythmia, metabolic derangement, infection, stroke, head injury   The patient is on the cardiac monitor to evaluate for evidence of arrhythmia and/or significant heart rate changes.  MDM:    Is a patient with what appears to be a vasovagal syncopal episode while using the toilet.  Appears to be back at baseline now with residual effects of old stroke otherwise no new neurologic deficits.  EKG unremarkable, lab testing unremarkable, and head imaging unremarkable.  Will get x-ray of her ankle given her injury while transferring, and get a chest x-ray given her cough, check viral testing, check urinalysis given her dysuria.  Ultimately since she is in her baseline state of health now, stable, she will be discharged home after workup as above is complete.  -- Nondisplaced acute fracture of the lateral malleolus of the left ankle.  Placed in cam boot and given contact information for orthopedist for follow-up.  Otherwise workup unremarkable patient stable to be discharged with follow-up as above.       FINAL CLINICAL IMPRESSION(S) / ED DIAGNOSES   Final diagnoses:  Syncope and collapse  Closed nondisplaced fracture of lateral malleolus of left fibula, initial encounter     Rx / DC Orders   ED Discharge Orders     None        Note:  This document was prepared using Dragon voice recognition software and may include unintentional dictation errors.    Shams Ginnie, MD 02/23/23 9747     Shams Ginnie, MD 02/23/23 [phone]

## 2023-02-23 NOTE — ED Notes (Signed)
 Waiting on Cam boot for d/c

## 2023-02-23 NOTE — ED Notes (Signed)
 Attempted to call ride. No answer

## 2023-12-05 ENCOUNTER — Other Ambulatory Visit: Payer: Self-pay

## 2023-12-05 ENCOUNTER — Emergency Department (HOSPITAL_COMMUNITY)

## 2023-12-05 ENCOUNTER — Emergency Department (HOSPITAL_COMMUNITY)
Admission: EM | Admit: 2023-12-05 | Discharge: 2023-12-05 | Disposition: A | Discharge status: Home / Self Care | Attending: Emergency Medicine | Admitting: Emergency Medicine

## 2023-12-05 DIAGNOSIS — Z993 Dependence on wheelchair: Secondary | ICD-10-CM

## 2023-12-05 DIAGNOSIS — I6930 Unspecified sequelae of cerebral infarction: Secondary | ICD-10-CM | POA: Diagnosis not present

## 2023-12-05 DIAGNOSIS — R519 Headache, unspecified: Secondary | ICD-10-CM | POA: Diagnosis not present

## 2023-12-05 DIAGNOSIS — R531 Weakness: Secondary | ICD-10-CM | POA: Diagnosis not present

## 2023-12-05 DIAGNOSIS — R29810 Facial weakness: Secondary | ICD-10-CM | POA: Diagnosis not present

## 2023-12-05 DIAGNOSIS — R4701 Aphasia: Secondary | ICD-10-CM | POA: Insufficient documentation

## 2023-12-05 DIAGNOSIS — R4789 Other speech disturbances: Secondary | ICD-10-CM | POA: Diagnosis not present

## 2023-12-05 LAB — DIFFERENTIAL
Abs Immature Granulocytes: 0.02 K/uL (ref 0.00–0.07)
Basophils Absolute: 0.1 K/uL (ref 0.0–0.1)
Basophils Relative: 1 %
Eosinophils Absolute: 0.3 K/uL (ref 0.0–0.5)
Eosinophils Relative: 4 %
Immature Granulocytes: 0 %
Lymphocytes Relative: 29 %
Lymphs Abs: 2.5 K/uL (ref 0.7–4.0)
Monocytes Absolute: 0.5 K/uL (ref 0.1–1.0)
Monocytes Relative: 6 %
Neutro Abs: 5.2 K/uL (ref 1.7–7.7)
Neutrophils Relative %: 60 %

## 2023-12-05 LAB — CBC
HCT: 42 % (ref 36.0–46.0)
Hemoglobin: 13.5 g/dL (ref 12.0–15.0)
MCH: 27.9 pg (ref 26.0–34.0)
MCHC: 32.1 g/dL (ref 30.0–36.0)
MCV: 86.8 fL (ref 80.0–100.0)
Platelets: 262 K/uL (ref 150–400)
RBC: 4.84 MIL/uL (ref 3.87–5.11)
RDW: 13.8 % (ref 11.5–15.5)
WBC: 8.6 K/uL (ref 4.0–10.5)
nRBC: 0 % (ref 0.0–0.2)

## 2023-12-05 LAB — COMPREHENSIVE METABOLIC PANEL WITH GFR
ALT: 24 U/L (ref 0–44)
AST: 22 U/L (ref 15–41)
Albumin: 3.6 g/dL (ref 3.5–5.0)
Alkaline Phosphatase: 110 U/L (ref 38–126)
Anion gap: 15 (ref 5–15)
BUN: 5 mg/dL — ABNORMAL LOW (ref 8–23)
CO2: 22 mmol/L (ref 22–32)
Calcium: 9.8 mg/dL (ref 8.9–10.3)
Chloride: 104 mmol/L (ref 98–111)
Creatinine, Ser: 0.76 mg/dL (ref 0.44–1.00)
GFR, Estimated: >60 mL/min (ref >60)
Glucose, Bld: 107 mg/dL — ABNORMAL HIGH (ref 70–99)
Potassium: 4 mmol/L (ref 3.5–5.1)
Sodium: 141 mmol/L (ref 135–145)
Total Bilirubin: 0.2 mg/dL (ref 0.0–1.2)
Total Protein: 7.4 g/dL (ref 6.5–8.1)

## 2023-12-05 LAB — I-STAT CHEM 8, ED
BUN: 3 mg/dL — ABNORMAL LOW (ref 8–23)
Calcium, Ion: 1.2 mmol/L (ref 1.15–1.40)
Chloride: 106 mmol/L (ref 98–111)
Creatinine, Ser: 0.6 mg/dL (ref 0.44–1.00)
Glucose, Bld: 96 mg/dL (ref 70–99)
HCT: 40 % (ref 36.0–46.0)
Hemoglobin: 13.6 g/dL (ref 12.0–15.0)
Potassium: 3.6 mmol/L (ref 3.5–5.1)
Sodium: 141 mmol/L (ref 135–145)
TCO2: 25 mmol/L (ref 22–32)

## 2023-12-05 LAB — APTT: aPTT: 30 s (ref 24–36)

## 2023-12-05 LAB — ETHANOL: Alcohol, Ethyl (B): <15 mg/dL (ref <15)

## 2023-12-05 LAB — CBG MONITORING, ED: Glucose-Capillary: 104 mg/dL — ABNORMAL HIGH (ref 70–99)

## 2023-12-05 LAB — PROTIME-INR
INR: 0.9 (ref 0.8–1.2)
Prothrombin Time: 12.6 s (ref 11.4–15.2)

## 2023-12-05 MED ORDER — SODIUM CHLORIDE 0.9% FLUSH
3.0000 mL | Freq: Once | INTRAVENOUS | Status: AC
  Administered 2023-12-05: 3 mL via INTRAVENOUS

## 2023-12-05 MED ORDER — IOHEXOL 350 MG/ML SOLN
75.0000 mL | Freq: Once | INTRAVENOUS | Status: AC | PRN
  Administered 2023-12-05: 75 mL via INTRAVENOUS

## 2023-12-05 NOTE — Discharge Instructions (Addendum)
 The symptoms today could have been from a complex migraine or even possible seizure but ultimately it is important that she sees neurology.  Someone from the office should call you for a follow-up appointment.  If you do not hear anything you can call the office and tell them you were seen in the emergency room and referred.  Continue all current medication at this time.  Today there was no sign of infection or problems with your kidneys, blood sugar or electrolytes.

## 2023-12-05 NOTE — Consult Note (Addendum)
 NEUROLOGY CONSULT NOTE   Date of service: December 05, 2023 Patient Name: Melissa Colon MRN:  [id number] DOB:  Dec 24, 1960 Chief Complaint: Code Stroke Requesting Provider: No att. providers found  History of Present Illness  Melissa Colon is a 63 y.o. 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  Patient son noted that she was generally more weak yesterday and had a hard time getting up off the toilet.  However at 3 PM today she had an abrupt onset of right sided facial droop and aphasia with more notable weakness in her right leg.  The family called EMS.  They deny falls, medication changes or recent illness.  On exam at the bridge she is having some confusion and slurred speech.  Her vision deficit appears to be from her prior strokes.  LKW: 1500 (on further conversation with family last known well is unclear but likely 11/19) Modified rankin score: 4-Needs assistance to walk and tend to bodily needs IV Thrombolysis: No, last known well yesterday  EVT: No, no LVO   NIHSS components Score: Comment  1a Level of Conscious 0[x]  1[]  2[]  3[]      1b LOC Questions 0[]  1[x]  2[]       1c LOC Commands 0[x]  1[]  2[]       2 Best Gaze 0[x]  1[]  2[]       3 Visual 0[]  1[]  2[x]  3[]      4 Facial Palsy 0[]  1[]  2[x]  3[]      5a Motor Arm - left 0[x]  1[]  2[]  3[]  4[]  UN[]    5b Motor Arm - Right 0[]  1[x]  2[]  3[]  4[]  UN[]    6a Motor Leg - Left 0[x]  1[]  2[]  3[]  4[]  UN[]    6b Motor Leg - Right 0[]  1[]  2[x]  3[]  4[]  UN[]    7 Limb Ataxia 0[x]  1[]  2[]  UN[]      8 Sensory 0[]  1[x]  2[]  UN[]      9 Best Language 0[x]  1[]  2[]  3[]      10 Dysarthria 0[]  1[]  2[x]  UN[]      11 Extinct. and Inattention 0[x]   1[]  2[]       TOTAL:11       ROS  Comprehensive ROS performed and pertinent positives documented in HPI   Past History   Past Medical History:  Diagnosis Date   Alcohol dependence (HCC)    H/O brain tumor    Smoker     Past Surgical History:  Procedure Laterality Date   COLONOSCOPY WITH PROPOFOL  N/A 09/10/2022   Procedure: COLONOSCOPY WITH PROPOFOL ;  Surgeon: San Sandor GAILS, DO;  Location: [facility];  Service: Gastroenterology;  Laterality: N/A;   CRANIOTOMY FOR TUMOR     ENTEROSCOPY N/A 09/10/2022   Procedure: ENTEROSCOPY;  Surgeon: San Sandor GAILS, DO;  Location: [facility];  Service: Gastroenterology;  Laterality: N/A;   ESOPHAGOGASTRODUODENOSCOPY (EGD) WITH PROPOFOL  N/A 08/12/2022   Procedure: ESOPHAGOGASTRODUODENOSCOPY (EGD) WITH PROPOFOL ;  Surgeon: Rollin Dover, MD;  Location: [facility];  Service: Gastroenterology;  Laterality: N/A;   HEMOSTASIS CLIP PLACEMENT  09/10/2022   Procedure: HEMOSTASIS CLIP PLACEMENT;  Surgeon: San Sandor GAILS, DO;  Location: [facility];  Service: Gastroenterology;;   HOT HEMOSTASIS  09/10/2022   Procedure: HOT HEMOSTASIS (ARGON PLASMA COAGULATION/BICAP);  Surgeon: San Sandor GAILS, DO;  Location: [facility];  Service: Gastroenterology;;    Family History: Family History  Problem Relation Age of Onset   Diabetes Mother     Social History  reports that she has been smoking cigarettes. She has never used smokeless tobacco. She reports current alcohol use of about 8.0 standard drinks of alcohol per week. She reports that she does not currently use drugs.  No Known Allergies  Medications   Current Facility-Administered Medications:    sodium chloride  flush (NS) 0.9 % injection 3 mL, 3 mL, Intravenous, Once, Kammerer, Megan L, DO  Current Outpatient Medications:    atorvastatin  (LIPITOR ) 80 MG tablet, Take 1 tablet (80 mg total) by mouth daily., Disp: 30 tablet, Rfl: 0   diclofenac  Sodium (VOLTAREN ) 1 % GEL, Apply 2 g  topically 4 (four) times daily., Disp: 100 g, Rfl: 0   folic acid  (FOLVITE ) 1 MG tablet, Take 1 tablet (1 mg total) by mouth daily., Disp: 30 tablet, Rfl: 0   Multiple Vitamin (MULTIVITAMIN WITH MINERALS) TABS tablet, Take 1 tablet by mouth daily., Disp: , Rfl:    pantoprazole  (PROTONIX ) 40 MG tablet, Take 1 tablet (40 mg total) by mouth 2 (two) times daily before a meal., Disp: 60 tablet, Rfl: 2   polyethylene glycol (MIRALAX  / GLYCOLAX ) 17 g packet, Take 17 g by mouth daily., Disp: , Rfl:    ticagrelor  (BRILINTA ) 90 MG TABS tablet, Take 1 tablet (90 mg total) by mouth 2 (two) times daily., Disp: 60 tablet, Rfl: 0  Vitals   Vitals:   12/05/23 1600  Weight: 73.6 kg    Body mass index is 29.68 kg/m.   Physical Exam   Constitutional: Appears well-developed and well-nourished.  Psych: Affect appropriate to situation.  Eyes: No scleral injection.  HENT: No OP obstruction.  Head: Normocephalic.  Cardiovascular: Normal rate and regular rhythm.  Respiratory: Effort normal, non-labored breathing.  GI: Soft.  No distension. There is no tenderness.  Skin: WDI.   Neurologic Examination    Neuro: Mental Status: Patient is awake, alert, oriented to self and age, states it is July.  Knows she is at the hospital. Unable to give a clear and coherent past medical history.  Speech is dysarthric, no aphasia but occasional word hesitancy.  Nonfluent speech Cranial Nerves: II: Left hemianopia.  Pupils are equal, round, and reactive to light.   III,IV, VI: Initially she appeared to have a right gaze preference, however she does cross midline V: Facial sensation is symmetric to temperature VII: Right facial droop VIII: Hearing is intact to voice X: Palate elevates symmetrically XI: Shoulder shrug is symmetric. XII: Tongue protrudes midline without atrophy or fasciculations.  Motor: Tone increased in right hand. Bulk is normal.  Right upper extremity 4/5 with drift.  Weakness of right grip and  intrinsic hand muscles Right lower extremity 3/5 with drift Sensory: Sensation is symmetric to light touch and temperature in the arms and legs. No extinction to DSS present.  Cerebellar: FNF intact on the left  Labs/Imaging/Neurodiagnostic studies   CBC: No results for input(s): WBC, NEUTROABS, HGB, HCT, MCV, PLT in the last 168 hours. Basic Metabolic Panel:  Lab Results  Component Value Date   NA 140 02/22/2023   K 3.7 02/22/2023   CO2 23 02/22/2023   GLUCOSE 137 (H) 02/22/2023   BUN 9 02/22/2023   CREATININE 0.70 02/22/2023   CALCIUM  8.6 (L) 02/22/2023   GFRNONAA >60 02/22/2023   Lipid Panel:  Lab Results  Component Value Date  LDLCALC 37 09/06/2022   HgbA1c:  Lab Results  Component Value Date   HGBA1C 5.4 09/06/2022   Urine Drug Screen:     Component Value Date/Time   LABOPIA NONE DETECTED 09/05/2022 1953   COCAINSCRNUR NONE DETECTED 09/05/2022 1953   LABBENZ NONE DETECTED 09/05/2022 1953   AMPHETMU NONE DETECTED 09/05/2022 1953   THCU NONE DETECTED 09/05/2022 1953   LABBARB NONE DETECTED 09/05/2022 1953    Alcohol Level     Component Value Date/Time   ETH <10 02/23/2023 0138   INR  Lab Results  Component Value Date   INR 1.1 09/05/2022   APTT  Lab Results  Component Value Date   APTT 26 09/05/2022   AED levels: No results found for: PHENYTOIN, ZONISAMIDE, LAMOTRIGINE, LEVETIRACETA  CT Head without contrast(Personally reviewed): No acute intracranial abnormality. ASPECTS of 10. Advanced cerebral atrophy and chronic ischemia with multiple old infarcts as above.  CT angio Head and Neck with contrast(Personally reviewed): No LVO on personal review by Dr. Rosemarie  MRI Brain(Personally reviewed): Pending   ASSESSMENT   Shanikka Wonders is a 63 y.o. 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  Her son however stated that he noticed left-sided weakness yesterday.  She is outside time window for thrombolysis and clinical presentation and CT angiogram do not suggest LVO   Recommend admission for workup for etiology of her neurological worsening -new subacute stroke versus worsening of old deficits .check MRI brain and stroke work up if MRI positive.   RECOMMENDATIONS  -MRI brain wo  -Recommend admit to the hospital service for stroke workup if MRI is positive ______________________________________________________________________  Signed, Jorene Last, NP [facility]  I have personally obtained history,examined this patient, reviewed notes, independently viewed imaging studies, participated in medical decision making and plan of care.ROS completed by me personally and pertinent positives fully documented  I have made any additions or clarifications directly to the above note. Agree with note above.  Patient with prior history of left thalamic hemorrhage and right occipital infarct with residual deficits who was wheelchair-bound presents with abrupt onset of worsening speech and right-sided weakness this afternoon however the send states that patient had worsening left-sided deficits yesterday evening.  Recommend admission to medical service for further evaluation of etiology of neurological deficits.  CT head and CT angiogram seem unremarkable.  Long discussion with patient as well as with son over the phone and answered questions.  Discussed with Dr. Doretha ER MD.   I personally spent a total of 55 minutes in the care of the patient today including getting/reviewing separately obtained history, performing a  medically appropriate exam/evaluation, counseling and educating, placing orders, referring and communicating with other health care professionals, documenting clinical information in the EHR, independently interpreting results, and coordinating care.        Eather Rosemarie, MD Medical Director [facility] Pager: [phone] 12/05/2023 5:49 PM

## 2023-12-05 NOTE — Code Documentation (Signed)
 Stroke Response Nurse Documentation Code Documentation  Melissa Colon is a 63 y.o. female arriving to [facility]  via [facility] on 12/05/2023 with past medical hx of CVA, ICH, GI bleed. On No antithrombotic. Code stroke was activated by EMS.   Patient from home where she was LKW at 1500 and now complaining of worsened rt sided weakness.  Stroke team at the bedside on patient arrival. Labs drawn and patient cleared for CT by EDP. Patient to CT with team. NIHSS 11, see documentation for details and code stroke times. Patient with disoriented, left hemianopia, right facial droop, right arm weakness, right leg weakness, right decreased sensation, and dysarthria  on exam. The following imaging was completed:  CT Head and CTA. Patient is not a candidate for IV Thrombolytic due to H/O ICH, unclear LKW. Patient is not a candidate for IR due to LVO negative on advanced imaging.   Care Plan:    No acute treatment/TIA alert: q2h x 12 hours NIHSS & VS, then q4h      Bedside handoff with ED RN Lauraine.    Adryan Druckenmiller Livengood  Stroke Response RN

## 2023-12-05 NOTE — ED Provider Notes (Addendum)
 [place] EMERGENCY DEPARTMENT AT [facility] Provider Note   CSN: [id number] Arrival date & time: 12/05/23  1629  An emergency department physician performed an initial assessment on this suspected stroke patient at 1630.  Patient presents with: Code Stroke   Melissa Colon is a 63 y.o. female.   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  Patient was seen immediately upon arrival to the emergency room by the stroke team and myself.  It was reported by patient's son that yesterday he noticed she was more generally weak having a hard time getting out of her wheelchair when she normally is able to do that easily however at 3:15 PM today she had abrupt onset of right sided facial droop and aphasia with more notable weakness in her right leg.  Family called 911 when this happened.  There was no reported trauma and they did not report that patient had any recent medication changes or recent illness.  Paramedics report the patient's blood sugar was normal, blood pressure has been stable in the 140s and patient has had persistent symptoms.  She initially had some gaze preference to the right but that has resolved but she still seems to be having word finding difficulty.  The history is provided by the EMS personnel and medical records.       Prior to Admission medications   Medication Sig Start Date End Date Taking? Authorizing Provider  atorvastatin  (LIPITOR ) 80 MG tablet Take 1 tablet (80 mg total) by mouth daily. Patient not taking: Reported on 12/05/2023 08/16/22   Angiulli, Daniel J, PA-C  diclofenac  Sodium (VOLTAREN ) 1 % GEL Apply 2 g  topically 4 (four) times daily. Patient not taking: Reported on 12/05/2023 08/16/22   Pegge Toribio PARAS, PA-C  folic acid  (FOLVITE ) 1 MG tablet Take 1 tablet (1 mg total) by mouth daily. Patient not taking: Reported on 12/05/2023 08/16/22   Angiulli, Daniel J, PA-C  Multiple Vitamin (MULTIVITAMIN WITH MINERALS) TABS tablet Take 1 tablet by mouth daily. Patient not taking: Reported on 12/05/2023 08/16/22   Angiulli, Toribio PARAS, PA-C  pantoprazole  (PROTONIX ) 40 MG tablet Take 1 tablet (40 mg total) by mouth 2 (two) times daily before a meal. Patient not taking: Reported on 12/05/2023 09/13/22   Singh, Prashant K, MD  polyethylene glycol (MIRALAX  / GLYCOLAX ) 17 g packet Take 17 g by mouth daily. Patient not taking: Reported on 12/05/2023 08/16/22   Angiulli, Toribio PARAS, PA-C  ticagrelor  (BRILINTA ) 90 MG TABS tablet Take 1 tablet (90 mg total) by mouth 2 (two) times daily. Patient not taking: Reported on 12/05/2023 08/16/22   Pegge Toribio PARAS, PA-C    Allergies: Patient has no known allergies.    Review of Systems  Updated Vital Signs BP (!) 149/91 (BP Location: Left Arm)   Pulse 86   Temp 97.9 F (36.6 C) (Oral)   Resp 20   Ht 5' 2 (1.575 m)   Wt 73.6 kg   LMP  (LMP Unknown)   SpO2 100%   BMI 29.68 kg/m   Physical Exam Vitals and nursing note reviewed.  Constitutional:      General: She is not in acute distress.  Appearance: She is well-developed.  HENT:     Head: Normocephalic and atraumatic.  Eyes:     General: Visual field deficit present.     Pupils: Pupils are equal, round, and reactive to light.  Cardiovascular:     Rate and Rhythm: Normal rate and regular rhythm.     Heart sounds: Normal heart sounds. No murmur heard.    No friction rub.  Pulmonary:     Effort: Pulmonary effort is normal.     Breath sounds: Normal breath sounds. No wheezing or rales.  Abdominal:     General: Bowel sounds are normal. There is no distension.     Palpations: Abdomen is soft.     Tenderness:  There is no abdominal tenderness. There is no guarding or rebound.  Musculoskeletal:        General: No tenderness. Normal range of motion.     Right lower leg: No edema.     Left lower leg: No edema.     Comments: No edema  Skin:    General: Skin is warm and dry.     Findings: No rash.  Neurological:     Mental Status: She is alert.     Cranial Nerves: Dysarthria and facial asymmetry present. No cranial nerve deficit.     Sensory: Sensation is intact.     Comments: Left-sided visual deficit.  Word finding difficulty.  Right sided facial droop.  4/5 strength in the right lower extremity compared to the left with right pronator drift.  Also 4 out of 5 strength in the right upper extremity.  Psychiatric:        Behavior: Behavior normal.     (all labs ordered are listed, but only abnormal results are displayed) Labs Reviewed  COMPREHENSIVE METABOLIC PANEL WITH GFR - Abnormal; Notable for the following components:      Result Value   Glucose, Bld 107 (*)    BUN 5 (*)    All other components within normal limits  I-STAT CHEM 8, ED - Abnormal; Notable for the following components:   BUN 3 (*)    All other components within normal limits  CBG MONITORING, ED - Abnormal; Notable for the following components:   Glucose-Capillary 104 (*)    All other components within normal limits  PROTIME-INR  APTT  CBC  DIFFERENTIAL  ETHANOL  URINALYSIS, W/ REFLEX TO CULTURE (INFECTION SUSPECTED)    EKG: None  Radiology: MR BRAIN WO CONTRAST Result Date: 12/05/2023 EXAM: MRI BRAIN WITHOUT CONTRAST 12/05/2023 06:08:14 PM TECHNIQUE: Multiplanar multisequence MRI of the head/brain was performed without the administration of intravenous contrast. COMPARISON: CT head from earlier today. MRI head from 10/05/2022 CLINICAL HISTORY: Neuro deficit, acute, stroke suspected FINDINGS: BRAIN AND VENTRICLES: No acute infarct. No acute intracranial hemorrhage. No mass. No midline shift. No hydrocephalus.  Bilateral parieto-occipital and right cerebellar infarcts. Moderate T2 hyperintensities in the white matter, compatible with chronic microvascular ischemic disease. Remote left thalamic infarct. Normal flow voids. ORBITS: No acute abnormality. SINUSES AND MASTOIDS: No acute abnormality. BONES AND SOFT TISSUES: Normal marrow signal. No acute soft tissue abnormality. IMPRESSION: 1. No acute findings. 2. Multiple remote infarcts. Electronically signed by: Gilmore Molt MD 12/05/2023 07:26 PM EST RP Workstation: [id number]   CT ANGIO HEAD NECK W WO CM (CODE STROKE) Result Date: 12/05/2023 EXAM: CTA Head and Neck with Intravenous Contrast. CT Head without Contrast. CLINICAL HISTORY: Neuro deficit, acute, stroke suspected. TECHNIQUE: Axial CTA images of the head and neck performed with intravenous contrast.  MIP reconstructed images were created and reviewed. Axial computed tomography images of the head/brain performed without intravenous contrast. Note: Per PQRS, the description of internal carotid artery percent stenosis, including 0 percent or normal exam, is based on [place] [employer] Symptomatic Carotid Endarterectomy Trial (NASCET) criteria. Dose reduction technique was used including one or more of the following: automated exposure control, adjustment of mA and kV according to patient size, and/or iterative reconstruction. CONTRAST: With and without. 75 mL (iohexol  (OMNIPAQUE ) 350 MG/ML injection 75 mL IOHEXOL  350 MG/ML SOLN). COMPARISON: CTA head and neck 09/05/2022 and comparison earlier same day CT head. FINDINGS: CT HEAD: BRAIN: No acute intraparenchymal hemorrhage. No mass lesion. No CT evidence for acute territorial infarct. No midline shift or extra-axial collection. Postsurgical changes of right suboccipital craniectomy. VENTRICLES: No hydrocephalus. ORBITS: The orbits are unremarkable. SINUSES AND MASTOIDS: The paranasal sinuses and mastoid air cells are clear. CTA NECK: COMMON CAROTID ARTERIES: The  right common carotid artery is patent from the origin to the skull base with minimal atherosclerosis at the right carotid bifurcation. No hemodynamically significant stenosis. The left common carotid artery is patent from the origin to the skull base with no hemodynamically significant stenosis. No dissection or occlusion. INTERNAL CAROTID ARTERIES: The intracranial internal carotid arteries are patent bilaterally. Atherosclerosis of the bilateral carotid siphons. Mild stenosis of the bilateral cavernous ICA, slightly more pronounced on the right. No dissection or occlusion. VERTEBRAL ARTERIES: The right vertebral artery is dominant. The vertebral arteries are patent from the origins to the intracranial segments. There is similar occlusion of the mid/distal V4 segment of the nondominant left vertebral artery. Additional moderate stenosis at the origin of the nondominant left vertebral artery. There is no evidence of acute vertebral artery occlusion. CTA HEAD: ANTERIOR CEREBRAL ARTERIES: The anterior cerebral arteries are patent bilaterally. There is moderate irregularity of the left A1 segment without high grade stenosis. No occlusion. No aneurysm. MIDDLE CEREBRAL ARTERIES: Middle cerebral arteries are patent bilaterally. There is mild stenosis of the proximal right M1 segment. There is atherosclerotic irregularity of multiple right MCA branches, overall similar to prior. Atherosclerotic irregularity of multiple left MCA branches, also similar to prior. No occlusion. No aneurysm. POSTERIOR CEREBRAL ARTERIES: There is severe stenosis at the P1 segment of the right PCA which is new from prior. Additional severe stenosis of the P2 segment of the right PCA with similar appearance of flow gap at this level suggestive of short segment occlusion. Similar stenosis of the distal P2 and P3 segments of the right PCA. There is irregularity and moderate to severe stenosis of the P2 segment and P3 segment of the left PCA, similar  to prior. The Superior Cerebellar Arteries are patent bilaterally. No occlusion. No aneurysm. BASILAR ARTERY: The basilar artery is patent. No significant stenosis. No occlusion. No aneurysm. OTHER: SOFT TISSUES: Prominence of the right thyroid lobe with multiple nodules. The most prominent nodule measures at least 2.1 cm in diameter. Recommend nonemergent correlation with thyroid ultrasound if not previously performed. Emphysema. Centrilobular emphysema. Edentulous maxilla and mandible. No acute finding. No masses or lymphadenopathy. BONES: Degenerative changes in the visualized spine. Chronic irregularity of the C7 superior endplate. No acute osseous abnormality. IMPRESSION: 1. No acute large vessel occlusion. 2. Severe stenosis at the P1 segment of the right PCA, new from prior. Recommend correlation with MRI. 3. Severe stenosis of the P2 segment of the right PCA with similar appearance of short segment occlusion, similar to prior. 4. Irregularity and moderate to severe stenosis of the  P2 and P3 segments of the left PCA, similar to prior. 5. Moderate stenosis at the origin of the nondominant left vertebral artery and occlusion of the mid/distal V4 segment of the nondominant left vertebral artery, similar to prior. 6. Emphysema. 7. Right thyroid lobe nodularity with a dominant nodule measuring at least 2.1 cm, recommend nonemergent thyroid ultrasound. Electronically signed by: Donnice Mania MD 12/05/2023 06:03 PM EST RP Workstation: [id number]   CT HEAD CODE STROKE WO CONTRAST Result Date: 12/05/2023 EXAM: CT HEAD WITHOUT 12/05/2023 04:43:21 PM TECHNIQUE: CT of the head was performed without the administration of intravenous contrast. Automated exposure control, iterative reconstruction, and/or weight based adjustment of the mA/kV was utilized to reduce the radiation dose to as low as reasonably achievable. COMPARISON: Head CT 02/23/2023 and MRI 10/05/2022. CLINICAL HISTORY: Neuro deficit, acute, stroke  suspected. FINDINGS: BRAIN AND VENTRICLES: There is no evidence of an acute infarct, intracranial hemorrhage, mass, midline shift, hydrocephalus, or extra-axial fluid collection. There is advanced cerebral atrophy. Chronic bilateral parieto-occipital infarcts are noted, right larger than left. A large chronic right cerebellar infarct is also unchanged. Dystrophic calcifications are again noted medially in both occipital lobes as well as in the left cerebellar hemisphere. There is an unchanged chronic lacunar infarct in the left basal ganglia. A background of moderate chronic small vessel ischemia is noted in the cerebral white matter. Calcified atherosclerosis at the skull base. ORBITS: No acute abnormality. SINUSES AND MASTOIDS: Minimal mucosal thickening in the paranasal sinuses. Clear mastoid air cells. SOFT TISSUES AND SKULL: Right suboccipital craniectomy. No acute skull fracture. No acute soft tissue abnormality. [place] Stroke Program Early CT Score (ASPECTS) ----- Ganglionic (caudate, IC, lentiform nucleus, insula, M1-M3): 7 Supraganglionic (M4-M6): 3 Total: 10 IMPRESSION: 1. No acute intracranial abnormality. ASPECTS of 10. 2. Advanced cerebral atrophy and chronic ischemia with multiple old infarcts as above. 3. These results were communicated to Dr. Rosemarie at 4:50 PM on 12/05/2023 by secure text page via the [facility] messaging system. Electronically signed by: Dasie Hamburg MD 12/05/2023 04:51 PM EST RP Workstation: [id number]     Procedures   Medications Ordered in the ED  sodium chloride  flush (NS) 0.9 % injection 3 mL (3 mLs Intravenous Given 12/05/23 1711)  iohexol  (OMNIPAQUE ) 350 MG/ML injection 75 mL (75 mLs Intravenous Contrast Given 12/05/23 1657)                                    Medical Decision Making Amount and/or Complexity of Data Reviewed Independent Historian: EMS External Data Reviewed: notes. Labs: ordered. Decision-making details documented in ED Course. Radiology: ordered  and independent interpretation performed. Decision-making details documented in ED Course. ECG/medicine tests: ordered and independent interpretation performed. Decision-making details documented in ED Course.  Risk Decision regarding hospitalization.   Pt with multiple medical problems and comorbidities and presenting today with a complaint that caries a high risk for morbidity and mortality.  Here today as a code stroke.  Initially patient was reported as normal until 315 today when she abruptly developed right sided facial droop and aphasia.  Upon arrival to the emergency room patient continues to have aphasia and right sided facial droop.  Dr. Rosemarie with the neurology service has spoken with the patient's son and at this time due to patient's prior history, poor baseline functional status, prior brain tumor she is not a TNKase candidate.  Also based on imaging she does not require thrombectomy.  Recommended  medical workup and they will follow as well as a CTA and MRI of the brain. I independently interpreted patient's labs.  CBC, i-STAT Chem-8, CMP, EtOH, coags are all within normal limits, I have independently visualized and interpreted pt's images today.  CT of the brain without evidence of acute bleed.  Radiology reports CTA without large vessel occlusion but severe stenosis which is new from prior and they recommend an MRI.   Patient's MRI is negative for acute abnormalities.  Spoke with Dr. Khaliqdina with neurology and at this time patient does not require admission.  On speaking with the patient her speech is now back to baseline she is awake and alert and reports she feels much better.  She reports this all started with a severe headache which is now resolved.  Speaking with neurology they report this could be a complex migraine however also possibility for seizure but would need an outpatient workup.  Called patient's brother who also lives with her Selinda and discussed all this with him.  At  this time patient can be transported home by [facility] as she is otherwise wheelchair and bedbound.  Will get an ambulatory neurology referral.      Final diagnoses:  Acute intractable headache, unspecified headache type  Aphasia    ED Discharge Orders          Ordered    Ambulatory referral to Neurology       Comments: An appointment is requested in approximately: 2 weeks   12/05/23 2112               Doretha Folks, MD 12/05/23 TYRA Doretha Folks, MD 12/05/23 2114

## 2023-12-05 NOTE — ED Notes (Signed)
 When you have time; brother, Selinda Music [phone] would like an update

## 2023-12-05 NOTE — ED Notes (Signed)
 Notified patients brother in law, [place]. Made him aware patient is being d/c and was in route to home. He confirmed address and stated he will be waiting for her to arrive.

## 2023-12-05 NOTE — ED Triage Notes (Signed)
 Pt. BIB [facility] from home with c/o stroke-like symptoms; her LKW was 1500, but after the NP talked with the family, it seems to have been yesterday. The family noticed some R sided weakness, and some deficits to the R side. The pt. Has L visual field deficits, but that is normal. Per [facility] the pt. Displayed some expressive aphasia.

## 2023-12-16 ENCOUNTER — Encounter: Payer: Self-pay | Admitting: Neurology

## 2023-12-16 ENCOUNTER — Ambulatory Visit: Admitting: Neurology
